# Patient Record
Sex: Female | Born: 1965
Health system: Southern US, Community
[De-identification: ages and names within clinical notes are randomized; demographics above are authoritative.]

## PROBLEM LIST (undated history)

## (undated) DIAGNOSIS — Z8041 Family history of malignant neoplasm of ovary: Secondary | ICD-10-CM

## (undated) DIAGNOSIS — Z800 Family history of malignant neoplasm of digestive organs: Secondary | ICD-10-CM

## (undated) DIAGNOSIS — M1990 Unspecified osteoarthritis, unspecified site: Secondary | ICD-10-CM

## (undated) DIAGNOSIS — Z801 Family history of malignant neoplasm of trachea, bronchus and lung: Secondary | ICD-10-CM

## (undated) DIAGNOSIS — F419 Anxiety disorder, unspecified: Secondary | ICD-10-CM

## (undated) DIAGNOSIS — Z803 Family history of malignant neoplasm of breast: Secondary | ICD-10-CM

## (undated) DIAGNOSIS — Z806 Family history of leukemia: Secondary | ICD-10-CM

## (undated) DIAGNOSIS — R7303 Prediabetes: Secondary | ICD-10-CM

## (undated) DIAGNOSIS — F319 Bipolar disorder, unspecified: Secondary | ICD-10-CM

## (undated) DIAGNOSIS — J45909 Unspecified asthma, uncomplicated: Secondary | ICD-10-CM

## (undated) HISTORY — DX: Family history of malignant neoplasm of breast: Z80.3

## (undated) HISTORY — PX: CHOLECYSTECTOMY: SHX55

## (undated) HISTORY — DX: Family history of malignant neoplasm of ovary: Z80.41

## (undated) HISTORY — DX: Family history of leukemia: Z80.6

## (undated) HISTORY — DX: Family history of malignant neoplasm of digestive organs: Z80.0

## (undated) HISTORY — DX: Family history of malignant neoplasm of trachea, bronchus and lung: Z80.1

---

## 1990-03-17 HISTORY — PX: ANKLE FRACTURE SURGERY: SHX122

## 2007-02-02 ENCOUNTER — Emergency Department: Payer: Self-pay | Admitting: Emergency Medicine

## 2007-05-04 ENCOUNTER — Ambulatory Visit: Payer: Self-pay | Admitting: Family Medicine

## 2007-05-04 LAB — HM MAMMOGRAPHY

## 2009-04-12 ENCOUNTER — Emergency Department: Payer: Self-pay | Admitting: Emergency Medicine

## 2010-03-09 ENCOUNTER — Emergency Department: Payer: Self-pay | Admitting: Emergency Medicine

## 2012-08-12 LAB — HM PAP SMEAR: HM Pap smear: NEGATIVE

## 2013-01-31 ENCOUNTER — Emergency Department: Payer: Self-pay | Admitting: Emergency Medicine

## 2013-01-31 LAB — BASIC METABOLIC PANEL
Anion Gap: 7 (ref 7–16)
BUN: 15 mg/dL (ref 7–18)
Calcium, Total: 9.1 mg/dL (ref 8.5–10.1)
Chloride: 109 mmol/L — ABNORMAL HIGH (ref 98–107)
Co2: 25 mmol/L (ref 21–32)
Creatinine: 0.78 mg/dL (ref 0.60–1.30)
EGFR (African American): >60
EGFR (Non-African Amer.): >60
Glucose: 127 mg/dL — ABNORMAL HIGH (ref 65–99)
Osmolality: 284 (ref 275–301)
Potassium: 4.4 mmol/L (ref 3.5–5.1)
Sodium: 141 mmol/L (ref 136–145)

## 2013-01-31 LAB — CBC
HCT: 40.6 % (ref 35.0–47.0)
HGB: 13.7 g/dL (ref 12.0–16.0)
MCH: 30.8 pg (ref 26.0–34.0)
MCHC: 33.8 g/dL (ref 32.0–36.0)
MCV: 91 fL (ref 80–100)
Platelet: 310 10*3/uL (ref 150–440)
RBC: 4.46 10*6/uL (ref 3.80–5.20)
RDW: 13.4 % (ref 11.5–14.5)
WBC: 11.9 10*3/uL — ABNORMAL HIGH (ref 3.6–11.0)

## 2013-02-19 ENCOUNTER — Emergency Department: Payer: Self-pay | Admitting: Emergency Medicine

## 2013-02-19 LAB — COMPREHENSIVE METABOLIC PANEL
Albumin: 3.7 g/dL (ref 3.4–5.0)
Alkaline Phosphatase: 77 U/L
Anion Gap: 3 — ABNORMAL LOW (ref 7–16)
BUN: 15 mg/dL (ref 7–18)
Bilirubin,Total: 0.3 mg/dL (ref 0.2–1.0)
Calcium, Total: 9.4 mg/dL (ref 8.5–10.1)
Chloride: 109 mmol/L — ABNORMAL HIGH (ref 98–107)
Co2: 28 mmol/L (ref 21–32)
Creatinine: 0.89 mg/dL (ref 0.60–1.30)
EGFR (African American): >60
EGFR (Non-African Amer.): >60
Glucose: 125 mg/dL — ABNORMAL HIGH (ref 65–99)
Osmolality: 282 (ref 275–301)
Potassium: 4.3 mmol/L (ref 3.5–5.1)
SGOT(AST): 22 U/L (ref 15–37)
SGPT (ALT): 24 U/L (ref 12–78)
Sodium: 140 mmol/L (ref 136–145)
Total Protein: 7.3 g/dL (ref 6.4–8.2)

## 2013-02-19 LAB — CBC WITH DIFFERENTIAL/PLATELET
Basophil #: 0.1 10*3/uL (ref 0.0–0.1)
Basophil %: 0.8 %
Eosinophil #: 0.2 10*3/uL (ref 0.0–0.7)
Eosinophil %: 2.4 %
HCT: 39.3 % (ref 35.0–47.0)
HGB: 13.3 g/dL (ref 12.0–16.0)
Lymphocyte #: 1.8 10*3/uL (ref 1.0–3.6)
Lymphocyte %: 20 %
MCH: 30.9 pg (ref 26.0–34.0)
MCHC: 33.8 g/dL (ref 32.0–36.0)
MCV: 91 fL (ref 80–100)
Monocyte #: 0.9 x10 3/mm (ref 0.2–0.9)
Monocyte %: 9.3 %
Neutrophil #: 6.2 10*3/uL (ref 1.4–6.5)
Neutrophil %: 67.5 %
Platelet: 282 10*3/uL (ref 150–440)
RBC: 4.31 10*6/uL (ref 3.80–5.20)
RDW: 13.5 % (ref 11.5–14.5)
WBC: 9.2 10*3/uL (ref 3.6–11.0)

## 2013-10-19 ENCOUNTER — Emergency Department: Payer: Self-pay | Admitting: Emergency Medicine

## 2013-10-19 LAB — CBC
HCT: 39.3 % (ref 35.0–47.0)
HGB: 13.1 g/dL (ref 12.0–16.0)
MCH: 30.8 pg (ref 26.0–34.0)
MCHC: 33.3 g/dL (ref 32.0–36.0)
MCV: 92 fL (ref 80–100)
Platelet: 258 10*3/uL (ref 150–440)
RBC: 4.26 10*6/uL (ref 3.80–5.20)
RDW: 14.1 % (ref 11.5–14.5)
WBC: 6.8 10*3/uL (ref 3.6–11.0)

## 2013-10-19 LAB — BASIC METABOLIC PANEL
Anion Gap: 8 (ref 7–16)
BUN: 12 mg/dL (ref 7–18)
Calcium, Total: 8.4 mg/dL — ABNORMAL LOW (ref 8.5–10.1)
Chloride: 106 mmol/L (ref 98–107)
Co2: 23 mmol/L (ref 21–32)
Creatinine: 0.53 mg/dL — ABNORMAL LOW (ref 0.60–1.30)
EGFR (African American): >60
EGFR (Non-African Amer.): >60
Glucose: 92 mg/dL (ref 65–99)
Osmolality: 273 (ref 275–301)
Potassium: 4.4 mmol/L (ref 3.5–5.1)
Sodium: 137 mmol/L (ref 136–145)

## 2013-10-19 LAB — D-DIMER(ARMC): D-Dimer: 368 ng/ml

## 2013-10-19 LAB — TROPONIN I: Troponin-I: <0.02 ng/mL

## 2014-02-27 ENCOUNTER — Emergency Department: Payer: Self-pay | Admitting: Emergency Medicine

## 2014-09-08 DIAGNOSIS — Z6839 Body mass index (BMI) 39.0-39.9, adult: Secondary | ICD-10-CM

## 2014-09-08 DIAGNOSIS — N920 Excessive and frequent menstruation with regular cycle: Secondary | ICD-10-CM | POA: Insufficient documentation

## 2014-09-08 DIAGNOSIS — F32A Depression, unspecified: Secondary | ICD-10-CM | POA: Insufficient documentation

## 2014-09-08 DIAGNOSIS — J45901 Unspecified asthma with (acute) exacerbation: Secondary | ICD-10-CM | POA: Insufficient documentation

## 2014-09-08 DIAGNOSIS — F419 Anxiety disorder, unspecified: Secondary | ICD-10-CM | POA: Insufficient documentation

## 2014-09-08 DIAGNOSIS — F329 Major depressive disorder, single episode, unspecified: Secondary | ICD-10-CM | POA: Insufficient documentation

## 2014-09-08 DIAGNOSIS — G43909 Migraine, unspecified, not intractable, without status migrainosus: Secondary | ICD-10-CM | POA: Insufficient documentation

## 2014-09-08 DIAGNOSIS — F319 Bipolar disorder, unspecified: Secondary | ICD-10-CM | POA: Insufficient documentation

## 2014-09-15 ENCOUNTER — Other Ambulatory Visit: Payer: Self-pay | Admitting: Family Medicine

## 2014-10-03 ENCOUNTER — Ambulatory Visit (INDEPENDENT_AMBULATORY_CARE_PROVIDER_SITE_OTHER): Payer: BLUE CROSS/BLUE SHIELD | Admitting: Family Medicine

## 2014-10-03 ENCOUNTER — Encounter: Payer: Self-pay | Admitting: Family Medicine

## 2014-10-03 VITALS — BP 120/86 | HR 80 | Temp 98.2°F | Resp 16 | Ht 70.0 in | Wt 262.0 lb

## 2014-10-03 DIAGNOSIS — G43809 Other migraine, not intractable, without status migrainosus: Secondary | ICD-10-CM

## 2014-10-03 DIAGNOSIS — J45901 Unspecified asthma with (acute) exacerbation: Secondary | ICD-10-CM | POA: Diagnosis not present

## 2014-10-03 MED ORDER — KETOROLAC TROMETHAMINE 60 MG/2ML IM SOLN: 60.0000 mg | Freq: Once | INTRAMUSCULAR | Status: AC

## 2014-10-03 MED ORDER — PREDNISONE 10 MG PO TABS: ORAL_TABLET | ORAL | Status: DC

## 2014-10-03 NOTE — Progress Notes (Signed)
Subjective:    Patient ID: Megan Jimenez, female    DOB: [birth year], 49 y.o.   MRN: [id number]  Asthma She complains of difficulty breathing, shortness of breath and wheezing. This is a chronic problem. The problem has been rapidly worsening. Associated symptoms include dyspnea on exertion, headaches (Pt reports having a migraine right now. ), myalgias, rhinorrhea, a sore throat and trouble swallowing. Pertinent negatives include no appetite change, chest pain, ear congestion, ear pain, fever, heartburn, nasal congestion, postnasal drip or sneezing. Her symptoms are alleviated by steroid inhaler and rest (She has also been using her Nebulizer twice a day for the past three days.). She reports minimal improvement on treatment. Her past medical history is significant for asthma.   Also, with migraine. Has been worsening over past few days. Medication has not helped. Shot has helped in the past.  Would like to try this.  Does not have them very frequently.  Maybe two times a month.   Patient Active Problem List   Diagnosis Date Noted  . Anxiety 09/08/2014  . Acute asthma exacerbation 09/08/2014  . 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/08/2014  . Headache, migraine 09/08/2014  . Excess, menstruation 09/08/2014  . Extreme obesity 09/08/2014   Family History  Problem Relation Age of Onset  . COPD Mother   . Hyperlipidemia Mother   . Hyperlipidemia Father   . Leukemia Father   . COPD Father   . Depression Sister     ANXIETY  . Healthy Sister    History   Social History  . Marital Status: Married    Spouse Name: N/A  . Number of Children: 2  . Years of Education: College   Occupational History  .      Full-time   Social History Main Topics  . Smoking status: Former Smoker    Quit date: 03/17/2004  . Smokeless tobacco: Never Used  . Alcohol Use: No  . Drug Use: No  . Sexual Activity: Not on file   Other Topics Concern  . Not on file   Social  History Narrative   Past Surgical History  Procedure Laterality Date  . Cholecystectomy    . Ankle fracture surgery Right 1992   No Known Allergies Previous Medications   ALPRAZOLAM (XANAX) 0.5 MG TABLET    Take by mouth.   CARBAMAZEPINE (EQUETRO) 100 MG CP12 12 HR CAPSULE    Take 400 mg by mouth daily.   FLUTICASONE-SALMETEROL (ADVAIR DISKUS) 250-50 MCG/DOSE AEPB    Inhale into the lungs.   LAMOTRIGINE (LAMICTAL) 200 MG TABLET    Take by mouth.   LURASIDONE (LATUDA) 40 MG TABS TABLET    Take by mouth.   MONTELUKAST (SINGULAIR) 10 MG TABLET    Take by mouth.   VENTOLIN HFA 108 (90 BASE) MCG/ACT INHALER    INHALE ONE PUFF BY MOUTH EVERY 4 HOURS AS NEEDED   BP 120/86 mmHg  Pulse 80  Temp(Src) 98.2 F (36.8 C) (Oral)  Resp 16  Ht 5\' 10"  (1.778 m)  Wt 262 lb (118.842 kg)  BMI 37.59 kg/m2  SpO2 97%  LMP 09/27/2014 (Exact Date)    Review of Systems  Constitutional: Positive for chills, diaphoresis and fatigue. Negative for fever, activity change, appetite change and unexpected weight change.  HENT: Positive for congestion, ear discharge, rhinorrhea, sore throat, tinnitus, trouble swallowing and voice change. Negative for ear pain, hearing loss, nosebleeds, postnasal drip, sinus pressure and sneezing.   Respiratory: Positive for  shortness of breath and wheezing. Negative for apnea, choking, chest tightness and stridor.   Cardiovascular: Positive for dyspnea on exertion. Negative for chest pain, palpitations and leg swelling.  Gastrointestinal: Positive for diarrhea (Had diarrhea yesterday and this morning. ). Negative for heartburn, nausea, vomiting, abdominal pain, constipation, blood in stool, abdominal distention, anal bleeding and rectal pain.  Musculoskeletal: Positive for myalgias.  Neurological: Positive for dizziness and headaches (Pt reports having a migraine right now. ). Negative for light-headedness.       Objective:   Physical Exam  Constitutional: She is oriented to  person, place, and time. She appears well-developed and well-nourished.  Cardiovascular: Normal rate and regular rhythm.   Pulmonary/Chest: Effort normal. She has wheezes. She has rales.  Neurological: She is alert and oriented to person, place, and time.  Psychiatric: She has a normal mood and affect.   BP 120/86 mmHg  Pulse 80  Temp(Src) 98.2 F (36.8 C) (Oral)  Resp 16  Ht 5\' 10"  (1.778 m)  Wt 262 lb (118.842 kg)  BMI 37.59 kg/m2  SpO2 97%  LMP 09/27/2014 (Exact Date)      Assessment & Plan:  1. Acute asthma exacerbation, unspecified asthma severity Worsening. Will treat.  Please call back if condition worsens or does not continue to improve.    - predniSONE (DELTASONE) 10 MG tablet; 6 po for 2 days and then 5 po for 2 days and then 4 po for 2 days and 3 po for 2 days and then 2 po for 2 days and then 1 po for 2 days.  Dispense: 42 tablet; Refill: 0  2. Other migraine without status migrainosus, not intractable Will treat as below. If become more frequent, call for more definitive treatment.  Follow up if headache does not continue to improve.  - ketorolac (TORADOL) injection 60 mg; Inject 2 mLs (60 mg total) into the muscle once.  Margarita Rana, MD

## 2014-10-24 ENCOUNTER — Encounter: Payer: Self-pay | Admitting: Family Medicine

## 2014-11-17 ENCOUNTER — Other Ambulatory Visit: Payer: Self-pay | Admitting: Family Medicine

## 2014-11-17 DIAGNOSIS — J309 Allergic rhinitis, unspecified: Secondary | ICD-10-CM | POA: Insufficient documentation

## 2015-03-16 ENCOUNTER — Encounter: Payer: BLUE CROSS/BLUE SHIELD | Admitting: Family Medicine

## 2015-04-09 ENCOUNTER — Emergency Department: Payer: BLUE CROSS/BLUE SHIELD

## 2015-04-09 ENCOUNTER — Emergency Department
Admission: EM | Admit: 2015-04-09 | Discharge: 2015-04-09 | Disposition: A | Discharge status: Home / Self Care | Payer: BLUE CROSS/BLUE SHIELD | Attending: Emergency Medicine | Admitting: Emergency Medicine

## 2015-04-09 ENCOUNTER — Encounter: Payer: Self-pay | Admitting: Emergency Medicine

## 2015-04-09 DIAGNOSIS — R202 Paresthesia of skin: Secondary | ICD-10-CM | POA: Insufficient documentation

## 2015-04-09 DIAGNOSIS — Z7951 Long term (current) use of inhaled steroids: Secondary | ICD-10-CM | POA: Insufficient documentation

## 2015-04-09 DIAGNOSIS — W108XXA Fall (on) (from) other stairs and steps, initial encounter: Secondary | ICD-10-CM | POA: Insufficient documentation

## 2015-04-09 DIAGNOSIS — S0033XA Contusion of nose, initial encounter: Secondary | ICD-10-CM | POA: Insufficient documentation

## 2015-04-09 DIAGNOSIS — Y998 Other external cause status: Secondary | ICD-10-CM | POA: Diagnosis not present

## 2015-04-09 DIAGNOSIS — Y9389 Activity, other specified: Secondary | ICD-10-CM | POA: Diagnosis not present

## 2015-04-09 DIAGNOSIS — Z79899 Other long term (current) drug therapy: Secondary | ICD-10-CM | POA: Diagnosis not present

## 2015-04-09 DIAGNOSIS — S0081XA Abrasion of other part of head, initial encounter: Secondary | ICD-10-CM | POA: Diagnosis not present

## 2015-04-09 DIAGNOSIS — S199XXA Unspecified injury of neck, initial encounter: Secondary | ICD-10-CM | POA: Diagnosis present

## 2015-04-09 DIAGNOSIS — Z87891 Personal history of nicotine dependence: Secondary | ICD-10-CM | POA: Diagnosis not present

## 2015-04-09 DIAGNOSIS — S134XXA Sprain of ligaments of cervical spine, initial encounter: Secondary | ICD-10-CM | POA: Insufficient documentation

## 2015-04-09 DIAGNOSIS — Y9289 Other specified places as the place of occurrence of the external cause: Secondary | ICD-10-CM | POA: Insufficient documentation

## 2015-04-09 DIAGNOSIS — S139XXA Sprain of joints and ligaments of unspecified parts of neck, initial encounter: Secondary | ICD-10-CM

## 2015-04-09 HISTORY — DX: Unspecified asthma, uncomplicated: J45.909

## 2015-04-09 HISTORY — DX: Bipolar disorder, unspecified: F31.9

## 2015-04-09 MED ORDER — OXYCODONE-ACETAMINOPHEN 5-325 MG PO TABS
1.0000 | ORAL_TABLET | Freq: Once | ORAL | Status: AC
  Administered 2015-04-09: 1 via ORAL
  Filled 2015-04-09: qty 1

## 2015-04-09 MED ORDER — KETOROLAC TROMETHAMINE 30 MG/ML IJ SOLN
30.0000 mg | Freq: Once | INTRAMUSCULAR | Status: AC
  Administered 2015-04-09: 30 mg via INTRAMUSCULAR
  Filled 2015-04-09: qty 1

## 2015-04-09 MED ORDER — NAPROXEN 500 MG PO TABS: 500.0000 mg | ORAL_TABLET | Freq: Two times a day (BID) | ORAL | Status: DC

## 2015-04-09 MED ORDER — OXYCODONE-ACETAMINOPHEN 5-325 MG PO TABS: 1.0000 | ORAL_TABLET | Freq: Four times a day (QID) | ORAL | Status: DC | PRN

## 2015-04-09 MED ORDER — ALPRAZOLAM 0.5 MG PO TABS
1.0000 mg | ORAL_TABLET | Freq: Three times a day (TID) | ORAL | Status: DC | PRN
  Administered 2015-04-09: 1 mg via ORAL
  Filled 2015-04-09: qty 2

## 2015-04-09 NOTE — ED Notes (Signed)
Pt says yesterday morning before church she lost her footing on her steps and fell down 10-15 concrete steps; feels like she may have lost consciousness for a brief moment; presents this am with headache, neck pain and left arm numbness; unable to lift arm; abrasion to right forehead, bridge of nose, under nose and bruising to bottom lip; pt was ambulatory upon arrival; talking in complete coherent sentences; c-collar placed immediately upon arrival

## 2015-04-09 NOTE — ED Notes (Signed)
Taken to MRI via stretcher 

## 2015-04-09 NOTE — ED Notes (Signed)
Back from x-ray and c t  Awaiting results

## 2015-04-09 NOTE — ED Provider Notes (Signed)
[facility] Emergency Department Provider Note  ____________________________________________  Time seen: 7:20 AM  I have reviewed the triage vital signs and the nursing notes.   HISTORY  Chief Complaint Fall; Neck Pain; Headache; and Numbness    HPI Megan Jimenez is a 50 y.o. female who complains of headache left arm paresthesias and left arm weakness along with neck pain after a fall yesterday. She was walking down the steps away from her apartment to go to church in the morning yesterday when she slipped on the steps of following down 10 or 15 steps. She says she was unable to brace her fall and she fell on her head. She may have lost consciousness briefly. She was able to get back up and ambulate afterwards but has pain in the right forehead and around the nose. No other significant injuries. No medical history except for asthma. She is quite sure that this was a trip and fall because her steps are very narrow and she was wearing large boots that made it hard for her to walk on the steps. She had no preceding symptoms prior to the fall.  Patient reports tetanus is up-to-date within the last 5 years  Past Medical History  Diagnosis Date  . Asthma   . Bipolar 1 disorder [facility])      Patient Active Problem List   Diagnosis Date Noted  . Allergic rhinitis 11/17/2014  . Anxiety 09/08/2014  . Acute asthma exacerbation 09/08/2014  . Affective bipolar disorder ([place]) 09/08/2014  . 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/08/2014  . Headache, migraine 09/08/2014  . Excess, menstruation 09/08/2014  . Extreme obesity ([place]) 09/08/2014     Past Surgical History  Procedure Laterality Date  . Cholecystectomy    . Ankle fracture surgery Right 1992     Current Outpatient Rx  Name  Route  Sig  Dispense  Refill  . ALPRAZolam (XANAX) 1 MG tablet   Oral   Take 1 tablet by mouth 3 (three) times daily as needed.      3   . Carbamazepine (EQUETRO) 100 MG CP12 12 hr capsule  Oral   Take 400 mg by mouth daily.         . Fluticasone-Salmeterol (ADVAIR DISKUS) 250-50 MCG/DOSE AEPB   Inhalation   Inhale 1 puff into the lungs 2 (two) times daily.          [name] lamoTRIgine (LAMICTAL) 200 MG tablet   Oral   Take 200 mg by mouth 2 (two) times daily.          [name] lithium carbonate (ESKALITH) 450 MG CR tablet   Oral   Take 1 tablet by mouth daily.      0   . montelukast (SINGULAIR) 10 MG tablet      TAKE ONE TABLET BY MOUTH ONCE DAILY   90 tablet   3   . VENTOLIN HFA 108 (90 BASE) MCG/ACT inhaler      INHALE ONE PUFF BY MOUTH EVERY 4 HOURS AS NEEDED   1 Inhaler   5   . naproxen (NAPROSYN) 500 MG tablet   Oral   Take 1 tablet (500 mg total) by mouth 2 (two) times daily with a meal.   20 tablet   0   . oxyCODONE-acetaminophen (ROXICET) 5-325 MG tablet   Oral   Take 1 tablet by mouth every 6 (six) hours as needed for severe pain.   12 tablet   0   . predniSONE (DELTASONE) 10 MG tablet  6 po for 2 days and then 5 po for 2 days and then 4 po for 2 days and 3 po for 2 days and then 2 po for 2 days and then 1 po for 2 days.   42 tablet   0      Allergies Review of patient's allergies indicates no known allergies.   Family History  Problem Relation Age of Onset  . COPD Mother   . Hyperlipidemia Mother   . Hyperlipidemia Father   . Leukemia Father   . COPD Father   . Depression Sister     ANXIETY  . Healthy Sister     Social History Social History  Substance Use Topics  . Smoking status: Former Smoker    Quit date: 03/17/2004  . Smokeless tobacco: Never Used  . Alcohol Use: No    Review of Systems  Constitutional:   No fever or chills. No weight changes Eyes:   No blurry vision or double vision.  ENT:   No sore throat. Cardiovascular:   No chest pain. Respiratory:   No dyspnea or cough. Gastrointestinal:   Negative for abdominal pain, vomiting and diarrhea.  No BRBPR or melena. Genitourinary:   Negative for dysuria,  urinary retention, bloody urine, or difficulty urinating. Musculoskeletal:   Negative for back pain. No joint swelling or pain. Skin:   Negative for rash. Neurological:   Positive for headache with left arm paresthesia and subjective weakness. Psychiatric:  No anxiety or depression.   Endocrine:  No hot/cold intolerance, changes in energy, or sleep difficulty.  10-point ROS otherwise negative.  ____________________________________________   PHYSICAL EXAM:  VITAL SIGNS: ED Triage Vitals  Enc Vitals Group     BP 04/09/15 0702 174/95 mmHg     Pulse Rate 04/09/15 0702 81     Resp 04/09/15 0702 20     Temp 04/09/15 0702 99 F (37.2 C)     Temp Source 04/09/15 0702 Oral     SpO2 04/09/15 0702 97 %     Weight 04/09/15 0702 260 lb (117.935 kg)     Height 04/09/15 0702 5\' 10"  (1.778 m)     Head Cir --      Peak Flow --      Pain Score 04/09/15 0703 8     Pain Loc --      Pain Edu? --      Excl. in [place]? --     Vital signs reviewed, nursing assessments reviewed.   Constitutional:   Alert and oriented. Well appearing and in no distress. Eyes:   No scleral icterus. No conjunctival pallor. PERRL. EOMI ENT   Head:   Normocephalic with large abrasion on the right upper forehead. There is also some contusion and swelling around the nose. TMs normal bilaterally.   Nose:   No congestion/rhinnorhea. No septal hematoma   Mouth/Throat:   MMM, no pharyngeal erythema. No peritonsillar mass. No uvula shift. No intraoral injuries   Neck:   No stridor. No SubQ emphysema. C-collar in place. Diffuse C-spine tenderness to palpation. Hematological/Lymphatic/Immunilogical:   No cervical lymphadenopathy. Cardiovascular:   RRR. Normal and symmetric distal pulses are present in all extremities. No murmurs, rubs, or gallops. Respiratory:   Normal respiratory effort without tachypnea nor retractions. Breath sounds are clear and equal bilaterally. No wheezes/rales/rhonchi. Gastrointestinal:    Soft and nontender. No distention. There is no CVA tenderness.  No rebound, rigidity, or guarding. Genitourinary:   deferred Musculoskeletal:   Nontender with normal range of  motion in all extremities. No joint effusions.  No lower extremity tenderness.  No edema. No thoracic or lumbar tenderness. Chest wall stable.. Neurologic:   Normal speech and language.  CN 2-10 normal. Intact and symmetric motor strength bilaterally. Intact active movement in all directions the patient reports diminished but intact sensation on the left upper extremity on exam Skin:    Skin is warm, dry and intact. No rash noted.  No petechiae, purpura, or bullae. Psychiatric:   Mood and affect are normal. Speech and behavior are normal. Patient exhibits appropriate insight and judgment.  ____________________________________________    LABS (pertinent positives/negatives) (all labs ordered are listed, but only abnormal results are displayed) Labs Reviewed - No data to display ____________________________________________   EKG    ____________________________________________    RADIOLOGY  CT head and cervical spine reveals no acute injuries. X-ray left shoulder unremarkable MRI cervical spine shows anterior soft tissue swelling in the C2-C4 region. I discussed these results with radiology who suggested it could be due to a small ligamentous injury.  ____________________________________________   PROCEDURES   ____________________________________________   INITIAL IMPRESSION / ASSESSMENT AND PLAN / ED COURSE  Pertinent labs & imaging results that were available during my care of the patient were reviewed by me and considered in my medical decision making (see chart for details).  Patient presents with headache neck pain and left upper extremity neurologic complaints after a mechanical fall yesterday. Due to the pattern of injury, I'm concerned that there could be an axial load injury or extension  injury of the cervical spine. Will maintain the cervical collar for now while getting a CT of the head and cervical spine and x-ray of the left shoulder. If imaging is unrevealing, may need to proceed with an MRI.  ----------------------------------------- 1:11 PM on 04/09/2015 -----------------------------------------  After MRI is a concern for occult ligamentous injury of the anterior cervical spine. I discussed this with [facility] Dr. Saintclair Halsted who recommends placing the patient in a cervical collar for now and outpatient follow-up in about 2 weeks in the neurosurgery clinic. Symptomatic management for now which we will pursue with naproxen and Percocet. I ordered the patient a fitted cervical collar and the patient will be discharged home once this is arranged.  ----------------------------------------- 2:19 PM on 04/09/2015 -----------------------------------------  Aspen collar in place. We'll discharge home.     ____________________________________________   FINAL CLINICAL IMPRESSION(S) / ED DIAGNOSES  Final diagnoses:  Acute cervical sprain, initial encounter   left upper extremity paresthesia    Carrie Mew, MD 04/09/15 1419

## 2015-04-09 NOTE — Discharge Instructions (Signed)
Cervical Sprain °A cervical sprain is an injury in the neck in which the strong, fibrous tissues (ligaments) that connect your neck bones stretch or tear. Cervical sprains can range from mild to severe. Severe cervical sprains can cause the neck vertebrae to be unstable. This can lead to damage of the spinal cord and can result in serious nervous system problems. The amount of time it takes for a cervical sprain to get better depends on the cause and extent of the injury. Most cervical sprains heal in 1 to 3 weeks. °CAUSES  °Severe cervical sprains may be caused by:  °· Contact sport injuries (such as from football, rugby, wrestling, hockey, auto racing, gymnastics, diving, martial arts, or boxing).   °· Motor vehicle collisions.   °· Whiplash injuries. This is an injury from a sudden forward and backward whipping movement of the head and neck.  °· Falls.   °Mild cervical sprains may be caused by:  °· Being in an awkward position, such as while cradling a telephone between your ear and shoulder.   °· Sitting in a chair that does not offer proper support.   °· Working at a poorly designed computer station.   °· Looking up or down for long periods of time.   °SYMPTOMS  °· Pain, soreness, stiffness, or a burning sensation in the front, back, or sides of the neck. This discomfort may develop immediately after the injury or slowly, 24 hours or more after the injury.   °· Pain or tenderness directly in the middle of the back of the neck.   °· Shoulder or upper back pain.   °· Limited ability to move the neck.   °· Headache.   °· Dizziness.   °· Weakness, numbness, or tingling in the hands or arms.   °· Muscle spasms.   °· Difficulty swallowing or chewing.   °· Tenderness and swelling of the neck.   °DIAGNOSIS  °Most of the time your health care provider can diagnose a cervical sprain by taking your history and doing a physical exam. Your health care provider will ask about previous neck injuries and any known neck  problems, such as arthritis in the neck. X-rays may be taken to find out if there are any other problems, such as with the bones of the neck. Other tests, such as a CT scan or MRI, may also be needed.  °TREATMENT  °Treatment depends on the severity of the cervical sprain. Mild sprains can be treated with rest, keeping the neck in place (immobilization), and pain medicines. Severe cervical sprains are immediately immobilized. Further treatment is done to help with pain, muscle spasms, and other symptoms and may include: °· Medicines, such as pain relievers, numbing medicines, or muscle relaxants.   °· Physical therapy. This may involve stretching exercises, strengthening exercises, and posture training. Exercises and improved posture can help stabilize the neck, strengthen muscles, and help stop symptoms from returning.   °HOME CARE INSTRUCTIONS  °· Put ice on the injured area.   °¨ Put ice in a plastic bag.   °¨ Place a towel between your skin and the bag.   °¨ Leave the ice on for 15-20 minutes, 3-4 times a day.   °· If your injury was severe, you may have been given a cervical collar to wear. A cervical collar is a two-piece collar designed to keep your neck from moving while it heals. °¨ Do not remove the collar unless instructed by your health care provider. °¨ If you have long hair, keep it outside of the collar. °¨ Ask your health care provider before making any adjustments to your collar. Minor   adjustments may be required over time to improve comfort and reduce pressure on your chin or on the back of your head.  Ifyou are allowed to remove the collar for cleaning or bathing, follow your health care provider's instructions on how to do so safely.  Keep your collar clean by wiping it with mild soap and water and drying it completely. If the collar you have been given includes removable pads, remove them every 1-2 days and hand wash them with soap and water. Allow them to air dry. They should be completely  dry before you wear them in the collar.  If you are allowed to remove the collar for cleaning and bathing, wash and dry the skin of your neck. Check your skin for irritation or sores. If you see any, tell your health care provider.  Do not drive while wearing the collar.   Only take over-the-counter or prescription medicines for pain, discomfort, or fever as directed by your health care provider.   Keep all follow-up appointments as directed by your health care provider.   Keep all physical therapy appointments as directed by your health care provider.   Make any needed adjustments to your workstation to promote good posture.   Avoid positions and activities that make your symptoms worse.   Warm up and stretch before being active to help prevent problems.  SEEK MEDICAL CARE IF:   Your pain is not controlled with medicine.   You are unable to decrease your pain medicine over time as planned.   Your activity level is not improving as expected.  SEEK IMMEDIATE MEDICAL CARE IF:   You develop any bleeding.  You develop stomach upset.  You have signs of an allergic reaction to your medicine.   Your symptoms get worse.   You develop new, unexplained symptoms.   You have numbness, tingling, weakness, or paralysis in any part of your body.  MAKE SURE YOU:   Understand these instructions.  Will watch your condition.  Will get help right away if you are not doing well or get worse.   This information is not intended to replace advice given to you by your health care provider. Make sure you discuss any questions you have with your health care provider.   Document Released: 12/29/2006 Document Revised: 03/08/2013 Document Reviewed: 09/08/2012 Elsevier Interactive Patient Education 2016 [employer]  Cervical Collar A cervical collar is a device that supports your chin and the back of your head. It is used after a severe neck injury to protect your head and neck.  It does this by restricting the movement of the top part of your spine, which is located in your neck. A cervical collar may be used when you have:  A fractured neck.  Ligament damage.  A spinal cord injury. WHAT INSTRUCTIONS SHOULD I FOLLOW?  Wear the collar for as long as your health care provider instructs.  Follow your health care provider's instructions about how to put on and take off your collar.  Do not make your collar so tight that you feel pain or it is hard for you to breathe.  Do not remove the collar unless your health care provider says it is okay. Ask your health care provider if you can remove the collar for showering or eating or to apply ice.  Do not drive a car until your health care provider says it is okay.  Keep all follow-up visits as directed by your health care provider. This is important. Any  delay in getting necessary care can keep your injury from healing properly.  Apply ice to the injured area:  Put ice in a plastic bag.  Place a towel between your skin and the bag.  Leave the ice on for 20 minutes, 2-3 times per day for the first 2 days.   This information is not intended to replace advice given to you by your health care provider. Make sure you discuss any questions you have with your health care provider.   Document Released: 11/24/2003 Document Revised: 03/24/2014 Document Reviewed: 10/10/2013 Elsevier Interactive Patient Education [employer].

## 2015-04-12 ENCOUNTER — Ambulatory Visit (INDEPENDENT_AMBULATORY_CARE_PROVIDER_SITE_OTHER): Payer: BLUE CROSS/BLUE SHIELD | Admitting: Physician Assistant

## 2015-04-12 ENCOUNTER — Encounter: Payer: Self-pay | Admitting: Physician Assistant

## 2015-04-12 VITALS — BP 160/98 | HR 96 | Temp 98.7°F | Resp 16 | Wt 268.2 lb

## 2015-04-12 DIAGNOSIS — M542 Cervicalgia: Secondary | ICD-10-CM | POA: Diagnosis not present

## 2015-04-12 DIAGNOSIS — M5010 Cervical disc disorder with radiculopathy, unspecified cervical region: Secondary | ICD-10-CM

## 2015-04-12 DIAGNOSIS — M5030 Other cervical disc degeneration, unspecified cervical region: Secondary | ICD-10-CM

## 2015-04-12 MED ORDER — OXYCODONE-ACETAMINOPHEN 5-325 MG PO TABS: 1.0000 | ORAL_TABLET | Freq: Four times a day (QID) | ORAL | Status: DC | PRN

## 2015-04-12 NOTE — Progress Notes (Signed)
Patient: Megan Jimenez Female    DOB: Oct 11, 1965   50 y.o.   MRN: [id number] Visit Date: 04/12/2015  Today's Provider: Mar Daring, PA-C   Chief Complaint  Patient presents with  . Neck Pain   Subjective:    Neck Pain  This is a new problem. The current episode started in the past 7 days (Patient fell on 04/09/15 and went to the ER, [facility]). The problem occurs constantly. The pain is associated with a fall (PAtient was seen at the ER at [facility] for Neck Pain, headache and numbness due to a fall.). The quality of the pain is described as aching and stabbing (Patient also is hurting on left arm and cannot lift. her arm). The pain is at a severity of 10/10 (Patient is in tear in the office today.). The pain is severe. The symptoms are aggravated by twisting, bending and position. The pain is same all the time. Associated symptoms include headaches and numbness (Left arm is numb with pain). Pertinent negatives include no fever, pain with swallowing or trouble swallowing. She has tried bed rest, heat and neck support (Patient also taking Naproxen and took her last pill of Oxycodone-acetaminophen at 3 am) for the symptoms. The treatment provided no relief.  Patient had a CThead and cervical spine reveals no acute injuries. X-ray left shoulder was unremarkable MRI cervical spine showed anterior soft tissue swelling in the C2-C4 region. It also showed degenerative disc disease with osteoarthritis and some disc bulging that causes some narrowing of the foramen around C3-C5. Patient is scheduled for January the 31 to see the Neurologist Dr. Saintclair Halsted for further evaluation.     No Known Allergies Previous Medications   ALPRAZOLAM (XANAX) 1 MG TABLET    Take 1 tablet by mouth 3 (three) times daily as needed.   CARBAMAZEPINE (EQUETRO) 100 MG CP12 12 HR CAPSULE    Take 400 mg by mouth daily.   FLUTICASONE-SALMETEROL (ADVAIR DISKUS) 250-50 MCG/DOSE AEPB    Inhale 1 puff into the lungs 2 (two)  times daily.    LAMOTRIGINE (LAMICTAL) 200 MG TABLET    Take 200 mg by mouth 2 (two) times daily.    LITHIUM CARBONATE (ESKALITH) 450 MG CR TABLET    Take 1 tablet by mouth daily.   MONTELUKAST (SINGULAIR) 10 MG TABLET    TAKE ONE TABLET BY MOUTH ONCE DAILY   NAPROXEN (NAPROSYN) 500 MG TABLET    Take 1 tablet (500 mg total) by mouth 2 (two) times daily with a meal.   OXYCODONE-ACETAMINOPHEN (ROXICET) 5-325 MG TABLET    Take 1 tablet by mouth every 6 (six) hours as needed for severe pain.   VENTOLIN HFA 108 (90 BASE) MCG/ACT INHALER    INHALE ONE PUFF BY MOUTH EVERY 4 HOURS AS NEEDED    Review of Systems  Constitutional: Negative for fever, chills and fatigue.  HENT: Negative for trouble swallowing.   Eyes: Negative.   Respiratory: Negative.   Cardiovascular: Negative.   Gastrointestinal: Negative.   Genitourinary: Negative.   Musculoskeletal: Positive for arthralgias (left arm) and neck pain.  Neurological: Positive for numbness (Left arm is numb with pain) and headaches. Negative for dizziness.    Social History  Substance Use Topics  . Smoking status: Former Smoker    Quit date: 03/17/2004  . Smokeless tobacco: Never Used  . Alcohol Use: No   Objective:   BP 160/98 mmHg  Pulse 96  Temp(Src) 98.7 F (37.1 C) (  Oral)  Resp 16  Wt 268 lb 3.2 oz (121.655 kg)  LMP 03/19/2015 (Approximate)  Physical Exam  Constitutional: She is oriented to person, place, and time. She appears well-developed and well-nourished. No distress.  Neck: Trachea normal. No JVD present. Spinous process tenderness and muscular tenderness present. Carotid bruit is not present. Rigidity present. Decreased range of motion present. No tracheal deviation, no edema and no erythema present. No thyroid mass and no thyromegaly present.  Cardiovascular: Normal rate, regular rhythm and normal heart sounds.  Exam reveals no gallop and no friction rub.   No murmur heard. Pulmonary/Chest: Effort normal and breath sounds  normal. No respiratory distress. She has no wheezes. She has no rales.  Musculoskeletal:       Left shoulder: She exhibits decreased range of motion and tenderness. She exhibits no bony tenderness.  Cannot raise left arm any higher than 45  Lymphadenopathy:    She has no cervical adenopathy.  Neurological: She is alert and oriented to person, place, and time. She displays no atrophy. A sensory deficit (feels different on left arm) is present. No cranial nerve deficit. Coordination and gait normal.  Skin: She is not diaphoretic.  Vitals reviewed.       Assessment & Plan:     1. Cervicalgia Cervicalgia with ligamentous strain and soft tissue edema in C2-C5. Degenerative disc noted through cervical spine with disc bulges causing a narrowed foramen to the left. Increased weakness and slight decreased sensation in left upper extremity. Limited range of motion in left shoulder. I will give her pain medication as below. She is to follow-up with the neurosurgeon on January 31. She is to call the office if symptoms worsen in the meantime. I did discuss with her that once she is treated for her cervical spine if she still has symptoms in her left shoulder it may be beneficial to obtain an MRI of the left shoulder to make sure there is no injury to the rotator cuff. She agrees with this treatment plan. We will see her back following her evaluation by neurosurgery. - oxyCODONE-acetaminophen (ROXICET) 5-325 MG tablet; Take 1 tablet by mouth every 6 (six) hours as needed for severe pain.  Dispense: 60 tablet; Refill: 0  2. Degenerative cervical disc See above medical treatment plan. - oxyCODONE-acetaminophen (ROXICET) 5-325 MG tablet; Take 1 tablet by mouth every 6 (six) hours as needed for severe pain.  Dispense: 60 tablet; Refill: 0  3. Cervical disc disorder with radiculopathy See above medical treatment plan. - oxyCODONE-acetaminophen (ROXICET) 5-325 MG tablet; Take 1 tablet by mouth every 6 (six)  hours as needed for severe pain.  Dispense: 60 tablet; Refill: 0       Mar Daring, PA-C  [facility]

## 2015-04-12 NOTE — Patient Instructions (Signed)
Degenerative Disk Disease  Degenerative disk disease is a condition caused by the changes that occur in spinal disks as you grow older. Spinal disks are soft and compressible disks located between the bones of your spine (vertebrae). These disks act like shock absorbers. Degenerative disk disease can affect the whole spine. However, the neck and lower back are most commonly affected. Many changes can occur in the spinal disks with aging, such as:  · The spinal disks may dry and shrink.  · Small tears may occur in the tough, outer covering of the disk (annulus).  · The disk space may become smaller due to loss of water.  · Abnormal growths in the bone (spurs) may occur. This can put pressure on the nerve roots exiting the spinal canal, causing pain.  · The spinal canal may become narrowed.  RISK FACTORS   · Being overweight.  · Having a family history of degenerative disk disease.  · Smoking.  · There is increased risk if you are doing heavy lifting or have a sudden injury.  SIGNS AND SYMPTOMS   Symptoms vary from person to person and may include:  · Pain that varies in intensity. Some people have no pain, while others have severe pain. The location of the pain depends on the part of your backbone that is affected.  ¨ You will have neck or arm pain if a disk in the neck area is affected.  ¨ You will have pain in your back, buttocks, or legs if a disk in the lower back is affected.  · Pain that becomes worse while bending, reaching up, or with twisting movements.  · Pain that may start gradually and then get worse as time passes. It may also start after a major or minor injury.  · Numbness or tingling in the arms or legs.  DIAGNOSIS   Your health care provider will ask you about your symptoms and about activities or habits that may cause the pain. He or she may also ask about any injuries, diseases, or treatments you have had. Your health care provider will examine you to check for the range of movement that is  possible in the affected area, to check for strength in your extremities, and to check for sensation in the areas of the arms and legs supplied by different nerve roots. You may also have:   · An X-ray of the spine.  · Other imaging tests, such as MRI.  TREATMENT   Your health care provider will advise you on the best plan for treatment. Treatment may include:  · Medicines.  · Rehabilitation exercises.  HOME CARE INSTRUCTIONS   · Follow proper lifting and walking techniques as advised by your health care provider.  · Maintain good posture.  · Exercise regularly as advised by your health care provider.  · Perform relaxation exercises.  · Change your sitting, standing, and sleeping habits as advised by your health care provider.  · Change positions frequently.  · Lose weight or maintain a healthy weight as advised by your health care provider.  · Do not use any tobacco products, including cigarettes, chewing tobacco, or electronic cigarettes. If you need help quitting, ask your health care provider.  · Wear supportive footwear.  · Take medicines only as directed by your health care provider.  SEEK MEDICAL CARE IF:   · Your pain does not go away within 1-4 weeks.  · You have significant appetite or weight loss.  SEEK IMMEDIATE MEDICAL CARE IF:   ·   Your pain is severe.  · You notice weakness in your arms, hands, or legs.  · You begin to lose control of your bladder or bowel movements.  · You have fevers or night sweats.  MAKE SURE YOU:   · Understand these instructions.  · Will watch your condition.  · Will get help right away if you are not doing well or get worse.     This information is not intended to replace advice given to you by your health care provider. Make sure you discuss any questions you have with your health care provider.     Document Released: 12/29/2006 Document Revised: 03/24/2014 Document Reviewed: 07/05/2013  Elsevier Interactive Patient Education ©2016 [employer]

## 2015-04-23 ENCOUNTER — Other Ambulatory Visit: Payer: Self-pay | Admitting: Neurosurgery

## 2015-04-23 ENCOUNTER — Other Ambulatory Visit: Payer: Self-pay | Admitting: Physician Assistant

## 2015-04-23 ENCOUNTER — Other Ambulatory Visit (HOSPITAL_COMMUNITY): Payer: Self-pay | Admitting: Neurosurgery

## 2015-04-23 DIAGNOSIS — M25512 Pain in left shoulder: Secondary | ICD-10-CM

## 2015-04-23 DIAGNOSIS — M5030 Other cervical disc degeneration, unspecified cervical region: Secondary | ICD-10-CM

## 2015-04-23 DIAGNOSIS — M542 Cervicalgia: Secondary | ICD-10-CM

## 2015-04-23 DIAGNOSIS — M5010 Cervical disc disorder with radiculopathy, unspecified cervical region: Secondary | ICD-10-CM

## 2015-04-23 NOTE — Telephone Encounter (Signed)
Pt contacted office for refill request on the following medications: oxyCODONE-acetaminophen (ROXICET) 5-325 MG tablet. Thanks TNP

## 2015-04-23 NOTE — Telephone Encounter (Signed)
Can we please see if she went to neurosurgery appt on Jan 31 and see what was done prior to refill? Thanks.

## 2015-04-24 NOTE — Telephone Encounter (Signed)
Patient said she went to the neurosurgery appointment. They told her she needed another MRI.They were waiting on the insurance to approve the other MRI which it got approved this last Friday Feb. 03. Patient is scheduled to get the MRI tomorrow here at [facility]. She has a follow-up appointment with the neurosurgeon for further plan and to discuss the MRI results.Patient asked Dr, Saintclair Halsted to prescribed the pain medication but was told that since [place] prescribed he prefers for her to get it from [place].  Thanks,  -Joseline

## 2015-04-25 ENCOUNTER — Ambulatory Visit
Admission: RE | Admit: 2015-04-25 | Discharge: 2015-04-25 | Disposition: A | Discharge status: Home / Self Care | Payer: BLUE CROSS/BLUE SHIELD | Source: Ambulatory Visit | Attending: Neurosurgery | Admitting: Neurosurgery

## 2015-04-25 DIAGNOSIS — M25512 Pain in left shoulder: Secondary | ICD-10-CM | POA: Insufficient documentation

## 2015-04-25 MED ORDER — OXYCODONE-ACETAMINOPHEN 5-325 MG PO TABS: 1.0000 | ORAL_TABLET | Freq: Four times a day (QID) | ORAL | Status: DC | PRN

## 2015-05-04 ENCOUNTER — Telehealth: Payer: Self-pay | Admitting: Family Medicine

## 2015-05-04 DIAGNOSIS — M542 Cervicalgia: Secondary | ICD-10-CM

## 2015-05-04 DIAGNOSIS — M5010 Cervical disc disorder with radiculopathy, unspecified cervical region: Secondary | ICD-10-CM

## 2015-05-04 DIAGNOSIS — M5030 Other cervical disc degeneration, unspecified cervical region: Secondary | ICD-10-CM

## 2015-05-04 MED ORDER — OXYCODONE-ACETAMINOPHEN 5-325 MG PO TABS: 1.0000 | ORAL_TABLET | Freq: Four times a day (QID) | ORAL | Status: DC | PRN

## 2015-05-04 NOTE — Telephone Encounter (Signed)
Patient advised as directed below. She said her pain is so severe that she had to double up on the med. She has been taking Ibuprofen but it doesn't help at all. She has enough until Monday. But she needs to help her pain. Please advise.  Thanks,  -Joseline

## 2015-05-04 NOTE — Telephone Encounter (Signed)
Pt needs refill oxyCODONE-acetaminophen (ROXICET) 5-325 MG tablet 04/25/15 -- Mar Daring, PA-C Take 1 tablet by mouth every 6 (six) hours as needed for severe pain  Her call back is [phone]  Thanks Con Memos

## 2015-05-04 NOTE — Telephone Encounter (Signed)
Pt returned Joseline's call. Thanks TNP °

## 2015-05-04 NOTE — Telephone Encounter (Signed)
She needs to not double up on the pain medication as she will be getting too much acetaminophen.  Plus if her pain is still that severe she should see pain management as well, especially if Dr. Saintclair Halsted has not been able to help her pain.  I will refill but her insurance may not pay for it until the 23rd per how it was dosed. She would have to pay out of pocket.  I am going to put in an order for pain management as well.

## 2015-05-04 NOTE — Telephone Encounter (Signed)
LMTCB  Thanks,  -Joseline 

## 2015-05-04 NOTE — Telephone Encounter (Signed)
Patient advised as directed below. Rx put up front for pick.  Thanks,  -Joseline

## 2015-05-04 NOTE — Telephone Encounter (Signed)
It is too soon for refill at this time. It was last filled on the 8th.  This should have lasted as how prescribed for at least 15 days. She should have at least one week left per my calculations. I have that it can be refilled on 2/23 or 2/24.

## 2015-05-07 ENCOUNTER — Encounter: Payer: BLUE CROSS/BLUE SHIELD | Admitting: Family Medicine

## 2015-05-30 ENCOUNTER — Telehealth: Payer: Self-pay | Admitting: *Deleted

## 2015-05-30 NOTE — Telephone Encounter (Signed)
lm informed the pt that i was calling to get her scheduled. i asked her to please return my call...td

## 2015-05-31 ENCOUNTER — Telehealth: Payer: Self-pay | Admitting: Physician Assistant

## 2015-05-31 DIAGNOSIS — M542 Cervicalgia: Secondary | ICD-10-CM

## 2015-05-31 DIAGNOSIS — M5030 Other cervical disc degeneration, unspecified cervical region: Secondary | ICD-10-CM

## 2015-05-31 DIAGNOSIS — M5010 Cervical disc disorder with radiculopathy, unspecified cervical region: Secondary | ICD-10-CM

## 2015-05-31 MED ORDER — OXYCODONE-ACETAMINOPHEN 5-325 MG PO TABS: 1.0000 | ORAL_TABLET | Freq: Four times a day (QID) | ORAL | Status: DC | PRN

## 2015-05-31 NOTE — Telephone Encounter (Signed)
Rx printed and placed up front

## 2015-05-31 NOTE — Telephone Encounter (Signed)
Pt states she contacted the Pain [facility] and they were not able to see her until 06/19/15.  Pt is requesting a refill of pain medication oxyCODONE-acetaminophen (ROXICET) 5-325 MG tablet

## 2015-06-19 ENCOUNTER — Ambulatory Visit: Payer: Self-pay | Admitting: Anesthesiology

## 2015-06-29 ENCOUNTER — Telehealth: Payer: Self-pay

## 2015-06-29 DIAGNOSIS — M5010 Cervical disc disorder with radiculopathy, unspecified cervical region: Secondary | ICD-10-CM

## 2015-06-29 DIAGNOSIS — M542 Cervicalgia: Secondary | ICD-10-CM

## 2015-06-29 DIAGNOSIS — M5030 Other cervical disc degeneration, unspecified cervical region: Secondary | ICD-10-CM

## 2015-06-29 MED ORDER — OXYCODONE-ACETAMINOPHEN 5-325 MG PO TABS: 1.0000 | ORAL_TABLET | Freq: Four times a day (QID) | ORAL | Status: DC | PRN

## 2015-06-29 NOTE — Telephone Encounter (Signed)
Pt advised-aa 

## 2015-06-29 NOTE — Telephone Encounter (Signed)
Patient called and states that she needs Oxycodone refilled. She is seen pain clinic on April 21st and suppose to get spinal injection. She has been taking 2 to 3 tablets daily and has went some days without taking it but this week for some reason she has been hurting a lot and they are leaving to go to [facility] this evening and her husband did not want her to not have medication to help with pain with traveling and all. She states she is still seen neurologist and last time she saw him he did not want to give her this RX refill and having 2 doctors filling it. She is assuming pain clinic will take over refills at that time. Please review and let patient know when this is ready. I advised patient that we are closing early today and per Judson Roch we are closing at 3 pm.-aa She has 1 tablet left.

## 2015-06-29 NOTE — Telephone Encounter (Signed)
Med refilled to get her to 07/06/15 for pain management appointment.

## 2015-07-06 ENCOUNTER — Ambulatory Visit: Payer: Self-pay | Admitting: Anesthesiology

## 2015-10-25 ENCOUNTER — Ambulatory Visit (INDEPENDENT_AMBULATORY_CARE_PROVIDER_SITE_OTHER): Payer: BLUE CROSS/BLUE SHIELD | Admitting: Physician Assistant

## 2015-10-25 ENCOUNTER — Telehealth: Payer: Self-pay

## 2015-10-25 ENCOUNTER — Encounter: Payer: Self-pay | Admitting: Physician Assistant

## 2015-10-25 VITALS — BP 140/88 | HR 78 | Temp 98.3°F | Resp 16 | Ht 68.0 in | Wt 268.0 lb

## 2015-10-25 DIAGNOSIS — N921 Excessive and frequent menstruation with irregular cycle: Secondary | ICD-10-CM

## 2015-10-25 DIAGNOSIS — Z23 Encounter for immunization: Secondary | ICD-10-CM

## 2015-10-25 DIAGNOSIS — D509 Iron deficiency anemia, unspecified: Secondary | ICD-10-CM

## 2015-10-25 DIAGNOSIS — Z136 Encounter for screening for cardiovascular disorders: Secondary | ICD-10-CM

## 2015-10-25 DIAGNOSIS — Z833 Family history of diabetes mellitus: Secondary | ICD-10-CM | POA: Diagnosis not present

## 2015-10-25 DIAGNOSIS — Z1239 Encounter for other screening for malignant neoplasm of breast: Secondary | ICD-10-CM | POA: Diagnosis not present

## 2015-10-25 DIAGNOSIS — Z124 Encounter for screening for malignant neoplasm of cervix: Secondary | ICD-10-CM

## 2015-10-25 DIAGNOSIS — Z1211 Encounter for screening for malignant neoplasm of colon: Secondary | ICD-10-CM | POA: Diagnosis not present

## 2015-10-25 DIAGNOSIS — Z13220 Encounter for screening for lipoid disorders: Secondary | ICD-10-CM | POA: Diagnosis not present

## 2015-10-25 DIAGNOSIS — E559 Vitamin D deficiency, unspecified: Secondary | ICD-10-CM | POA: Diagnosis not present

## 2015-10-25 DIAGNOSIS — Z0000 Encounter for general adult medical examination without abnormal findings: Secondary | ICD-10-CM

## 2015-10-25 NOTE — Telephone Encounter (Signed)
These orders were placed during the visit I just forgot to mention them to her. She may need the number for [facility] to schedule because I am not sure if I gave her a card or not.

## 2015-10-25 NOTE — Telephone Encounter (Signed)
Pt requesting mammogram order and GI referral for colonoscopy. Thanks. Renaldo Fiddler, CMA

## 2015-10-25 NOTE — Progress Notes (Signed)
Patient: Megan Jimenez, Female    DOB: [birth year], 50 y.o.   MRN: [id number] Visit Date: 10/25/2015  Today's Provider: Mar Daring, PA-C   Chief Complaint  Patient presents with  . Annual Exam  . Menstrual Problem   Subjective:    Annual physical exam Megan Jimenez is a 50 y.o. female who presents today for health maintenance and complete physical. She feels fairly well. She reports exercising none. She reports she is sleeping well.  Last PCP:08/12/2012 Pap Smear: 05/29/214 Negative-HPV-Neg. Td: 03/16/199 EKG: 08/12/2012  ----------------------------------------------------------------- Menstrual Problem: She reports that she has been bleeding on and off. She reports that now it has been for 2 months-present non stop. She feels fatigue,dizzy,lightheadedness, headache, weak, ankles swelling, lots of cramping,activity change, sweating at night. She feels this is all related to her heavy bleeding. She takes OTC iron supplement daily and Vitamin D.  Review of Systems  Constitutional: Positive for activity change, diaphoresis and fatigue.       Crying and Irritability  HENT: Negative for congestion, sinus pressure, sore throat, tinnitus and trouble swallowing.   Eyes: Negative for visual disturbance.  Respiratory: Positive for cough, shortness of breath and wheezing. Negative for chest tightness.   Cardiovascular: Positive for leg swelling. Negative for chest pain and palpitations.  Gastrointestinal: Negative for abdominal pain, blood in stool and nausea.  Endocrine: Negative.   Genitourinary: Positive for menstrual problem and vaginal bleeding. Negative for dysuria, flank pain, genital sores, vaginal discharge and vaginal pain.  Musculoskeletal: Negative for arthralgias and back pain.  Allergic/Immunologic: Negative.   Neurological: Positive for dizziness, weakness, light-headedness and headaches.  Hematological: Negative.   Psychiatric/Behavioral: Positive  for agitation and decreased concentration. Negative for self-injury, sleep disturbance and suicidal ideas. The patient is nervous/anxious.     Social History      She  reports that she quit smoking about 11 years ago. She has never used smokeless tobacco. She reports that she does not drink alcohol or use drugs.       Social History   Social History  . Marital status: Married    Spouse name: N/A  . Number of children: 2  . Years of education: College   Occupational History  .      Full-time   Social History Main Topics  . Smoking status: Former Smoker    Quit date: 03/17/2004  . Smokeless tobacco: Never Used  . Alcohol use No  . Drug use: No  . Sexual activity: Not Currently   Other Topics Concern  . None   Social History Narrative  . None    Past Medical History:  Diagnosis Date  . Asthma   . Bipolar 1 disorder [facility])      Patient Active Problem List   Diagnosis Date Noted  . Allergic rhinitis 11/17/2014  . Anxiety 09/08/2014  . Acute asthma exacerbation 09/08/2014  . Affective bipolar disorder ([place]) 09/08/2014  . 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/08/2014  . Headache, migraine 09/08/2014  . Excess, menstruation 09/08/2014  . Extreme obesity ([place]) 09/08/2014    Past Surgical History:  Procedure Laterality Date  . ANKLE FRACTURE SURGERY Right 1992  . CHOLECYSTECTOMY      Family History        Family Status  Relation Status  . Mother Alive  . Father Deceased at age 54   LEUKEMIA  . Sister Alive  . Sister Alive        Her family history includes COPD in  her father and mother; Depression in her sister; Healthy in her sister; Hyperlipidemia in her father and mother; Leukemia in her father.    No Known Allergies  Current Meds  Medication Sig  . ALPRAZolam (XANAX) 1 MG tablet Take 1 tablet by mouth 3 (three) times daily as needed.  . Fluticasone-Salmeterol (ADVAIR DISKUS) 250-50 MCG/DOSE AEPB Inhale 1 puff into the lungs 2 (two) times daily.   [name] lamoTRIgine  (LAMICTAL) 200 MG tablet Take 200 mg by mouth 2 (two) times daily.   [name] lithium carbonate (ESKALITH) 450 MG CR tablet Take 1 tablet by mouth daily.  . montelukast (SINGULAIR) 10 MG tablet TAKE ONE TABLET BY MOUTH ONCE DAILY  . VENTOLIN HFA 108 (90 BASE) MCG/ACT inhaler INHALE ONE PUFF BY MOUTH EVERY 4 HOURS AS NEEDED  . Vitamin D, Ergocalciferol, (DRISDOL) 50000 units CAPS capsule     Patient Care Team: Margarita Rana, MD as PCP - General (Family Medicine)     Objective:   Vitals: BP 140/88 (BP Location: Left Arm, Patient Position: Sitting, Cuff Size: Normal)   Pulse 78   Temp 98.3 F (36.8 C) (Oral)   Resp 16   Ht 5\' 8"  (1.727 m)   Wt 268 lb (121.6 kg)   BMI 40.75 kg/m    Physical Exam  Constitutional: She is oriented to person, place, and time. She appears well-developed and well-nourished. No distress.  HENT:  Head: Normocephalic and atraumatic.  Right Ear: Hearing, tympanic membrane, external ear and ear canal normal.  Left Ear: Hearing, tympanic membrane, external ear and ear canal normal.  Nose: Nose normal.  Mouth/Throat: Uvula is midline, oropharynx is clear and moist and mucous membranes are normal. No oropharyngeal exudate.  Eyes: Conjunctivae and EOM are normal. Pupils are equal, round, and reactive to light. Right eye exhibits no discharge. Left eye exhibits no discharge. No scleral icterus.  Neck: Normal range of motion. Neck supple. No JVD present. Carotid bruit is not present. No tracheal deviation present. No thyromegaly present.  Cardiovascular: Normal rate, regular rhythm, normal heart sounds and intact distal pulses.  Exam reveals no gallop and no friction rub.   No murmur heard. Pulmonary/Chest: Effort normal. No respiratory distress. She has wheezes (mild throughout). She has no rales. She exhibits no tenderness. Right breast exhibits no inverted nipple, no mass, no nipple discharge, no skin change and no tenderness. Left breast exhibits no inverted nipple, no  mass, no nipple discharge, no skin change and no tenderness. Breasts are symmetrical.  Abdominal: Soft. Bowel sounds are normal. She exhibits no distension and no mass. There is no tenderness. There is no rebound and no guarding. Hernia confirmed negative in the right inguinal area and confirmed negative in the left inguinal area.  Genitourinary: Rectum normal, vagina normal and uterus normal. No breast swelling, tenderness, discharge or bleeding. Pelvic exam was performed with patient supine. There is no rash, tenderness, lesion or injury on the right labia. There is no rash, tenderness, lesion or injury on the left labia. Right adnexum displays no mass, no tenderness and no fullness. Left adnexum displays no mass, no tenderness and no fullness. No erythema, tenderness or bleeding in the vagina. No signs of injury around the vagina. No vaginal discharge found.  Genitourinary Comments: Was unable to visualize cervix due to bleeding  Musculoskeletal: Normal range of motion. She exhibits no edema or tenderness.  Lymphadenopathy:    She has no cervical adenopathy.       Right: No inguinal adenopathy present.  Left: No inguinal adenopathy present.  Neurological: She is alert and oriented to person, place, and time. She has normal reflexes. No cranial nerve deficit. Coordination normal.  Skin: Skin is warm and dry. No rash noted. She is not diaphoretic.  Psychiatric: She has a normal mood and affect. Her behavior is normal. Judgment and thought content normal.  Vitals reviewed.  Depression Screen No flowsheet data found.  Assessment & Plan:     Routine Health Maintenance and Physical Exam  Exercise Activities and Dietary recommendations Goals    None      Immunization History  Administered Date(s) Administered  . Td 05/30/1997    Health Maintenance  Topic Date Due  . HIV Screening  09/27/1979  . TETANUS/TDAP  05/31/2007  . MAMMOGRAM  09/27/2014  . COLONOSCOPY  09/27/2014  . PAP  SMEAR  08/13/2015  . INFLUENZA VACCINE  10/16/2015      Discussed health benefits of physical activity, and encouraged her to engage in regular exercise appropriate for her age and condition.   1. Annual physical exam Will check labs as below and f/u pending lab results. She is to call the office in the meantime if she has any acute issue, questions or concerns. - CBC with Differential/Platelet - Comprehensive metabolic panel - TSH  2. Menorrhagia with irregular cycle Will check labs as below and f/u pending results. Discussed referral to Ob/Gyn but she wants to await labs and pap results. May consider [place] next with referral. - [place] - [facility]  3. Family history of diabetes mellitus (DM) Will check labs as below and f/u pending results. - Hemoglobin A1c  4. Encounter for lipid screening for cardiovascular disease Will check labs as below and f/u pending results. - Lipid panel  5. Breast cancer screening Breast exam today was normal. There is no family history of breast cancer. She does perform regular self breast exams. Mammogram was ordered as below. Information for [facility] Breast clinic was given to patient so she may schedule her mammogram at her convenience. - MM DIGITAL SCREENING BILATERAL; Future  6. Need for pneumococcal vaccine Pneumococcal 23 given due to asthmatic bronchitis history. Tolerated well.  - Pneumococcal polysaccharide vaccine 23-valent greater than or equal to 2yo subcutaneous/IM  7. Need for Tdap vaccination Tdap vaccine given without complications. - Tdap vaccine greater than or equal to 7yo IM  8. Cervical cancer screening Pap collected today. Will send as below and f/u pending results. - Pap IG w/ reflex to HPV when ASC-U  9. Vitamin D deficiency Will check labs as below and f/u pending results. - Vitamin D (25 hydroxy)  10. Iron deficiency anemia Will check labs as below and f/u pending results. - Iron and TIBC  11. Colon cancer screening -  Ambulatory referral to Gastroenterology   --------------------------------------------------------------------    Mar Daring, PA-C  Portal

## 2015-10-25 NOTE — Patient Instructions (Signed)
Health Maintenance, Female Adopting a healthy lifestyle and getting preventive care can go a long way to promote health and wellness. Talk with your health care provider about what schedule of regular examinations is right for you. This is a good chance for you to check in with your provider about disease prevention and staying healthy. In between checkups, there are plenty of things you can do on your own. Experts have done a lot of research about which lifestyle changes and preventive measures are most likely to keep you healthy. Ask your health care provider for more information. WEIGHT AND DIET  Eat a healthy diet  Be sure to include plenty of vegetables, fruits, low-fat dairy products, and lean protein.  Do not eat a lot of foods high in solid fats, added sugars, or salt.  Get regular exercise. This is one of the most important things you can do for your health.  Most adults should exercise for at least 150 minutes each week. The exercise should increase your heart rate and make you sweat (moderate-intensity exercise).  Most adults should also do strengthening exercises at least twice a week. This is in addition to the moderate-intensity exercise.  Maintain a healthy weight  Body mass index (BMI) is a measurement that can be used to identify possible weight problems. It estimates body fat based on height and weight. Your health care provider can help determine your BMI and help you achieve or maintain a healthy weight.  For females 93 years of age and older:   A BMI below 18.5 is considered underweight.  A BMI of 18.5 to 24.9 is normal.  A BMI of 25 to 29.9 is considered overweight.  A BMI of 30 and above is considered obese.  Watch levels of cholesterol and blood lipids  You should start having your blood tested for lipids and cholesterol at 51 years of age, then have this test every 5 years.  You may need to have your cholesterol levels checked more often if:  Your lipid  or cholesterol levels are high.  You are older than 51 years of age.  You are at high risk for heart disease.  CANCER SCREENING   Lung Cancer  Lung cancer screening is recommended for adults 73-72 years old who are at high risk for lung cancer because of a history of smoking.  A yearly low-dose CT scan of the lungs is recommended for people who:  Currently smoke.  Have quit within the past 15 years.  Have at least a 30-pack-year history of smoking. A pack year is smoking an average of one pack of cigarettes a day for 1 year.  Yearly screening should continue until it has been 15 years since you quit.  Yearly screening should stop if you develop a health problem that would prevent you from having lung cancer treatment.  Breast Cancer  Practice breast self-awareness. This means understanding how your breasts normally appear and feel.  It also means doing regular breast self-exams. Let your health care provider know about any changes, no matter how small.  If you are in your 20s or 30s, you should have a clinical breast exam (CBE) by a health care provider every 1-3 years as part of a regular health exam.  If you are 26 or older, have a CBE every year. Also consider having a breast X-ray (mammogram) every year.  If you have a family history of breast cancer, talk to your health care provider about genetic screening.  If you  are at high risk for breast cancer, talk to your health care provider about having an MRI and a mammogram every year.  Breast cancer gene (BRCA) assessment is recommended for women who have family members with BRCA-related cancers. BRCA-related cancers include:  Breast.  Ovarian.  Tubal.  Peritoneal cancers.  Results of the assessment will determine the need for genetic counseling and BRCA1 and BRCA2 testing. Cervical Cancer Your health care provider may recommend that you be screened regularly for cancer of the pelvic organs (ovaries, uterus, and  vagina). This screening involves a pelvic examination, including checking for microscopic changes to the surface of your cervix (Pap test). You may be encouraged to have this screening done every 3 years, beginning at age 60.  For women ages 25-65, health care providers may recommend pelvic exams and Pap testing every 3 years, or they may recommend the Pap and pelvic exam, combined with testing for human papilloma virus (HPV), every 5 years. Some types of HPV increase your risk of cervical cancer. Testing for HPV may also be done on women of any age with unclear Pap test results.  Other health care providers may not recommend any screening for nonpregnant women who are considered low risk for pelvic cancer and who do not have symptoms. Ask your health care provider if a screening pelvic exam is right for you.  If you have had past treatment for cervical cancer or a condition that could lead to cancer, you need Pap tests and screening for cancer for at least 20 years after your treatment. If Pap tests have been discontinued, your risk factors (such as having a new sexual partner) need to be reassessed to determine if screening should resume. Some women have medical problems that increase the chance of getting cervical cancer. In these cases, your health care provider may recommend more frequent screening and Pap tests. Colorectal Cancer  This type of cancer can be detected and often prevented.  Routine colorectal cancer screening usually begins at 51 years of age and continues through 51 years of age.  Your health care provider may recommend screening at an earlier age if you have risk factors for colon cancer.  Your health care provider may also recommend using home test kits to check for hidden blood in the stool.  A small camera at the end of a tube can be used to examine your colon directly (sigmoidoscopy or colonoscopy). This is done to check for the earliest forms of colorectal  cancer.  Routine screening usually begins at age 37.  Direct examination of the colon should be repeated every 5-10 years through 51 years of age. However, you may need to be screened more often if early forms of precancerous polyps or small growths are found. Skin Cancer  Check your skin from head to toe regularly.  Tell your health care provider about any new moles or changes in moles, especially if there is a change in a mole's shape or color.  Also tell your health care provider if you have a mole that is larger than the size of a pencil eraser.  Always use sunscreen. Apply sunscreen liberally and repeatedly throughout the day.  Protect yourself by wearing long sleeves, pants, a wide-brimmed hat, and sunglasses whenever you are outside. HEART DISEASE, DIABETES, AND HIGH BLOOD PRESSURE   High blood pressure causes heart disease and increases the risk of stroke. High blood pressure is more likely to develop in:  People who have blood pressure in the high end  of the normal range (130-139/85-89 mm Hg).  People who are overweight or obese.  People who are African American.  If you are 38-23 years of age, have your blood pressure checked every 3-5 years. If you are 61 years of age or older, have your blood pressure checked every year. You should have your blood pressure measured twice--once when you are at a hospital or clinic, and once when you are not at a hospital or clinic. Record the average of the two measurements. To check your blood pressure when you are not at a hospital or clinic, you can use:  An automated blood pressure machine at a pharmacy.  A home blood pressure monitor.  If you are between 45 years and 39 years old, ask your health care provider if you should take aspirin to prevent strokes.  Have regular diabetes screenings. This involves taking a blood sample to check your fasting blood sugar level.  If you are at a normal weight and have a low risk for diabetes,  have this test once every three years after 51 years of age.  If you are overweight and have a high risk for diabetes, consider being tested at a younger age or more often. PREVENTING INFECTION  Hepatitis B  If you have a higher risk for hepatitis B, you should be screened for this virus. You are considered at high risk for hepatitis B if:  You were born in a country where hepatitis B is common. Ask your health care provider which countries are considered high risk.  Your parents were born in a high-risk country, and you have not been immunized against hepatitis B (hepatitis B vaccine).  You have HIV or AIDS.  You use needles to inject street drugs.  You live with someone who has hepatitis B.  You have had sex with someone who has hepatitis B.  You get hemodialysis treatment.  You take certain medicines for conditions, including cancer, organ transplantation, and autoimmune conditions. Hepatitis C  Blood testing is recommended for:  Everyone born from [birth year] through 1966.  Anyone with known risk factors for hepatitis C. Sexually transmitted infections (STIs)  You should be screened for sexually transmitted infections (STIs) including gonorrhea and chlamydia if:  You are sexually active and are younger than 51 years of age.  You are older than 51 years of age and your health care provider tells you that you are at risk for this type of infection.  Your sexual activity has changed since you were last screened and you are at an increased risk for chlamydia or gonorrhea. Ask your health care provider if you are at risk.  If you do not have HIV, but are at risk, it may be recommended that you take a prescription medicine daily to prevent HIV infection. This is called pre-exposure prophylaxis (PrEP). You are considered at risk if:  You are sexually active and do not regularly use condoms or know the HIV status of your partner(s).  You take drugs by injection.  You are sexually  active with a partner who has HIV. Talk with your health care provider about whether you are at high risk of being infected with HIV. If you choose to begin PrEP, you should first be tested for HIV. You should then be tested every 3 months for as long as you are taking PrEP.  PREGNANCY   If you are premenopausal and you may become pregnant, ask your health care provider about preconception counseling.  If you may  become pregnant, take 400 to 800 micrograms (mcg) of folic acid every day.  If you want to prevent pregnancy, talk to your health care provider about birth control (contraception). OSTEOPOROSIS AND MENOPAUSE   Osteoporosis is a disease in which the bones lose minerals and strength with aging. This can result in serious bone fractures. Your risk for osteoporosis can be identified using a bone density scan.  If you are 82 years of age or older, or if you are at risk for osteoporosis and fractures, ask your health care provider if you should be screened.  Ask your health care provider whether you should take a calcium or vitamin D supplement to lower your risk for osteoporosis.  Menopause may have certain physical symptoms and risks.  Hormone replacement therapy may reduce some of these symptoms and risks. Talk to your health care provider about whether hormone replacement therapy is right for you.  HOME CARE INSTRUCTIONS   Schedule regular health, dental, and eye exams.  Stay current with your immunizations.   Do not use any tobacco products including cigarettes, chewing tobacco, or electronic cigarettes.  If you are pregnant, do not drink alcohol.  If you are breastfeeding, limit how much and how often you drink alcohol.  Limit alcohol intake to no more than 1 drink per day for nonpregnant women. One drink equals 12 ounces of beer, 5 ounces of wine, or 1 ounces of hard liquor.  Do not use street drugs.  Do not share needles.  Ask your health care provider for help if  you need support or information about quitting drugs.  Tell your health care provider if you often feel depressed.  Tell your health care provider if you have ever been abused or do not feel safe at home.   This information is not intended to replace advice given to you by your health care provider. Make sure you discuss any questions you have with your health care provider.   Document Released: 09/16/2010 Document Revised: 03/24/2014 Document Reviewed: 02/02/2013 Elsevier Interactive Patient Education [employer].

## 2015-10-26 NOTE — Telephone Encounter (Signed)
Pt was informed during visit that she can call [place] to set the mammogram up. Renaldo Fiddler, CMA

## 2015-10-27 LAB — CBC WITH DIFFERENTIAL/PLATELET
Basophils Absolute: 0 10*3/uL (ref 0.0–0.2)
Basos: 1 %
EOS (ABSOLUTE): 0.3 10*3/uL (ref 0.0–0.4)
Eos: 3 %
Hematocrit: 39.2 % (ref 34.0–46.6)
Hemoglobin: 13 g/dL (ref 11.1–15.9)
Immature Grans (Abs): 0 10*3/uL (ref 0.0–0.1)
Immature Granulocytes: 0 %
Lymphocytes Absolute: 1.8 10*3/uL (ref 0.7–3.1)
Lymphs: 22 %
MCH: 30.2 pg (ref 26.6–33.0)
MCHC: 33.2 g/dL (ref 31.5–35.7)
MCV: 91 fL (ref 79–97)
Monocytes Absolute: 0.7 10*3/uL (ref 0.1–0.9)
Monocytes: 8 %
Neutrophils Absolute: 5.6 10*3/uL (ref 1.4–7.0)
Neutrophils: 66 %
Platelets: 294 10*3/uL (ref 150–379)
RBC: 4.3 x10E6/uL (ref 3.77–5.28)
RDW: 13.2 % (ref 12.3–15.4)
WBC: 8.4 10*3/uL (ref 3.4–10.8)

## 2015-10-27 LAB — COMPREHENSIVE METABOLIC PANEL
ALT: 12 IU/L (ref 0–32)
AST: 12 IU/L (ref 0–40)
Albumin/Globulin Ratio: 1.9 (ref 1.2–2.2)
Albumin: 4.3 g/dL (ref 3.5–5.5)
Alkaline Phosphatase: 61 IU/L (ref 39–117)
BUN/Creatinine Ratio: 24 — ABNORMAL HIGH (ref 9–23)
BUN: 17 mg/dL (ref 6–24)
Bilirubin Total: 0.3 mg/dL (ref 0.0–1.2)
CO2: 26 mmol/L (ref 18–29)
Calcium: 9.4 mg/dL (ref 8.7–10.2)
Chloride: 97 mmol/L (ref 96–106)
Creatinine, Ser: 0.7 mg/dL (ref 0.57–1.00)
GFR calc Af Amer: 116 mL/min/{1.73_m2} (ref >59)
GFR calc non Af Amer: 101 mL/min/{1.73_m2} (ref >59)
Globulin, Total: 2.3 g/dL (ref 1.5–4.5)
Glucose: 99 mg/dL (ref 65–99)
Potassium: 4.9 mmol/L (ref 3.5–5.2)
Sodium: 137 mmol/L (ref 134–144)
Total Protein: 6.6 g/dL (ref 6.0–8.5)

## 2015-10-27 LAB — LIPID PANEL
Chol/HDL Ratio: 2.8 ratio units (ref 0.0–4.4)
Cholesterol, Total: 215 mg/dL — ABNORMAL HIGH (ref 100–199)
HDL: 76 mg/dL (ref >39)
LDL Calculated: 114 mg/dL — ABNORMAL HIGH (ref 0–99)
Triglycerides: 126 mg/dL (ref 0–149)
VLDL Cholesterol Cal: 25 mg/dL (ref 5–40)

## 2015-10-27 LAB — HEMOGLOBIN A1C
Est. average glucose Bld gHb Est-mCnc: 108 mg/dL
Hgb A1c MFr Bld: 5.4 % (ref 4.8–5.6)

## 2015-10-27 LAB — LUTEINIZING HORMONE: LH: 4.6 m[IU]/mL

## 2015-10-27 LAB — FOLLICLE STIMULATING HORMONE: FSH: 8.9 m[IU]/mL

## 2015-10-27 LAB — TSH: TSH: 3.68 u[IU]/mL (ref 0.450–4.500)

## 2015-10-29 ENCOUNTER — Telehealth: Payer: Self-pay

## 2015-10-29 DIAGNOSIS — N921 Excessive and frequent menstruation with irregular cycle: Secondary | ICD-10-CM

## 2015-10-29 LAB — PAP IG W/ RFLX HPV ASCU: PAP Smear Comment: 0

## 2015-10-29 NOTE — Telephone Encounter (Signed)
Patient has been advised she would like referral for Ob/Gyn.KW

## 2015-10-29 NOTE — Telephone Encounter (Signed)
-----   Message from Mar Daring, [place] sent at 10/29/2015  4:52 PM EDT ----- Pap appears to be normal. Still recommend ob/gyn and [place].

## 2015-11-09 ENCOUNTER — Other Ambulatory Visit: Payer: Self-pay | Admitting: Family Medicine

## 2015-11-14 ENCOUNTER — Telehealth: Payer: Self-pay

## 2015-11-14 NOTE — Telephone Encounter (Signed)
Patient needs to be scheduled for a screening colonoscopy. Called two times, and sent out a notification letter. Please update referral once procedure has been scheduled.

## 2015-11-28 ENCOUNTER — Other Ambulatory Visit: Payer: Self-pay | Admitting: Family Medicine

## 2015-11-28 DIAGNOSIS — J309 Allergic rhinitis, unspecified: Secondary | ICD-10-CM

## 2015-11-29 ENCOUNTER — Ambulatory Visit: Payer: BLUE CROSS/BLUE SHIELD | Admitting: Physician Assistant

## 2015-11-30 ENCOUNTER — Other Ambulatory Visit: Payer: Self-pay | Admitting: Family Medicine

## 2015-11-30 DIAGNOSIS — J45901 Unspecified asthma with (acute) exacerbation: Secondary | ICD-10-CM

## 2015-12-02 ENCOUNTER — Other Ambulatory Visit: Payer: Self-pay | Admitting: Family Medicine

## 2015-12-02 DIAGNOSIS — J45901 Unspecified asthma with (acute) exacerbation: Secondary | ICD-10-CM

## 2016-06-12 ENCOUNTER — Other Ambulatory Visit: Payer: Self-pay | Admitting: Physician Assistant

## 2016-06-12 DIAGNOSIS — J309 Allergic rhinitis, unspecified: Secondary | ICD-10-CM

## 2016-06-12 NOTE — Telephone Encounter (Signed)
Last ov  10/25/15 Last filled 11/28/15 Please review. Thank you. sd

## 2016-06-30 ENCOUNTER — Encounter: Payer: Self-pay | Admitting: Physician Assistant

## 2016-06-30 ENCOUNTER — Ambulatory Visit (INDEPENDENT_AMBULATORY_CARE_PROVIDER_SITE_OTHER): Payer: Self-pay | Admitting: Physician Assistant

## 2016-06-30 VITALS — BP 120/70 | HR 86 | Temp 98.3°F | Resp 20 | Wt 257.6 lb

## 2016-06-30 DIAGNOSIS — R059 Cough, unspecified: Secondary | ICD-10-CM

## 2016-06-30 DIAGNOSIS — J4521 Mild intermittent asthma with (acute) exacerbation: Secondary | ICD-10-CM

## 2016-06-30 DIAGNOSIS — R05 Cough: Secondary | ICD-10-CM

## 2016-06-30 MED ORDER — HYDROCODONE-HOMATROPINE 5-1.5 MG/5ML PO SYRP: 5.0000 mL | ORAL_SOLUTION | Freq: Three times a day (TID) | ORAL | 0 refills | Status: DC | PRN

## 2016-06-30 MED ORDER — PREDNISONE 10 MG PO TABS: ORAL_TABLET | ORAL | 0 refills | Status: DC

## 2016-06-30 MED ORDER — ALBUTEROL SULFATE HFA 108 (90 BASE) MCG/ACT IN AERS: 2.0000 | INHALATION_SPRAY | Freq: Four times a day (QID) | RESPIRATORY_TRACT | 5 refills | Status: DC | PRN

## 2016-06-30 MED ORDER — LEVALBUTEROL HCL 0.63 MG/3ML IN NEBU: 0.6300 mg | INHALATION_SOLUTION | Freq: Four times a day (QID) | RESPIRATORY_TRACT | 12 refills | Status: DC | PRN

## 2016-06-30 NOTE — Progress Notes (Signed)
Patient: Megan Jimenez Female    DOB: March 26, 1965   51 y.o.   MRN: [id number] Visit Date: 06/30/2016  Today's Provider: Mar Daring, PA-C   Chief Complaint  Patient presents with  . Cough   Subjective:    Cough  This is a new problem. The current episode started in the past 7 days (Friday). The problem has been gradually worsening. The problem occurs constantly. The cough is non-productive. Associated symptoms include ear congestion, shortness of breath and wheezing. Pertinent negatives include no chest pain, chills, ear pain, fever, headaches, nasal congestion, postnasal drip, rhinorrhea or sore throat. Associated symptoms comments: She reports that she woke up Saturday morning with SOB and unable to "catch" her breath. She almost call 911 because her anxiety kick in but was able to control the hyperventilation that she was having at that moment.. The symptoms are aggravated by other (any movement). She has tried rest (She doesn't have insurance at this moment.Uanble to do her inhaler or Advair. She is resting and using Mucinex) for the symptoms. The treatment provided no relief. Her past medical history is significant for asthma.      No Known Allergies   Current Outpatient Prescriptions:  .  lithium carbonate (ESKALITH) 450 MG CR tablet, Take 1 tablet by mouth daily., Disp: , Rfl: 0 .  montelukast (SINGULAIR) 10 MG tablet, TAKE 1 TABLET BY MOUTH ONCE DAILY, Disp: 90 tablet, Rfl: 1 .  Vitamin D, Ergocalciferol, (DRISDOL) 50000 units CAPS capsule, , Disp: , Rfl: 5 .  ADVAIR DISKUS 250-50 MCG/DOSE AEPB, INHALE ONE DOSE BY MOUTH TWICE DAILY (Patient not taking: Reported on 06/30/2016), Disp: 60 each, Rfl: 5 .  ALPRAZolam (XANAX) 1 MG tablet, Take 1 tablet by mouth 3 (three) times daily as needed., Disp: , Rfl: 3 .  lamoTRIgine (LAMICTAL) 200 MG tablet, Take 200 mg by mouth 2 (two) times daily. , Disp: , Rfl:  .  VENTOLIN HFA 108 (90 Base) MCG/ACT inhaler, INHALE ONE PUFF BY  MOUTH EVERY 4 HOURS AS NEEDED (Patient not taking: Reported on 06/30/2016), Disp: 18 each, Rfl: 5  Review of Systems  Constitutional: Negative for chills and fever.  HENT: Positive for congestion and sinus pressure. Negative for ear pain, postnasal drip, rhinorrhea, sinus pain, sneezing, sore throat, tinnitus and trouble swallowing.   Respiratory: Positive for cough, chest tightness, shortness of breath and wheezing.   Cardiovascular: Positive for palpitations. Negative for chest pain and leg swelling.  Gastrointestinal: Negative for abdominal pain and nausea.  Neurological: Negative for dizziness, syncope, light-headedness and headaches.  Psychiatric/Behavioral: Positive for sleep disturbance. The patient is nervous/anxious.     Social History  Substance Use Topics  . Smoking status: Former Smoker    Quit date: 03/17/2004  . Smokeless tobacco: Never Used  . Alcohol use No   Objective:   BP 120/70 (BP Location: Right Arm, Patient Position: Sitting, Cuff Size: Normal)   Pulse 86   Temp 98.3 F (36.8 C) (Oral)   Resp 20   Wt 257 lb 9.6 oz (116.8 kg)   SpO2 95%   BMI 39.17 kg/m    Physical Exam  Constitutional: She appears well-developed and well-nourished. No distress.  HENT:  Head: Normocephalic and atraumatic.  Right Ear: Hearing, tympanic membrane, external ear and ear canal normal.  Left Ear: Hearing, tympanic membrane, external ear and ear canal normal.  Nose: Mucosal edema and rhinorrhea present. Right sinus exhibits maxillary sinus tenderness. Right sinus exhibits no frontal  sinus tenderness. Left sinus exhibits maxillary sinus tenderness. Left sinus exhibits no frontal sinus tenderness.  Mouth/Throat: Uvula is midline, oropharynx is clear and moist and mucous membranes are normal. No oropharyngeal exudate, posterior oropharyngeal edema or posterior oropharyngeal erythema.  Neck: Normal range of motion. Neck supple. No tracheal deviation present. No thyromegaly present.    Cardiovascular: Normal rate, regular rhythm and normal heart sounds.  Exam reveals no gallop and no friction rub.   No murmur heard. Pulmonary/Chest: Effort normal. No stridor. No respiratory distress. She has decreased breath sounds. She has wheezes (inspiratory and expiratory scattered diffusely throughout). She has no rhonchi. She has no rales.  Lymphadenopathy:    She has no cervical adenopathy.  Skin: She is not diaphoretic.  Vitals reviewed.       Assessment & Plan:     1. Mild intermittent asthma with acute exacerbation Worsening symptoms. Prednisone given as below for acute exacerbation. Albuterol and nebulizer medications sent in for refills. Continue allergy medications. She is to call if symptoms worsen or fail to improve following treatment.  - albuterol (PROVENTIL HFA;VENTOLIN HFA) 108 (90 Base) MCG/ACT inhaler; Inhale 2 puffs into the lungs every 6 (six) hours as needed for wheezing or shortness of breath.  Dispense: 1 Inhaler; Refill: 5 - predniSONE (DELTASONE) 10 MG tablet; Take 6 tabs PO on day 1&2, 5 tabs PO on day 3&4, 4 tabs PO on day 5&6, 3 tabs PO on day 7&8, 2 tabs PO on day 9&10, 1 tab PO on day 11&12.  Dispense: 42 tablet; Refill: 0 - levalbuterol (XOPENEX) 0.63 MG/3ML nebulizer solution; Take 3 mLs (0.63 mg total) by nebulization every 6 (six) hours as needed for wheezing or shortness of breath.  Dispense: 3 mL; Refill: 12  2. Cough Worsening symptoms that has not responded to OTC medications. Will give Hycodan cough syrup as below for nighttime cough. Drowsiness precautions given to patient. Stay well hydrated. Use delsym, robitussin OR mucinex for daytime cough. - HYDROcodone-homatropine (HYCODAN) 5-1.5 MG/5ML syrup; Take 5 mLs by mouth every 8 (eight) hours as needed for cough.  Dispense: 120 mL; Refill: 0       Mar Daring, PA-C  [facility]

## 2016-06-30 NOTE — Patient Instructions (Signed)

## 2016-10-31 ENCOUNTER — Encounter: Payer: Self-pay | Admitting: Physician Assistant

## 2017-02-27 ENCOUNTER — Other Ambulatory Visit: Payer: Self-pay | Admitting: Physician Assistant

## 2017-02-27 DIAGNOSIS — J309 Allergic rhinitis, unspecified: Secondary | ICD-10-CM

## 2017-06-14 IMAGING — MR MR CERVICAL SPINE W/O CM
6 of 8 series · 35 of 48 positions shown · non-contrast
Comparison: CT scan of the cervical spine dated 04/09/2015

CLINICAL DATA: Left-sided neck pain and left arm weakness secondary
to a fall down stairs yesterday.

EXAM:
MRI CERVICAL SPINE WITHOUT CONTRAST
TECHNIQUE: Multiplanar, multisequence MR imaging of the cervical spine was
performed. No intravenous contrast was administered.

[Series 2: T2 · sagittal · 3.0mm · 0.56mm/px · 4 of 15 slices shown (1 of 4)]
[im 1/15]
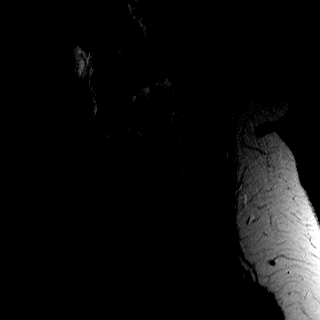
[im 5/15]
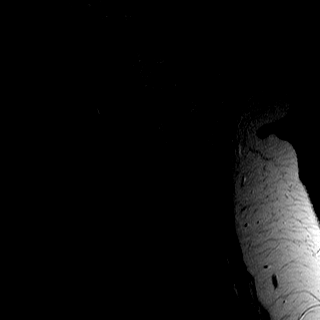
[im 10/15]
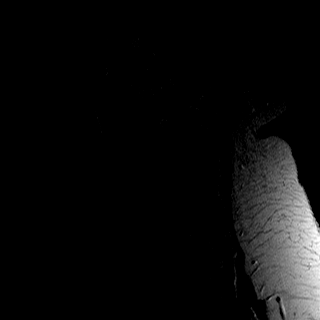
[im 15/15]
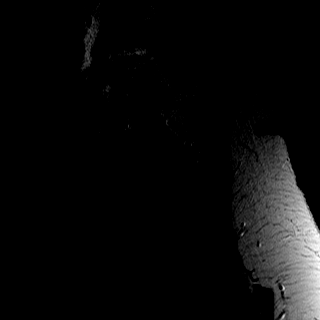

[Series 4: T2 · sagittal · 3.0mm · 0.56mm/px · 5 of 15 slices shown (2 of 4)]
[im 1/15]
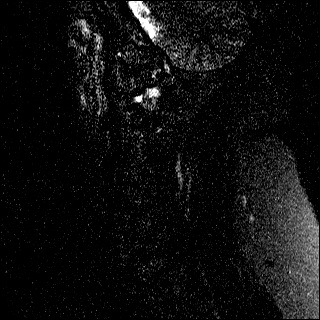
[im 4/15]
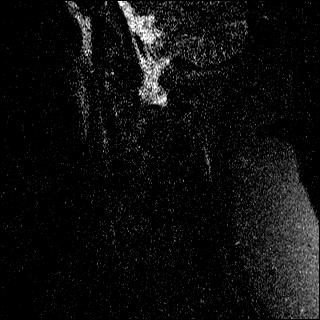
[im 8/15]
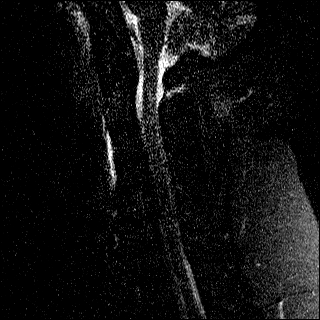
[im 11/15]
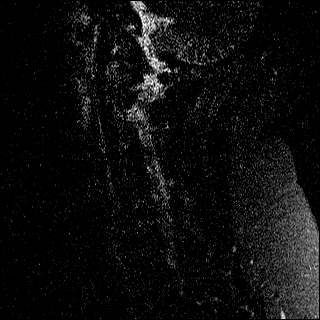
[im 15/15]
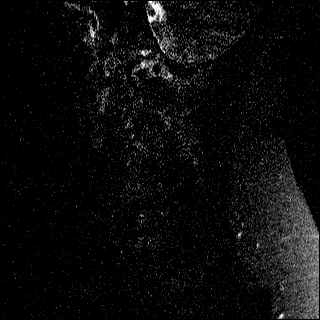

[Series 6: T2 · axial · 3.0mm · 0.70mm/px · z∈[-77,+22]mm · 8 of 27 slices shown (3 of 4)]
[im 1/27]
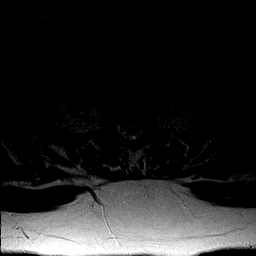
[im 4/27]
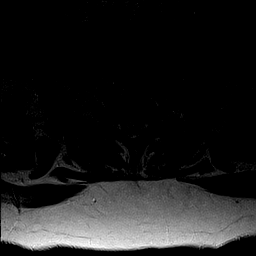
[im 8/27]
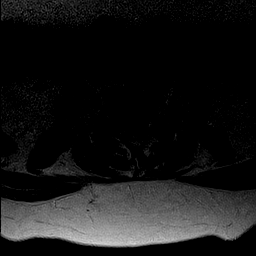
[im 12/27]
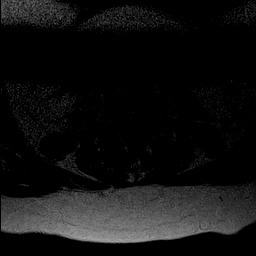
[im 15/27]
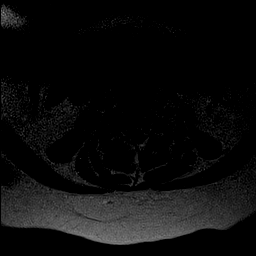
[im 19/27]
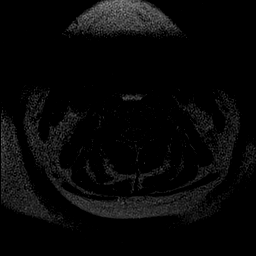
[im 23/27]
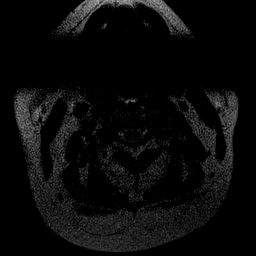
[im 27/27]
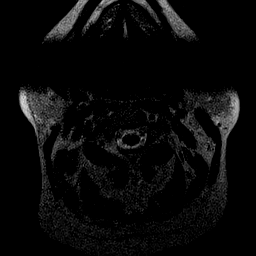

[Series 7: mpgr ax · axial · 3.0mm · 0.35mm/px · z∈[-77,+22]mm · 8 of 27 slices shown]
[im 1/27]
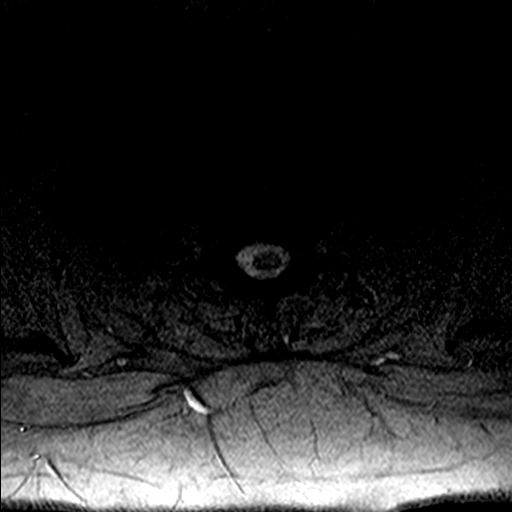
[im 4/27]
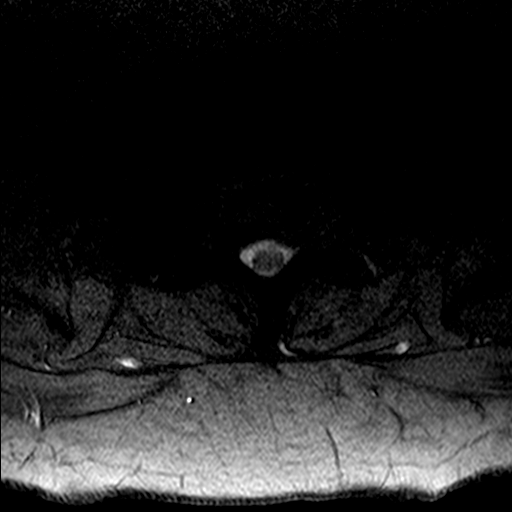
[im 8/27]
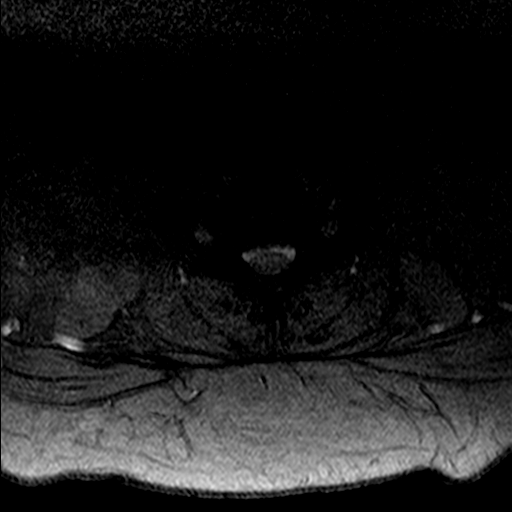
[im 12/27]
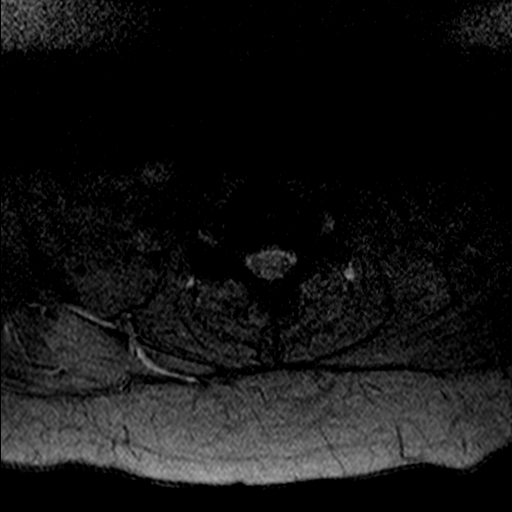
[im 15/27]
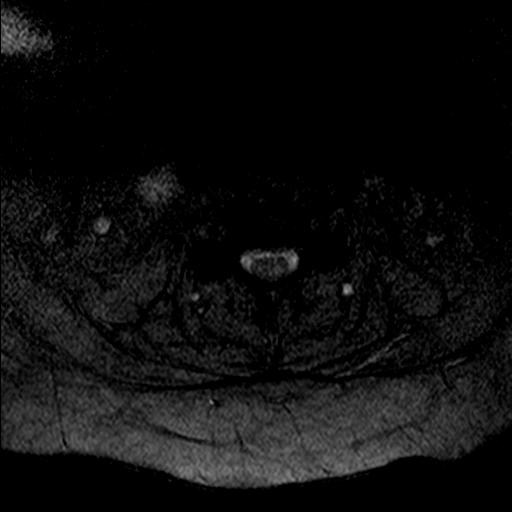
[im 19/27]
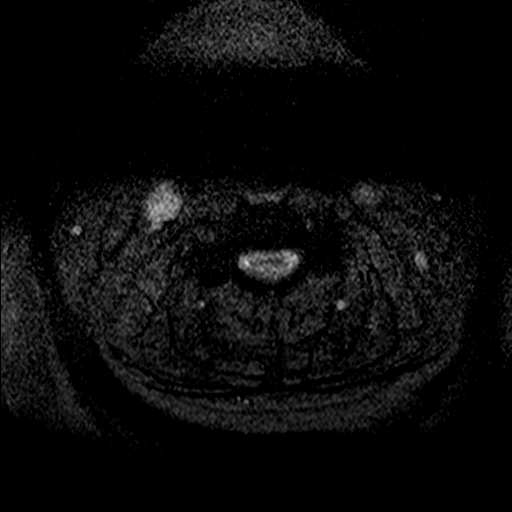
[im 23/27]
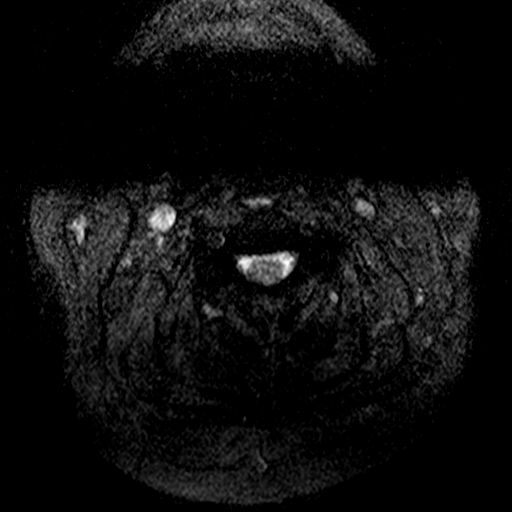
[im 27/27]
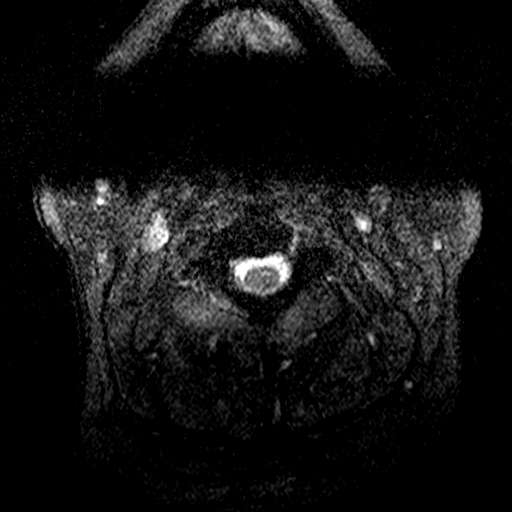

[Series 10: T2 · sagittal · 3.0mm · 0.68mm/px · 5 of 15 slices shown (4 of 4)]
[im 1/15]
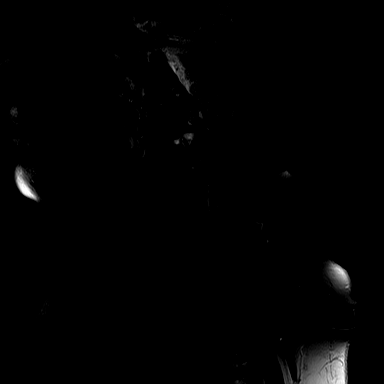
[im 4/15]
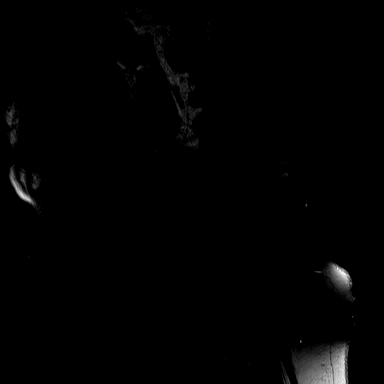
[im 8/15]
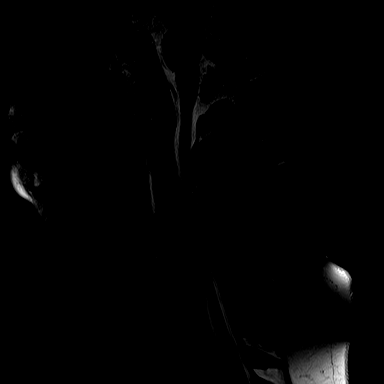
[im 11/15]
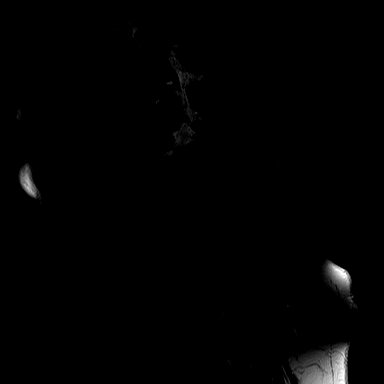
[im 15/15]
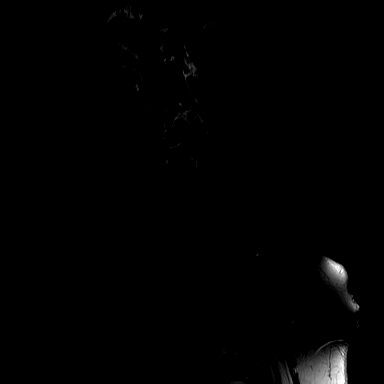

[Series 11: T1 · sagittal · 3.0mm · 0.70mm/px · 5 of 15 slices shown]
[im 1/15]
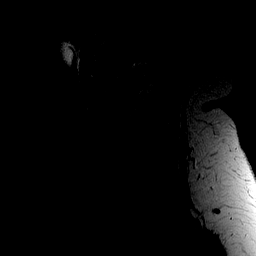
[im 4/15]
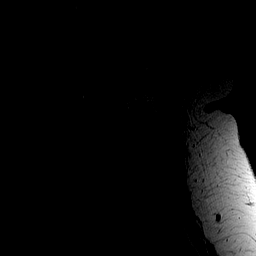
[im 8/15]
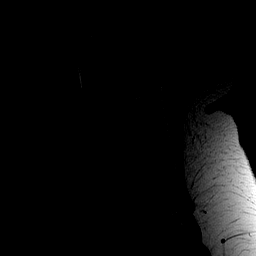
[im 11/15]
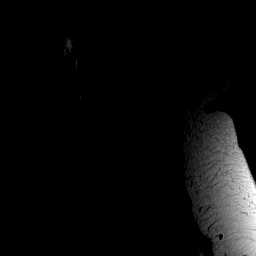
[im 15/15]
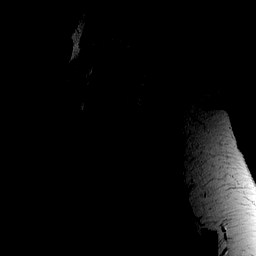

[35 of 48 positions shown; findings below may reference images not displayed]

FINDINGS: The scans are degraded by motion artifact.

The visualized intracranial contents are normal. Cervical spinal
cord appears normal with no mass lesion or myelopathy or spinal cord
compression.

There is abnormal prevertebral fluid extending from C2-3 to the
inferior aspect of C4 anteriorly. There is no subluxation.

Craniocervical junction through C2-3: Slight bilateral facet
arthritis at C2-3. Otherwise normal.

C3-4: Small broad-based disc bulge with moderate left facet
arthritis and moderate left foraminal stenosis. No bone edema.

C4-5: Disc space narrowing with a small broad-based disc bulge with
accompanying osteophytes with minimal left foraminal narrowing.

C5-6: Chronic disc space narrowing with a broad-based disc
osteophyte complex with moderate right and severe left foraminal
stenosis. The osteophytes protrude into both lateral recesses.

C6-7: Disc space narrowing with disc osteophyte complex protruding
into both lateral recesses. No significant foraminal stenosis.

C7-T1 2 mm anterolisthesis due to moderate bilateral facet
arthritis. The disc is normal. No foraminal stenosis.
IMPRESSION: 1. Abnormal prevertebral soft tissue edema extending from C2-3 to
the inferior aspect of C4 anteriorly. No appreciable fracture or
posterior soft tissue edema. However, this suggests ligamentous
injury anteriorly.
2. Multilevel degenerative disc disease with foraminal stenoses,
severe at C4-5 on the left.
3. Normal appearing cervical spinal cord. Critical Value/emergent
results were called by telephone at the time of interpretation on
04/09/2015 at [DATE] to Dr. DARSEA SH , who verbally
acknowledged these results.

## 2017-06-14 IMAGING — CT CT CERVICAL SPINE W/O CM
2 of 3 series · 8 of 14 positions shown, 10 images · non-contrast
Comparison: 02/19/2013

CLINICAL DATA: Fell yesterday at church down 10-15 steps. Possible
loss of consciousness. Headache. Neck pain and left arm numbness.

EXAM:
CT HEAD WITHOUT CONTRAST
CT CERVICAL SPINE WITHOUT CONTRAST
TECHNIQUE: Multidetector CT imaging of the head and cervical spine was
performed following the standard protocol without intravenous
contrast. Multiplanar CT image reconstructions of the cervical spine
were also generated.

[Series 5: c spine soft · axial · 0.33mm/px · z∈[-316,-236]mm · 3 of 82 slices shown]
[im 21/82  soft-tissue]
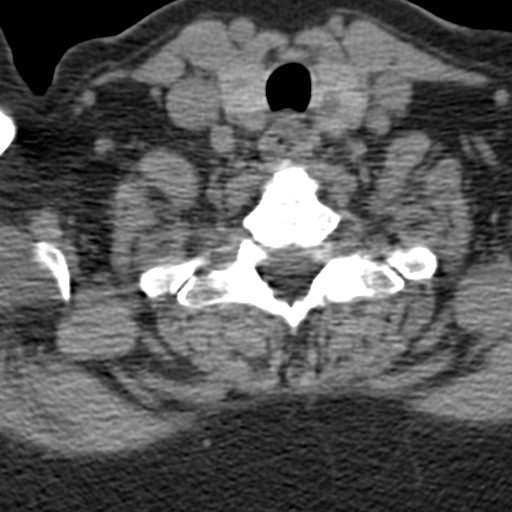
[im 41/82  soft-tissue]
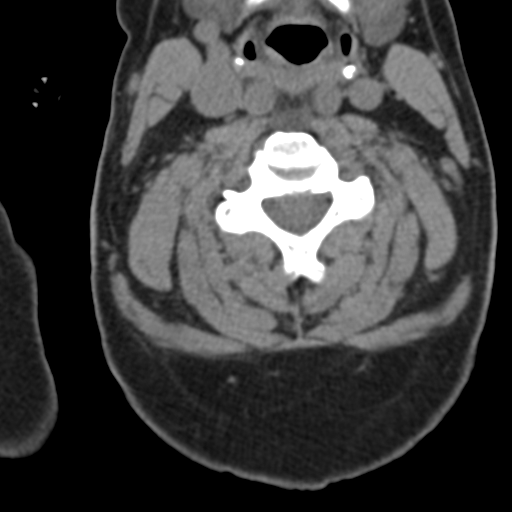
[im 61/82  soft-tissue]
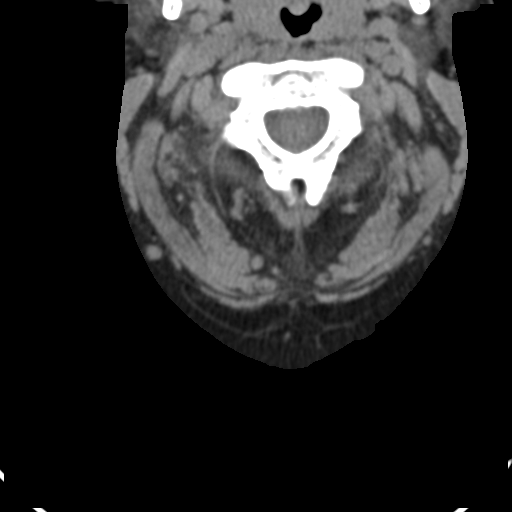

[Series 8: orthogonal axials · axial · 0.23mm/px · z∈[-388,-249]mm · 5 of 122 slices shown, 7 images]
[im 21/122  soft-tissue]
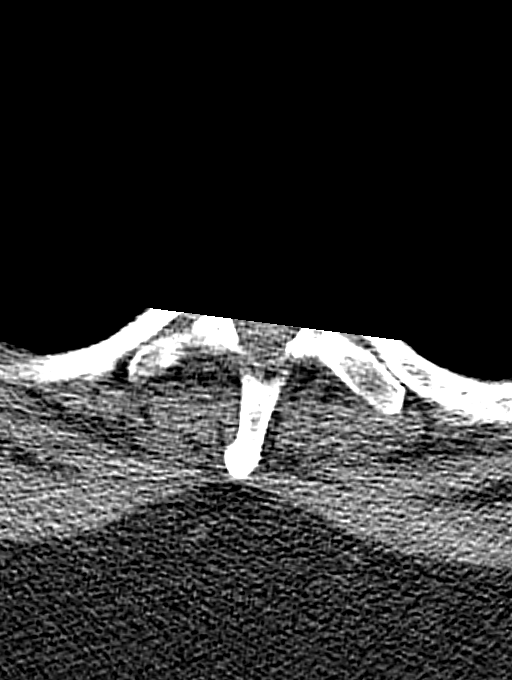
[im 21/122  bone]
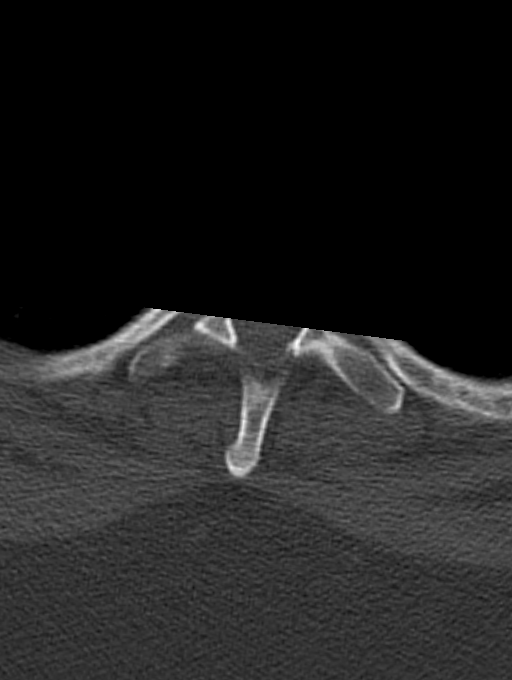
[im 41/122  bone]
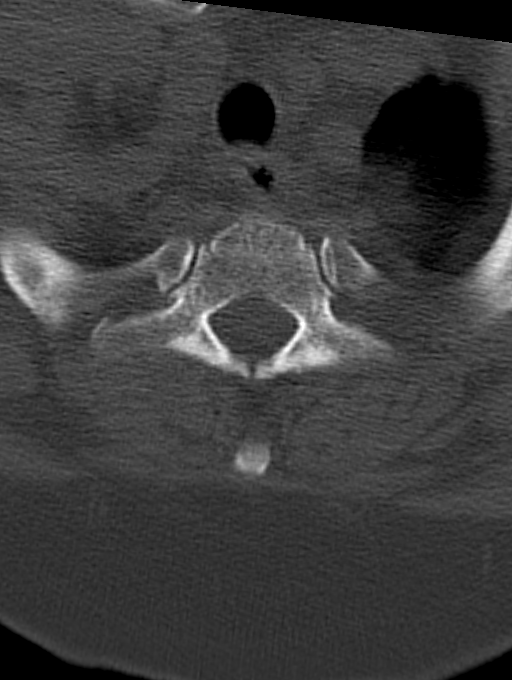
[im 61/122  bone]
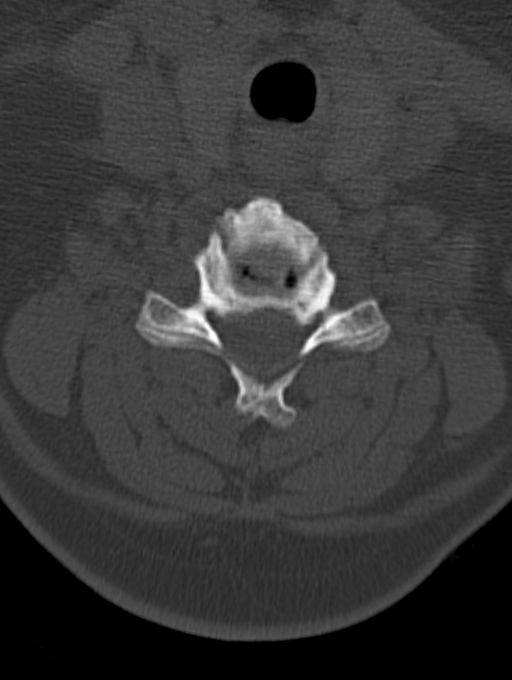
[im 81/122  bone]
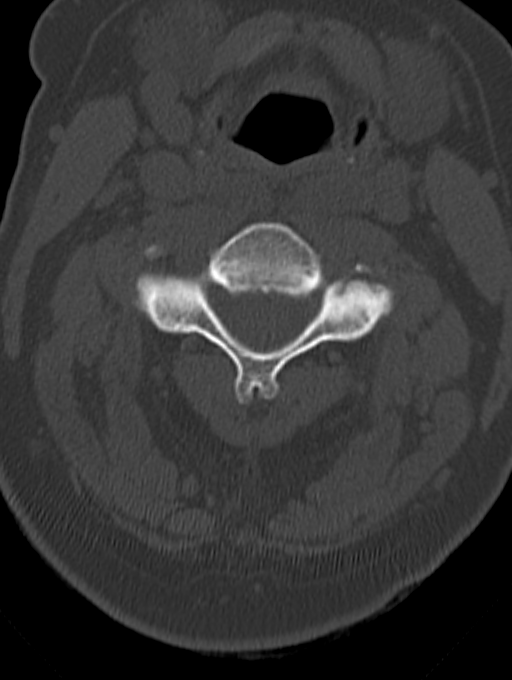
[im 101/122  soft-tissue]
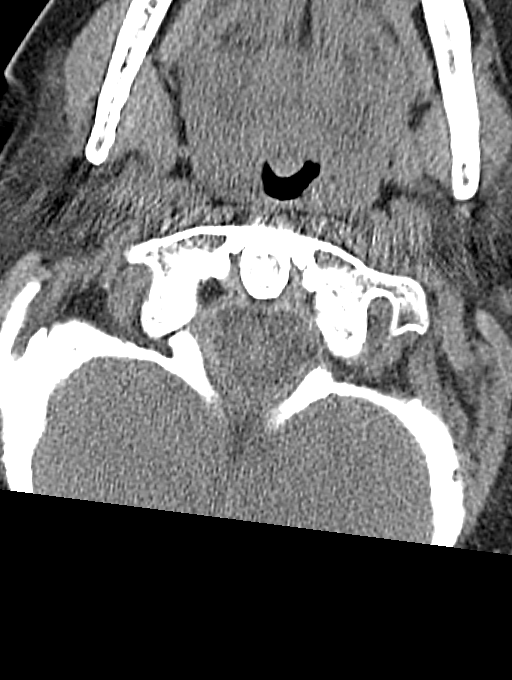
[im 101/122  bone]
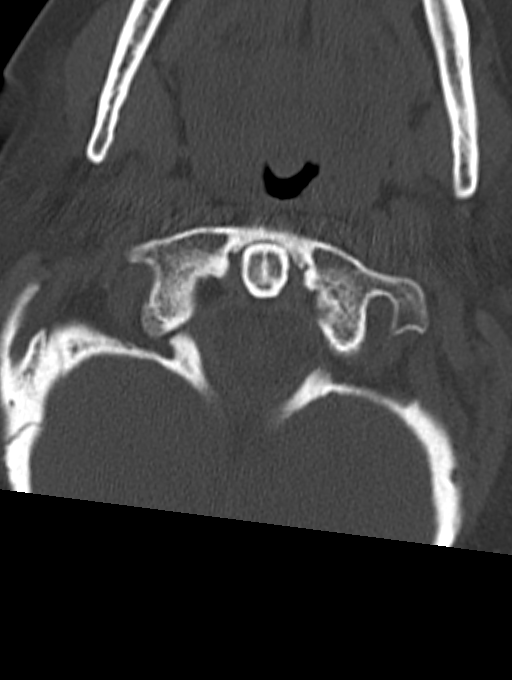

[8 of 14 positions shown; findings below may reference images not displayed]

FINDINGS: CT HEAD FINDINGS

The brain has a normal appearance without evidence of malformation,
atrophy, old or acute infarction, mass lesion, hemorrhage,
hydrocephalus or extra-axial collection. The calvarium is
unremarkable. The paranasal sinuses, middle ears and mastoids are
clear. There is mild right frontal scalp swelling.

CT CERVICAL SPINE FINDINGS

No fracture, malalignment a or soft tissue swelling is visible.
There is degenerative spondylosis at C4-5, C5-6 and C6-7 with small
osteophytes encroach upon the foramina. This is most pronounced at
the C5-6 level. There is facet degeneration bilaterally at C3-4 and
C7-T1. Small thyroid nodules most consistent with multinodular
goiter. No dominant lesion is visible.
IMPRESSION: Head CT: No acute or traumatic intracranial finding. Normal
appearance of the brain. Right frontal scalp swelling without
visible underlying skull fracture.

Cervical spine CT: No acute or traumatic finding. Degenerative
spondylosis and facet arthropathy as outlined above.

## 2017-07-23 ENCOUNTER — Other Ambulatory Visit: Payer: Self-pay | Admitting: Physician Assistant

## 2017-07-23 DIAGNOSIS — J309 Allergic rhinitis, unspecified: Secondary | ICD-10-CM

## 2017-09-28 ENCOUNTER — Emergency Department: Payer: Self-pay

## 2017-09-28 ENCOUNTER — Inpatient Hospital Stay
Admission: EM | Admit: 2017-09-28 | Discharge: 2017-09-30 | DRG: 203 | Disposition: A | Discharge status: Home / Self Care | Payer: Self-pay | Attending: Internal Medicine | Admitting: Internal Medicine

## 2017-09-28 ENCOUNTER — Other Ambulatory Visit: Payer: Self-pay

## 2017-09-28 DIAGNOSIS — Z6837 Body mass index (BMI) 37.0-37.9, adult: Secondary | ICD-10-CM

## 2017-09-28 DIAGNOSIS — F319 Bipolar disorder, unspecified: Secondary | ICD-10-CM | POA: Diagnosis present

## 2017-09-28 DIAGNOSIS — Z7951 Long term (current) use of inhaled steroids: Secondary | ICD-10-CM

## 2017-09-28 DIAGNOSIS — E669 Obesity, unspecified: Secondary | ICD-10-CM | POA: Diagnosis present

## 2017-09-28 DIAGNOSIS — E860 Dehydration: Secondary | ICD-10-CM | POA: Diagnosis present

## 2017-09-28 DIAGNOSIS — Z87891 Personal history of nicotine dependence: Secondary | ICD-10-CM

## 2017-09-28 DIAGNOSIS — E162 Hypoglycemia, unspecified: Secondary | ICD-10-CM | POA: Diagnosis present

## 2017-09-28 DIAGNOSIS — Z79899 Other long term (current) drug therapy: Secondary | ICD-10-CM

## 2017-09-28 DIAGNOSIS — J209 Acute bronchitis, unspecified: Secondary | ICD-10-CM | POA: Diagnosis present

## 2017-09-28 DIAGNOSIS — J45901 Unspecified asthma with (acute) exacerbation: Principal | ICD-10-CM | POA: Diagnosis present

## 2017-09-28 LAB — URINALYSIS, COMPLETE (UACMP) WITH MICROSCOPIC
Bilirubin Urine: NEGATIVE
Glucose, UA: NEGATIVE mg/dL
Ketones, ur: 5 mg/dL — AB
Leukocytes, UA: NEGATIVE
Nitrite: NEGATIVE
Protein, ur: NEGATIVE mg/dL
Specific Gravity, Urine: 1.02 (ref 1.005–1.030)
pH: 5 (ref 5.0–8.0)

## 2017-09-28 LAB — BASIC METABOLIC PANEL
Anion gap: 7 (ref 5–15)
BUN: 12 mg/dL (ref 6–20)
CO2: 27 mmol/L (ref 22–32)
Calcium: 8.9 mg/dL (ref 8.9–10.3)
Chloride: 104 mmol/L (ref 98–111)
Creatinine, Ser: 0.42 mg/dL — ABNORMAL LOW (ref 0.44–1.00)
GFR calc Af Amer: >60 mL/min (ref >60)
GFR calc non Af Amer: >60 mL/min (ref >60)
Glucose, Bld: 104 mg/dL — ABNORMAL HIGH (ref 70–99)
Potassium: 4.1 mmol/L (ref 3.5–5.1)
Sodium: 138 mmol/L (ref 135–145)

## 2017-09-28 LAB — CBC
HCT: 40.3 % (ref 35.0–47.0)
Hemoglobin: 14.1 g/dL (ref 12.0–16.0)
MCH: 33 pg (ref 26.0–34.0)
MCHC: 35.1 g/dL (ref 32.0–36.0)
MCV: 93.9 fL (ref 80.0–100.0)
Platelets: 256 10*3/uL (ref 150–440)
RBC: 4.29 MIL/uL (ref 3.80–5.20)
RDW: 13.4 % (ref 11.5–14.5)
WBC: 7.6 10*3/uL (ref 3.6–11.0)

## 2017-09-28 LAB — POCT PREGNANCY, URINE: Preg Test, Ur: NEGATIVE

## 2017-09-28 LAB — TROPONIN I: Troponin I: <0.03 ng/mL (ref <0.03)

## 2017-09-28 MED ORDER — IPRATROPIUM-ALBUTEROL 0.5-2.5 (3) MG/3ML IN SOLN
3.0000 mL | Freq: Once | RESPIRATORY_TRACT | Status: AC
  Administered 2017-09-28: 3 mL via RESPIRATORY_TRACT
  Filled 2017-09-28: qty 3

## 2017-09-28 MED ORDER — MAGNESIUM SULFATE 2 GM/50ML IV SOLN
2.0000 g | Freq: Once | INTRAVENOUS | Status: AC
  Administered 2017-09-28: 2 g via INTRAVENOUS
  Filled 2017-09-28: qty 50

## 2017-09-28 MED ORDER — PREDNISONE 20 MG PO TABS: 40.0000 mg | ORAL_TABLET | Freq: Every day | ORAL | 0 refills | Status: DC

## 2017-09-28 MED ORDER — IPRATROPIUM-ALBUTEROL 0.5-2.5 (3) MG/3ML IN SOLN
3.0000 mL | Freq: Once | RESPIRATORY_TRACT | Status: AC
Start: 2017-09-28 — End: 2017-09-28
  Administered 2017-09-28: 3 mL via RESPIRATORY_TRACT
  Filled 2017-09-28 (×3): qty 3

## 2017-09-28 MED ORDER — IPRATROPIUM-ALBUTEROL 0.5-2.5 (3) MG/3ML IN SOLN
RESPIRATORY_TRACT | Status: AC
  Administered 2017-09-28: 3 mL via RESPIRATORY_TRACT
  Filled 2017-09-28: qty 3

## 2017-09-28 MED ORDER — ALBUTEROL SULFATE (2.5 MG/3ML) 0.083% IN NEBU
5.0000 mg | INHALATION_SOLUTION | Freq: Once | RESPIRATORY_TRACT | Status: AC
  Administered 2017-09-28: 5 mg via RESPIRATORY_TRACT
  Filled 2017-09-28: qty 6

## 2017-09-28 MED ORDER — METHYLPREDNISOLONE SODIUM SUCC 125 MG IJ SOLR
125.0000 mg | Freq: Once | INTRAMUSCULAR | Status: AC
  Administered 2017-09-28: 125 mg via INTRAVENOUS
  Filled 2017-09-28: qty 2

## 2017-09-28 NOTE — ED Notes (Addendum)
Pt states that for the past few weeks she has been SOB. Pt states that when she uses her inhaler it gives her no relief. Pt has Hx of asthma. Pt states that past few days she has had back pain, weakness, and abdominal pain along with noticing that her urine has gotten darker. States that she is aching all over. Pt states that on yesterday she had a fever of 102.0 She says she took some Tylenol before she got here.

## 2017-09-28 NOTE — Discharge Instructions (Signed)
Please seek medical attention for any high fevers, chest pain, shortness of breath, change in behavior, persistent vomiting, bloody stool or any other new or concerning symptoms.  

## 2017-09-28 NOTE — ED Triage Notes (Addendum)
Pt arrives to ED via POV with c/o centralized CP with "left arm numbness", and SHOB x1 week. Pt reports h/x of asthma. Pt with audible inspiratory and expiratory wheezes, pt also reports weakness and "dark urine" for the last few days. Pt is A&O, in NAD; RR even, regular, and unlabored. Pt also reports bilateral feet and ankle swelling x2 weeks, but denies h/x of CHF.

## 2017-09-28 NOTE — ED Provider Notes (Signed)
[facility] Emergency Department Provider Note  ____________________________________________   I have reviewed the triage vital signs and the nursing notes.   HISTORY  Chief Complaint Chest Pain and Shortness of Breath   History limited by: Not Limited   HPI Megan Jimenez is a 52 y.o. female who presents to the emergency department today with primary concern for breathing difficulty and feeling unwell.  Patient states that for the past week and a half she has not been feeling very well.  She has had some generalized pain and weakness.  Over the past few days however the symptoms have been worse.  She is noticed increasing difficulty with breathing.  She has had cough.  She does have a history of asthma and is been taking her home medications without any significant relief.  She is also tried taking some over-the-counter medications for cold-like symptoms without any relief.  States she did have a coworker was diagnosed with the flu and secondary pneumonia.  She has had some subjective fevers at home.   Per medical record review patient has a history of asthma  Past Medical History:  Diagnosis Date  . Asthma   . Bipolar 1 disorder [facility])     Patient Active Problem List   Diagnosis Date Noted  . Allergic rhinitis 11/17/2014  . Anxiety 09/08/2014  . Acute asthma exacerbation 09/08/2014  . Affective bipolar disorder ([place]) 09/08/2014  . 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/08/2014  . Headache, migraine 09/08/2014  . Excess, menstruation 09/08/2014  . Extreme obesity 09/08/2014    Past Surgical History:  Procedure Laterality Date  . ANKLE FRACTURE SURGERY Right 1992  . CHOLECYSTECTOMY      Prior to Admission medications   Medication Sig Start Date End Date Taking? Authorizing Provider  ADVAIR DISKUS 250-50 MCG/DOSE AEPB INHALE ONE DOSE BY MOUTH TWICE DAILY Patient not taking: Reported on 06/30/2016 12/03/15   Mar Daring, PA-C  albuterol (PROVENTIL  HFA;VENTOLIN HFA) 108 (90 Base) MCG/ACT inhaler Inhale 2 puffs into the lungs every 6 (six) hours as needed for wheezing or shortness of breath. 06/30/16   Mar Daring, PA-C  ALPRAZolam Duanne Moron) 1 MG tablet Take 1 tablet by mouth 3 (three) times daily as needed. 03/23/15   [provider]  HYDROcodone-homatropine (HYCODAN) 5-1.5 MG/5ML syrup Take 5 mLs by mouth every 8 (eight) hours as needed for cough. 06/30/16   Mar Daring, PA-C  lamoTRIgine (LAMICTAL) 200 MG tablet Take 200 mg by mouth 2 (two) times daily.     [provider]  levalbuterol Penne Lash) 0.63 MG/3ML nebulizer solution Take 3 mLs (0.63 mg total) by nebulization every 6 (six) hours as needed for wheezing or shortness of breath. 06/30/16   Mar Daring, PA-C  lithium carbonate (ESKALITH) 450 MG CR tablet Take 1 tablet by mouth daily. 04/06/15   [provider]  montelukast (SINGULAIR) 10 MG tablet TAKE 1 TABLET BY MOUTH ONCE DAILY 07/23/17   Mar Daring, PA-C  predniSONE (DELTASONE) 10 MG tablet Take 6 tabs PO on day 1&2, 5 tabs PO on day 3&4, 4 tabs PO on day 5&6, 3 tabs PO on day 7&8, 2 tabs PO on day 9&10, 1 tab PO on day 11&12. 06/30/16   Mar Daring, PA-C  Vitamin D, Ergocalciferol, (DRISDOL) 50000 units CAPS capsule  10/08/15   [provider]    Allergies Patient has no known allergies.  Family History  Problem Relation Age of Onset  . COPD Mother   .  Hyperlipidemia Mother   . Hyperlipidemia Father   . Leukemia Father   . COPD Father   . Depression Sister        ANXIETY  . Healthy Sister     Social History Social History   Tobacco Use  . Smoking status: Former Smoker    Last attempt to quit: 03/17/2004    Years since quitting: 13.5  . Smokeless tobacco: Never Used  Substance Use Topics  . Alcohol use: No  . Drug use: No    Review of Systems Constitutional: Positive for subjective fevers Eyes: No visual changes. ENT: No sore  throat. Cardiovascular: Positive for chest pain. Respiratory: Positive for shortness of breath. Gastrointestinal: No abdominal pain.  No nausea, no vomiting.  No diarrhea.   Genitourinary: Negative for dysuria. Musculoskeletal: Negative for back pain. Skin: Negative for rash. Neurological: Negative for headaches, focal weakness or numbness.  ____________________________________________   PHYSICAL EXAM:  VITAL SIGNS: ED Triage Vitals [09/28/17 2020]  Enc Vitals Group     BP (!) 157/87     Pulse Rate 88     Resp 18     Temp 98.4 F (36.9 C)     Temp Source Oral     SpO2 96 %     Weight 263 lb (119.3 kg)     Height 5\' 10"  (1.778 m)     Head Circumference      Peak Flow      Pain Score 8   Constitutional: Alert and oriented.  Eyes: Conjunctivae are normal.  ENT      Head: Normocephalic and atraumatic.      Nose: No congestion/rhinnorhea.      Mouth/Throat: Mucous membranes are moist.      Neck: No stridor. Hematological/Lymphatic/Immunilogical: No cervical lymphadenopathy. Cardiovascular: Normal rate, regular rhythm.  No murmurs, rubs, or gallops.  Respiratory: Slightly increased respiratory effort with diffuse expiratory wheezing. Gastrointestinal: Soft and non tender. No rebound. No guarding.  Genitourinary: Deferred Musculoskeletal: Normal range of motion in all extremities. No lower extremity edema. Neurologic:  Normal speech and language. No gross focal neurologic deficits are appreciated.  Skin:  Skin is warm, dry and intact. No rash noted. Psychiatric: Mood and affect are normal. Speech and behavior are normal. Patient exhibits appropriate insight and judgment.  ____________________________________________    LABS (pertinent positives/negatives)  Upreg negative CBC wbc 7.6, hgb 14.1, plt 256 Trop <0.03 BMP wnl except glu 104, cr 0.42 UA hazy, moderate hgb, 0-5 rbcs and wbcs, negative leukocytes and nitrites, rare  bacteria  ____________________________________________   EKG  I, Nance Pear, attending physician, personally viewed and interpreted this EKG  EKG Time: 2023 Rate: 88 Rhythm: normal sinus rhythm Axis: normal Intervals: qtc 440 QRS: narrow ST changes: no st elevation Impression: normal ekg   ____________________________________________    RADIOLOGY  CXR No acute disease   ____________________________________________   PROCEDURES  Procedures  ____________________________________________   INITIAL IMPRESSION / ASSESSMENT AND PLAN / ED COURSE  Pertinent labs & imaging results that were available during my care of the patient were reviewed by me and considered in my medical decision making (see chart for details).   Patient presented because of concern for shortness of breath and chest discomfort. Has history of asthma. Exam is notable for diffuse expiratory wheezing. cxr without obvious infiltrate. Will give steroids, breathing treatments and magnesium.    ____________________________________________   FINAL CLINICAL IMPRESSION(S) / ED DIAGNOSES  Final diagnoses:  Exacerbation of asthma, unspecified asthma severity, unspecified whether persistent  Note: This dictation was prepared with Dragon dictation. Any transcriptional errors that result from this process are unintentional     Nance Pear, MD 09/29/17 1642

## 2017-09-29 ENCOUNTER — Other Ambulatory Visit: Payer: Self-pay

## 2017-09-29 DIAGNOSIS — J45901 Unspecified asthma with (acute) exacerbation: Secondary | ICD-10-CM | POA: Diagnosis present

## 2017-09-29 LAB — TSH: TSH: 2.508 u[IU]/mL (ref 0.350–4.500)

## 2017-09-29 MED ORDER — KETOROLAC TROMETHAMINE 30 MG/ML IJ SOLN
30.0000 mg | Freq: Four times a day (QID) | INTRAMUSCULAR | Status: DC | PRN
  Administered 2017-09-29: 30 mg via INTRAVENOUS
  Filled 2017-09-29: qty 1

## 2017-09-29 MED ORDER — MAGNESIUM OXIDE 400 (241.3 MG) MG PO TABS
400.0000 mg | ORAL_TABLET | Freq: Every evening | ORAL | Status: DC
  Administered 2017-09-29: 18:00:00 400 mg via ORAL
  Filled 2017-09-29: qty 1

## 2017-09-29 MED ORDER — MONTELUKAST SODIUM 10 MG PO TABS
10.0000 mg | ORAL_TABLET | ORAL | Status: DC
  Administered 2017-09-29 – 2017-09-30 (×2): 10 mg via ORAL
  Filled 2017-09-29 (×2): qty 1

## 2017-09-29 MED ORDER — ONDANSETRON HCL 4 MG/2ML IJ SOLN: 4.0000 mg | Freq: Four times a day (QID) | INTRAMUSCULAR | Status: DC | PRN

## 2017-09-29 MED ORDER — IBUPROFEN 400 MG PO TABS: 400.0000 mg | ORAL_TABLET | Freq: Four times a day (QID) | ORAL | Status: DC | PRN

## 2017-09-29 MED ORDER — PREDNISONE 20 MG PO TABS: 20.0000 mg | ORAL_TABLET | Freq: Every day | ORAL | Status: DC

## 2017-09-29 MED ORDER — DOXYCYCLINE HYCLATE 100 MG PO TABS
100.0000 mg | ORAL_TABLET | Freq: Two times a day (BID) | ORAL | Status: DC
  Administered 2017-09-29 – 2017-09-30 (×3): 100 mg via ORAL
  Filled 2017-09-29 (×4): qty 1

## 2017-09-29 MED ORDER — DOCUSATE SODIUM 100 MG PO CAPS
100.0000 mg | ORAL_CAPSULE | Freq: Two times a day (BID) | ORAL | Status: DC
  Administered 2017-09-29 – 2017-09-30 (×3): 100 mg via ORAL
  Filled 2017-09-29 (×3): qty 1

## 2017-09-29 MED ORDER — ACETAMINOPHEN 325 MG PO TABS
650.0000 mg | ORAL_TABLET | Freq: Four times a day (QID) | ORAL | Status: DC | PRN
  Administered 2017-09-29: 13:00:00 650 mg via ORAL
  Filled 2017-09-29: qty 2

## 2017-09-29 MED ORDER — ZOLPIDEM TARTRATE 5 MG PO TABS
5.0000 mg | ORAL_TABLET | Freq: Every evening | ORAL | Status: DC | PRN
Start: 2017-09-29 — End: 2017-09-30
  Administered 2017-09-29: 5 mg via ORAL
  Filled 2017-09-29: qty 1

## 2017-09-29 MED ORDER — ALPRAZOLAM 1 MG PO TABS
1.0000 mg | ORAL_TABLET | Freq: Three times a day (TID) | ORAL | Status: DC | PRN
  Administered 2017-09-29 (×2): 1 mg via ORAL
  Filled 2017-09-29 (×2): qty 1

## 2017-09-29 MED ORDER — VITAMIN D (ERGOCALCIFEROL) 1.25 MG (50000 UNIT) PO CAPS: 50000.0000 [IU] | ORAL_CAPSULE | ORAL | Status: DC

## 2017-09-29 MED ORDER — PREDNISONE 20 MG PO TABS: 30.0000 mg | ORAL_TABLET | Freq: Every day | ORAL | Status: DC

## 2017-09-29 MED ORDER — FERROUS SULFATE 325 (65 FE) MG PO TABS
325.0000 mg | ORAL_TABLET | Freq: Every day | ORAL | Status: DC
  Administered 2017-09-29 – 2017-09-30 (×2): 325 mg via ORAL
  Filled 2017-09-29 (×2): qty 1

## 2017-09-29 MED ORDER — ONDANSETRON HCL 4 MG PO TABS: 4.0000 mg | ORAL_TABLET | Freq: Four times a day (QID) | ORAL | Status: DC | PRN

## 2017-09-29 MED ORDER — KETOROLAC TROMETHAMINE 30 MG/ML IJ SOLN
30.0000 mg | Freq: Once | INTRAMUSCULAR | Status: AC
  Administered 2017-09-29: 30 mg via INTRAVENOUS
  Filled 2017-09-29: qty 1

## 2017-09-29 MED ORDER — METHYLPREDNISOLONE SODIUM SUCC 125 MG IJ SOLR: 60.0000 mg | Freq: Four times a day (QID) | INTRAMUSCULAR | Status: DC

## 2017-09-29 MED ORDER — PREDNISONE 50 MG PO TABS
50.0000 mg | ORAL_TABLET | Freq: Every day | ORAL | Status: AC
  Administered 2017-09-29: 50 mg via ORAL
  Filled 2017-09-29: qty 1

## 2017-09-29 MED ORDER — METHYLPREDNISOLONE SODIUM SUCC 125 MG IJ SOLR
60.0000 mg | Freq: Four times a day (QID) | INTRAMUSCULAR | Status: DC
Start: 2017-09-29 — End: 2017-09-30
  Administered 2017-09-29 – 2017-09-30 (×4): 60 mg via INTRAVENOUS
  Filled 2017-09-29 (×3): qty 2

## 2017-09-29 MED ORDER — FLUTICASONE PROPIONATE 50 MCG/ACT NA SUSP
2.0000 | Freq: Every day | NASAL | Status: DC
  Administered 2017-09-29 – 2017-09-30 (×2): 2 via NASAL
  Filled 2017-09-29: qty 16

## 2017-09-29 MED ORDER — MORPHINE SULFATE (PF) 2 MG/ML IV SOLN
2.0000 mg | Freq: Once | INTRAVENOUS | Status: AC
  Administered 2017-09-29: 2 mg via INTRAVENOUS
  Filled 2017-09-29: qty 1

## 2017-09-29 MED ORDER — PREDNISONE 10 MG PO TABS: 10.0000 mg | ORAL_TABLET | Freq: Every day | ORAL | Status: DC

## 2017-09-29 MED ORDER — ADULT MULTIVITAMIN W/MINERALS CH
1.0000 | ORAL_TABLET | Freq: Every day | ORAL | Status: DC
  Administered 2017-09-29 – 2017-09-30 (×2): 1 via ORAL
  Filled 2017-09-29 (×2): qty 1

## 2017-09-29 MED ORDER — SODIUM CHLORIDE 0.9 % IV SOLN
INTRAVENOUS | Status: AC
  Administered 2017-09-29 (×3): via INTRAVENOUS

## 2017-09-29 MED ORDER — FLUTICASONE FUROATE-VILANTEROL 200-25 MCG/INH IN AEPB
1.0000 | INHALATION_SPRAY | Freq: Every day | RESPIRATORY_TRACT | Status: DC
  Administered 2017-09-29: 1 via RESPIRATORY_TRACT
  Filled 2017-09-29: qty 28

## 2017-09-29 MED ORDER — LORATADINE 10 MG PO TABS: 10.0000 mg | ORAL_TABLET | Freq: Every day | ORAL | Status: DC | PRN

## 2017-09-29 MED ORDER — BUDESONIDE 0.25 MG/2ML IN SUSP
0.2500 mg | Freq: Two times a day (BID) | RESPIRATORY_TRACT | Status: DC
  Administered 2017-09-29 – 2017-09-30 (×2): 0.25 mg via RESPIRATORY_TRACT
  Filled 2017-09-29 (×2): qty 2

## 2017-09-29 MED ORDER — ENOXAPARIN SODIUM 40 MG/0.4ML ~~LOC~~ SOLN
40.0000 mg | SUBCUTANEOUS | Status: DC
  Administered 2017-09-29 – 2017-09-30 (×2): 40 mg via SUBCUTANEOUS
  Filled 2017-09-29 (×2): qty 0.4

## 2017-09-29 MED ORDER — IPRATROPIUM-ALBUTEROL 0.5-2.5 (3) MG/3ML IN SOLN
3.0000 mL | Freq: Once | RESPIRATORY_TRACT | Status: AC
  Administered 2017-09-29: 3 mL via RESPIRATORY_TRACT
  Filled 2017-09-29: qty 3

## 2017-09-29 MED ORDER — PREDNISONE 20 MG PO TABS: 40.0000 mg | ORAL_TABLET | Freq: Every day | ORAL | Status: DC

## 2017-09-29 MED ORDER — IPRATROPIUM-ALBUTEROL 0.5-2.5 (3) MG/3ML IN SOLN: 3.0000 mL | Freq: Four times a day (QID) | RESPIRATORY_TRACT | Status: DC | PRN

## 2017-09-29 MED ORDER — PREDNISONE 10 MG PO TABS: 5.0000 mg | ORAL_TABLET | Freq: Every day | ORAL | Status: DC

## 2017-09-29 MED ORDER — ACETAMINOPHEN 650 MG RE SUPP: 650.0000 mg | Freq: Four times a day (QID) | RECTAL | Status: DC | PRN

## 2017-09-29 MED ORDER — ALBUTEROL SULFATE (2.5 MG/3ML) 0.083% IN NEBU
2.5000 mg | INHALATION_SOLUTION | RESPIRATORY_TRACT | Status: DC
  Administered 2017-09-29 – 2017-09-30 (×8): 2.5 mg via RESPIRATORY_TRACT
  Filled 2017-09-29 (×8): qty 3

## 2017-09-29 NOTE — ED Provider Notes (Signed)
I assumed care of the patient from Dr. Archie Balboa 11:30 PM.  On reevaluation patient continues to have coarse expiratory wheezes with increased work of breathing.  Patient given an additional DuoNeb.  Patient discussed with Dr. Jannifer Franklin for hospital admission further evaluation and management of acute asthma exacerbation.   Gregor Hams, MD 09/29/17 [id number]

## 2017-09-29 NOTE — Progress Notes (Signed)
[place] at [facility]                                                                                                                                                                                  Patient Demographics   Megan Jimenez, is a 52 y.o. female, DOB - 1965-12-16, CHE:[id number]  Admit date - 09/28/2017   Admitting Physician Harrie Foreman, MD  Outpatient Primary MD for the patient is Mar Daring, PA-C   LOS - 0  Subjective: Patient continues to be short of breath and significant wheezing.  She also complains of cough.    Review of Systems:   CONSTITUTIONAL: No documented fever. No fatigue, weakness. No weight gain, no weight loss.  EYES: No blurry or double vision.  ENT: No tinnitus. No postnasal drip. No redness of the oropharynx.  RESPIRATORY: Positive cough, no wheeze, no hemoptysis.  Positive dyspnea.  CARDIOVASCULAR: No chest pain. No orthopnea. No palpitations. No syncope.  GASTROINTESTINAL: No nausea, no vomiting or diarrhea. No abdominal pain. No melena or hematochezia.  GENITOURINARY: No dysuria or hematuria.  ENDOCRINE: No polyuria or nocturia. No heat or cold intolerance.  HEMATOLOGY: No anemia. No bruising. No bleeding.  INTEGUMENTARY: No rashes. No lesions.  MUSCULOSKELETAL: No arthritis. No swelling. No gout.  NEUROLOGIC: No numbness, tingling, or ataxia. No seizure-type activity.  PSYCHIATRIC: No anxiety. No insomnia. No ADD.    Vitals:   Vitals:   09/29/17 0500 09/29/17 0530 09/29/17 0609 09/29/17 0731  BP: 127/78 120/66 (!) 169/91   Pulse: 98 95 88   Resp:   20   Temp:   98.9 F (37.2 C)   TempSrc:   Oral   SpO2: (!) 89% 92% 96% 96%  Weight:   119.1 kg (262 lb 8 oz)   Height:   5\' 10"  (1.778 m)     Wt Readings from Last 3 Encounters:  09/29/17 119.1 kg (262 lb 8 oz)  06/30/16 116.8 kg (257 lb 9.6 oz)  10/25/15 121.6 kg (268 lb)     Intake/Output Summary (Last 24 hours) at 09/29/2017 1258 Last  data filed at 09/29/2017 0959 Gross per 24 hour  Intake 240 ml  Output -  Net 240 ml    Physical Exam:   GENERAL: Pleasant-appearing in no apparent distress.  HEAD, EYES, EARS, NOSE AND THROAT: Atraumatic, normocephalic. Extraocular muscles are intact. Pupils equal and reactive to light. Sclerae anicteric. No conjunctival injection. No oro-pharyngeal erythema.  NECK: Supple. There is no jugular venous distention. No bruits, no lymphadenopathy, no thyromegaly.  HEART: Regular rate and rhythm,. No murmurs, no rubs, no clicks.  LUNGS: Bilateral wheezing throughout both lung ABDOMEN:  Soft, flat, nontender, nondistended. Has good bowel sounds. No hepatosplenomegaly appreciated.  EXTREMITIES: No evidence of any cyanosis, clubbing, or peripheral edema.  +2 pedal and radial pulses bilaterally.  NEUROLOGIC: The patient is alert, awake, and oriented x3 with no focal motor or sensory deficits appreciated bilaterally.  SKIN: Moist and warm with no rashes appreciated.  Psych: Not anxious, depressed LN: No inguinal LN enlargement    Antibiotics   Anti-infectives (From admission, onward)   Start     Dose/Rate Route Frequency Ordered Stop   09/29/17 1300  doxycycline (VIBRA-TABS) tablet 100 mg     100 mg Oral Every 12 hours 09/29/17 1258        Medications   Scheduled Meds: . albuterol  2.5 mg Nebulization Q4H  . budesonide (PULMICORT) nebulizer solution  0.25 mg Nebulization BID  . docusate sodium  100 mg Oral BID  . doxycycline  100 mg Oral Q12H  . enoxaparin (LOVENOX) injection  40 mg Subcutaneous Q24H  . ferrous sulfate  325 mg Oral Q breakfast  . fluticasone  2 spray Each Nare Daily  . magnesium oxide  400 mg Oral QPM  . methylPREDNISolone (SOLU-MEDROL) injection  60 mg Intravenous Q6H  . montelukast  10 mg Oral BH-q7a  . multivitamin with minerals  1 tablet Oral Daily  . [START ON 10/02/2017] Vitamin D (Ergocalciferol)  50,000 Units Oral Q Fri   Continuous Infusions: . sodium  chloride 125 mL/hr at 09/29/17 0801   PRN Meds:.acetaminophen **OR** acetaminophen, ALPRAZolam, ipratropium-albuterol, loratadine, ondansetron **OR** ondansetron (ZOFRAN) IV   Data Review:   Micro Results No results found for this or any previous visit (from the past 240 hour(s)).  Radiology Reports Dg Chest 2 View  Result Date: 09/28/2017 CLINICAL DATA:  Centralized chest pain with left arm numbness and dyspnea x1 week. History of asthma. EXAM: CHEST - 2 VIEW COMPARISON:  02/27/2014 FINDINGS: Hyperinflated lungs. No CHF or pulmonary consolidations. No effusion or pneumothorax. Normal size heart. Nonaneurysmal thoracic aorta. No acute osseous abnormality. IMPRESSION: Hyperinflated lungs without active pulmonary disease. Electronically Signed   By: Ashley Royalty M.D.   On: 09/28/2017 21:00     CBC Recent Labs  Lab 09/28/17 2027  WBC 7.6  HGB 14.1  HCT 40.3  PLT 256  MCV 93.9  MCH 33.0  MCHC 35.1  RDW 13.4    Chemistries  Recent Labs  Lab 09/28/17 2027  NA 138  K 4.1  CL 104  CO2 27  GLUCOSE 104*  BUN 12  CREATININE 0.42*  CALCIUM 8.9   ------------------------------------------------------------------------------------------------------------------ estimated creatinine clearance is 113.9 mL/min (A) (by C-G formula based on SCr of 0.42 mg/dL (L)). ------------------------------------------------------------------------------------------------------------------ No results for input(s): HGBA1C in the last 72 hours. ------------------------------------------------------------------------------------------------------------------ No results for input(s): CHOL, HDL, LDLCALC, TRIG, CHOLHDL, LDLDIRECT in the last 72 hours. ------------------------------------------------------------------------------------------------------------------ Recent Labs    09/28/17 2027  TSH 2.508    ------------------------------------------------------------------------------------------------------------------ No results for input(s): VITAMINB12, FOLATE, FERRITIN, TIBC, IRON, RETICCTPCT in the last 72 hours.  Coagulation profile No results for input(s): INR, PROTIME in the last 168 hours.  No results for input(s): DDIMER in the last 72 hours.  Cardiac Enzymes Recent Labs  Lab 09/28/17 2027  TROPONINI <0.03   ------------------------------------------------------------------------------------------------------------------ Invalid input(s): POCBNP    Assessment & Plan   This is a 52 year old female admitted for asthma exacerbation 1.    Acute asthma Uncontrolle with exasperation  I will  add Solu-Medrol, change to Pulmicort Continue nebulized, for bronchitis add doxycycline Patient likely has  seasonal allergies add Flonase 2.  Bipolar 1 disorder continue alprazolam 3.  Obesity: BMI is 37.6; encourage healthy diet and exercise 4.  DVT prophylaxis: Lovenox 5.  GI prophylaxis: None       Code Status Orders  (From admission, onward)        Start     Ordered   09/29/17 0609  Full code  Continuous     09/29/17 0608    Code Status History    This patient has a current code status but no historical code status.           Consults none  DVT Prophylaxis  Lovenox   Lab Results  Component Value Date   PLT 256 09/28/2017     Time Spent in minutes31min Greater than 50% of time spent in care coordination and counseling patient regarding the condition and plan of care.   Dustin Flock M.D on 09/29/2017 at 12:58 PM  Between 7am to 6pm - Pager - [phone]  After 6pm go to [email or web address] - Proofreader  [employer]   Office  [phone]

## 2017-09-29 NOTE — ED Notes (Addendum)
Pt requested something to eat and drink. MD and nurse notified. This tech gave pt meal tray and drink. No other needs voiced at this time.

## 2017-09-29 NOTE — H&P (Signed)
Megan Jimenez is an 52 y.o. female.   Chief Complaint: Shortness of breath HPI: The patient with past medical history of asthma presents to the emergency department complaining of shortness of breath.  The patient states that she has had dyspnea and cough for at least a week.  She reports posterior chest pain particularly when she has an episode of shortness of breath.  The patient received multiple breathing treatments in the emergency department but continued to have wheezing and oxygen desaturations to 87% on room air which prompted the emergency department staff called the hospitalist service for further management.  Past Medical History:  Diagnosis Date  . Asthma   . Bipolar 1 disorder [facility])     Past Surgical History:  Procedure Laterality Date  . ANKLE FRACTURE SURGERY Right 1992  . CHOLECYSTECTOMY      Family History  Problem Relation Age of Onset  . COPD Mother   . Hyperlipidemia Mother   . Hyperlipidemia Father   . Leukemia Father   . COPD Father   . Depression Sister        ANXIETY  . Healthy Sister    Social History:  reports that she quit smoking about 13 years ago. She has never used smokeless tobacco. She reports that she does not drink alcohol or use drugs.  Allergies: No Known Allergies  Medications Prior to Admission  Medication Sig Dispense Refill  . albuterol (PROVENTIL HFA;VENTOLIN HFA) 108 (90 Base) MCG/ACT inhaler Inhale 2 puffs into the lungs every 6 (six) hours as needed for wheezing or shortness of breath. 1 Inhaler 5  . ALPRAZolam (XANAX) 1 MG tablet Take 1 mg by mouth 3 (three) times daily as needed for anxiety.    . ferrous sulfate 325 (65 FE) MG tablet Take 325 mg by mouth daily with breakfast.    . loratadine (CLARITIN) 10 MG tablet Take 10 mg by mouth daily as needed for allergies.    . magnesium oxide (MAG-OX) 400 MG tablet Take 400 mg by mouth every evening.    . montelukast (SINGULAIR) 10 MG tablet Take 10 mg by mouth every morning.    .  Multiple Vitamin (MULTIVITAMIN WITH MINERALS) TABS tablet Take 1 tablet by mouth daily.    . Vitamin D, Ergocalciferol, (DRISDOL) 50000 units CAPS capsule Take 50,000 Units by mouth every Friday.   5    Results for orders placed or performed during the hospital encounter of 09/28/17 (from the past 48 hour(s))  Basic metabolic panel     Status: Abnormal   Collection Time: 09/28/17  8:27 PM  Result Value Ref Range   Sodium 138 135 - 145 mmol/L   Potassium 4.1 3.5 - 5.1 mmol/L   Chloride 104 98 - 111 mmol/L    Comment: Please note change in reference range.   CO2 27 22 - 32 mmol/L   Glucose, Bld 104 (H) 70 - 99 mg/dL    Comment: Please note change in reference range.   BUN 12 6 - 20 mg/dL    Comment: Please note change in reference range.   Creatinine, Ser 0.42 (L) 0.44 - 1.00 mg/dL   Calcium 8.9 8.9 - 10.3 mg/dL   GFR calc non Af Amer >60 >60 mL/min   GFR calc Af Amer >60 >60 mL/min    Comment: (NOTE) The eGFR has been calculated using the CKD EPI equation. This calculation has not been validated in all clinical situations. eGFR's persistently <60 mL/min signify possible Chronic Kidney Disease.  Anion gap 7 5 - 15    Comment: Performed at [facility], [address] [place], [place] [address]  CBC     Status: None   Collection Time: 09/28/17  8:27 PM  Result Value Ref Range   WBC 7.6 3.6 - 11.0 K/uL   RBC 4.29 3.80 - 5.20 MIL/uL   Hemoglobin 14.1 12.0 - 16.0 g/dL   HCT 40.3 35.0 - 47.0 %   MCV 93.9 80.0 - 100.0 fL   MCH 33.0 26.0 - 34.0 pg   MCHC 35.1 32.0 - 36.0 g/dL   RDW 13.4 11.5 - 14.5 %   Platelets 256 150 - 440 K/uL    Comment: Performed at [facility], [address] [place], [place] [address]  Troponin I     Status: None   Collection Time: 09/28/17  8:27 PM  Result Value Ref Range   Troponin I <0.03 <0.03 ng/mL    Comment: Performed at [facility], [address] [place], [place] [address]  Urinalysis, Complete w Microscopic      Status: Abnormal   Collection Time: 09/28/17  8:27 PM  Result Value Ref Range   Color, Urine AMBER (A) YELLOW    Comment: BIOCHEMICALS MAY BE AFFECTED BY COLOR   APPearance HAZY (A) CLEAR   Specific Gravity, Urine 1.020 1.005 - 1.030   pH 5.0 5.0 - 8.0   Glucose, UA NEGATIVE NEGATIVE mg/dL   Hgb urine dipstick MODERATE (A) NEGATIVE   Bilirubin Urine NEGATIVE NEGATIVE   Ketones, ur 5 (A) NEGATIVE mg/dL   Protein, ur NEGATIVE NEGATIVE mg/dL   Nitrite NEGATIVE NEGATIVE   Leukocytes, UA NEGATIVE NEGATIVE   RBC / HPF 0-5 0 - 5 RBC/hpf   WBC, UA 0-5 0 - 5 WBC/hpf   Bacteria, UA RARE (A) NONE SEEN   Squamous Epithelial / LPF 11-20 0 - 5   Mucus PRESENT    Hyaline Casts, UA PRESENT     Comment: Performed at [facility], [address] [place], [place] [address]  Pregnancy, urine POC     Status: None   Collection Time: 09/28/17  8:36 PM  Result Value Ref Range   Preg Test, Ur NEGATIVE NEGATIVE    Comment:        THE SENSITIVITY OF THIS METHODOLOGY IS >24 mIU/mL    Dg Chest 2 View  Result Date: 09/28/2017 CLINICAL DATA:  Centralized chest pain with left arm numbness and dyspnea x1 week. History of asthma. EXAM: CHEST - 2 VIEW COMPARISON:  02/27/2014 FINDINGS: Hyperinflated lungs. No CHF or pulmonary consolidations. No effusion or pneumothorax. Normal size heart. Nonaneurysmal thoracic aorta. No acute osseous abnormality. IMPRESSION: Hyperinflated lungs without active pulmonary disease. Electronically Signed   By: Ashley Royalty M.D.   On: 09/28/2017 21:00    Review of Systems  Constitutional: Negative for chills and fever.  HENT: Negative for sore throat and tinnitus.   Eyes: Negative for blurred vision and redness.  Respiratory: Positive for shortness of breath and wheezing. Negative for cough.   Cardiovascular: Positive for chest pain (posterior). Negative for palpitations, orthopnea and PND.  Gastrointestinal: Negative for abdominal pain, diarrhea, nausea and vomiting.   Genitourinary: Negative for dysuria, frequency and urgency.  Musculoskeletal: Negative for joint pain and myalgias.  Skin: Negative for rash.       No lesions  Neurological: Negative for speech change, focal weakness and weakness.  Endo/Heme/Allergies: Does not bruise/bleed easily.       No temperature  intolerance  Psychiatric/Behavioral: Negative for depression and suicidal ideas.    Blood pressure (!) 169/91, pulse 88, temperature 98.9 F (37.2 C), temperature source Oral, resp. rate 20, height '5\' 10"'$  (1.778 m), weight 119.1 kg (262 lb 8 oz), SpO2 96 %. Physical Exam  Vitals reviewed. Constitutional: She is oriented to person, place, and time. She appears well-developed and well-nourished. No distress.  HENT:  Head: Normocephalic and atraumatic.  Mouth/Throat: Oropharynx is clear and moist.  Eyes: Pupils are equal, round, and reactive to light. Conjunctivae and EOM are normal. No scleral icterus.  Neck: Normal range of motion. Neck supple. No JVD present. No tracheal deviation present. No thyromegaly present.  Cardiovascular: Normal rate, regular rhythm and normal heart sounds. Exam reveals no gallop and no friction rub.  No murmur heard. Respiratory: She is in respiratory distress. She has wheezes.  GI: Soft. Bowel sounds are normal. She exhibits no distension. There is no tenderness.  Genitourinary:  Genitourinary Comments: Deferred  Musculoskeletal: Normal range of motion. She exhibits no edema.  Lymphadenopathy:    She has no cervical adenopathy.  Neurological: She is alert and oriented to person, place, and time. No cranial nerve deficit. She exhibits normal muscle tone.  Skin: Skin is warm and dry. No rash noted. No erythema.  Psychiatric: She has a normal mood and affect. Her behavior is normal. Judgment and thought content normal.     Assessment/Plan This is a 52 year old female admitted for asthma exacerbation 1.  Asthma: Uncontrolled; chest pain secondary to  increased work of breathing.  No evidence of myocardial ischemia.  The patient has received Solu-Medrol as well as multiple breathing treatments.  Steroid taper and schedule albuterol with DuoNeb treatments as needed.  Start inhaled corticosteroid with long-acting bronchial agonist. 2.  Obesity: BMI is 37.6; encourage healthy diet and exercise 3.  Mild dehydration: Based on urinalysis.  Hydrate with intravenous fluid.  Encourage p.o. intake. 4.  DVT prophylaxis: Lovenox 5.  GI prophylaxis: None The patient is a full code.  Time spent on admission orders and patient care approximately 45 minutes  Harrie Foreman, MD 09/29/2017, 7:02 AM

## 2017-09-29 NOTE — Plan of Care (Signed)

## 2017-09-30 LAB — CBC
HCT: 36.9 % (ref 35.0–47.0)
Hemoglobin: 12.5 g/dL (ref 12.0–16.0)
MCH: 32.7 pg (ref 26.0–34.0)
MCHC: 34 g/dL (ref 32.0–36.0)
MCV: 96.2 fL (ref 80.0–100.0)
Platelets: 215 10*3/uL (ref 150–440)
RBC: 3.84 MIL/uL (ref 3.80–5.20)
RDW: 13.8 % (ref 11.5–14.5)
WBC: 17.4 10*3/uL — ABNORMAL HIGH (ref 3.6–11.0)

## 2017-09-30 LAB — BASIC METABOLIC PANEL
Anion gap: 7 (ref 5–15)
BUN: 13 mg/dL (ref 6–20)
CO2: 25 mmol/L (ref 22–32)
Calcium: 8.5 mg/dL — ABNORMAL LOW (ref 8.9–10.3)
Chloride: 108 mmol/L (ref 98–111)
Creatinine, Ser: 0.44 mg/dL (ref 0.44–1.00)
GFR calc Af Amer: >60 mL/min (ref >60)
GFR calc non Af Amer: >60 mL/min (ref >60)
Glucose, Bld: 182 mg/dL — ABNORMAL HIGH (ref 70–99)
Potassium: 4.2 mmol/L (ref 3.5–5.1)
Sodium: 140 mmol/L (ref 135–145)

## 2017-09-30 MED ORDER — DOXYCYCLINE HYCLATE 100 MG PO TABS: 100.0000 mg | ORAL_TABLET | Freq: Two times a day (BID) | ORAL | 0 refills | Status: AC

## 2017-09-30 MED ORDER — MOMETASONE FURO-FORMOTEROL FUM 200-5 MCG/ACT IN AERO: 2.0000 | INHALATION_SPRAY | Freq: Two times a day (BID) | RESPIRATORY_TRACT | 2 refills | Status: DC

## 2017-09-30 MED ORDER — PREDNISONE 10 MG (21) PO TBPK: ORAL_TABLET | ORAL | 0 refills | Status: DC

## 2017-09-30 MED ORDER — IPRATROPIUM-ALBUTEROL 0.5-2.5 (3) MG/3ML IN SOLN: 3.0000 mL | Freq: Four times a day (QID) | RESPIRATORY_TRACT | 2 refills | Status: DC | PRN

## 2017-09-30 MED ORDER — FLUTICASONE PROPIONATE 50 MCG/ACT NA SUSP: 2.0000 | Freq: Every day | NASAL | 2 refills | Status: DC

## 2017-09-30 NOTE — Care Management Note (Signed)
Case Management Note  Patient Details  Name: Megan Jimenez MRN: [id number] Date of Birth: 02/05/1966  Subjective/Objective:                  Admitted to [facility] with the diagnosis of Asthma. Lives with husband, Megan Jimenez [phone]). Sees Dr. Fenton Malling at [facility]. Prescriptions are filled at [facility] on [address]. States she is running out of unemployment money next week. No insurance listed. Hopes to get get a job in a couple of weeks. Takes care of all basic activities of daily living herself.  Having trouble buying medications Lives in [place]. Telephone# [phone] Wants to follow-up at [facility]  Action/Plan: [facility] Medication Management given Will send referral to [facility]. Nebulizer per [facility]   Expected Discharge Date:  09/30/17               Expected Discharge Plan:     In-House Referral:   yes  Discharge planning Services   yes  Post Acute Care Choice:    Choice offered to:     DME Arranged:   yes DME Agency:   [facility]  HH Arranged:    HH Agency:     Status of Service:     If discussed at [place] of Stay Meetings, dates discussed:    Additional Comments:  Shelbie Ammons, RN MSN CCM Care Management [phone] 09/30/2017, 9:59 AM

## 2017-09-30 NOTE — Progress Notes (Signed)
Discharge instructions along with home medications and follow up gone over with patient. She verbalized that she understood instructions. All prescriptions given to patient. IV and tele removed. Pt being discharged home on room air, no distress noted. Ammie Dalton, RN

## 2017-09-30 NOTE — Discharge Summary (Signed)
[place] at [facility], 52 y.o., DOB Feb 10, 1966, MRN [id number]. Admission date: 09/28/2017 Discharge Date 09/30/2017 Primary MD Mar Daring, PA-C Admitting Physician Harrie Foreman, MD  Admission Diagnosis  Exacerbation of asthma, unspecified asthma severity, unspecified whether persistent [J45.901]  Discharge Diagnosis   Active Problems: Acute asthma exacerbation Acute bronchitis Bipolar 1 disorder Hypoglycemia reactive Obesity       Hospital Course  Patient is 52 year old female presented with shortness of breath and wheezing.  She has no history of COPD or asthma however has been having shortness of breath over the past few months.  She states that she has had breathing test in the past and has been told that she does not have COPD.  Patient admitted to the ER with shortness of breath was noted to have acute bronchospasm treated for acute bronchospasm with steroid nebulizers and antibiotics.  She is doing much better and stable for discharge.            Consults  None  Significant Tests:  See full reports for all details     Dg Chest 2 View  Result Date: 09/28/2017 CLINICAL DATA:  Centralized chest pain with left arm numbness and dyspnea x1 week. History of asthma. EXAM: CHEST - 2 VIEW COMPARISON:  02/27/2014 FINDINGS: Hyperinflated lungs. No CHF or pulmonary consolidations. No effusion or pneumothorax. Normal size heart. Nonaneurysmal thoracic aorta. No acute osseous abnormality. IMPRESSION: Hyperinflated lungs without active pulmonary disease. Electronically Signed   By: Ashley Royalty M.D.   On: 09/28/2017 21:00       Today   Subjective:   Megan Jimenez patient feeling much better wants to go home  Objective:   Blood pressure 128/85, pulse 100, temperature 97.9 F (36.6 C), temperature source Oral, resp. rate 18, height 5\' 10"  (1.778 m), weight 120.4 kg (265 lb 8 oz), SpO2 98 %.  .  Intake/Output  Summary (Last 24 hours) at 09/30/2017 1620 Last data filed at 09/29/2017 1910 Gross per 24 hour  Intake 240 ml  Output -  Net 240 ml    Exam VITAL SIGNS: Blood pressure 128/85, pulse 100, temperature 97.9 F (36.6 C), temperature source Oral, resp. rate 18, height 5\' 10"  (1.778 m), weight 120.4 kg (265 lb 8 oz), SpO2 98 %.  GENERAL:  52 y.o.-year-old patient lying in the bed with no acute distress.  EYES: Pupils equal, round, reactive to light and accommodation. No scleral icterus. Extraocular muscles intact.  HEENT: Head atraumatic, normocephalic. Oropharynx and nasopharynx clear.  NECK:  Supple, no jugular venous distention. No thyroid enlargement, no tenderness.  LUNGS: Normal breath sounds bilaterally, no wheezing, rales,rhonchi or crepitation. No use of accessory muscles of respiration.  CARDIOVASCULAR: S1, S2 normal. No murmurs, rubs, or gallops.  ABDOMEN: Soft, nontender, nondistended. Bowel sounds present. No organomegaly or mass.  EXTREMITIES: No pedal edema, cyanosis, or clubbing.  NEUROLOGIC: Cranial nerves II through XII are intact. Muscle strength 5/5 in all extremities. Sensation intact. Gait not checked.  PSYCHIATRIC: The patient is alert and oriented x 3.  SKIN: No obvious rash, lesion, or ulcer.   Data Review     CBC w Diff:  Lab Results  Component Value Date   WBC 17.4 (H) 09/30/2017   HGB 12.5 09/30/2017   HGB 13.0 10/26/2015   HCT 36.9 09/30/2017   HCT 39.2 10/26/2015   PLT 215 09/30/2017   PLT 294 10/26/2015   LYMPHOPCT 20.0 02/19/2013   MONOPCT 9.3 02/19/2013  EOSPCT 2.4 02/19/2013   BASOPCT 0.8 02/19/2013   CMP:  Lab Results  Component Value Date   NA 140 09/30/2017   NA 137 10/26/2015   NA 137 10/19/2013   K 4.2 09/30/2017   K 4.4 10/19/2013   CL 108 09/30/2017   CL 106 10/19/2013   CO2 25 09/30/2017   CO2 23 10/19/2013   BUN 13 09/30/2017   BUN 17 10/26/2015   BUN 12 10/19/2013   CREATININE 0.44 09/30/2017   CREATININE 0.53 (L)  10/19/2013   PROT 6.6 10/26/2015   PROT 7.3 02/19/2013   ALBUMIN 4.3 10/26/2015   ALBUMIN 3.7 02/19/2013   BILITOT 0.3 10/26/2015   BILITOT 0.3 02/19/2013   ALKPHOS 61 10/26/2015   ALKPHOS 77 02/19/2013   AST 12 10/26/2015   AST 22 02/19/2013   ALT 12 10/26/2015   ALT 24 02/19/2013  .  Micro Results No results found for this or any previous visit (from the past 240 hour(s)).      Code Status Orders  (From admission, onward)        Start     Ordered   09/29/17 0609  Full code  Continuous     09/29/17 0608    Code Status History    This patient has a current code status but no historical code status.          Follow-up Information    Mar Daring, PA-C. Go on 10/07/2017.   Specialty:  Family Medicine Why:  @10am  Contact information: [address] [place] [phone]           Discharge Medications   Allergies as of 09/30/2017   No Known Allergies     Medication List    TAKE these medications   albuterol 108 (90 Base) MCG/ACT inhaler Commonly known as:  PROVENTIL HFA;VENTOLIN HFA Inhale 2 puffs into the lungs every 6 (six) hours as needed for wheezing or shortness of breath.   ALPRAZolam 1 MG tablet Commonly known as:  XANAX Take 1 mg by mouth 3 (three) times daily as needed for anxiety.   doxycycline 100 MG tablet Commonly known as:  VIBRA-TABS Take 1 tablet (100 mg total) by mouth every 12 (twelve) hours for 5 days.   ferrous sulfate 325 (65 FE) MG tablet Take 325 mg by mouth daily with breakfast.   fluticasone 50 MCG/ACT nasal spray Commonly known as:  FLONASE Place 2 sprays into both nostrils daily.   ipratropium-albuterol 0.5-2.5 (3) MG/3ML Soln Commonly known as:  DUONEB Take 3 mLs by nebulization every 6 (six) hours as needed.   loratadine 10 MG tablet Commonly known as:  CLARITIN Take 10 mg by mouth daily as needed for allergies.   magnesium oxide 400 MG tablet Commonly known as:   MAG-OX Take 400 mg by mouth every evening.   mometasone-formoterol 200-5 MCG/ACT Aero Commonly known as:  DULERA Inhale 2 puffs into the lungs 2 (two) times daily.   montelukast 10 MG tablet Commonly known as:  SINGULAIR Take 10 mg by mouth every morning.   multivitamin with minerals Tabs tablet Take 1 tablet by mouth daily.   predniSONE 10 MG (21) Tbpk tablet Commonly known as:  STERAPRED UNI-PAK 21 TAB Start at 60mg  taper by 10mg  until complete   Vitamin D (Ergocalciferol) 50000 units Caps capsule Commonly known as:  DRISDOL Take 50,000 Units by mouth every Friday.            Durable Medical Equipment  (  From admission, onward)        Start     Ordered   09/30/17 0850  For home use only DME Nebulizer machine  Once    Question:  Patient needs a nebulizer to treat with the following condition  Answer:  COPD (chronic obstructive pulmonary disease) ([place])   09/30/17 0850         Total Time in preparing paper work, data evaluation and todays exam - 71 minutes  Dustin Flock M.D on 09/30/2017 at [place]  [phone]

## 2017-09-30 NOTE — Progress Notes (Signed)
[place] at [place] was admitted to the Hospital on 09/28/2017 and Discharged  09/30/2017 and should be excused from work/school   for 6  days starting 09/28/2017 , may return to work/school without any restrictions.  Call Dustin Flock MD with questions.  Dustin Flock M.D on 09/30/2017,at 8:49 AM  [place] at [facility]  [phone]

## 2017-10-01 ENCOUNTER — Telehealth: Payer: Self-pay

## 2017-10-05 NOTE — Telephone Encounter (Signed)
Tried to contact pt twice and LMTCB on both attempts. Pt has not returned my calls or voicemail. Hospital f/u scheduled for 10/07/17. FYI to PCP.  -MM

## 2017-10-05 NOTE — Telephone Encounter (Signed)
Noted  

## 2017-10-07 ENCOUNTER — Inpatient Hospital Stay: Payer: Self-pay | Admitting: Physician Assistant

## 2018-02-10 ENCOUNTER — Other Ambulatory Visit: Payer: Self-pay

## 2018-02-10 DIAGNOSIS — J301 Allergic rhinitis due to pollen: Secondary | ICD-10-CM

## 2018-02-10 MED ORDER — MONTELUKAST SODIUM 10 MG PO TABS: 10.0000 mg | ORAL_TABLET | ORAL | 1 refills | Status: DC

## 2018-02-24 ENCOUNTER — Encounter: Payer: Self-pay | Admitting: Family Medicine

## 2018-02-24 ENCOUNTER — Ambulatory Visit: Payer: Self-pay | Admitting: Family Medicine

## 2018-02-24 VITALS — BP 128/82 | HR 90 | Temp 98.8°F | Resp 20 | Wt 269.0 lb

## 2018-02-24 DIAGNOSIS — J329 Chronic sinusitis, unspecified: Secondary | ICD-10-CM

## 2018-02-24 DIAGNOSIS — J45901 Unspecified asthma with (acute) exacerbation: Secondary | ICD-10-CM

## 2018-02-24 MED ORDER — PREDNISONE 10 MG PO TABS: ORAL_TABLET | ORAL | 0 refills | Status: AC

## 2018-02-24 MED ORDER — AZITHROMYCIN 250 MG PO TABS: ORAL_TABLET | ORAL | 0 refills | Status: AC

## 2018-02-24 NOTE — Progress Notes (Signed)
Patient: Megan Jimenez Female    DOB: Jan 10, 1966   52 y.o.   MRN: [id number] Visit Date: 02/24/2018  Today's Provider: Lelon Huh, MD   Chief Complaint  Patient presents with  . URI   Subjective:    URI   This is a new problem. Episode onset: 2 days ago. The problem has been gradually worsening. There has been no fever. Associated symptoms include congestion, coughing (dry), ear pain (right ear), headaches, nausea, a plugged ear sensation, rhinorrhea, sneezing and wheezing. Pertinent negatives include no sore throat. She has tried NSAIDs (Ibuprofen 800mg  every 6 hours) for the symptoms. The treatment provided no relief.  States she woke up with nausea, headache, and fatigue yesterday. No vomiting. No fevers, chills or sweat. Having some left ear pain. No cough. Is having some wheezing.      No Known Allergies   Current Outpatient Medications:  .  albuterol (PROVENTIL HFA;VENTOLIN HFA) 108 (90 Base) MCG/ACT inhaler, Inhale 2 puffs into the lungs every 6 (six) hours as needed for wheezing or shortness of breath., Disp: 1 Inhaler, Rfl: 5 .  ALPRAZolam (XANAX) 1 MG tablet, Take 1 mg by mouth 3 (three) times daily as needed for anxiety., Disp: , Rfl:  .  ferrous sulfate 325 (65 FE) MG tablet, Take 325 mg by mouth daily with breakfast., Disp: , Rfl:  .  fluticasone (FLONASE) 50 MCG/ACT nasal spray, Place 2 sprays into both nostrils daily., Disp: 16 g, Rfl: 2 .  ipratropium-albuterol (DUONEB) 0.5-2.5 (3) MG/3ML SOLN, Take 3 mLs by nebulization every 6 (six) hours as needed., Disp: 360 mL, Rfl: 2 .  loratadine (CLARITIN) 10 MG tablet, Take 10 mg by mouth daily as needed for allergies., Disp: , Rfl:  .  magnesium oxide (MAG-OX) 400 MG tablet, Take 400 mg by mouth every evening., Disp: , Rfl:  .  mometasone-formoterol (DULERA) 200-5 MCG/ACT AERO, Inhale 2 puffs into the lungs 2 (two) times daily., Disp: 1 Inhaler, Rfl: 2 .  montelukast (SINGULAIR) 10 MG tablet, Take 1 tablet (10 mg  total) by mouth every morning., Disp: 90 tablet, Rfl: 1 .  Multiple Vitamin (MULTIVITAMIN WITH MINERALS) TABS tablet, Take 1 tablet by mouth daily., Disp: , Rfl:  .  Vitamin D, Ergocalciferol, (DRISDOL) 50000 units CAPS capsule, Take 50,000 Units by mouth every Friday. , Disp: , Rfl: 5  Review of Systems  HENT: Positive for congestion, ear pain (right ear), rhinorrhea and sneezing. Negative for sore throat.   Respiratory: Positive for cough (dry) and wheezing.   Gastrointestinal: Positive for nausea.  Musculoskeletal: Positive for myalgias.  Neurological: Positive for dizziness, weakness and headaches.    Social History   Tobacco Use  . Smoking status: Former Smoker    Last attempt to quit: 03/17/2004    Years since quitting: 13.9  . Smokeless tobacco: Never Used  Substance Use Topics  . Alcohol use: No   Objective:   BP 128/82 (BP Location: Left Arm, Patient Position: Sitting, Cuff Size: Large)   Pulse 90   Temp 98.8 F (37.1 C) (Oral)   Resp 20   Wt 269 lb (122 kg)   SpO2 96% Comment: room air  BMI 38.60 kg/m  Vitals:   02/24/18 1526  BP: 128/82  Pulse: 90  Resp: 20  Temp: 98.8 F (37.1 C)  TempSrc: Oral  SpO2: 96%  Weight: 269 lb (122 kg)     Physical Exam  General Appearance:    Alert, cooperative, no  distress  HENT:   neck without nodes, frontal sinus tender and nasal mucosa pale and congested  Eyes:    PERRL, conjunctiva/corneas clear, EOM's intact       Lungs:     Occasional expiratory wheeze, respirations unlabored  Heart:    Regular rate and rhythm  Neurologic:   Awake, alert, oriented x 3. No apparent focal neurological           defect.         Assessment & Plan:     1. Asthma with acute exacerbation, unspecified asthma severity, unspecified whether persistent  - azithromycin (ZITHROMAX) 250 MG tablet; 2 by mouth today, then 1 daily for 4 days  Dispense: 6 tablet; Refill: 0 - predniSONE (DELTASONE) 10 MG tablet; 6 tablets for 2 days, then 5 for 2  days, then 4 for 2 days, then 3 for 2 days, then 2 for 2 days, then 1 for 2 days.  Dispense: 42 tablet; Refill: 0  2. Sinusitis, unspecified chronicity, unspecified location  - azithromycin (ZITHROMAX) 250 MG tablet; 2 by mouth today, then 1 daily for 4 days  Dispense: 6 tablet; Refill: 0  Call if symptoms change or if not rapidly improving.       Lelon Huh, MD  [facility]

## 2018-03-27 ENCOUNTER — Emergency Department
Admission: EM | Admit: 2018-03-27 | Discharge: 2018-03-27 | Disposition: A | Discharge status: Home / Self Care | Payer: 59 | Attending: Emergency Medicine | Admitting: Emergency Medicine

## 2018-03-27 ENCOUNTER — Encounter: Payer: Self-pay | Admitting: Emergency Medicine

## 2018-03-27 ENCOUNTER — Emergency Department: Payer: 59

## 2018-03-27 ENCOUNTER — Other Ambulatory Visit: Payer: Self-pay

## 2018-03-27 DIAGNOSIS — M25472 Effusion, left ankle: Secondary | ICD-10-CM | POA: Diagnosis not present

## 2018-03-27 DIAGNOSIS — Z87891 Personal history of nicotine dependence: Secondary | ICD-10-CM | POA: Insufficient documentation

## 2018-03-27 DIAGNOSIS — J45909 Unspecified asthma, uncomplicated: Secondary | ICD-10-CM | POA: Diagnosis not present

## 2018-03-27 DIAGNOSIS — Z79899 Other long term (current) drug therapy: Secondary | ICD-10-CM | POA: Insufficient documentation

## 2018-03-27 DIAGNOSIS — M7989 Other specified soft tissue disorders: Secondary | ICD-10-CM | POA: Diagnosis not present

## 2018-03-27 DIAGNOSIS — M25572 Pain in left ankle and joints of left foot: Secondary | ICD-10-CM | POA: Insufficient documentation

## 2018-03-27 DIAGNOSIS — M79672 Pain in left foot: Secondary | ICD-10-CM | POA: Diagnosis not present

## 2018-03-27 LAB — CBC
HCT: 41.3 % (ref 36.0–46.0)
Hemoglobin: 13.3 g/dL (ref 12.0–15.0)
MCH: 31.3 pg (ref 26.0–34.0)
MCHC: 32.2 g/dL (ref 30.0–36.0)
MCV: 97.2 fL (ref 80.0–100.0)
Platelets: 248 10*3/uL (ref 150–400)
RBC: 4.25 MIL/uL (ref 3.87–5.11)
RDW: 12.8 % (ref 11.5–15.5)
WBC: 8.1 10*3/uL (ref 4.0–10.5)
nRBC: 0 % (ref 0.0–0.2)

## 2018-03-27 LAB — URIC ACID: Uric Acid, Serum: 3.1 mg/dL (ref 2.5–7.1)

## 2018-03-27 LAB — SEDIMENTATION RATE: Sed Rate: 6 mm/hr (ref 0–30)

## 2018-03-27 MED ORDER — MELOXICAM 15 MG PO TABS: 15.0000 mg | ORAL_TABLET | Freq: Every day | ORAL | 0 refills | Status: DC

## 2018-03-27 MED ORDER — HYDROCODONE-ACETAMINOPHEN 5-325 MG PO TABS
1.0000 | ORAL_TABLET | ORAL | Status: AC
  Administered 2018-03-27: 1 via ORAL
  Filled 2018-03-27: qty 1

## 2018-03-27 MED ORDER — HYDROCODONE-ACETAMINOPHEN 5-325 MG PO TABS: 1.0000 | ORAL_TABLET | ORAL | 0 refills | Status: DC | PRN

## 2018-03-27 NOTE — Discharge Instructions (Addendum)
Please rest ice and elevate left ankle.  Continue with ankle stabilizing brace.  Take meloxicam for 7 to 10 days with food as prescribed.  You may use Norco as needed for more severe pain.  Follow-up with primary care provider or orthopedics if no improvement 1 week.  Return to the ER for any increasing pain, swelling fevers or any urgent changes in health.

## 2018-03-27 NOTE — ED Provider Notes (Signed)
[place] EMERGENCY DEPARTMENT Provider Note   CSN: [id number] Arrival date & time: 03/27/18  1419     History   Chief Complaint Chief Complaint  Patient presents with  . Foot Pain    HPI Megan Jimenez is a 53 y.o. female.  Presents to the emergency department evaluation of left lateral ankle pain and swelling x2 weeks.  Patient states she has had intermittent episodes of fluctuating swelling pain warmth and redness throughout the lateral aspect of the left ankle.  At times her ankle has been 3 times larger than what actually is at this time.  Most of swelling is laterally.  At times her ankle will feel warm and appear red.  She denies any history of gout.  She has mild numbness on top of the foot.  She denies any trauma or injury.  She has been ambulatory.  She is been taken ibuprofen with no improvement.  Pain is moderate to severe.   HPI  Past Medical History:  Diagnosis Date  . Asthma   . Bipolar 1 disorder [facility])     Patient Active Problem List   Diagnosis Date Noted  . Asthma exacerbation 09/29/2017  . Allergic rhinitis 11/17/2014  . Anxiety 09/08/2014  . Acute asthma exacerbation 09/08/2014  . Affective bipolar disorder ([place]) 09/08/2014  . 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/08/2014  . Headache, migraine 09/08/2014  . Excess, menstruation 09/08/2014  . Extreme obesity 09/08/2014    Past Surgical History:  Procedure Laterality Date  . ANKLE FRACTURE SURGERY Right 1992  . CHOLECYSTECTOMY       OB History    Gravida  1   Para  1   Term      Preterm      AB      Living        SAB      TAB      Ectopic      Multiple  1   Live Births               Home Medications    Prior to Admission medications   Medication Sig Start Date End Date Taking? Authorizing Provider  albuterol (PROVENTIL HFA;VENTOLIN HFA) 108 (90 Base) MCG/ACT inhaler Inhale 2 puffs into the lungs every 6 (six) hours as needed for wheezing or shortness of  breath. 06/30/16   Mar Daring, PA-C  ALPRAZolam Duanne Moron) 1 MG tablet Take 1 mg by mouth 3 (three) times daily as needed for anxiety.    [provider]  ferrous sulfate 325 (65 FE) MG tablet Take 325 mg by mouth daily with breakfast.    [provider]  fluticasone (FLONASE) 50 MCG/ACT nasal spray Place 2 sprays into both nostrils daily. 09/30/17   Dustin Flock, MD  HYDROcodone-acetaminophen (NORCO) 5-325 MG tablet Take 1 tablet by mouth every 4 (four) hours as needed for moderate pain. 03/27/18   Duanne Guess, PA-C  ipratropium-albuterol (DUONEB) 0.5-2.5 (3) MG/3ML SOLN Take 3 mLs by nebulization every 6 (six) hours as needed. 09/30/17   Dustin Flock, MD  loratadine (CLARITIN) 10 MG tablet Take 10 mg by mouth daily as needed for allergies.    [provider]  magnesium oxide (MAG-OX) 400 MG tablet Take 400 mg by mouth every evening.    [provider]  meloxicam (MOBIC) 15 MG tablet Take 1 tablet (15 mg total) by mouth daily. 03/27/18   Duanne Guess, PA-C  mometasone-formoterol (DULERA) 200-5 MCG/ACT AERO Inhale 2 puffs  into the lungs 2 (two) times daily. 09/30/17   Dustin Flock, MD  montelukast (SINGULAIR) 10 MG tablet Take 1 tablet (10 mg total) by mouth every morning. 02/10/18   Mar Daring, PA-C  Multiple Vitamin (MULTIVITAMIN WITH MINERALS) TABS tablet Take 1 tablet by mouth daily.    [provider]  Vitamin D, Ergocalciferol, (DRISDOL) 50000 units CAPS capsule Take 50,000 Units by mouth every Friday.     [provider]    Family History Family History  Problem Relation Age of Onset  . COPD Mother   . Hyperlipidemia Mother   . Hyperlipidemia Father   . Leukemia Father   . COPD Father   . Depression Sister        ANXIETY  . Healthy Sister     Social History Social History   Tobacco Use  . Smoking status: Former Smoker    Last attempt to quit: 03/17/2004    Years since quitting: 14.0  . Smokeless  tobacco: Never Used  Substance Use Topics  . Alcohol use: No  . Drug use: No     Allergies   Patient has no known allergies.   Review of Systems Review of Systems  Constitutional: Negative for fever.  Gastrointestinal: Negative for nausea and vomiting.  Musculoskeletal: Positive for arthralgias, gait problem and joint swelling. Negative for myalgias and neck pain.  Skin: Negative for rash and wound.  Neurological: Positive for numbness. Negative for headaches.     Physical Exam Updated Vital Signs BP (!) 147/104   Pulse 86   Temp 98.6 F (37 C) (Oral)   Resp 18   Ht 5' 11" (1.803 m)   Wt 122.5 kg   SpO2 95%   BMI 37.66 kg/m   Physical Exam Constitutional:      Appearance: She is well-developed.  HENT:     Head: Normocephalic and atraumatic.  Eyes:     Conjunctiva/sclera: Conjunctivae normal.  Neck:     Musculoskeletal: Normal range of motion.  Cardiovascular:     Rate and Rhythm: Normal rate.  Pulmonary:     Effort: Pulmonary effort is normal. No respiratory distress.  Musculoskeletal: Normal range of motion.     Comments: Examination of left foot and ankle shows swelling along the lateral malleolus with no warmth or redness.  Sensation is intact.  2+ dorsalis pedis pulse.  No tenderness along the medial malleolus.  Ankle plantarflexion dorsiflexion is intact.  There is no skin breakdown, rashes or ulcerations noted.  Calf is soft nontender with no swelling warmth or redness.  Skin:    General: Skin is warm.     Findings: No rash.  Neurological:     Mental Status: She is alert and oriented to person, place, and time.  Psychiatric:        Behavior: Behavior normal.        Thought Content: Thought content normal.      ED Treatments / Results  Labs (all labs ordered are listed, but only abnormal results are displayed) Labs Reviewed  CBC  URIC ACID  SEDIMENTATION RATE    EKG None  Radiology Dg Ankle Complete Left  Result Date:  03/27/2018 CLINICAL DATA:  Left ankle pain for 1 week.  No known injury. EXAM: LEFT ANKLE COMPLETE - 3+ VIEW COMPARISON:  None. FINDINGS: There is no evidence of fracture, dislocation, or joint effusion. There is no evidence of arthropathy or other focal bone abnormality. Soft tissues are unremarkable. IMPRESSION: Negative. Electronically Signed  By: Earle Gell M.D.   On: 03/27/2018 16:51   Dg Foot Complete Left  Result Date: 03/27/2018 CLINICAL DATA:  Left foot pain and swelling for the past week. No injury. EXAM: LEFT FOOT - COMPLETE 3+ VIEW COMPARISON:  None. FINDINGS: No acute fracture or dislocation. Old healed fracture deformity of the distal fifth metatarsal. Mild second DIP joint osteoarthritis. Remaining joint spaces are preserved. Bone mineralization is normal. Small plantar enthesophyte. Soft tissues are unremarkable. IMPRESSION: 1.  No acute osseous abnormality. Electronically Signed   By: Titus Dubin M.D.   On: 03/27/2018 15:10    Procedures Procedures (including critical care time)  Medications Ordered in ED Medications  HYDROcodone-acetaminophen (NORCO/VICODIN) 5-325 MG per tablet 1 tablet (1 tablet Oral Given 03/27/18 1651)  HYDROcodone-acetaminophen (NORCO/VICODIN) 5-325 MG per tablet 1 tablet (1 tablet Oral Given 03/27/18 1657)     Initial Impression / Assessment and Plan / ED Course  I have reviewed the triage vital signs and the nursing notes.  Pertinent labs & imaging results that were available during my care of the patient were reviewed by me and considered in my medical decision making (see chart for details).     53 year old female with left ankle pain.  No known trauma or injury.  She has some swelling with intermittent episodes of increasing swelling as well as some redness and warmth.  CBC normal along with ESR and uric acid.  X-rays of the foot and ankle show no evidence of acute bony normality.  She is placed on anti-inflammatory medication and given pain  medication.  She will rest ice and elevate follow-up orthopedics if no improvement 1 week.  She understands signs symptoms return to ED for.  Final Clinical Impressions(s) / ED Diagnoses   Final diagnoses:  Acute left ankle pain  Swelling of left ankle joint    ED Discharge Orders         Ordered    meloxicam (MOBIC) 15 MG tablet  Daily     03/27/18 1824    HYDROcodone-acetaminophen (NORCO) 5-325 MG tablet  Every 4 hours PRN     03/27/18 1824           Duanne Guess, PA-C 03/27/18 1826    Carrie Mew, MD 03/28/18 2357

## 2018-03-27 NOTE — ED Notes (Signed)
Pt states she "just wants to know if her foot is broken and if not I will follow up with my doctor." Pt denies injury to L foot, onset of pain x 1 week ago, pain increasing.

## 2018-03-27 NOTE — ED Triage Notes (Signed)
L foot pain x 1 1/2 week. Denies injury.

## 2018-03-28 DIAGNOSIS — Z23 Encounter for immunization: Secondary | ICD-10-CM | POA: Diagnosis not present

## 2018-04-21 ENCOUNTER — Encounter: Payer: Self-pay | Admitting: Physician Assistant

## 2018-04-26 ENCOUNTER — Encounter: Payer: Self-pay | Admitting: Physician Assistant

## 2018-04-26 ENCOUNTER — Other Ambulatory Visit (HOSPITAL_COMMUNITY)
Admission: RE | Admit: 2018-04-26 | Discharge: 2018-04-26 | Disposition: A | Discharge status: Home / Self Care | Payer: 59 | Source: Ambulatory Visit | Attending: Physician Assistant | Admitting: Physician Assistant

## 2018-04-26 ENCOUNTER — Ambulatory Visit (INDEPENDENT_AMBULATORY_CARE_PROVIDER_SITE_OTHER): Payer: 59 | Admitting: Physician Assistant

## 2018-04-26 VITALS — BP 150/89 | HR 82 | Temp 98.4°F | Resp 16 | Ht 69.0 in | Wt 270.4 lb

## 2018-04-26 DIAGNOSIS — G8929 Other chronic pain: Secondary | ICD-10-CM

## 2018-04-26 DIAGNOSIS — Z124 Encounter for screening for malignant neoplasm of cervix: Secondary | ICD-10-CM | POA: Insufficient documentation

## 2018-04-26 DIAGNOSIS — J4520 Mild intermittent asthma, uncomplicated: Secondary | ICD-10-CM

## 2018-04-26 DIAGNOSIS — Z1211 Encounter for screening for malignant neoplasm of colon: Secondary | ICD-10-CM | POA: Diagnosis not present

## 2018-04-26 DIAGNOSIS — Z1231 Encounter for screening mammogram for malignant neoplasm of breast: Secondary | ICD-10-CM | POA: Diagnosis not present

## 2018-04-26 DIAGNOSIS — Z0000 Encounter for general adult medical examination without abnormal findings: Secondary | ICD-10-CM

## 2018-04-26 DIAGNOSIS — F3176 Bipolar disorder, in full remission, most recent episode depressed: Secondary | ICD-10-CM

## 2018-04-26 DIAGNOSIS — Z6839 Body mass index (BMI) 39.0-39.9, adult: Secondary | ICD-10-CM

## 2018-04-26 DIAGNOSIS — M25572 Pain in left ankle and joints of left foot: Secondary | ICD-10-CM

## 2018-04-26 MED ORDER — MELOXICAM 15 MG PO TABS: 15.0000 mg | ORAL_TABLET | Freq: Every day | ORAL | 0 refills | Status: DC

## 2018-04-26 MED ORDER — FLUTICASONE PROPIONATE HFA 110 MCG/ACT IN AERO: 2.0000 | INHALATION_SPRAY | Freq: Every day | RESPIRATORY_TRACT | 12 refills | Status: DC

## 2018-04-26 MED ORDER — ALBUTEROL SULFATE HFA 108 (90 BASE) MCG/ACT IN AERS
2.0000 | INHALATION_SPRAY | Freq: Four times a day (QID) | RESPIRATORY_TRACT | 5 refills | Status: DC | PRN
Start: 2018-04-26 — End: 2020-06-13

## 2018-04-26 MED ORDER — MELOXICAM 15 MG PO TABS: 15.0000 mg | ORAL_TABLET | Freq: Every day | ORAL | 5 refills | Status: DC

## 2018-04-26 NOTE — Progress Notes (Signed)
Patient: Megan Jimenez, Female    DOB: [birth year], 53 y.o.   MRN: [id number] Visit Date: 04/26/2018  Today's Provider: Mar Daring, PA-C   Chief Complaint  Patient presents with  . Annual Exam   Subjective:     Annual physical exam Megan Jimenez is a 53 y.o. female who presents today for health maintenance and complete physical. She feels fairly well. She reports exercising none . She reports she is sleeping fairly well sometimes she sleep a lot and sometimes she doesn't sleep at all. ----------------------------------------------------------------- Patient reports strong family history of ovarian cancer.  Review of Systems  Constitutional: Negative.   Eyes: Negative.   Respiratory: Positive for shortness of breath and wheezing.   Cardiovascular: Positive for leg swelling.  Gastrointestinal: Negative.   Endocrine: Negative.   Genitourinary: Negative.   Musculoskeletal: Negative.   Skin: Negative.   Allergic/Immunologic: Negative.   Neurological: Negative.   Hematological: Bruises/bleeds easily.  Psychiatric/Behavioral: The patient is nervous/anxious.     Social History      She  reports that she quit smoking about 14 years ago. She has never used smokeless tobacco. She reports that she does not drink alcohol or use drugs.       Social History   Socioeconomic History  . Marital status: Married    Spouse name: Not on file  . Number of children: 2  . Years of education: College  . Highest education level: Not on file  Occupational History    Comment: Full-time  Social Needs  . Financial resource strain: Not hard at all  . Food insecurity:    Worry: Never true    Inability: Never true  . Transportation needs:    Medical: No    Non-medical: No  Tobacco Use  . Smoking status: Former Smoker    Last attempt to quit: 03/17/2004    Years since quitting: 14.1  . Smokeless tobacco: Never Used  Substance and Sexual Activity  . Alcohol use: No  .  Drug use: No  . Sexual activity: Not Currently  Lifestyle  . Physical activity:    Days per week: 2 days    Minutes per session: 20 min  . Stress: Only a little  Relationships  . Social connections:    Talks on phone: Three times a week    Gets together: Twice a week    Attends religious service: 1 to 4 times per year    Active member of club or organization: No    Attends meetings of clubs or organizations: Never    Relationship status: Married  Other Topics Concern  . Not on file  Social History Narrative   Lives at home with husband    Past Medical History:  Diagnosis Date  . Asthma   . Bipolar 1 disorder [facility])      Patient Active Problem List   Diagnosis Date Noted  . Asthma exacerbation 09/29/2017  . Allergic rhinitis 11/17/2014  . Anxiety 09/08/2014  . Acute asthma exacerbation 09/08/2014  . Affective bipolar disorder ([place]) 09/08/2014  . 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/08/2014  . Headache, migraine 09/08/2014  . Excess, menstruation 09/08/2014  . Extreme obesity 09/08/2014    Past Surgical History:  Procedure Laterality Date  . ANKLE FRACTURE SURGERY Right 1992  . CHOLECYSTECTOMY      Family History        Family Status  Relation Name Status  . Mother  Alive  . Father  Deceased at  age 85       LEUKEMIA  . Sister  Alive  . Sister  Alive        Her family history includes COPD in her father and mother; Cancer in her mother; Depression in her sister; Healthy in her sister; Hyperlipidemia in her father and mother; Leukemia in her father.      No Known Allergies   Current Outpatient Medications:  .  albuterol (PROVENTIL HFA;VENTOLIN HFA) 108 (90 Base) MCG/ACT inhaler, Inhale 2 puffs into the lungs every 6 (six) hours as needed for wheezing or shortness of breath., Disp: 1 Inhaler, Rfl: 5 .  ALPRAZolam (XANAX) 1 MG tablet, Take 1 mg by mouth 3 (three) times daily as needed for anxiety., Disp: , Rfl:  .  ferrous sulfate 325 (65 FE) MG tablet, Take 325 mg by  mouth daily with breakfast., Disp: , Rfl:  .  fluticasone (FLONASE) 50 MCG/ACT nasal spray, Place 2 sprays into both nostrils daily., Disp: 16 g, Rfl: 2 .  ipratropium-albuterol (DUONEB) 0.5-2.5 (3) MG/3ML SOLN, Take 3 mLs by nebulization every 6 (six) hours as needed., Disp: 360 mL, Rfl: 2 .  loratadine (CLARITIN) 10 MG tablet, Take 10 mg by mouth daily as needed for allergies., Disp: , Rfl:  .  magnesium oxide (MAG-OX) 400 MG tablet, Take 400 mg by mouth every evening., Disp: , Rfl:  .  mometasone-formoterol (DULERA) 200-5 MCG/ACT AERO, Inhale 2 puffs into the lungs 2 (two) times daily., Disp: 1 Inhaler, Rfl: 2 .  montelukast (SINGULAIR) 10 MG tablet, Take 1 tablet (10 mg total) by mouth every morning., Disp: 90 tablet, Rfl: 1 .  Multiple Vitamin (MULTIVITAMIN WITH MINERALS) TABS tablet, Take 1 tablet by mouth daily., Disp: , Rfl:  .  Vitamin D, Ergocalciferol, (DRISDOL) 50000 units CAPS capsule, Take 50,000 Units by mouth every Friday. , Disp: , Rfl: 5 .  HYDROcodone-acetaminophen (NORCO) 5-325 MG tablet, Take 1 tablet by mouth every 4 (four) hours as needed for moderate pain. (Patient not taking: Reported on 04/26/2018), Disp: 20 tablet, Rfl: 0 .  meloxicam (MOBIC) 15 MG tablet, Take 1 tablet (15 mg total) by mouth daily. (Patient not taking: Reported on 04/26/2018), Disp: 30 tablet, Rfl: 0   Patient Care Team: Mar Daring, PA-C as PCP - General (Family Medicine)    Objective:    Vitals: BP (!) 150/89 (BP Location: Left Arm, Patient Position: Sitting, Cuff Size: Large)   Pulse 82   Temp 98.4 F (36.9 C) (Oral)   Resp 16   Ht 5\' 9"  (1.753 m)   Wt 270 lb 6.4 oz (122.7 kg)   SpO2 98%   BMI 39.93 kg/m    Vitals:   04/26/18 1630  BP: (!) 150/89  Pulse: 82  Resp: 16  Temp: 98.4 F (36.9 C)  TempSrc: Oral  SpO2: 98%  Weight: 270 lb 6.4 oz (122.7 kg)  Height: 5\' 9"  (1.753 m)     Physical Exam Vitals signs reviewed.  Constitutional:      General: She is not in acute  distress.    Appearance: Normal appearance. She is well-developed. She is obese. She is not diaphoretic.  HENT:     Head: Normocephalic and atraumatic.     Right Ear: Hearing, tympanic membrane, ear canal and external ear normal.     Left Ear: Hearing, tympanic membrane, ear canal and external ear normal.     Nose: Nose normal.     Mouth/Throat:     Mouth: Mucous membranes  are moist.     Pharynx: Oropharynx is clear. Uvula midline. No oropharyngeal exudate.  Eyes:     General: No scleral icterus.       Right eye: No discharge.        Left eye: No discharge.     Extraocular Movements: Extraocular movements intact.     Conjunctiva/sclera: Conjunctivae normal.     Pupils: Pupils are equal, round, and reactive to light.  Neck:     Musculoskeletal: Normal range of motion and neck supple.     Thyroid: No thyromegaly.     Vascular: No carotid bruit or JVD.     Trachea: No tracheal deviation.  Cardiovascular:     Rate and Rhythm: Normal rate and regular rhythm.     Pulses: Normal pulses.     Heart sounds: Normal heart sounds. No murmur. No friction rub. No gallop.   Pulmonary:     Effort: Pulmonary effort is normal. No respiratory distress.     Breath sounds: Normal breath sounds. No wheezing or rales.  Chest:     Chest wall: No tenderness.     Breasts: Breasts are symmetrical.        Right: No inverted nipple, mass, nipple discharge, skin change or tenderness.        Left: No inverted nipple, mass, nipple discharge, skin change or tenderness.  Abdominal:     General: Bowel sounds are normal. There is no distension.     Palpations: Abdomen is soft. There is no mass.     Tenderness: There is no abdominal tenderness. There is no guarding or rebound.     Hernia: There is no hernia in the right inguinal area or left inguinal area.  Genitourinary:    Exam position: Supine.     Labia:        Right: No rash, tenderness, lesion or injury.        Left: No rash, tenderness, lesion or injury.       Vagina: Normal. No signs of injury. No vaginal discharge, erythema, tenderness or bleeding.     Cervix: No cervical motion tenderness, discharge or friability.     Adnexa:        Right: No mass, tenderness or fullness.         Left: No mass, tenderness or fullness.       Rectum: Normal.  Musculoskeletal: Normal range of motion.        General: No tenderness.  Lymphadenopathy:     Cervical: No cervical adenopathy.  Skin:    General: Skin is warm and dry.     Findings: No rash.  Neurological:     Mental Status: She is alert and oriented to person, place, and time.     Cranial Nerves: No cranial nerve deficit.     Coordination: Coordination normal.     Deep Tendon Reflexes: Reflexes are normal and symmetric.  Psychiatric:        Mood and Affect: Mood normal.        Behavior: Behavior normal.        Thought Content: Thought content normal.        Judgment: Judgment normal.      Depression Screen PHQ 2/9 Scores 04/26/2018  PHQ - 2 Score 2  PHQ- 9 Score 7       Assessment & Plan:     Routine Health Maintenance and Physical Exam  Exercise Activities and Dietary recommendations Goals   None     Immunization History  Administered Date(s) Administered  . Pneumococcal Polysaccharide-23 10/25/2015  . Td 05/30/1997  . Tdap 10/25/2015    Health Maintenance  Topic Date Due  . HIV Screening  09/27/1979  . MAMMOGRAM  09/27/2014  . COLONOSCOPY  09/27/2014  . PAP SMEAR-Modifier  10/25/2018  . TETANUS/TDAP  10/24/2025  . INFLUENZA VACCINE  Completed     Discussed health benefits of physical activity, and encouraged her to engage in regular exercise appropriate for her age and condition.    1. Encounter for annual physical exam Normal physical exam today. Will check labs as below and f/u pending lab results. If labs are stable and WNL she will not need to have these rechecked for one year at her next annual physical exam. She is to call the office in the meantime if  she has any acute issue, questions or concerns.  2. Encounter for screening for cervical cancer  Pap collected today. Will send as below and f/u pending results. - Cytology - PAP  3. Encounter for screening mammogram for breast cancer Breast exam today was normal. There is no family history of breast cancer. She does perform regular self breast exams. Mammogram was ordered as below. Information for [facility] clinic was given to patient so she may schedule her mammogram at her convenience. - MM 3D SCREEN BREAST BILATERAL; Future  4. Screening for colon cancer Due for colonoscopy, no previous. Referral placed.  - Ambulatory referral to Gastroenterology  5. Bipolar disorder, in full remission, most recent episode depressed ([place]) Stable. Currently not on treatment.  - CBC with Differential/Platelet - Comprehensive metabolic panel - Hemoglobin A1c - TSH - Lipid panel  6. Class 2 severe obesity due to excess calories with serious comorbidity and body mass index (BMI) of 39.0 to 39.9 in adult [facility]) Counseled patient on healthy lifestyle modifications including dieting and exercise.  - CBC with Differential/Platelet - Comprehensive metabolic panel - Hemoglobin A1c - TSH - Lipid panel  7. Mild intermittent asthma without acute exacerbation Stable. Diagnosis pulled for medication refill. Continue current medical treatment plan. - fluticasone (FLOVENT HFA) 110 MCG/ACT inhaler; Inhale 2 puffs into the lungs daily.  Dispense: 1 Inhaler; Refill: 12 - albuterol (PROVENTIL HFA;VENTOLIN HFA) 108 (90 Base) MCG/ACT inhaler; Inhale 2 puffs into the lungs every 6 (six) hours as needed for wheezing or shortness of breath.  Dispense: 1 Inhaler; Refill: 5  8. Chronic left ankle pain Stable. Diagnosis pulled for medication refill. Continue current medical treatment plan. - meloxicam (MOBIC) 15 MG tablet; Take 1 tablet (15 mg total) by mouth daily.  Dispense: 30 tablet; Refill:  5  --------------------------------------------------------------------    Mar Daring, PA-C  [facility]

## 2018-04-26 NOTE — Patient Instructions (Signed)

## 2018-04-28 DIAGNOSIS — Z6839 Body mass index (BMI) 39.0-39.9, adult: Secondary | ICD-10-CM | POA: Diagnosis not present

## 2018-04-29 LAB — COMPREHENSIVE METABOLIC PANEL
ALT: 22 IU/L (ref 0–32)
AST: 19 IU/L (ref 0–40)
Albumin/Globulin Ratio: 2.3 — ABNORMAL HIGH (ref 1.2–2.2)
Albumin: 4.3 g/dL (ref 3.8–4.9)
Alkaline Phosphatase: 66 IU/L (ref 39–117)
BUN/Creatinine Ratio: 24 — ABNORMAL HIGH (ref 9–23)
BUN: 16 mg/dL (ref 6–24)
Bilirubin Total: 0.3 mg/dL (ref 0.0–1.2)
CO2: 18 mmol/L — ABNORMAL LOW (ref 20–29)
Calcium: 9.3 mg/dL (ref 8.7–10.2)
Chloride: 102 mmol/L (ref 96–106)
Creatinine, Ser: 0.67 mg/dL (ref 0.57–1.00)
GFR calc Af Amer: 116 mL/min/{1.73_m2} (ref >59)
GFR calc non Af Amer: 101 mL/min/{1.73_m2} (ref >59)
Globulin, Total: 1.9 g/dL (ref 1.5–4.5)
Glucose: 90 mg/dL (ref 65–99)
Potassium: 4.6 mmol/L (ref 3.5–5.2)
Sodium: 142 mmol/L (ref 134–144)
Total Protein: 6.2 g/dL (ref 6.0–8.5)

## 2018-04-29 LAB — CBC WITH DIFFERENTIAL/PLATELET
Basophils Absolute: 0.1 10*3/uL (ref 0.0–0.2)
Basos: 1 %
EOS (ABSOLUTE): 0.2 10*3/uL (ref 0.0–0.4)
Eos: 2 %
Hematocrit: 40.8 % (ref 34.0–46.6)
Hemoglobin: 14.1 g/dL (ref 11.1–15.9)
Immature Grans (Abs): 0 10*3/uL (ref 0.0–0.1)
Immature Granulocytes: 0 %
Lymphocytes Absolute: 2.1 10*3/uL (ref 0.7–3.1)
Lymphs: 25 %
MCH: 32.1 pg (ref 26.6–33.0)
MCHC: 34.6 g/dL (ref 31.5–35.7)
MCV: 93 fL (ref 79–97)
Monocytes Absolute: 0.6 10*3/uL (ref 0.1–0.9)
Monocytes: 7 %
Neutrophils Absolute: 5.6 10*3/uL (ref 1.4–7.0)
Neutrophils: 65 %
Platelets: 269 10*3/uL (ref 150–450)
RBC: 4.39 x10E6/uL (ref 3.77–5.28)
RDW: 12.9 % (ref 11.7–15.4)
WBC: 8.5 10*3/uL (ref 3.4–10.8)

## 2018-04-29 LAB — LIPID PANEL
Chol/HDL Ratio: 3.1 ratio (ref 0.0–4.4)
Cholesterol, Total: 230 mg/dL — ABNORMAL HIGH (ref 100–199)
HDL: 74 mg/dL (ref >39)
LDL Calculated: 119 mg/dL — ABNORMAL HIGH (ref 0–99)
Triglycerides: 185 mg/dL — ABNORMAL HIGH (ref 0–149)
VLDL Cholesterol Cal: 37 mg/dL (ref 5–40)

## 2018-04-29 LAB — TSH: TSH: 1.54 u[IU]/mL (ref 0.450–4.500)

## 2018-04-29 LAB — HEMOGLOBIN A1C
Est. average glucose Bld gHb Est-mCnc: 108 mg/dL
Hgb A1c MFr Bld: 5.4 % (ref 4.8–5.6)

## 2018-04-29 LAB — CYTOLOGY - PAP
Diagnosis: NEGATIVE
HPV: NOT DETECTED

## 2018-04-30 ENCOUNTER — Telehealth: Payer: Self-pay

## 2018-04-30 DIAGNOSIS — M7989 Other specified soft tissue disorders: Secondary | ICD-10-CM

## 2018-04-30 MED ORDER — FUROSEMIDE 20 MG PO TABS: 20.0000 mg | ORAL_TABLET | Freq: Every day | ORAL | 3 refills | Status: DC

## 2018-04-30 MED ORDER — POTASSIUM CHLORIDE CRYS ER 20 MEQ PO TBCR: 20.0000 meq | EXTENDED_RELEASE_TABLET | Freq: Every day | ORAL | 3 refills | Status: DC

## 2018-04-30 NOTE — Telephone Encounter (Signed)
Patient advised as directed below. Per patient reports that you told her that if her kidneys function came back normal that you were going to send in the fluid pill? She uses  [facility] [place].

## 2018-04-30 NOTE — Telephone Encounter (Signed)
Yes I will. Sent in for her

## 2018-04-30 NOTE — Telephone Encounter (Signed)
-----   Message from Mar Daring, [place] sent at 04/29/2018  4:47 PM EST ----- Blood count is normal. Kidney and liver function is normal. Sugar normal. Thyroid normal. Cholesterol is up slightly from last year. Current risk of having a cardiovascular event over the next 10 years is moderate at 4.1%. However, still at this level it is not necessary to start cholesterol lowering medications. Just continue to work on healthy lifestyle modifications with dieting and exercise. Pap is normal and negative for HPV. No need to recheck for 3-5 years.

## 2018-05-18 ENCOUNTER — Encounter: Payer: Self-pay | Admitting: *Deleted

## 2018-06-17 DIAGNOSIS — J988 Other specified respiratory disorders: Secondary | ICD-10-CM | POA: Diagnosis not present

## 2018-06-17 DIAGNOSIS — R509 Fever, unspecified: Secondary | ICD-10-CM | POA: Diagnosis not present

## 2018-06-17 DIAGNOSIS — R0602 Shortness of breath: Secondary | ICD-10-CM | POA: Diagnosis not present

## 2018-07-26 ENCOUNTER — Telehealth: Payer: Self-pay

## 2018-07-26 NOTE — Telephone Encounter (Signed)
Called patient to do a Depression screening. no answer/unable to LM.

## 2018-08-16 ENCOUNTER — Other Ambulatory Visit: Payer: Self-pay | Admitting: Physician Assistant

## 2018-08-16 DIAGNOSIS — J301 Allergic rhinitis due to pollen: Secondary | ICD-10-CM

## 2018-09-28 ENCOUNTER — Telehealth: Payer: Self-pay | Admitting: Physician Assistant

## 2018-09-28 ENCOUNTER — Emergency Department
Admission: EM | Admit: 2018-09-28 | Discharge: 2018-09-28 | Disposition: A | Discharge status: Home / Self Care | Payer: 59 | Attending: Emergency Medicine | Admitting: Emergency Medicine

## 2018-09-28 ENCOUNTER — Other Ambulatory Visit: Payer: Self-pay

## 2018-09-28 ENCOUNTER — Telehealth: Payer: Self-pay

## 2018-09-28 ENCOUNTER — Emergency Department: Payer: 59

## 2018-09-28 ENCOUNTER — Encounter: Payer: Self-pay | Admitting: *Deleted

## 2018-09-28 DIAGNOSIS — Z79899 Other long term (current) drug therapy: Secondary | ICD-10-CM | POA: Insufficient documentation

## 2018-09-28 DIAGNOSIS — J45909 Unspecified asthma, uncomplicated: Secondary | ICD-10-CM | POA: Insufficient documentation

## 2018-09-28 DIAGNOSIS — J209 Acute bronchitis, unspecified: Secondary | ICD-10-CM

## 2018-09-28 DIAGNOSIS — R0602 Shortness of breath: Secondary | ICD-10-CM | POA: Diagnosis present

## 2018-09-28 DIAGNOSIS — Z20828 Contact with and (suspected) exposure to other viral communicable diseases: Secondary | ICD-10-CM | POA: Insufficient documentation

## 2018-09-28 DIAGNOSIS — Z87891 Personal history of nicotine dependence: Secondary | ICD-10-CM | POA: Diagnosis not present

## 2018-09-28 LAB — CBC WITH DIFFERENTIAL/PLATELET
Abs Immature Granulocytes: 0.02 10*3/uL (ref 0.00–0.07)
Basophils Absolute: 0.1 10*3/uL (ref 0.0–0.1)
Basophils Relative: 1 %
Eosinophils Absolute: 0.2 10*3/uL (ref 0.0–0.5)
Eosinophils Relative: 2 %
HCT: 44.2 % (ref 36.0–46.0)
Hemoglobin: 14.3 g/dL (ref 12.0–15.0)
Immature Granulocytes: 0 %
Lymphocytes Relative: 19 %
Lymphs Abs: 1.5 10*3/uL (ref 0.7–4.0)
MCH: 31.5 pg (ref 26.0–34.0)
MCHC: 32.4 g/dL (ref 30.0–36.0)
MCV: 97.4 fL (ref 80.0–100.0)
Monocytes Absolute: 0.6 10*3/uL (ref 0.1–1.0)
Monocytes Relative: 8 %
Neutro Abs: 5.3 10*3/uL (ref 1.7–7.7)
Neutrophils Relative %: 70 %
Platelets: 301 10*3/uL (ref 150–400)
RBC: 4.54 MIL/uL (ref 3.87–5.11)
RDW: 12.3 % (ref 11.5–15.5)
WBC: 7.7 10*3/uL (ref 4.0–10.5)
nRBC: 0 % (ref 0.0–0.2)

## 2018-09-28 LAB — TROPONIN I (HIGH SENSITIVITY)
Troponin I (High Sensitivity): 3 ng/L (ref <18)
Troponin I (High Sensitivity): 4 ng/L (ref <18)

## 2018-09-28 LAB — COMPREHENSIVE METABOLIC PANEL
ALT: 19 U/L (ref 0–44)
AST: 17 U/L (ref 15–41)
Albumin: 3.8 g/dL (ref 3.5–5.0)
Alkaline Phosphatase: 67 U/L (ref 38–126)
Anion gap: 8 (ref 5–15)
BUN: 15 mg/dL (ref 6–20)
CO2: 27 mmol/L (ref 22–32)
Calcium: 9 mg/dL (ref 8.9–10.3)
Chloride: 106 mmol/L (ref 98–111)
Creatinine, Ser: 0.48 mg/dL (ref 0.44–1.00)
GFR calc Af Amer: >60 mL/min (ref >60)
GFR calc non Af Amer: >60 mL/min (ref >60)
Glucose, Bld: 111 mg/dL — ABNORMAL HIGH (ref 70–99)
Potassium: 4.4 mmol/L (ref 3.5–5.1)
Sodium: 141 mmol/L (ref 135–145)
Total Bilirubin: 0.5 mg/dL (ref 0.3–1.2)
Total Protein: 6.5 g/dL (ref 6.5–8.1)

## 2018-09-28 LAB — URINALYSIS, ROUTINE W REFLEX MICROSCOPIC
Bilirubin Urine: NEGATIVE
Glucose, UA: NEGATIVE mg/dL
Hgb urine dipstick: NEGATIVE
Ketones, ur: 5 mg/dL — AB
Leukocytes,Ua: NEGATIVE
Nitrite: NEGATIVE
Protein, ur: NEGATIVE mg/dL
Specific Gravity, Urine: 1.01 (ref 1.005–1.030)
pH: 7 (ref 5.0–8.0)

## 2018-09-28 LAB — SARS CORONAVIRUS 2 BY RT PCR (HOSPITAL ORDER, PERFORMED IN ~~LOC~~ HOSPITAL LAB): SARS Coronavirus 2: NEGATIVE

## 2018-09-28 LAB — BRAIN NATRIURETIC PEPTIDE: B Natriuretic Peptide: 57 pg/mL (ref 0.0–100.0)

## 2018-09-28 MED ORDER — PREDNISONE 20 MG PO TABS: 40.0000 mg | ORAL_TABLET | Freq: Every day | ORAL | 0 refills | Status: AC

## 2018-09-28 MED ORDER — DIPHENHYDRAMINE HCL 50 MG/ML IJ SOLN
12.5000 mg | Freq: Once | INTRAMUSCULAR | Status: AC
  Administered 2018-09-28: 15:00:00 12.5 mg via INTRAVENOUS
  Filled 2018-09-28: qty 1

## 2018-09-28 MED ORDER — AZITHROMYCIN 250 MG PO TABS: ORAL_TABLET | ORAL | 0 refills | Status: AC

## 2018-09-28 MED ORDER — PROCHLORPERAZINE EDISYLATE 10 MG/2ML IJ SOLN
10.0000 mg | Freq: Once | INTRAMUSCULAR | Status: AC
  Administered 2018-09-28: 15:00:00 10 mg via INTRAVENOUS
  Filled 2018-09-28: qty 2

## 2018-09-28 MED ORDER — IPRATROPIUM-ALBUTEROL 0.5-2.5 (3) MG/3ML IN SOLN
3.0000 mL | Freq: Once | RESPIRATORY_TRACT | Status: AC
  Administered 2018-09-28: 3 mL via RESPIRATORY_TRACT
  Filled 2018-09-28: qty 3

## 2018-09-28 MED ORDER — METHYLPREDNISOLONE SODIUM SUCC 125 MG IJ SOLR
125.0000 mg | Freq: Once | INTRAMUSCULAR | Status: AC
  Administered 2018-09-28: 125 mg via INTRAVENOUS
  Filled 2018-09-28: qty 2

## 2018-09-28 MED ORDER — SODIUM CHLORIDE 0.9 % IV BOLUS: 1000.0000 mL | Freq: Once | INTRAVENOUS | Status: DC

## 2018-09-28 MED ORDER — ACETAMINOPHEN 325 MG PO TABS
650.0000 mg | ORAL_TABLET | Freq: Once | ORAL | Status: AC
  Administered 2018-09-28: 650 mg via ORAL
  Filled 2018-09-28: qty 2

## 2018-09-28 MED ORDER — AZITHROMYCIN 250 MG PO TABS: ORAL_TABLET | ORAL | 0 refills | Status: DC

## 2018-09-28 MED ORDER — KETOROLAC TROMETHAMINE 30 MG/ML IJ SOLN
15.0000 mg | Freq: Once | INTRAMUSCULAR | Status: AC
  Administered 2018-09-28: 14:00:00 15 mg via INTRAVENOUS
  Filled 2018-09-28: qty 1

## 2018-09-28 NOTE — ED Triage Notes (Signed)
Pt to ED reporting increased SOB since the weekend with cough and fever of 101.7. Generalized body aches, nausea and headache started yesterday. Pt reporting she was lightheaded and diaphoretic last night.   Wheezing noted in triage but pt is able to talk without increased distress. Pt reports dyspnea upon exertion and was SOB after walking from parking lot.  Ibuprofen taken 2 hours ago. Pt is afebrile upon arrival to ED.

## 2018-09-28 NOTE — Telephone Encounter (Signed)
Noted  

## 2018-09-28 NOTE — ED Provider Notes (Signed)
[facility] Emergency Department Provider Note  ____________________________________________   First MD Initiated Contact with Patient 09/28/18 1315     (approximate)  I have reviewed the triage vital signs and the nursing notes.   HISTORY  Chief Complaint Shortness of Breath, Cough, and Headache    HPI Megan Jimenez is a 53 y.o. female with asthma bipolar who presents for multiple symptoms.  Patient said for the past few days she has had shortness of breath.  She says say has asthma that she normally takes her albuterol however does not relieve her symptoms recently.  She also has generalized body aches, headache, back aches, just overall just not feeling well.  She says her shortness of breath is moderate in nature, not better with albuterol, nothing makes it worse.  She does have some associated fevers.  Denies prior history of blood clots.  No neck stiffness.        Past Medical History:  Diagnosis Date   Asthma    Bipolar 1 disorder [facility])     Patient Active Problem List   Diagnosis Date Noted   Asthma exacerbation 09/29/2017   Allergic rhinitis 11/17/2014   Anxiety 09/08/2014   Acute asthma exacerbation 09/08/2014   Affective bipolar disorder ([place]) 09/08/2014   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/08/2014   Headache, migraine 09/08/2014   Excess, menstruation 09/08/2014   Class 2 severe obesity due to excess calories with serious comorbidity and body mass index (BMI) of 39.0 to 39.9 in adult [facility]) 09/08/2014    Past Surgical History:  Procedure Laterality Date   ANKLE FRACTURE SURGERY Right 1992   CHOLECYSTECTOMY      Prior to Admission medications   Medication Sig Start Date End Date Taking? Authorizing Provider  albuterol (PROVENTIL HFA;VENTOLIN HFA) 108 (90 Base) MCG/ACT inhaler Inhale 2 puffs into the lungs every 6 (six) hours as needed for wheezing or shortness of breath. 04/26/18   Mar Daring, PA-C  ALPRAZolam Duanne Moron)  1 MG tablet Take 1 mg by mouth 3 (three) times daily as needed for anxiety.    [provider]  ferrous sulfate 325 (65 FE) MG tablet Take 325 mg by mouth daily with breakfast.    [provider]  fluticasone (FLONASE) 50 MCG/ACT nasal spray Place 2 sprays into both nostrils daily. 09/30/17   Dustin Flock, MD  fluticasone (FLOVENT HFA) 110 MCG/ACT inhaler Inhale 2 puffs into the lungs daily. 04/26/18   Mar Daring, PA-C  furosemide (LASIX) 20 MG tablet Take 1 tablet (20 mg total) by mouth daily. 04/30/18   Mar Daring, PA-C  ipratropium-albuterol (DUONEB) 0.5-2.5 (3) MG/3ML SOLN Take 3 mLs by nebulization every 6 (six) hours as needed. 09/30/17   Dustin Flock, MD  loratadine (CLARITIN) 10 MG tablet Take 10 mg by mouth daily as needed for allergies.    [provider]  magnesium oxide (MAG-OX) 400 MG tablet Take 400 mg by mouth every evening.    [provider]  meloxicam (MOBIC) 15 MG tablet Take 1 tablet (15 mg total) by mouth daily. 04/26/18   Mar Daring, PA-C  mometasone-formoterol (DULERA) 200-5 MCG/ACT AERO Inhale 2 puffs into the lungs 2 (two) times daily. 09/30/17   Dustin Flock, MD  montelukast (SINGULAIR) 10 MG tablet TAKE 1 TABLET BY MOUTH ONCE DAILY IN THE MORNING 08/16/18   Mar Daring, PA-C  Multiple Vitamin (MULTIVITAMIN WITH MINERALS) TABS tablet Take 1 tablet by mouth daily.    [provider]  potassium chloride SA (K-DUR,KLOR-CON) 20 MEQ tablet Take 1 tablet (20 mEq total) by mouth daily. 04/30/18   Mar Daring, PA-C  Vitamin D, Ergocalciferol, (DRISDOL) 50000 units CAPS capsule Take 50,000 Units by mouth every Friday.     [provider]    Allergies Patient has no known allergies.  Family History  Problem Relation Age of Onset   COPD Mother    Hyperlipidemia Mother    Cancer Mother        Lung cancer   Hyperlipidemia Father    Leukemia Father    COPD Father     Depression Sister        ANXIETY   Healthy Sister     Social History Social History   Tobacco Use   Smoking status: Former Smoker    Quit date: 03/17/2004    Years since quitting: 14.5   Smokeless tobacco: Never Used  Substance Use Topics   Alcohol use: No   Drug use: No      Review of Systems Constitutional: Positive for fevers Eyes: No visual changes. ENT: No sore throat. Cardiovascular: No chest pain Respiratory: Positive for SOB Gastrointestinal: No abdominal pain.  No nausea, no vomiting.  No diarrhea.  No constipation. Genitourinary: Negative for dysuria. Musculoskeletal: Negative for back pain. Skin: Negative for rash. Neurological: Negative for headaches, focal weakness or numbness. All other ROS negative ____________________________________________   PHYSICAL EXAM:  VITAL SIGNS: ED Triage Vitals  Enc Vitals Group     BP 09/28/18 1225 (!) 142/84     Pulse Rate 09/28/18 1225 89     Resp 09/28/18 1225 (!) 25     Temp 09/28/18 1225 98.8 F (37.1 C)     Temp Source 09/28/18 1225 Oral     SpO2 09/28/18 1225 96 %     Weight 09/28/18 1232 270 lb 8.1 oz (122.7 kg)     Height --      Head Circumference --      Peak Flow --      Pain Score 09/28/18 1232 10     Pain Loc --      Pain Edu? --      Excl. in [place]? --     Constitutional: Alert and oriented. Well appearing and in no acute distress. Eyes: Conjunctivae are normal. EOMI. Head: Atraumatic. Nose: No congestion/rhinnorhea. Mouth/Throat: Mucous membranes are moist.  Full range of motion of neck Neck: No stridor. Trachea Midline. FROM Cardiovascular: Normal rate, regular rhythm. Grossly normal heart sounds.  Good peripheral circulation. Respiratory: Bilateral wheezing, normal work of breathing. Gastrointestinal: Soft and nontender. No distention. No abdominal bruits.  Musculoskeletal: No lower extremity tenderness nor edema.  No joint effusions. Neurologic:  Normal speech and language. No gross  focal neurologic deficits are appreciated.  Skin:  Skin is warm, dry and intact. No rash noted. Psychiatric: Mood and affect are normal. Speech and behavior are normal. GU: Deferred   ____________________________________________   LABS (all labs ordered are listed, but only abnormal results are displayed)  Labs Reviewed  COMPREHENSIVE METABOLIC PANEL - Abnormal; Notable for the following components:      Result Value   Glucose, Bld 111 (*)    All other components within normal limits  SARS CORONAVIRUS 2 (HOSPITAL ORDER, [place] [facility])  URINE CULTURE  CBC WITH DIFFERENTIAL/PLATELET  BRAIN NATRIURETIC PEPTIDE  URINALYSIS, ROUTINE W REFLEX MICROSCOPIC  TROPONIN I (HIGH SENSITIVITY)  TROPONIN I (HIGH SENSITIVITY)   ____________________________________________   ED ECG  REPORT I, Vanessa [place], the attending physician, personally viewed and interpreted this ECG.  EKG is normal sinus rate of 89, no ST elevation, no T wave inversion, normal intervals ____________________________________________  RADIOLOGY Robert Bellow, personally viewed and evaluated these images (plain radiographs) as part of my medical decision making, as well as reviewing the written report by the radiologist.  ED MD interpretation: Chest x-ray is negative  Official radiology report(s): Dg Chest Portable 1 View  Result Date: 09/28/2018 CLINICAL DATA:  Shortness of breath. EXAM: PORTABLE CHEST 1 VIEW COMPARISON:  September 28, 2017 FINDINGS: The heart size and mediastinal contours are within normal limits. Both lungs are clear. The visualized skeletal structures are unremarkable. IMPRESSION: No active disease. Electronically Signed   By: Dorise Bullion III M.D   On: 09/28/2018 13:55    ____________________________________________   PROCEDURES  Procedure(s) performed (including Critical Care):  Procedures   ____________________________________________   INITIAL IMPRESSION /  ASSESSMENT AND PLAN / ED COURSE   MIKAYA BUNNER was evaluated in Emergency Department on 09/28/2018 for the symptoms described in the history of present illness. She was evaluated in the context of the global COVID-19 pandemic, which necessitated consideration that the patient might be at risk for infection with the SARS-CoV-2 virus that causes COVID-19. Institutional protocols and algorithms that pertain to the evaluation of patients at risk for COVID-19 are in a state of rapid change based on information released by regulatory bodies including the CDC and federal and state organizations. These policies and algorithms were followed during the patient's care in the ED.     Pt presents with SOB. Differential includes: Most likely asthma exacerbation given patient's physical exam.  However also considered  PNA-will get xray to evaluation Anemia-CBC to evaluate ACS- will get trops Arrhythmia-Will get EKG and keep on monitor.  COVID- will get testing per algorithm. PE-lower suspicion given no risk factors and other cause more likely  Also considered meningitis given patient's report of headaches but patient has supple neck and has no further risk factors for such.  Discussed lumbar puncture but at this time will hold off given patient's reassuring exam and very low suspicion for meningitis.   No evidence of heart failure, ACS.  Coronavirus test negative.  Patient given steroids, breathing treatments. Rx for headaches.   Patient handed off to oncoming team pending urine and duoneb rxs.. Anticipate patient will be discharged home as long as her breathing is better with the duo nebs.  However consider admission if she continues to feel short of breath and wheezing.  Given patient was reported febrile and the shortness of breath will give a course of doxycycline to cover the potential of a pneumonia not seen on the x-ray.  We will also give steroids for her asthma.          Clinical Course as  of Sep 28 1639  Tue Sep 28, 2018  1424 proBNP is normal.  Troponin is normal white count is normal   [MF]  1424 Chest x-ray without evidence of pneumonia.   [MF]  1448 Re-evaluated pt. Headache is better. SOB is better.  Ask to have urine tested.    [MF]  1526 Reevaluated patient.  Updated patient on results.  Is reassured that her cornovirus testing was negative.   [MF]    Clinical Course User Index [MF] Vanessa [place], MD     ____________________________________________   FINAL CLINICAL IMPRESSION(S) / ED DIAGNOSES   Final diagnoses:  Shortness of  breath     MEDICATIONS GIVEN DURING THIS VISIT:  Medications  sodium chloride 0.9 % bolus 1,000 mL (has no administration in time range)  methylPREDNISolone sodium succinate (SOLU-MEDROL) 125 mg/2 mL injection 125 mg (125 mg Intravenous Given 09/28/18 1353)  ketorolac (TORADOL) 30 MG/ML injection 15 mg (15 mg Intravenous Given 09/28/18 1353)  acetaminophen (TYLENOL) tablet 650 mg (650 mg Oral Given 09/28/18 1353)  prochlorperazine (COMPAZINE) injection 10 mg (10 mg Intravenous Given 09/28/18 1517)  diphenhydrAMINE (BENADRYL) injection 12.5 mg (12.5 mg Intravenous Given 09/28/18 1517)  ipratropium-albuterol (DUONEB) 0.5-2.5 (3) MG/3ML nebulizer solution 3 mL (3 mLs Nebulization Given 09/28/18 1517)     ED Discharge Orders         Ordered    predniSONE (DELTASONE) 20 MG tablet  Daily with breakfast     09/28/18 1649    azithromycin (ZITHROMAX Z-PAK) 250 MG tablet     09/28/18 1649           Note:  This document was prepared using Dragon voice recognition software and may include unintentional dictation errors.   Vanessa [place], MD 09/28/18 1650

## 2018-09-28 NOTE — ED Notes (Signed)
feeling weak since Sunday, woke up yesterday with headache, achy and sore throat. Sob started a few days ago.

## 2018-09-28 NOTE — Telephone Encounter (Signed)
Patient called to report headache, sore throat, weakness, SOB, mild cough, chills, sweats, fever 101.7, wheezing, and chest tightness. She states symptoms started 2 days ago. She has been using Ibuprofen, inhaler, and Nebulizer with mild relief. Discussed with Dr. Brita Romp. Patient advised to go to the ER for evaluaion and R/O COVID-19.

## 2018-09-28 NOTE — Discharge Instructions (Addendum)
Your x-ray was reassuring.  However given your history of asthma we will give you a course of steroids to help you.  Also given your report of a temperature we will give you some azithromycin to help with inflammation from your asthma as well as treat a possible pneumonia not seen on the x-ray.  You should return immediately to the ER if you developed confusions, worsening breathing or any other concerns.

## 2018-09-28 NOTE — ED Provider Notes (Signed)
-----------------------------------------   5:59 PM on 09/28/2018 -----------------------------------------   UA negative.  Vital signs normal.  Patient feeling much better and eager to be discharged home.  She has been prescribed a course of prednisone and azithromycin by Dr. Jari Pigg.   Carrie Mew, MD 09/28/18 1800

## 2018-09-28 NOTE — Telephone Encounter (Signed)
error 

## 2018-09-29 LAB — URINE CULTURE: Culture: <10000 — AB

## 2018-10-02 ENCOUNTER — Other Ambulatory Visit: Payer: Self-pay

## 2018-10-02 ENCOUNTER — Emergency Department
Admission: EM | Admit: 2018-10-02 | Discharge: 2018-10-02 | Disposition: A | Discharge status: Home / Self Care | Payer: 59 | Attending: Emergency Medicine | Admitting: Emergency Medicine

## 2018-10-02 ENCOUNTER — Emergency Department: Payer: 59

## 2018-10-02 DIAGNOSIS — Z87891 Personal history of nicotine dependence: Secondary | ICD-10-CM | POA: Insufficient documentation

## 2018-10-02 DIAGNOSIS — Z9049 Acquired absence of other specified parts of digestive tract: Secondary | ICD-10-CM | POA: Insufficient documentation

## 2018-10-02 DIAGNOSIS — Z79899 Other long term (current) drug therapy: Secondary | ICD-10-CM | POA: Insufficient documentation

## 2018-10-02 DIAGNOSIS — I82612 Acute embolism and thrombosis of superficial veins of left upper extremity: Secondary | ICD-10-CM | POA: Diagnosis not present

## 2018-10-02 DIAGNOSIS — J45909 Unspecified asthma, uncomplicated: Secondary | ICD-10-CM | POA: Diagnosis not present

## 2018-10-02 DIAGNOSIS — R2232 Localized swelling, mass and lump, left upper limb: Secondary | ICD-10-CM | POA: Diagnosis present

## 2018-10-02 MED ORDER — HYDROCODONE-ACETAMINOPHEN 5-325 MG PO TABS: 1.0000 | ORAL_TABLET | Freq: Four times a day (QID) | ORAL | 0 refills | Status: AC | PRN

## 2018-10-02 NOTE — Discharge Instructions (Signed)
Take a 325mg  Aspirin daily until pain, swelling, and redness have resolved.  Apply warm or cool compress.  Follow up with your primary care provider if not improving over the next few days.  REturn to  the ER if symptoms change or worsen and you are unable to see primary care.

## 2018-10-02 NOTE — ED Provider Notes (Signed)
[facility] Emergency Department Provider Note ____________________________________________  Time seen: Approximately 3:34 PM  I have reviewed the triage vital signs and the nursing notes.   HISTORY  Chief Complaint Arm Pain    HPI Megan Jimenez is a 53 y.o. female who presents to the emergency department for evaluation and treatment of left upper extremity pain with swelling and redness. She was here5 days ago and an IV was place in the left forearm. Symptoms started 3 days ago. No relief with cool compress and ibuprofen.  Past Medical History:  Diagnosis Date  . Asthma   . Bipolar 1 disorder [facility])     Patient Active Problem List   Diagnosis Date Noted  . Asthma exacerbation 09/29/2017  . Allergic rhinitis 11/17/2014  . Anxiety 09/08/2014  . Acute asthma exacerbation 09/08/2014  . Affective bipolar disorder ([place]) 09/08/2014  . 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/08/2014  . Headache, migraine 09/08/2014  . Excess, menstruation 09/08/2014  . Class 2 severe obesity due to excess calories with serious comorbidity and body mass index (BMI) of 39.0 to 39.9 in adult [facility]) 09/08/2014    Past Surgical History:  Procedure Laterality Date  . ANKLE FRACTURE SURGERY Right 1992  . CHOLECYSTECTOMY      Prior to Admission medications   Medication Sig Start Date End Date Taking? Authorizing Provider  albuterol (PROVENTIL HFA;VENTOLIN HFA) 108 (90 Base) MCG/ACT inhaler Inhale 2 puffs into the lungs every 6 (six) hours as needed for wheezing or shortness of breath. 04/26/18   Mar Daring, PA-C  ALPRAZolam Duanne Moron) 1 MG tablet Take 1 mg by mouth 3 (three) times daily as needed for anxiety.    [provider]  azithromycin (ZITHROMAX Z-PAK) 250 MG tablet Take 2 tablets (500 mg) on  Day 1,  followed by 1 tablet (250 mg) once daily on Days 2 through 5. 09/28/18 10/03/18  Carrie Mew, MD  ferrous sulfate 325 (65 FE) MG tablet Take 325 mg by mouth daily with  breakfast.    [provider]  fluticasone (FLONASE) 50 MCG/ACT nasal spray Place 2 sprays into both nostrils daily. 09/30/17   Dustin Flock, MD  fluticasone (FLOVENT HFA) 110 MCG/ACT inhaler Inhale 2 puffs into the lungs daily. 04/26/18   Mar Daring, PA-C  furosemide (LASIX) 20 MG tablet Take 1 tablet (20 mg total) by mouth daily. 04/30/18   Mar Daring, PA-C  HYDROcodone-acetaminophen (NORCO/VICODIN) 5-325 MG tablet Take 1 tablet by mouth every 6 (six) hours as needed for up to 3 days for moderate pain. 10/02/18 10/05/18  , Johnette Abraham B, FNP  ipratropium-albuterol (DUONEB) 0.5-2.5 (3) MG/3ML SOLN Take 3 mLs by nebulization every 6 (six) hours as needed. 09/30/17   Dustin Flock, MD  loratadine (CLARITIN) 10 MG tablet Take 10 mg by mouth daily as needed for allergies.    [provider]  magnesium oxide (MAG-OX) 400 MG tablet Take 400 mg by mouth every evening.    [provider]  meloxicam (MOBIC) 15 MG tablet Take 1 tablet (15 mg total) by mouth daily. 04/26/18   Mar Daring, PA-C  mometasone-formoterol (DULERA) 200-5 MCG/ACT AERO Inhale 2 puffs into the lungs 2 (two) times daily. 09/30/17   Dustin Flock, MD  montelukast (SINGULAIR) 10 MG tablet TAKE 1 TABLET BY MOUTH ONCE DAILY IN THE MORNING 08/16/18   Mar Daring, PA-C  Multiple Vitamin (MULTIVITAMIN WITH MINERALS) TABS tablet Take 1 tablet by mouth daily.    [provider]  potassium chloride  SA (K-DUR,KLOR-CON) 20 MEQ tablet Take 1 tablet (20 mEq total) by mouth daily. 04/30/18   Mar Daring, PA-C  predniSONE (DELTASONE) 20 MG tablet Take 2 tablets (40 mg total) by mouth daily with breakfast for 5 days. 09/28/18 10/03/18  Vanessa [place], MD  Vitamin D, Ergocalciferol, (DRISDOL) 50000 units CAPS capsule Take 50,000 Units by mouth every Friday.     [provider]    Allergies Patient has no known allergies.  Family History  Problem Relation Age of  Onset  . COPD Mother   . Hyperlipidemia Mother   . Cancer Mother        Lung cancer  . Hyperlipidemia Father   . Leukemia Father   . COPD Father   . Depression Sister        ANXIETY  . Healthy Sister     Social History Social History   Tobacco Use  . Smoking status: Former Smoker    Quit date: 03/17/2004    Years since quitting: 14.5  . Smokeless tobacco: Never Used  Substance Use Topics  . Alcohol use: No  . Drug use: No    Review of Systems Constitutional: Negative for fever. Cardiovascular: Negative for chest pain. Respiratory: Negative for shortness of breath. Musculoskeletal: Positive for left upper extremity pain. Skin: Positive for redness and swelling in the left upper extremity.  Neurological: Positive for decrease in sensation on the volar aspect of the left forearm below the elbow.  ____________________________________________   PHYSICAL EXAM:  VITAL SIGNS: ED Triage Vitals [10/02/18 1137]  Enc Vitals Group     BP 131/87     Pulse Rate 78     Resp 18     Temp 98.4 F (36.9 C)     Temp Source Oral     SpO2 98 %     Weight      Height      Head Circumference      Peak Flow      Pain Score 6     Pain Loc      Pain Edu?      Excl. in [place]?     Constitutional: Alert and oriented. Well appearing and in no acute distress. Eyes: Conjunctivae are clear without discharge or drainage Head: Atraumatic Neck: Supple Respiratory: No cough. Respirations are even and unlabored. Musculoskeletal: Left arm swelling noted. FROM. Neurologic: Sensation on the volar aspect of the left forearm is slightly decreased, however she still has two-point sensation. Skin: Left upper arm on the medial aspect is erythematous.  Erythema extends down toward the forearm but does not extend over joint space Psychiatric: Affect and behavior are appropriate.  ____________________________________________   LABS (all labs ordered are listed, but only abnormal results are  displayed)  Labs Reviewed - No data to display ____________________________________________  RADIOLOGY  Ultrasound does show a occlusive superficial venous thrombosis within the left basilic vein in the mid and distal upper arm at the site of the prior IV insertion.  There is no evidence of a DVT. ____________________________________________   PROCEDURES  Procedures  ____________________________________________   INITIAL IMPRESSION / ASSESSMENT AND PLAN / ED COURSE  JORDAYN MINK is a 53 y.o. who presents to the emergency department for treatment and evaluation of left arm pain 5 days post IV.  Exam is concerning for either thrombosis or infection.  Ultrasound has been ordered.  Ultrasound does show that she has a superficial venous thrombosis in the left basilic vein which is consistent with  her recent history of having an IV.  She will be treated with aspirin daily and cool compresses.  Patient states that the pain is preventing her from being able to get any rest therefore a few tablets of Norco were submitted to her pharmacy.  She was advised to follow-up with her primary care provider next week.  She was encouraged to return to the emergency department for symptoms that change or worsen if she is unable to schedule appointment.  Medications - No data to display  Pertinent labs & imaging results that were available during my care of the patient were reviewed by me and considered in my medical decision making (see chart for details).  _________________________________________   FINAL CLINICAL IMPRESSION(S) / ED DIAGNOSES  Final diagnoses:  Acute thrombosis of left basilic vein    ED Discharge Orders         Ordered    HYDROcodone-acetaminophen (NORCO/VICODIN) 5-325 MG tablet  Every 6 hours PRN     10/02/18 1432           If controlled substance prescribed during this visit, 12 month history viewed on the [place] prior to issuing an initial prescription for  Schedule II or III opiod.    Victorino Dike, FNP 10/02/18 1541    Carrie Mew, MD 10/03/18 [phone]

## 2018-10-02 NOTE — ED Triage Notes (Signed)
Pt arrived via POV to the ED d/t left arm pain. Pt states she was seen here at [facility] ED for SOB and bronchitis. Pt was treated in the ED and discharged that night. Pt c/o left arm pain, swelling, and numbness where her IV was placed Tuesday. Pt states it is getting difficult to lift her arm. Pt is A&O x4 at this time with stable VS.

## 2018-11-26 ENCOUNTER — Other Ambulatory Visit: Payer: Self-pay

## 2018-11-26 ENCOUNTER — Encounter: Payer: Self-pay | Admitting: Family Medicine

## 2018-11-26 ENCOUNTER — Ambulatory Visit: Payer: 59 | Admitting: Family Medicine

## 2018-11-26 VITALS — BP 138/89 | HR 90 | Temp 97.1°F | Wt 284.4 lb

## 2018-11-26 DIAGNOSIS — L0291 Cutaneous abscess, unspecified: Secondary | ICD-10-CM | POA: Diagnosis not present

## 2018-11-26 MED ORDER — DOXYCYCLINE HYCLATE 100 MG PO TABS: 100.0000 mg | ORAL_TABLET | Freq: Two times a day (BID) | ORAL | 0 refills | Status: AC

## 2018-11-26 NOTE — Patient Instructions (Signed)
Skin Abscess  A skin abscess is an infected area on or under your skin that contains a collection of pus and other material. An abscess may also be called a furuncle, carbuncle, or boil. An abscess can occur in or on almost any part of your body. Some abscesses break open (rupture) on their own. Most continue to get worse unless they are treated. The infection can spread deeper into the body and eventually into your blood, which can make you feel ill. Treatment usually involves draining the abscess. What are the causes? An abscess occurs when germs, like bacteria, pass through your skin and cause an infection. This may be caused by:  A scrape or cut on your skin.  A puncture wound through your skin, including a needle injection or insect bite.  Blocked oil or sweat glands.  Blocked and infected hair follicles.  A cyst that forms beneath your skin (sebaceous cyst) and becomes infected. What increases the risk? This condition is more likely to develop in people who:  Have a weak body defense system (immune system).  Have diabetes.  Have dry and irritated skin.  Get frequent injections or use illegal IV drugs.  Have a foreign body in a wound, such as a splinter.  Have problems with their lymph system or veins. What are the signs or symptoms? Symptoms of this condition include:  A painful, firm bump under the skin.  A bump with pus at the top. This may break through the skin and drain. Other symptoms include:  Redness surrounding the abscess site.  Warmth.  Swelling of the lymph nodes (glands) near the abscess.  Tenderness.  A sore on the skin. How is this diagnosed? This condition may be diagnosed based on:  A physical exam.  Your medical history.  A sample of pus. This may be used to find out what is causing the infection.  Blood tests.  Imaging tests, such as an ultrasound, CT scan, or MRI. How is this treated? A small abscess that drains on its own may not  need treatment. Treatment for larger abscesses may include:  Moist heat or heat pack applied to the area several times a day.  A procedure to drain the abscess (incision and drainage).  Antibiotic medicines. For a severe abscess, you may first get antibiotics through an IV and then change to antibiotics by mouth. Follow these instructions at home: Medicines   Take over-the-counter and prescription medicines only as told by your health care provider.  If you were prescribed an antibiotic medicine, take it as told by your health care provider. Do not stop taking the antibiotic even if you start to feel better. Abscess care   If you have an abscess that has not drained, apply heat to the affected area. Use the heat source that your health care provider recommends, such as a moist heat pack or a heating pad. ? Place a towel between your skin and the heat source. ? Leave the heat on for 20-30 minutes. ? Remove the heat if your skin turns bright red. This is especially important if you are unable to feel pain, heat, or cold. You may have a greater risk of getting burned.  Follow instructions from your health care provider about how to take care of your abscess. Make sure you: ? Cover the abscess with a bandage (dressing). ? Change your dressing or gauze as told by your health care provider. ? Wash your hands with soap and water before you change the   dressing or gauze. If soap and water are not available, use hand sanitizer.  Check your abscess every day for signs of a worsening infection. Check for: ? More redness, swelling, or pain. ? More fluid or blood. ? Warmth. ? More pus or a bad smell. General instructions  To avoid spreading the infection: ? Do not share personal care items, towels, or hot tubs with others. ? Avoid making skin contact with other people.  Keep all follow-up visits as told by your health care provider. This is important. Contact a health care provider if you  have:  More redness, swelling, or pain around your abscess.  More fluid or blood coming from your abscess.  Warm skin around your abscess.  More pus or a bad smell coming from your abscess.  A fever.  Muscle aches.  Chills or a general ill feeling. Get help right away if you:  Have severe pain.  See red streaks on your skin spreading away from the abscess. Summary  A skin abscess is an infected area on or under your skin that contains a collection of pus and other material.  A small abscess that drains on its own may not need treatment.  Treatment for larger abscesses may include having a procedure to drain the abscess and taking an antibiotic. This information is not intended to replace advice given to you by your health care provider. Make sure you discuss any questions you have with your health care provider. Document Released: 12/11/2004 Document Revised: 06/24/2018 Document Reviewed: 04/16/2017 Elsevier Patient Education  2020 [employer]  

## 2018-11-26 NOTE — Progress Notes (Signed)
Patient: Megan Jimenez Female    DOB: [birth year]   53 y.o.   MRN: [id number] Visit Date: 11/26/2018  Today's Provider: Lavon Paganini, MD   Chief Complaint  Patient presents with  . Lump   Subjective:    I, Tiburcio Pea, CMA, am acting as a scribe for Lavon Paganini, MD.    HPI Lump: Patient presents today for a lump under left arm. Patient states the lump appeared this week after increasing Lamictal dose on Monday (5 days ago). She also has some swelling and rash over different areas of her body.   She has been on Lamictal for ~1 month Started itching after taking higher dose of Lamictal Became a hard knot 3 days ago and now growing Very TTP  She has also had more leg swelling since starting Lamictal and feeling tired and achy  She tried tylenol and ibuprofen for the discomfort  Lamictal is prescribed by Psych (Dr Robina Ade) - she discussed with her as she is worried about having several side effects and was told to stop the medication   No Known Allergies   Current Outpatient Medications:  .  albuterol (PROVENTIL HFA;VENTOLIN HFA) 108 (90 Base) MCG/ACT inhaler, Inhale 2 puffs into the lungs every 6 (six) hours as needed for wheezing or shortness of breath., Disp: 1 Inhaler, Rfl: 5 .  ALPRAZolam (XANAX) 1 MG tablet, Take 1 mg by mouth 3 (three) times daily as needed for anxiety., Disp: , Rfl:  .  ferrous sulfate 325 (65 FE) MG tablet, Take 325 mg by mouth daily with breakfast., Disp: , Rfl:  .  fluticasone (FLONASE) 50 MCG/ACT nasal spray, Place 2 sprays into both nostrils daily., Disp: 16 g, Rfl: 2 .  fluticasone (FLOVENT HFA) 110 MCG/ACT inhaler, Inhale 2 puffs into the lungs daily., Disp: 1 Inhaler, Rfl: 12 .  furosemide (LASIX) 20 MG tablet, Take 1 tablet (20 mg total) by mouth daily., Disp: 30 tablet, Rfl: 3 .  ipratropium-albuterol (DUONEB) 0.5-2.5 (3) MG/3ML SOLN, Take 3 mLs by nebulization every 6 (six) hours as needed., Disp: 360 mL, Rfl: 2 .   loratadine (CLARITIN) 10 MG tablet, Take 10 mg by mouth daily as needed for allergies., Disp: , Rfl:  .  magnesium oxide (MAG-OX) 400 MG tablet, Take 400 mg by mouth every evening., Disp: , Rfl:  .  meloxicam (MOBIC) 15 MG tablet, Take 1 tablet (15 mg total) by mouth daily., Disp: 30 tablet, Rfl: 5 .  mometasone-formoterol (DULERA) 200-5 MCG/ACT AERO, Inhale 2 puffs into the lungs 2 (two) times daily., Disp: 1 Inhaler, Rfl: 2 .  montelukast (SINGULAIR) 10 MG tablet, TAKE 1 TABLET BY MOUTH ONCE DAILY IN THE MORNING, Disp: 90 tablet, Rfl: 1 .  Multiple Vitamin (MULTIVITAMIN WITH MINERALS) TABS tablet, Take 1 tablet by mouth daily., Disp: , Rfl:  .  potassium chloride SA (K-DUR,KLOR-CON) 20 MEQ tablet, Take 1 tablet (20 mEq total) by mouth daily., Disp: 30 tablet, Rfl: 3 .  Vitamin D, Ergocalciferol, (DRISDOL) 50000 units CAPS capsule, Take 50,000 Units by mouth every Friday. , Disp: , Rfl: 5  Review of Systems  Constitutional: Negative.   Respiratory: Negative.   Cardiovascular: Negative.   Musculoskeletal: Negative.     Social History   Tobacco Use  . Smoking status: Former Smoker    Quit date: 03/17/2004    Years since quitting: 14.7  . Smokeless tobacco: Never Used  Substance Use Topics  . Alcohol use: No  Objective:   BP 138/89 (BP Location: Right Arm, Patient Position: Sitting, Cuff Size: Large)   Pulse 90   Temp (!) 97.1 F (36.2 C) (Oral)   Wt 284 lb 6.4 oz (129 kg)   SpO2 97%   BMI 42.00 kg/m  Vitals:   11/26/18 0857  BP: 138/89  Pulse: 90  Temp: (!) 97.1 F (36.2 C)  TempSrc: Oral  SpO2: 97%  Weight: 284 lb 6.4 oz (129 kg)  Body mass index is 42 kg/m.   Physical Exam Vitals signs reviewed.  Constitutional:      General: She is not in acute distress.    Appearance: She is well-developed.  HENT:     Head: Normocephalic and atraumatic.  Eyes:     General: No scleral icterus.    Conjunctiva/sclera: Conjunctivae normal.  Cardiovascular:     Rate and  Rhythm: Normal rate and regular rhythm.     Pulses: Normal pulses.     Heart sounds: Normal heart sounds. No murmur.  Pulmonary:     Effort: Pulmonary effort is normal. No respiratory distress.     Breath sounds: Normal breath sounds.  Skin:    General: Skin is warm and dry.     Capillary Refill: Capillary refill takes less than 2 seconds.     Comments: L axilla with 3cm area of induration and no fluctuance. Erythematous and TTP  Neurological:     Mental Status: She is alert and oriented to person, place, and time.  Psychiatric:        Behavior: Behavior normal.      No results found for any visits on 11/26/18.     Assessment & Plan   1. Abscess - new problem - rassured patient that this is not a medication side effect or a lymph node related to breast cancer - no fluctuance that would benefit from I&D - will treat with warm compresses and 7 day course of doxycycline - return precautions discussed    Meds ordered this encounter  Medications  . doxycycline (VIBRA-TABS) 100 MG tablet    Sig: Take 1 tablet (100 mg total) by mouth 2 (two) times daily for 7 days.    Dispense:  14 tablet    Refill:  0     Return if symptoms worsen or fail to improve.   The entirety of the information documented in the History of Present Illness, Review of Systems and Physical Exam were personally obtained by me. Portions of this information were initially documented by Tiburcio Pea, CMA and reviewed by me for thoroughness and accuracy.    Bacigalupo, Dionne Bucy, MD MPH [facility]

## 2018-12-16 ENCOUNTER — Other Ambulatory Visit: Payer: Self-pay | Admitting: Physician Assistant

## 2018-12-16 DIAGNOSIS — Z1231 Encounter for screening mammogram for malignant neoplasm of breast: Secondary | ICD-10-CM

## 2018-12-24 ENCOUNTER — Telehealth: Payer: Self-pay

## 2018-12-24 ENCOUNTER — Telehealth: Payer: Self-pay | Admitting: Gastroenterology

## 2018-12-24 ENCOUNTER — Other Ambulatory Visit: Payer: Self-pay

## 2018-12-24 DIAGNOSIS — Z1211 Encounter for screening for malignant neoplasm of colon: Secondary | ICD-10-CM

## 2018-12-24 NOTE — Telephone Encounter (Signed)
Gastroenterology Pre-Procedure Review  Request Date: 01/04/19 Requesting Physician: Dr. Vicente Males  PATIENT REVIEW QUESTIONS: The patient responded to the following health history questions as indicated:    1. Are you having any GI issues? no 2. Do you have a personal history of Polyps? no 3. Do you have a family history of Colon Cancer or Polyps? yes (Mother colon polyps, grandmother Colon Cancer) 4. Diabetes Mellitus? no 5. Joint replacements in the past 12 months?no 6. Major health problems in the past 3 months?no 7. Any artificial heart valves, MVP, or defibrillator?no    MEDICATIONS & ALLERGIES:    Patient reports the following regarding taking any anticoagulation/antiplatelet therapy:   Plavix, Coumadin, Eliquis, Xarelto, Lovenox, Pradaxa, Brilinta, or Effient? no Aspirin? no  Patient confirms/reports the following medications:  Current Outpatient Medications  Medication Sig Dispense Refill  . albuterol (PROVENTIL HFA;VENTOLIN HFA) 108 (90 Base) MCG/ACT inhaler Inhale 2 puffs into the lungs every 6 (six) hours as needed for wheezing or shortness of breath. 1 Inhaler 5  . ALPRAZolam (XANAX) 1 MG tablet Take 1 mg by mouth 3 (three) times daily as needed for anxiety.    . ferrous sulfate 325 (65 FE) MG tablet Take 325 mg by mouth daily with breakfast.    . fluticasone (FLONASE) 50 MCG/ACT nasal spray Place 2 sprays into both nostrils daily. 16 g 2  . fluticasone (FLOVENT HFA) 110 MCG/ACT inhaler Inhale 2 puffs into the lungs daily. 1 Inhaler 12  . furosemide (LASIX) 20 MG tablet Take 1 tablet (20 mg total) by mouth daily. 30 tablet 3  . ipratropium-albuterol (DUONEB) 0.5-2.5 (3) MG/3ML SOLN Take 3 mLs by nebulization every 6 (six) hours as needed. 360 mL 2  . loratadine (CLARITIN) 10 MG tablet Take 10 mg by mouth daily as needed for allergies.    . magnesium oxide (MAG-OX) 400 MG tablet Take 400 mg by mouth every evening.    . meloxicam (MOBIC) 15 MG tablet Take 1 tablet (15 mg total) by  mouth daily. 30 tablet 5  . mometasone-formoterol (DULERA) 200-5 MCG/ACT AERO Inhale 2 puffs into the lungs 2 (two) times daily. 1 Inhaler 2  . montelukast (SINGULAIR) 10 MG tablet TAKE 1 TABLET BY MOUTH ONCE DAILY IN THE MORNING 90 tablet 1  . Multiple Vitamin (MULTIVITAMIN WITH MINERALS) TABS tablet Take 1 tablet by mouth daily.    . potassium chloride SA (K-DUR,KLOR-CON) 20 MEQ tablet Take 1 tablet (20 mEq total) by mouth daily. 30 tablet 3  . Vitamin D, Ergocalciferol, (DRISDOL) 50000 units CAPS capsule Take 50,000 Units by mouth every Friday.   5   No current facility-administered medications for this visit.     Patient confirms/reports the following allergies:  No Known Allergies  No orders of the defined types were placed in this encounter.   AUTHORIZATION INFORMATION Primary Insurance: 1D#: Group #:  Secondary Insurance: 1D#: Group #:  SCHEDULE INFORMATION: Date: 01/04/19 Time: Location:[facility]

## 2018-12-24 NOTE — Telephone Encounter (Signed)
Patient called & would like to schedule a colonoscopy. °

## 2018-12-24 NOTE — Telephone Encounter (Signed)
LVM returning patients call to schedule colonoscopy.  Thanks Michelle 

## 2018-12-27 ENCOUNTER — Encounter: Payer: Self-pay | Admitting: *Deleted

## 2018-12-27 ENCOUNTER — Telehealth: Payer: Self-pay

## 2018-12-27 NOTE — Telephone Encounter (Signed)
LVM for patient regarding her colonoscopy instructions.  Her instructions are not going to get to her in time to prepare for the procedure.  Asked if she would like to come by the office to pick up instructions.  Thanks [employer]

## 2018-12-29 ENCOUNTER — Telehealth: Payer: Self-pay | Admitting: Gastroenterology

## 2018-12-29 NOTE — Telephone Encounter (Signed)
Pt left vm she states someone asked her to pick up the paperwork for her procedure but she can not. she would like to cancel her procedure and she will call us back  To reschedule at a later time she can not take any more time of work due to her being sick to pick up the instructions

## 2018-12-29 NOTE — Telephone Encounter (Signed)
LVM for pt to let her know that we received her message to cancel her colonoscopy due to sickness, and unable to pick up her instructions.  Trish in Endo has been informed of cancellation.  Referral noted.  Thanks [employer]

## 2018-12-30 ENCOUNTER — Telehealth: Payer: Self-pay | Admitting: Gastroenterology

## 2018-12-30 NOTE — Telephone Encounter (Signed)
LVM returning patients call to reschedule her colonoscopy.  Thanks [employer]

## 2018-12-30 NOTE — Telephone Encounter (Signed)
Pt is calling back to r/s her procedure please call pt

## 2018-12-31 ENCOUNTER — Other Ambulatory Visit: Admission: RE | Admit: 2018-12-31 | Payer: 59 | Source: Ambulatory Visit

## 2019-01-04 ENCOUNTER — Ambulatory Visit: Admit: 2019-01-04 | Payer: 59 | Admitting: Gastroenterology

## 2019-01-04 SURGERY — COLONOSCOPY WITH PROPOFOL
Anesthesia: General

## 2019-01-21 ENCOUNTER — Other Ambulatory Visit: Payer: Self-pay

## 2019-01-21 ENCOUNTER — Emergency Department
Admission: EM | Admit: 2019-01-21 | Discharge: 2019-01-21 | Disposition: A | Discharge status: Home / Self Care | Payer: 59 | Attending: Emergency Medicine | Admitting: Emergency Medicine

## 2019-01-21 ENCOUNTER — Encounter: Payer: Self-pay | Admitting: Emergency Medicine

## 2019-01-21 ENCOUNTER — Emergency Department: Payer: 59

## 2019-01-21 DIAGNOSIS — R0981 Nasal congestion: Secondary | ICD-10-CM | POA: Insufficient documentation

## 2019-01-21 DIAGNOSIS — Z79899 Other long term (current) drug therapy: Secondary | ICD-10-CM | POA: Diagnosis not present

## 2019-01-21 DIAGNOSIS — J3489 Other specified disorders of nose and nasal sinuses: Secondary | ICD-10-CM | POA: Insufficient documentation

## 2019-01-21 DIAGNOSIS — Z87891 Personal history of nicotine dependence: Secondary | ICD-10-CM | POA: Diagnosis not present

## 2019-01-21 DIAGNOSIS — R05 Cough: Secondary | ICD-10-CM | POA: Diagnosis not present

## 2019-01-21 DIAGNOSIS — J069 Acute upper respiratory infection, unspecified: Secondary | ICD-10-CM | POA: Insufficient documentation

## 2019-01-21 DIAGNOSIS — J45901 Unspecified asthma with (acute) exacerbation: Secondary | ICD-10-CM | POA: Diagnosis not present

## 2019-01-21 DIAGNOSIS — R062 Wheezing: Secondary | ICD-10-CM | POA: Diagnosis present

## 2019-01-21 LAB — COMPREHENSIVE METABOLIC PANEL
ALT: 38 U/L (ref 0–44)
AST: 30 U/L (ref 15–41)
Albumin: 3.8 g/dL (ref 3.5–5.0)
Alkaline Phosphatase: 54 U/L (ref 38–126)
Anion gap: 8 (ref 5–15)
BUN: 14 mg/dL (ref 6–20)
CO2: 29 mmol/L (ref 22–32)
Calcium: 8.8 mg/dL — ABNORMAL LOW (ref 8.9–10.3)
Chloride: 104 mmol/L (ref 98–111)
Creatinine, Ser: 0.49 mg/dL (ref 0.44–1.00)
GFR calc Af Amer: >60 mL/min (ref >60)
GFR calc non Af Amer: >60 mL/min (ref >60)
Glucose, Bld: 128 mg/dL — ABNORMAL HIGH (ref 70–99)
Potassium: 3.5 mmol/L (ref 3.5–5.1)
Sodium: 141 mmol/L (ref 135–145)
Total Bilirubin: 0.3 mg/dL (ref 0.3–1.2)
Total Protein: 6.4 g/dL — ABNORMAL LOW (ref 6.5–8.1)

## 2019-01-21 LAB — URINALYSIS, COMPLETE (UACMP) WITH MICROSCOPIC
Bacteria, UA: NONE SEEN
Bilirubin Urine: NEGATIVE
Glucose, UA: NEGATIVE mg/dL
Hgb urine dipstick: NEGATIVE
Ketones, ur: 5 mg/dL — AB
Leukocytes,Ua: NEGATIVE
Nitrite: NEGATIVE
Protein, ur: 30 mg/dL — AB
Specific Gravity, Urine: 1.036 — ABNORMAL HIGH (ref 1.005–1.030)
pH: 5 (ref 5.0–8.0)

## 2019-01-21 LAB — CBC
HCT: 44.6 % (ref 36.0–46.0)
Hemoglobin: 14 g/dL (ref 12.0–15.0)
MCH: 30.6 pg (ref 26.0–34.0)
MCHC: 31.4 g/dL (ref 30.0–36.0)
MCV: 97.6 fL (ref 80.0–100.0)
Platelets: 209 10*3/uL (ref 150–400)
RBC: 4.57 MIL/uL (ref 3.87–5.11)
RDW: 12.9 % (ref 11.5–15.5)
WBC: 4.8 10*3/uL (ref 4.0–10.5)
nRBC: 0 % (ref 0.0–0.2)

## 2019-01-21 MED ORDER — IPRATROPIUM-ALBUTEROL 0.5-2.5 (3) MG/3ML IN SOLN: 3.0000 mL | Freq: Once | RESPIRATORY_TRACT | Status: DC

## 2019-01-21 MED ORDER — IPRATROPIUM-ALBUTEROL 20-100 MCG/ACT IN AERS
2.0000 | INHALATION_SPRAY | Freq: Four times a day (QID) | RESPIRATORY_TRACT | Status: DC
  Administered 2019-01-21: 22:00:00 2 via RESPIRATORY_TRACT
  Filled 2019-01-21: qty 4

## 2019-01-21 MED ORDER — PREDNISONE 50 MG PO TABS: ORAL_TABLET | ORAL | 0 refills | Status: DC

## 2019-01-21 MED ORDER — METHYLPREDNISOLONE SODIUM SUCC 125 MG IJ SOLR
125.0000 mg | Freq: Once | INTRAMUSCULAR | Status: AC
  Administered 2019-01-21: 125 mg via INTRAVENOUS
  Filled 2019-01-21: qty 2

## 2019-01-21 NOTE — ED Notes (Signed)
Pt to the er for headahce and nasal congestion and cough. Pt taking dayquil for symptoms. Headache eases after the otc meds. Pt reports vision changes. Pt now has loss of taste or smell.

## 2019-01-21 NOTE — ED Provider Notes (Signed)
[facility] Emergency Department Provider Note  ____________________________________________  Time seen: Approximately 10:43 PM  I have reviewed the triage vital signs and the nursing notes.   HISTORY  Chief Complaint Headache, Nasal Congestion, and Weakness    HPI Megan Jimenez is a 53 y.o. female presents to the emergency department with concern for wheezing.  Patient has had rhinorrhea, nasal congestion and nonproductive cough for approximately 1 week.  Patient states that she went to a local urgent care prior to presenting to the emergency department and was tested for Covid.  She came into the ED tonight because of her wheezing.  She denies chest pain or abdominal pain.  No other alleviating measures have been attempted.        Past Medical History:  Diagnosis Date  . Asthma   . Bipolar 1 disorder [facility])     Patient Active Problem List   Diagnosis Date Noted  . Asthma exacerbation 09/29/2017  . Allergic rhinitis 11/17/2014  . Anxiety 09/08/2014  . Acute asthma exacerbation 09/08/2014  . Affective bipolar disorder ([place]) 09/08/2014  . 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/08/2014  . Headache, migraine 09/08/2014  . Excess, menstruation 09/08/2014  . Class 2 severe obesity due to excess calories with serious comorbidity and body mass index (BMI) of 39.0 to 39.9 in adult [facility]) 09/08/2014    Past Surgical History:  Procedure Laterality Date  . ANKLE FRACTURE SURGERY Right 1992  . CHOLECYSTECTOMY      Prior to Admission medications   Medication Sig Start Date End Date Taking? Authorizing Provider  albuterol (PROVENTIL HFA;VENTOLIN HFA) 108 (90 Base) MCG/ACT inhaler Inhale 2 puffs into the lungs every 6 (six) hours as needed for wheezing or shortness of breath. 04/26/18   Mar Daring, PA-C  ALPRAZolam Duanne Moron) 1 MG tablet Take 1 mg by mouth 3 (three) times daily as needed for anxiety.    [provider]  ferrous sulfate 325 (65 FE) MG tablet  Take 325 mg by mouth daily with breakfast.    [provider]  fluticasone (FLONASE) 50 MCG/ACT nasal spray Place 2 sprays into both nostrils daily. 09/30/17   Dustin Flock, MD  fluticasone (FLOVENT HFA) 110 MCG/ACT inhaler Inhale 2 puffs into the lungs daily. 04/26/18   Mar Daring, PA-C  furosemide (LASIX) 20 MG tablet Take 1 tablet (20 mg total) by mouth daily. 04/30/18   Mar Daring, PA-C  ipratropium-albuterol (DUONEB) 0.5-2.5 (3) MG/3ML SOLN Take 3 mLs by nebulization every 6 (six) hours as needed. 09/30/17   Dustin Flock, MD  loratadine (CLARITIN) 10 MG tablet Take 10 mg by mouth daily as needed for allergies.    [provider]  magnesium oxide (MAG-OX) 400 MG tablet Take 400 mg by mouth every evening.    [provider]  meloxicam (MOBIC) 15 MG tablet Take 1 tablet (15 mg total) by mouth daily. 04/26/18   Mar Daring, PA-C  mometasone-formoterol (DULERA) 200-5 MCG/ACT AERO Inhale 2 puffs into the lungs 2 (two) times daily. 09/30/17   Dustin Flock, MD  montelukast (SINGULAIR) 10 MG tablet TAKE 1 TABLET BY MOUTH ONCE DAILY IN THE MORNING 08/16/18   Mar Daring, PA-C  Multiple Vitamin (MULTIVITAMIN WITH MINERALS) TABS tablet Take 1 tablet by mouth daily.    [provider]  potassium chloride SA (K-DUR,KLOR-CON) 20 MEQ tablet Take 1 tablet (20 mEq total) by mouth daily. 04/30/18   Mar Daring, PA-C  predniSONE (DELTASONE) 50 MG tablet Take one 50  mg tablet once daily for the next five days. 01/21/19   Lannie Fields, PA-C  Vitamin D, Ergocalciferol, (DRISDOL) 50000 units CAPS capsule Take 50,000 Units by mouth every Friday.     [provider]    Allergies Patient has no known allergies.  Family History  Problem Relation Age of Onset  . COPD Mother   . Hyperlipidemia Mother   . Cancer Mother        Lung cancer  . Hyperlipidemia Father   . Leukemia Father   . COPD Father   . Depression Sister         ANXIETY  . Healthy Sister     Social History Social History   Tobacco Use  . Smoking status: Former Smoker    Quit date: 03/17/2004    Years since quitting: 14.8  . Smokeless tobacco: Never Used  Substance Use Topics  . Alcohol use: Yes    Comment: occ  . Drug use: No     Review of Systems  Constitutional: No fever/chills Eyes: No visual changes. No discharge ENT: No upper respiratory complaints. Cardiovascular: no chest pain. Respiratory: Patient has wheezing. Gastrointestinal: No abdominal pain.  No nausea, no vomiting.  No diarrhea.  No constipation. Genitourinary: Negative for dysuria. No hematuria Musculoskeletal: Negative for musculoskeletal pain. Skin: Negative for rash, abrasions, lacerations, ecchymosis. Neurological: Negative for headaches, focal weakness or numbness.   ____________________________________________   PHYSICAL EXAM:  VITAL SIGNS: ED Triage Vitals  Enc Vitals Group     BP 01/21/19 2020 (!) 144/92     Pulse Rate 01/21/19 2020 85     Resp 01/21/19 2020 18     Temp 01/21/19 2020 98.5 F (36.9 C)     Temp Source 01/21/19 2020 Oral     SpO2 01/21/19 2020 94 %     Weight 01/21/19 2021 284 lb (128.8 kg)     Height 01/21/19 2021 5\' 10"  (1.778 m)     Head Circumference --      Peak Flow --      Pain Score 01/21/19 2021 8     Pain Loc --      Pain Edu? --      Excl. in [place]? --      Constitutional: Alert and oriented. Well appearing and in no acute distress. Eyes: Conjunctivae are normal. PERRL. EOMI. Head: Atraumatic. ENT:      Nose: No congestion/rhinnorhea.      Mouth/Throat: Mucous membranes are moist.  Neck: No stridor.  No cervical spine tenderness to palpation. Cardiovascular: Normal rate, regular rhythm. Normal S1 and S2.  Good peripheral circulation. Respiratory: Normal respiratory effort without tachypnea or retractions.  Patient has diffuse expiratory wheezing.  Good air entry to the bases with no decreased or absent breath  sounds. Gastrointestinal: Bowel sounds 4 quadrants. Soft and nontender to palpation. No guarding or rigidity. No palpable masses. No distention. No CVA tenderness. Musculoskeletal: Full range of motion to all extremities. No gross deformities appreciated. Neurologic:  Normal speech and language. No gross focal neurologic deficits are appreciated.  Skin:  Skin is warm, dry and intact. No rash noted. Psychiatric: Mood and affect are normal. Speech and behavior are normal. Patient exhibits appropriate insight and judgement.   ____________________________________________   LABS (all labs ordered are listed, but only abnormal results are displayed)  Labs Reviewed  URINALYSIS, COMPLETE (UACMP) WITH MICROSCOPIC - Abnormal; Notable for the following components:      Result Value   Color, Urine YELLOW (*)  APPearance HAZY (*)    Specific Gravity, Urine 1.036 (*)    Ketones, ur 5 (*)    Protein, ur 30 (*)    All other components within normal limits  COMPREHENSIVE METABOLIC PANEL - Abnormal; Notable for the following components:   Glucose, Bld 128 (*)    Calcium 8.8 (*)    Total Protein 6.4 (*)    All other components within normal limits  CBC   ____________________________________________  EKG   ____________________________________________  RADIOLOGY I personally viewed and evaluated these images as part of my medical decision making, as well as reviewing the written report by the radiologist.  Dg Chest 1 View  Result Date: 01/21/2019 CLINICAL DATA:  Weakness EXAM: CHEST  1 VIEW COMPARISON:  09/28/2018 FINDINGS: The heart size and mediastinal contours are within normal limits. Both lungs are clear. The visualized skeletal structures are unremarkable. IMPRESSION: No active disease. Electronically Signed   By: Donavan Foil M.D.   On: 01/21/2019 20:54    ____________________________________________    PROCEDURES  Procedure(s) performed:    Procedures    Medications   Ipratropium-Albuterol (COMBIVENT) respimat 2 puff (2 puffs Inhalation Given 01/21/19 2212)  methylPREDNISolone sodium succinate (SOLU-MEDROL) 125 mg/2 mL injection 125 mg (125 mg Intravenous Given 01/21/19 2127)     ____________________________________________   INITIAL IMPRESSION / ASSESSMENT AND PLAN / ED COURSE  Pertinent labs & imaging results that were available during my care of the patient were reviewed by me and considered in my medical decision making (see chart for details).  Review of the [place] CSRS was performed in accordance of the [place] prior to dispensing any controlled drugs.          Assessment and plan Unspecified viral URI Asthma 53 year old female presents to the emergency department with concern for wheezing and other viral URI-like symptoms.  Patient's vital signs were reassuring at discharge.  She had diffuse expiratory wheezing that was auscultated on physical exam.  Patient was given albuterol and Atrovent by [facility]I in the emergency department and was given 125 mg of IV Solu-Medrol.  Wheezing improved and patient was sent home with her albuterol/Atrovent inhaler.  Patient was tested by local urgent care for COVID-19 results are pending at this time.  Chest x-ray revealed no consolidations, opacities or infiltrates concerning for pneumonia.  CBC and CMP were otherwise reassuring.   Patient was discharged with prednisone.  She was advised to follow-up with primary care as needed.  Strict return precautions were given to return for new or worsening symptoms.  All patient questions were answered.    ____________________________________________  FINAL CLINICAL IMPRESSION(S) / ED DIAGNOSES  Final diagnoses:  Viral upper respiratory tract infection  Moderate asthma with exacerbation, unspecified whether persistent      NEW MEDICATIONS STARTED DURING THIS VISIT:  ED Discharge Orders         Ordered    predniSONE (DELTASONE) 50 MG tablet     01/21/19 2221               This chart was dictated using voice recognition software/Dragon. Despite best efforts to proofread, errors can occur which can change the meaning. Any change was purely unintentional.    Lannie Fields, PA-C 01/21/19 2307    Nance Pear, MD 01/22/19 1700

## 2019-01-21 NOTE — ED Triage Notes (Signed)
Patient with complaint of headache, congestion and weakness times one week. Patient states that over the last 2 days she has not been able to taste or smell.

## 2019-01-21 NOTE — ED Notes (Signed)
Patient sent to ED from [facility]gent care for further testing.  Per [facility] staff patient with elevated BP  157/117.  Patient with headache, vision changes, photosensitivity, shortness of breath with wheezing.  Reports symptoms for 6 days.

## 2019-02-01 ENCOUNTER — Other Ambulatory Visit: Payer: Self-pay | Admitting: Physician Assistant

## 2019-02-01 ENCOUNTER — Telehealth: Payer: 59 | Admitting: Family Medicine

## 2019-02-01 DIAGNOSIS — J301 Allergic rhinitis due to pollen: Secondary | ICD-10-CM

## 2019-02-01 MED ORDER — MONTELUKAST SODIUM 10 MG PO TABS: ORAL_TABLET | ORAL | 1 refills | Status: DC

## 2019-02-01 NOTE — Telephone Encounter (Signed)
montelukast (SINGULAIR) 10 MG tablet    Patient requesting refill.    Pharmacy:  [id number] [place], [place] - [address] [phone] (Phone) [phone] (Fax)

## 2019-02-01 NOTE — Progress Notes (Deleted)
   {  Method of visit:23308}  Patient: Megan Jimenez Female    DOB: 10-26-1965   53 y.o.   MRN: [id number] Visit Date: 02/01/2019  Today's Provider: Vernie Murders, PA   No chief complaint on file.  Subjective:     HPI    No Known Allergies   Current Outpatient Medications:  .  albuterol (PROVENTIL HFA;VENTOLIN HFA) 108 (90 Base) MCG/ACT inhaler, Inhale 2 puffs into the lungs every 6 (six) hours as needed for wheezing or shortness of breath., Disp: 1 Inhaler, Rfl: 5 .  ALPRAZolam (XANAX) 1 MG tablet, Take 1 mg by mouth 3 (three) times daily as needed for anxiety., Disp: , Rfl:  .  ferrous sulfate 325 (65 FE) MG tablet, Take 325 mg by mouth daily with breakfast., Disp: , Rfl:  .  fluticasone (FLONASE) 50 MCG/ACT nasal spray, Place 2 sprays into both nostrils daily., Disp: 16 g, Rfl: 2 .  fluticasone (FLOVENT HFA) 110 MCG/ACT inhaler, Inhale 2 puffs into the lungs daily., Disp: 1 Inhaler, Rfl: 12 .  furosemide (LASIX) 20 MG tablet, Take 1 tablet (20 mg total) by mouth daily., Disp: 30 tablet, Rfl: 3 .  ipratropium-albuterol (DUONEB) 0.5-2.5 (3) MG/3ML SOLN, Take 3 mLs by nebulization every 6 (six) hours as needed., Disp: 360 mL, Rfl: 2 .  loratadine (CLARITIN) 10 MG tablet, Take 10 mg by mouth daily as needed for allergies., Disp: , Rfl:  .  magnesium oxide (MAG-OX) 400 MG tablet, Take 400 mg by mouth every evening., Disp: , Rfl:  .  meloxicam (MOBIC) 15 MG tablet, Take 1 tablet (15 mg total) by mouth daily., Disp: 30 tablet, Rfl: 5 .  mometasone-formoterol (DULERA) 200-5 MCG/ACT AERO, Inhale 2 puffs into the lungs 2 (two) times daily., Disp: 1 Inhaler, Rfl: 2 .  montelukast (SINGULAIR) 10 MG tablet, TAKE 1 TABLET BY MOUTH ONCE DAILY IN THE MORNING, Disp: 90 tablet, Rfl: 1 .  Multiple Vitamin (MULTIVITAMIN WITH MINERALS) TABS tablet, Take 1 tablet by mouth daily., Disp: , Rfl:  .  potassium chloride SA (K-DUR,KLOR-CON) 20 MEQ tablet, Take 1 tablet (20 mEq total) by mouth daily.,  Disp: 30 tablet, Rfl: 3 .  predniSONE (DELTASONE) 50 MG tablet, Take one 50 mg tablet once daily for the next five days., Disp: 5 tablet, Rfl: 0 .  Vitamin D, Ergocalciferol, (DRISDOL) 50000 units CAPS capsule, Take 50,000 Units by mouth every Friday. , Disp: , Rfl: 5  Review of Systems  Social History   Tobacco Use  . Smoking status: Former Smoker    Quit date: 03/17/2004    Years since quitting: 14.8  . Smokeless tobacco: Never Used  Substance Use Topics  . Alcohol use: Yes    Comment: occ      Objective:   LMP 09/28/2018  There were no vitals filed for this visit.There is no height or weight on file to calculate BMI.   Physical Exam   No results found for any visits on 02/01/19.     Assessment & [place], PA  [facility]

## 2019-03-21 ENCOUNTER — Ambulatory Visit: Payer: 59 | Attending: Internal Medicine

## 2019-03-21 DIAGNOSIS — Z20822 Contact with and (suspected) exposure to covid-19: Secondary | ICD-10-CM

## 2019-03-23 LAB — NOVEL CORONAVIRUS, NAA: SARS-CoV-2, NAA: NOT DETECTED

## 2019-06-02 ENCOUNTER — Ambulatory Visit
Admission: RE | Admit: 2019-06-02 | Discharge: 2019-06-02 | Disposition: A | Discharge status: Home / Self Care | Payer: Self-pay | Source: Ambulatory Visit | Attending: Physician Assistant | Admitting: Physician Assistant

## 2019-06-02 ENCOUNTER — Ambulatory Visit: Payer: Self-pay | Attending: Internal Medicine

## 2019-06-02 ENCOUNTER — Telehealth: Payer: Self-pay

## 2019-06-02 DIAGNOSIS — Z20822 Contact with and (suspected) exposure to covid-19: Secondary | ICD-10-CM | POA: Insufficient documentation

## 2019-06-02 DIAGNOSIS — Z1231 Encounter for screening mammogram for malignant neoplasm of breast: Secondary | ICD-10-CM | POA: Insufficient documentation

## 2019-06-02 NOTE — Telephone Encounter (Signed)
-----   Message from Mar Daring, [place] sent at 06/02/2019  1:41 PM EDT ----- Normal mammogram. Repeat screening in one year.

## 2019-06-02 NOTE — Telephone Encounter (Signed)
Normal mammogram. Repeat screening in one year.  Written by Mar Daring, PA-C on 06/02/2019 1:41 PM EDT Seen by patient [name] on 06/02/2019 2:14 PM EDT

## 2019-06-04 LAB — NOVEL CORONAVIRUS, NAA: SARS-CoV-2, NAA: NOT DETECTED

## 2019-06-09 ENCOUNTER — Telehealth: Payer: Self-pay | Admitting: Physician Assistant

## 2019-08-05 ENCOUNTER — Other Ambulatory Visit: Payer: Self-pay | Admitting: Physician Assistant

## 2019-08-05 DIAGNOSIS — J301 Allergic rhinitis due to pollen: Secondary | ICD-10-CM

## 2019-08-18 ENCOUNTER — Telehealth: Payer: Self-pay | Admitting: Physician Assistant

## 2019-08-18 NOTE — Telephone Encounter (Signed)
Patient disability forms placed in [place]'s box to be filled out.  Thanks, [employer]

## 2019-08-19 NOTE — Telephone Encounter (Signed)
I have not received any papers.

## 2019-08-25 NOTE — Telephone Encounter (Signed)
Please disregard.  Sent in error.  Thanks, [employer]

## 2019-12-30 ENCOUNTER — Ambulatory Visit: Payer: Self-pay | Admitting: *Deleted

## 2019-12-30 NOTE — Telephone Encounter (Signed)
C/o severe abdominal pain x 1 week. Sharp pains in mid to lower abdomen. Thought she was constipated and took laxatives LBM 10/14 now patient having diarrhea x5 times yesterday. Pain in abd. Now constant and throbbing when sharp pains go away.Patient reports she has felt like she was going to pass out prior to call.  Started as gradual abd. Pain now sharp, stabbing, tender to touch. C/o nausea, headache, frequent urination. Can not stretch out straight feels better in fetal position. Denies vaginal discharge, postmenopausal. Instructed patient to call 911 or  go to ED now and patient does not want to go due to long wait times. Provided emotional support to patient and patient agreeable to go to ED. Wants to tell PCP Anderson Malta she     " loves her, sealed with a kiss". Patient wanted to come to PCP instead. Care advise given. Patient verbalized understanding of care advise and agreed to go to ED and will call 911 if symptoms worsen.   Reason for Disposition . [1] SEVERE pain (e.g., excruciating) AND [2] present > 1 hour  Answer Assessment - Initial Assessment Questions 1. LOCATION: "Where does it hurt?"      Mid to low abdomen 2. RADIATION: "Does the pain shoot anywhere else?" (e.g., chest, back)     Radiates downward and to back 3. ONSET: "When did the pain begin?" (e.g., minutes, hours or days ago)      Approx. 1 week ago  4. SUDDEN: "Gradual or sudden onset?"     Was gradual but now constant throbbing with episodes of sharp pain 5. PATTERN "Does the pain come and go, or is it constant?"    - If constant: "Is it getting better, staying the same, or worsening?"      (Note: Constant means the pain never goes away completely; most serious pain is constant and it progresses)     - If intermittent: "How long does it last?" "Do you have pain now?"     (Note: Intermittent means the pain goes away completely between bouts)     constant 6. SEVERITY: "How bad is the pain?"  (e.g., Scale 1-10; mild,  moderate, or severe)   - MILD (1-3): doesn't interfere with normal activities, abdomen soft and not tender to touch    - MODERATE (4-7): interferes with normal activities or awakens from sleep, tender to touch    - SEVERE (8-10): excruciating pain, doubled over, unable to do any normal activities      severe 7. RECURRENT SYMPTOM: "Have you ever had this type of stomach pain before?" If Yes, ask: "When was the last time?" and "What happened that time?"      No  8. CAUSE: "What do you think is causing the stomach pain?"     Not sure just had 2 cousins die from ovarian CA 9. RELIEVING/AGGRAVATING FACTORS: "What makes it better or worse?" (e.g., movement, antacids, bowel movement)     Nothing makes it better 10. OTHER SYMPTOMS: "Has there been any vomiting, diarrhea, constipation, or urine problems?"       Diarrhea, headache, frequent urination, 1 episode felt like she could pass out  11. PREGNANCY: "Is there any chance you are pregnant?" "When was your last menstrual period?"       postmenapausal  Protocols used: ABDOMINAL PAIN - [facility]

## 2020-02-08 ENCOUNTER — Other Ambulatory Visit: Payer: Self-pay | Admitting: Physician Assistant

## 2020-02-08 DIAGNOSIS — J301 Allergic rhinitis due to pollen: Secondary | ICD-10-CM

## 2020-03-27 ENCOUNTER — Other Ambulatory Visit: Payer: Self-pay | Admitting: Physician Assistant

## 2020-03-27 DIAGNOSIS — J301 Allergic rhinitis due to pollen: Secondary | ICD-10-CM

## 2020-04-02 NOTE — Progress Notes (Deleted)
Complete physical exam   Patient: Megan Jimenez   DOB: 01/26/1966   55 y.o. Female  MRN: [id number] Visit Date: 04/04/2020  Today's healthcare provider: Mar Daring, PA-C   No chief complaint on file.  Subjective    Megan Jimenez is a 55 y.o. female who presents today for a complete physical exam.  She reports consuming a {diet types:17450} diet. {Exercise:19826} She generally feels {well/fairly well/poorly:18703}. She reports sleeping {well/fairly well/poorly:18703}. She {does/does not:200015} have additional problems to discuss today.  HPI  04/26/18-Pap is normal and negative for HPV. No need to recheck for 3-5 years. 06/02/19- Normal Mammogram  Past Medical History:  Diagnosis Date  . Asthma   . Bipolar 1 disorder [facility])    Past Surgical History:  Procedure Laterality Date  . ANKLE FRACTURE SURGERY Right 1992  . CHOLECYSTECTOMY     Social History   Socioeconomic History  . Marital status: Married    Spouse name: Not on file  . Number of children: 2  . Years of education: College  . Highest education level: Not on file  Occupational History    Comment: Full-time  Tobacco Use  . Smoking status: Former Smoker    Quit date: 03/17/2004    Years since quitting: 16.0  . Smokeless tobacco: Never Used  Substance and Sexual Activity  . Alcohol use: Yes    Comment: occ  . Drug use: No  . Sexual activity: Not on file  Other Topics Concern  . Not on file  Social History Narrative   Lives at home with husband   Social Determinants of Health   Financial Resource Strain: Not on file  Food Insecurity: Not on file  Transportation Needs: Not on file  Physical Activity: Not on file  Stress: Not on file  Social Connections: Not on file  Intimate Partner Violence: Not on file   Family Status  Relation Name Status  . Mother  Alive  . Father  Deceased at age 44       LEUKEMIA  . Sister  Alive  . Sister  Alive  . Mat Aunt  (Not Specified)  . Cousin   (Not Specified)   Family History  Problem Relation Age of Onset  . COPD Mother   . Hyperlipidemia Mother   . Cancer Mother        Lung cancer  . Hyperlipidemia Father   . Leukemia Father   . COPD Father   . Depression Sister        ANXIETY  . Healthy Sister   . Breast cancer Maternal Aunt        2 mat aunts  . Breast cancer Cousin        pat cousin   No Known Allergies  Patient Care Team: Mar Daring, PA-C as PCP - General (Family Medicine)   Medications: Outpatient Medications Prior to Visit  Medication Sig  . albuterol (PROVENTIL HFA;VENTOLIN HFA) 108 (90 Base) MCG/ACT inhaler Inhale 2 puffs into the lungs every 6 (six) hours as needed for wheezing or shortness of breath.  . ALPRAZolam (XANAX) 1 MG tablet Take 1 mg by mouth 3 (three) times daily as needed for anxiety.  . ferrous sulfate 325 (65 FE) MG tablet Take 325 mg by mouth daily with breakfast.  . fluticasone (FLONASE) 50 MCG/ACT nasal spray Place 2 sprays into both nostrils daily.  . fluticasone (FLOVENT HFA) 110 MCG/ACT inhaler Inhale 2 puffs into the lungs daily.  . furosemide (  LASIX) 20 MG tablet Take 1 tablet (20 mg total) by mouth daily.  [name] ipratropium-albuterol (DUONEB) 0.5-2.5 (3) MG/3ML SOLN Take 3 mLs by nebulization every 6 (six) hours as needed.  . loratadine (CLARITIN) 10 MG tablet Take 10 mg by mouth daily as needed for allergies.  . magnesium oxide (MAG-OX) 400 MG tablet Take 400 mg by mouth every evening.  . meloxicam (MOBIC) 15 MG tablet Take 1 tablet (15 mg total) by mouth daily.  . mometasone-formoterol (DULERA) 200-5 MCG/ACT AERO Inhale 2 puffs into the lungs 2 (two) times daily.  . montelukast (SINGULAIR) 10 MG tablet TAKE 1 TABLET BY MOUTH EVERY MORNING  . Multiple Vitamin (MULTIVITAMIN WITH MINERALS) TABS tablet Take 1 tablet by mouth daily.  . potassium chloride SA (K-DUR,KLOR-CON) 20 MEQ tablet Take 1 tablet (20 mEq total) by mouth daily.  . predniSONE (DELTASONE) 50 MG tablet Take one  50 mg tablet once daily for the next five days.  . Vitamin D, Ergocalciferol, (DRISDOL) 50000 units CAPS capsule Take 50,000 Units by mouth every Friday.    No facility-administered medications prior to visit.    Review of Systems  {Labs  Heme  Chem  Endocrine  Serology  Results Review (optional):23779::" "}  Objective    LMP 09/28/2018  {Show previous vital signs (optional):23777::" "}  Physical Exam  ***  Last depression screening scores PHQ 2/9 Scores 04/26/2018  PHQ - 2 Score 2  PHQ- 9 Score 7   Last fall risk screening No flowsheet data found. Last Audit-C alcohol use screening Alcohol Use Disorder Test (AUDIT) 04/26/2018  1. How often do you have a drink containing alcohol? 4  2. How many drinks containing alcohol do you have on a typical day when you are drinking? 1  3. How often do you have six or more drinks on one occasion? 2  AUDIT-C Score 7   A score of 3 or more in women, and 4 or more in men indicates increased risk for alcohol abuse, EXCEPT if all of the points are from question 1   No results found for any visits on 04/04/20.  Assessment & Plan    Routine Health Maintenance and Physical Exam  Exercise Activities and Dietary recommendations Goals   None     Immunization History  Administered Date(s) Administered  . Influenza,inj,Quad PF,6+ Mos 03/28/2018  . Pneumococcal Polysaccharide-23 10/25/2015  . Td 05/30/1997  . Tdap 10/25/2015    Health Maintenance  Topic Date Due  . Hepatitis C Screening  Never done  . COVID-19 Vaccine (1) Never done  . HIV Screening  Never done  . COLONOSCOPY (Pts 45-33yrs Insurance coverage will need to be confirmed)  Never done  . INFLUENZA VACCINE  10/16/2019  . PAP SMEAR-Modifier  04/26/2021  . MAMMOGRAM  06/01/2021  . TETANUS/TDAP  10/24/2025    Discussed health benefits of physical activity, and encouraged her to engage in regular exercise appropriate for her age and condition.  ***  No follow-ups on  file.     {provider attestation***:1}   [place]ye [place]  [facility] [phone] (phone) [phone] (fax)  [place]

## 2020-04-04 ENCOUNTER — Encounter: Payer: Self-pay | Admitting: Physician Assistant

## 2020-04-18 ENCOUNTER — Telehealth: Payer: Self-pay | Admitting: Physician Assistant

## 2020-04-18 ENCOUNTER — Other Ambulatory Visit: Payer: Self-pay | Admitting: Physician Assistant

## 2020-04-18 DIAGNOSIS — J301 Allergic rhinitis due to pollen: Secondary | ICD-10-CM

## 2020-04-18 MED ORDER — BINAXNOW COVID-19 AG HOME TEST VI KIT: PACK | 0 refills | Status: DC

## 2020-04-18 NOTE — Telephone Encounter (Signed)
Future visit in 1 week . courtesy refill

## 2020-04-18 NOTE — Telephone Encounter (Signed)
Covid test sent to [facility]

## 2020-04-18 NOTE — Telephone Encounter (Signed)
Pt called in for assistance. Pt says that she was told by her insurance and pharmacy to request a Rx for a rapid covid test. Pt says that her insurance allots up to 8 a month. Pt would like to take a test before returning to work.      [facility]  [id number] [place], [place] - [address] Phone:  [phone]  Fax:  [phone]        [phone]

## 2020-04-18 NOTE — Telephone Encounter (Signed)
Pt advised.   Thanks,   -Tyia Binford  

## 2020-04-23 ENCOUNTER — Encounter: Payer: Self-pay | Admitting: Physician Assistant

## 2020-04-26 ENCOUNTER — Ambulatory Visit (INDEPENDENT_AMBULATORY_CARE_PROVIDER_SITE_OTHER): Payer: BC Managed Care – PPO | Admitting: Physician Assistant

## 2020-04-26 ENCOUNTER — Encounter: Payer: Self-pay | Admitting: Physician Assistant

## 2020-04-26 ENCOUNTER — Other Ambulatory Visit: Payer: Self-pay

## 2020-04-26 VITALS — BP 148/90 | HR 87 | Temp 98.5°F | Resp 20 | Ht 69.0 in | Wt 279.7 lb

## 2020-04-26 DIAGNOSIS — J4541 Moderate persistent asthma with (acute) exacerbation: Secondary | ICD-10-CM

## 2020-04-26 DIAGNOSIS — Z6841 Body Mass Index (BMI) 40.0 and over, adult: Secondary | ICD-10-CM | POA: Diagnosis not present

## 2020-04-26 DIAGNOSIS — F3176 Bipolar disorder, in full remission, most recent episode depressed: Secondary | ICD-10-CM | POA: Diagnosis not present

## 2020-04-26 DIAGNOSIS — Z1239 Encounter for other screening for malignant neoplasm of breast: Secondary | ICD-10-CM

## 2020-04-26 DIAGNOSIS — G479 Sleep disorder, unspecified: Secondary | ICD-10-CM

## 2020-04-26 DIAGNOSIS — Z1211 Encounter for screening for malignant neoplasm of colon: Secondary | ICD-10-CM

## 2020-04-26 DIAGNOSIS — F4321 Adjustment disorder with depressed mood: Secondary | ICD-10-CM

## 2020-04-26 DIAGNOSIS — Z0000 Encounter for general adult medical examination without abnormal findings: Secondary | ICD-10-CM | POA: Diagnosis not present

## 2020-04-26 MED ORDER — BREO ELLIPTA 200-25 MCG/INH IN AEPB: 1.0000 | INHALATION_SPRAY | Freq: Every day | RESPIRATORY_TRACT | 5 refills | Status: DC

## 2020-04-26 MED ORDER — ESZOPICLONE 3 MG PO TABS: 3.0000 mg | ORAL_TABLET | Freq: Every day | ORAL | 1 refills | Status: DC

## 2020-04-26 NOTE — Progress Notes (Signed)
Complete physical exam   Patient: Megan Jimenez   DOB: [birth year]   55 y.o. Female  MRN: [id number] Visit Date: 04/26/2020  Today's healthcare provider: Mar Daring, PA-C   Chief Complaint  Patient presents with  . Annual Exam  . Fatigue  . Asthma   Subjective    Megan Jimenez is a 55 y.o. female who presents today for a complete physical exam.  She reports consuming a general diet. The patient does not participate in regular exercise at present. She generally feels poorly. She reports sleeping poorly. She does have additional problems to discuss today.  Patient C/O not feeling well in the last 6 months. She reports fatigue, headache, back pain, not being able to sleep. Patient reports symptoms are present daily. Patient reports asthma flaring up more often, having shortness of breath and wheezing.  HPI  04/26/2018 CPE 04/26/2018 Pap-negative 04/26/2018 HPV-negative 06/02/2019 Mammogram-BI-RADS 1   Past Medical History:  Diagnosis Date  . Asthma   . Bipolar 1 disorder [facility])    Past Surgical History:  Procedure Laterality Date  . ANKLE FRACTURE SURGERY Right 1992  . CHOLECYSTECTOMY     Social History   Socioeconomic History  . Marital status: Married    Spouse name: Not on file  . Number of children: 2  . Years of education: College  . Highest education level: Not on file  Occupational History    Comment: Full-time  Tobacco Use  . Smoking status: Former Smoker    Quit date: 03/17/2004    Years since quitting: 16.1  . Smokeless tobacco: Never Used  Vaping Use  . Vaping Use: Never used  Substance and Sexual Activity  . Alcohol use: Yes    Comment: occ  . Drug use: No  . Sexual activity: Not on file  Other Topics Concern  . Not on file  Social History Narrative   Lives at home with husband   Social Determinants of Health   Financial Resource Strain: Not on file  Food Insecurity: Not on file  Transportation Needs: Not on file  Physical  Activity: Not on file  Stress: Not on file  Social Connections: Not on file  Intimate Partner Violence: Not on file   Family Status  Relation Name Status  . Mother  Deceased at age 47  . Father  Deceased at age 45       LEUKEMIA  . Sister  Alive  . Sister  Alive  . Mat Aunt  (Not Specified)  . Cousin  (Not Specified)   Family History  Problem Relation Age of Onset  . COPD Mother   . Hyperlipidemia Mother   . Cancer Mother        Lung cancer  . Hyperlipidemia Father   . Leukemia Father   . COPD Father   . Depression Sister        ANXIETY  . Healthy Sister   . Breast cancer Maternal Aunt        2 mat aunts  . Breast cancer Cousin        pat cousin   No Known Allergies  Patient Care Team: Mar Daring, PA-C as PCP - General (Family Medicine)   Medications: Outpatient Medications Prior to Visit  Medication Sig  . albuterol (PROVENTIL HFA;VENTOLIN HFA) 108 (90 Base) MCG/ACT inhaler Inhale 2 puffs into the lungs every 6 (six) hours as needed for wheezing or shortness of breath.  . ALPRAZolam (XANAX) 1 MG tablet  Take 1 mg by mouth 3 (three) times daily as needed for anxiety.  . ferrous sulfate 325 (65 FE) MG tablet Take 325 mg by mouth daily with breakfast.  . fluticasone (FLONASE) 50 MCG/ACT nasal spray Place 2 sprays into both nostrils daily.  [name] ipratropium-albuterol (DUONEB) 0.5-2.5 (3) MG/3ML SOLN Take 3 mLs by nebulization every 6 (six) hours as needed.  . loratadine (CLARITIN) 10 MG tablet Take 10 mg by mouth daily as needed for allergies.  . Lurasidone HCl (LATUDA) 120 MG TABS Take by mouth.  . magnesium oxide (MAG-OX) 400 MG tablet Take 400 mg by mouth every evening.  . montelukast (SINGULAIR) 10 MG tablet TAKE 1 TABLET BY MOUTH EVERY DAY IN THE MORNING  . Multiple Vitamin (MULTIVITAMIN WITH MINERALS) TABS tablet Take 1 tablet by mouth daily.  . Vitamin D, Ergocalciferol, (DRISDOL) 50000 units CAPS capsule Take 50,000 Units by mouth every Friday.   .  [DISCONTINUED] fluticasone (FLOVENT HFA) 110 MCG/ACT inhaler Inhale 2 puffs into the lungs daily.  . [DISCONTINUED] COVID-19 At Home Antigen Test [facility] COVID-19 AG HOME TEST) KIT Use as directed  . [DISCONTINUED] furosemide (LASIX) 20 MG tablet Take 1 tablet (20 mg total) by mouth daily.  . [DISCONTINUED] meloxicam (MOBIC) 15 MG tablet Take 1 tablet (15 mg total) by mouth daily.  . [DISCONTINUED] mometasone-formoterol (DULERA) 200-5 MCG/ACT AERO Inhale 2 puffs into the lungs 2 (two) times daily.  . [DISCONTINUED] potassium chloride SA (K-DUR,KLOR-CON) 20 MEQ tablet Take 1 tablet (20 mEq total) by mouth daily.  . [DISCONTINUED] predniSONE (DELTASONE) 50 MG tablet Take one 50 mg tablet once daily for the next five days.   No facility-administered medications prior to visit.    Review of Systems  Constitutional: Positive for activity change, diaphoresis and fatigue.  HENT: Positive for congestion and rhinorrhea.   Eyes: Positive for visual disturbance.  Respiratory: Positive for chest tightness, shortness of breath and wheezing.   Cardiovascular: Positive for chest pain and palpitations.  Gastrointestinal: Positive for abdominal distention, nausea and vomiting.  Endocrine: Negative.   Genitourinary: Positive for urgency.  Musculoskeletal: Positive for arthralgias, back pain, myalgias, neck pain and neck stiffness.  Skin: Negative.   Allergic/Immunologic: Negative.   Neurological: Positive for dizziness, tremors, weakness, numbness and headaches.  Hematological: Bruises/bleeds easily.  Psychiatric/Behavioral: Positive for agitation, confusion, decreased concentration and sleep disturbance. The patient is nervous/anxious.     Last CBC Lab Results  Component Value Date   WBC 4.8 01/21/2019   HGB 14.0 01/21/2019   HCT 44.6 01/21/2019   MCV 97.6 01/21/2019   MCH 30.6 01/21/2019   RDW 12.9 01/21/2019   PLT 209 33/29/5188   Last metabolic panel Lab Results  Component Value Date    GLUCOSE 128 (H) 01/21/2019   NA 141 01/21/2019   K 3.5 01/21/2019   CL 104 01/21/2019   CO2 29 01/21/2019   BUN 14 01/21/2019   CREATININE 0.49 01/21/2019   GFRNONAA >60 01/21/2019   GFRAA >60 01/21/2019   CALCIUM 8.8 (L) 01/21/2019   PROT 6.4 (L) 01/21/2019   ALBUMIN 3.8 01/21/2019   LABGLOB 1.9 04/28/2018   AGRATIO 2.3 (H) 04/28/2018   BILITOT 0.3 01/21/2019   ALKPHOS 54 01/21/2019   AST 30 01/21/2019   ALT 38 01/21/2019   ANIONGAP 8 01/21/2019   Last lipids Lab Results  Component Value Date   CHOL 230 (H) 04/28/2018   HDL 74 04/28/2018   LDLCALC 119 (H) 04/28/2018   TRIG 185 (H) 04/28/2018  CHOLHDL 3.1 04/28/2018   Last hemoglobin A1c Lab Results  Component Value Date   HGBA1C 5.4 04/28/2018   Last thyroid functions Lab Results  Component Value Date   TSH 1.540 04/28/2018   Last vitamin D No results found for: 25OHVITD2, 25OHVITD3, VD25OH Last vitamin B12 and Folate No results found for: VITAMINB12, FOLATE    Objective    BP (!) 148/90 (BP Location: Right Arm, Patient Position: Sitting, Cuff Size: Large)   Pulse 87   Temp 98.5 F (36.9 C) (Oral)   Resp 20   Ht _0  (1.753 m)   Wt 279 lb 11.2 oz (126.9 kg)   LMP 09/28/2018   SpO2 94%   BMI 41.30 kg/m  BP Readings from Last 3 Encounters:  04/26/20 (!) 148/90  01/21/19 135/77  11/26/18 138/89   Wt Readings from Last 3 Encounters:  04/26/20 279 lb 11.2 oz (126.9 kg)  01/21/19 284 lb (128.8 kg)  11/26/18 284 lb 6.4 oz (129 kg)      Physical Exam Vitals reviewed.  Constitutional:      General: She is not in acute distress.    Appearance: Normal appearance. She is well-developed and well-nourished. She is obese. She is not ill-appearing or diaphoretic.  HENT:     Head: Normocephalic and atraumatic.     Right Ear: Tympanic membrane, ear canal and external ear normal.     Left Ear: Tympanic membrane, ear canal and external ear normal.     Mouth/Throat:     Mouth: Oropharynx is clear and  moist.     Pharynx: No oropharyngeal exudate.  Eyes:     General: No scleral icterus.       Right eye: No discharge.        Left eye: No discharge.     Extraocular Movements: Extraocular movements intact and EOM normal.     Conjunctiva/sclera: Conjunctivae normal.     Pupils: Pupils are equal, round, and reactive to light.  Neck:     Thyroid: No thyromegaly.     Vascular: No carotid bruit or JVD.     Trachea: No tracheal deviation.  Cardiovascular:     Rate and Rhythm: Normal rate and regular rhythm.     Pulses: Normal pulses and intact distal pulses.     Heart sounds: Normal heart sounds. No murmur heard. No friction rub. No gallop.   Pulmonary:     Effort: Pulmonary effort is normal. No respiratory distress.     Breath sounds: Wheezing (throughout) present. No rales.  Chest:     Chest wall: No tenderness.  Abdominal:     General: Bowel sounds are normal. There is no distension.     Palpations: Abdomen is soft. There is no mass.     Tenderness: There is no abdominal tenderness. There is no guarding or rebound.  Musculoskeletal:        General: No tenderness or edema. Normal range of motion.     Cervical back: Normal range of motion and neck supple. No tenderness.     Right lower leg: No edema.     Left lower leg: No edema.  Lymphadenopathy:     Cervical: No cervical adenopathy.  Skin:    General: Skin is warm and dry.     Capillary Refill: Capillary refill takes less than 2 seconds.     Findings: No rash.  Neurological:     General: No focal deficit present.     Mental Status: She is alert and oriented  to person, place, and time. Mental status is at baseline.  Psychiatric:        Mood and Affect: Mood and affect and mood normal.        Behavior: Behavior normal.        Thought Content: Thought content normal.        Judgment: Judgment normal.     Last depression screening scores PHQ 2/9 Scores 04/26/2020 04/26/2018  PHQ - 2 Score 6 2  PHQ- 9 Score 25 7   Last fall  risk screening No flowsheet data found. Last Audit-C alcohol use screening Alcohol Use Disorder Test (AUDIT) 04/26/2020  1. How often do you have a drink containing alcohol? 4  2. How many drinks containing alcohol do you have on a typical day when you are drinking? 1  3. How often do you have six or more drinks on one occasion? 2  AUDIT-C Score 7   A score of 3 or more in women, and 4 or more in men indicates increased risk for alcohol abuse, EXCEPT if all of the points are from question 1   No results found for any visits on 04/26/20.  Assessment & Plan    Routine Health Maintenance and Physical Exam  Exercise Activities and Dietary recommendations Goals   None     Immunization History  Administered Date(s) Administered  . Influenza,inj,Quad PF,6+ Mos 03/28/2018, 02/27/2019  . Moderna Sars-Covid-2 Vaccination 11/28/2019, 12/26/2019  . Pneumococcal Polysaccharide-23 10/25/2015  . Td 05/30/1997  . Tdap 10/25/2015    Health Maintenance  Topic Date Due  . Hepatitis C Screening  Never done  . HIV Screening  Never done  . COLONOSCOPY (Pts 45-46yr Insurance coverage will need to be confirmed)  Never done  . INFLUENZA VACCINE  06/14/2020 (Originally 10/16/2019)  . COVID-19 Vaccine (3 - Booster for Moderna series) 06/25/2020  . PAP SMEAR-Modifier  04/26/2021  . MAMMOGRAM  06/01/2021  . TETANUS/TDAP  10/24/2025    Discussed health benefits of physical activity, and encouraged her to engage in regular exercise appropriate for her age and condition.  1. Annual physical exam Normal physical exam today. Will check labs as below and f/u pending lab results. If labs are stable and WNL she will not need to have these rechecked for one year at her next annual physical exam. She is to call the office in the meantime if she has any acute issue, questions or concerns.  2. Class 3 severe obesity due to excess calories with serious comorbidity and body mass index (BMI) of 40.0 to 44.9 in  adult ([facility] Counseled patient on healthy lifestyle modifications including dieting and exercise.  Will check labs as below and f/u pending results. - CBC w/Diff/Platelet - Comprehensive Metabolic Panel (CMET) - TSH - Lipid Panel With LDL/HDL Ratio - HgB A1c  3. Bipolar disorder, in full remission, most recent episode depressed ([place] Stable. Will check labs as below and f/u pending results. - CBC w/Diff/Platelet - Comprehensive Metabolic Panel (CMET) - Vitamin D (25 hydroxy) - B12 and Folate Panel  4. Moderate persistent asthma with acute exacerbation Worsening. Will add Breo. Continue albuterol prn for rescue. F/U in 4 weeks.  - fluticasone furoate-vilanterol (BREO ELLIPTA) 200-25 MCG/INH AEPB; Inhale 1 puff into the lungs daily.  Dispense: 60 each; Refill: 5  5. Grief Mother passed in April 2021 from lung cancer. Overall doing well.   6. Encounter for breast cancer screening using non-mammogram modality Breast exam today was normal. There is no family history  of breast cancer. She does perform regular self breast exams. Mammogram was ordered as below. Information for [facility] Breast clinic was given to patient so she may schedule her mammogram at her convenience. - MM 3D SCREEN BREAST BILATERAL; Future  7. Difficulty sleeping Worsening issue over the last few months. Possibly secondary to sleep apnea. Will start Lunesta and f/u in 4 weeks to see if improving and consider ordering a sleep study.  - Eszopiclone 3 MG TABS; Take 1 tablet (3 mg total) by mouth at bedtime. Take immediately before bedtime  Dispense: 30 tablet; Refill: 1  8. Colon cancer screening Unable to address at [place] today. Will address at visit for follow up in 4 weeks.    Return in about 4 weeks (around 05/24/2020) for Sleep, asthma and colon cancer screen.     Reynolds Bowl, PA-C, have reviewed all documentation for this visit. The documentation on 04/26/20 for the exam, diagnosis, procedures, and orders  are all accurate and complete.   Rubye Beach  [facility] [phone] (phone) [phone] (fax)  [place]

## 2020-04-26 NOTE — Patient Instructions (Addendum)
[facility] [address] [place],  [place]  [address] Main: [phone]   Preventive Care 40-56 Years Old, Female Preventive care refers to lifestyle choices and visits with your health care provider that can promote health and wellness. This includes:  A yearly physical exam. This is also called an annual wellness visit.  Regular dental and eye exams.  Immunizations.  Screening for certain conditions.  Healthy lifestyle choices, such as: ? Eating a healthy diet. ? Getting regular exercise. ? Not using drugs or products that contain nicotine and tobacco. ? Limiting alcohol use. What can I expect for my preventive care visit? Physical exam Your health care provider will check your:  Height and weight. These may be used to calculate your BMI (body mass index). BMI is a measurement that tells if you are at a healthy weight.  Heart rate and blood pressure.  Body temperature.  Skin for abnormal spots. Counseling Your health care provider may ask you questions about your:  Past medical problems.  Family's medical history.  Alcohol, tobacco, and drug use.  Emotional well-being.  Home life and relationship well-being.  Sexual activity.  Diet, exercise, and sleep habits.  Work and work environment.  Access to firearms.  Method of birth control.  Menstrual cycle.  Pregnancy history. What immunizations do I need? Vaccines are usually given at various ages, according to a schedule. Your health care provider will recommend vaccines for you based on your age, medical history, and lifestyle or other factors, such as travel or where you work.   What tests do I need? Blood tests  Lipid and cholesterol levels. These may be checked every 5 years, or more often if you are over 50 years old.  Hepatitis C test.  Hepatitis B test. Screening  Lung cancer screening. You may have this screening every year starting at age 56 if you  have a 30-pack-year history of smoking and currently smoke or have quit within the past 15 years.  Colorectal cancer screening. ? All adults should have this screening starting at age 56 and continuing until age 75. ? Your health care provider may recommend screening at age 45 if you are at increased risk. ? You will have tests every 1-10 years, depending on your results and the type of screening test.  Diabetes screening. ? This is done by checking your blood sugar (glucose) after you have not eaten for a while (fasting). ? You may have this done every 1-3 years.  Mammogram. ? This may be done every 1-2 years. ? Talk with your health care provider about when you should start having regular mammograms. This may depend on whether you have a family history of breast cancer.  BRCA-related cancer screening. This may be done if you have a family history of breast, ovarian, tubal, or peritoneal cancers.  Pelvic exam and Pap test. ? This may be done every 3 years starting at age 21. ? Starting at age 30, this may be done every 5 years if you have a Pap test in combination with an HPV test. Other tests  STD (sexually transmitted disease) testing, if you are at risk.  Bone density scan. This is done to screen for osteoporosis. You may have this scan if you are at high risk for osteoporosis. Talk with your health care provider about your test results, treatment options, and if necessary, the need for more tests. Follow these instructions at home: Eating and drinking  Eat a diet   that includes fresh fruits and vegetables, whole grains, lean protein, and low-fat dairy products.  Take vitamin and mineral supplements as recommended by your health care provider.  Do not drink alcohol if: ? Your health care provider tells you not to drink. ? You are pregnant, may be pregnant, or are planning to become pregnant.  If you drink alcohol: ? Limit how much you have to 0-1 drink a day. ? Be aware of  how much alcohol is in your drink. In the [place] one drink equals one 12 oz bottle of beer (355 mL), one 5 oz glass of wine (148 mL), or one 1 oz glass of hard liquor (44 mL).   Lifestyle  Take daily care of your teeth and gums. Brush your teeth every morning and night with fluoride toothpaste. Floss one time each day.  Stay active. Exercise for at least 30 minutes 5 or more days each week.  Do not use any products that contain nicotine or tobacco, such as cigarettes, e-cigarettes, and chewing tobacco. If you need help quitting, ask your health care provider.  Do not use drugs.  If you are sexually active, practice safe sex. Use a condom or other form of protection to prevent STIs (sexually transmitted infections).  If you do not wish to become pregnant, use a form of birth control. If you plan to become pregnant, see your health care provider for a prepregnancy visit.  If told by your health care provider, take low-dose aspirin daily starting at age 56.  Find healthy ways to cope with stress, such as: ? Meditation, yoga, or listening to music. ? Journaling. ? Talking to a trusted person. ? Spending time with friends and family. Safety  Always wear your seat belt while driving or riding in a vehicle.  Do not drive: ? If you have been drinking alcohol. Do not ride with someone who has been drinking. ? When you are tired or distracted. ? While texting.  Wear a helmet and other protective equipment during sports activities.  If you have firearms in your house, make sure you follow all gun safety procedures. What's next?  Visit your health care provider once a year for an annual wellness visit.  Ask your health care provider how often you should have your eyes and teeth checked.  Stay up to date on all vaccines. This information is not intended to replace advice given to you by your health care provider. Make sure you discuss any questions you have with your health care  provider. Document Revised: 12/06/2019 Document Reviewed: 11/12/2017 Elsevier Patient Education  2021 [employer]  

## 2020-04-27 LAB — COMPREHENSIVE METABOLIC PANEL
ALT: 16 IU/L (ref 0–32)
AST: 13 IU/L (ref 0–40)
Albumin/Globulin Ratio: 1.9 (ref 1.2–2.2)
Albumin: 3.7 g/dL — ABNORMAL LOW (ref 3.8–4.9)
Alkaline Phosphatase: 64 IU/L (ref 44–121)
BUN/Creatinine Ratio: 30 — ABNORMAL HIGH (ref 9–23)
BUN: 18 mg/dL (ref 6–24)
Bilirubin Total: 0.3 mg/dL (ref 0.0–1.2)
CO2: 27 mmol/L (ref 20–29)
Calcium: 9 mg/dL (ref 8.7–10.2)
Chloride: 102 mmol/L (ref 96–106)
Creatinine, Ser: 0.6 mg/dL (ref 0.57–1.00)
GFR calc Af Amer: 119 mL/min/{1.73_m2} (ref >59)
GFR calc non Af Amer: 103 mL/min/{1.73_m2} (ref >59)
Globulin, Total: 2 g/dL (ref 1.5–4.5)
Glucose: 96 mg/dL (ref 65–99)
Potassium: 4.3 mmol/L (ref 3.5–5.2)
Sodium: 140 mmol/L (ref 134–144)
Total Protein: 5.7 g/dL — ABNORMAL LOW (ref 6.0–8.5)

## 2020-04-27 LAB — CBC WITH DIFFERENTIAL/PLATELET
Basophils Absolute: 0.1 10*3/uL (ref 0.0–0.2)
Basos: 1 %
EOS (ABSOLUTE): 0.2 10*3/uL (ref 0.0–0.4)
Eos: 3 %
Hematocrit: 40.4 % (ref 34.0–46.6)
Hemoglobin: 13.4 g/dL (ref 11.1–15.9)
Immature Grans (Abs): 0 10*3/uL (ref 0.0–0.1)
Immature Granulocytes: 0 %
Lymphocytes Absolute: 1.7 10*3/uL (ref 0.7–3.1)
Lymphs: 24 %
MCH: 30.3 pg (ref 26.6–33.0)
MCHC: 33.2 g/dL (ref 31.5–35.7)
MCV: 91 fL (ref 79–97)
Monocytes Absolute: 0.6 10*3/uL (ref 0.1–0.9)
Monocytes: 8 %
Neutrophils Absolute: 4.6 10*3/uL (ref 1.4–7.0)
Neutrophils: 64 %
Platelets: 219 10*3/uL (ref 150–450)
RBC: 4.42 x10E6/uL (ref 3.77–5.28)
RDW: 13.5 % (ref 11.7–15.4)
WBC: 7.2 10*3/uL (ref 3.4–10.8)

## 2020-04-27 LAB — B12 AND FOLATE PANEL
Folate: 16.7 ng/mL (ref >3.0)
Vitamin B-12: 404 pg/mL (ref 232–1245)

## 2020-04-27 LAB — LIPID PANEL WITH LDL/HDL RATIO
Cholesterol, Total: 208 mg/dL — ABNORMAL HIGH (ref 100–199)
HDL: 67 mg/dL (ref >39)
LDL Chol Calc (NIH): 117 mg/dL — ABNORMAL HIGH (ref 0–99)
LDL/HDL Ratio: 1.7 ratio (ref 0.0–3.2)
Triglycerides: 140 mg/dL (ref 0–149)
VLDL Cholesterol Cal: 24 mg/dL (ref 5–40)

## 2020-04-27 LAB — HEMOGLOBIN A1C
Est. average glucose Bld gHb Est-mCnc: 117 mg/dL
Hgb A1c MFr Bld: 5.7 % — ABNORMAL HIGH (ref 4.8–5.6)

## 2020-04-27 LAB — TSH: TSH: 1.25 u[IU]/mL (ref 0.450–4.500)

## 2020-04-27 LAB — VITAMIN D 25 HYDROXY (VIT D DEFICIENCY, FRACTURES): Vit D, 25-Hydroxy: 31 ng/mL (ref 30.0–100.0)

## 2020-05-16 ENCOUNTER — Other Ambulatory Visit: Payer: Self-pay | Admitting: Physician Assistant

## 2020-05-16 DIAGNOSIS — J301 Allergic rhinitis due to pollen: Secondary | ICD-10-CM

## 2020-05-16 NOTE — Telephone Encounter (Signed)
Courtesy refill given until appointment 05/24/20.

## 2020-05-24 ENCOUNTER — Encounter: Payer: Self-pay | Admitting: Physician Assistant

## 2020-05-24 ENCOUNTER — Other Ambulatory Visit: Payer: Self-pay

## 2020-05-24 ENCOUNTER — Ambulatory Visit: Payer: BC Managed Care – PPO | Admitting: Physician Assistant

## 2020-05-24 VITALS — BP 157/98 | HR 82 | Temp 98.7°F | Wt 272.0 lb

## 2020-05-24 DIAGNOSIS — Z1211 Encounter for screening for malignant neoplasm of colon: Secondary | ICD-10-CM | POA: Diagnosis not present

## 2020-05-24 DIAGNOSIS — G479 Sleep disorder, unspecified: Secondary | ICD-10-CM | POA: Diagnosis not present

## 2020-05-24 DIAGNOSIS — J4541 Moderate persistent asthma with (acute) exacerbation: Secondary | ICD-10-CM

## 2020-05-24 DIAGNOSIS — F3132 Bipolar disorder, current episode depressed, moderate: Secondary | ICD-10-CM

## 2020-05-24 MED ORDER — ESZOPICLONE 3 MG PO TABS
3.0000 mg | ORAL_TABLET | Freq: Every day | ORAL | 1 refills | Status: DC
Start: 2020-05-24 — End: 2020-12-11

## 2020-05-24 MED ORDER — BREO ELLIPTA 200-25 MCG/INH IN AEPB: 1.0000 | INHALATION_SPRAY | Freq: Every day | RESPIRATORY_TRACT | 5 refills | Status: DC

## 2020-05-24 NOTE — Patient Instructions (Signed)
Trintellix/Vortioxetine oral tablet What is this medicine? Vortioxetine (vor tee [employer] e teen) is used to treat depression. This medicine may be used for other purposes; ask your health care provider or pharmacist if you have questions. COMMON BRAND NAME(S): BRINTELLIX, Trintellix What should I tell my health care provider before I take this medicine? They need to know if you have any of these conditions:  bipolar disorder or a family history of bipolar disorder  bleeding disorder  glaucoma  liver disease  low levels of sodium in the blood  seizures  suicidal thoughts, plans, or attempt; a previous suicide attempt by you or a family member  take medicines that treat or prevent blood clots  taken an MAOI such as Marplan, Nardil, or Parnate in the last 14 days  an unusual or allergic reaction to vortioxetine, other medicines, foods, dyes, or preservatives  pregnant or trying to get pregnant  breast-feeding How should I use this medicine? Take this medicine by mouth with water. Take it as directed on the prescription label at the same time every day. You can take it with or without food. If it upsets your stomach, take it with food. Keep taking this medicine unless your health care provider tells you to stop. Stopping it too quickly can cause serious side effects. It can also make your condition worse. A special MedGuide will be given to you by the pharmacist with each prescription and refill. Be sure to read this information carefully each time. Talk to your pediatrician regarding the use of this medicine in children. It is not approved for use in children. Overdosage: If you think you have taken too much of this medicine contact a poison control center or emergency room at once. NOTE: This medicine is only for you. Do not share this medicine with others. What if I miss a dose? If you miss a dose, take it as soon as you can. If it is almost time for your next dose, take only that  dose. Do not take double or extra doses. What may interact with this medicine? Do not take this medicine with any of the following medications:  linezolid  MAOIs like Carbex, Eldepryl, Marplan, Nardil, and Parnate  methylene blue (injected into a vein) This medicine may also interact with the following medications:  aspirin and aspirin-like medicines  carbamazepine  certain medicines for depression, anxiety, or psychotic disturbances  certain medicines for migraine headache like almotriptan, eletriptan, frovatriptan, naratriptan, rizatriptan, sumatriptan, zolmitriptan  diuretics  fentanyl  furazolidone  isoniazid  lithium  medicines that treat or prevent blood clots like warfarin, enoxaparin, and dalteparin  NSAIDs, medicines for pain and inflammation, like ibuprofen or naproxen  phenytoin  procarbazine  quinidine  rasagiline  rifampin  safinamide  supplements like St. John's wort, kava kava, valerian  tramadol  tryptophan This list may not describe all possible interactions. Give your health care provider a list of all the medicines, herbs, non-prescription drugs, or dietary supplements you use. Also tell them if you smoke, drink alcohol, or use illegal drugs. Some items may interact with your medicine. What should I watch for while using this medicine? Visit your health care professional for regular checks on your progress. Tell your health care professional if your symptoms do not start to get better or if they get worse. Patients and their families should watch out for new or worsening depression or thoughts of suicide. Also watch out for sudden changes in feelings such as feeling anxious, agitated, panicky, irritable, hostile,  aggressive, impulsive, severely restless, overly excited and hyperactive, or not being able to sleep. If this happens, especially at the beginning of treatment or after a change in dose, call your healthcare professional. Dennis Bast may get  drowsy or dizzy. Do not drive, use machinery, or do anything that needs mental alertness until you know how this medicine affects you. Do not stand or sit up quickly, especially if you are an older patient. This reduces the risk of dizzy or fainting spells. What side effects may I notice from receiving this medicine? Side effects that you should report to your doctor or health care professional as soon as possible:  allergic reactions (skin rash, itching or hives, swelling of the face, lips, or tongue)  bleeding (bloody or black, tarry stools; red or dark brown urine; spitting up blood or brown material that looks like coffee grounds; red spots on the skin; unusual bruising or bleeding from the eyes, gums, or nose  changes in vision  elevated mood, decreased need for sleep, racing thoughts, impulsive behavior  eye pain  hallucinations  low sodium levels (headache; difficulty concentrating, confusion; feeling faint or lightheaded, falls; weakness; seizures)  serotonin syndrome (irritable; confusion; diarrhea; fast or irregular heartbeat; muscle twitching; stiff muscles; trouble walking; sweating; high fever; seizures; chills; vomiting)  suicidal thoughts or other mood changes Side effects that usually do not require medical attention (report to your doctor or health care professional if they continue or are bothersome):  change in sex drive or performance  constipation  dizziness  dry mouth  nausea This list may not describe all possible side effects. Call your doctor for medical advice about side effects. You may report side effects to FDA at 1-800-FDA-1088. Where should I keep my medicine? Keep out of the reach of children and pets. Store at room temperature between 20 and 25 degrees C (68 and 77 degrees F). Throw away any unused medicine after the expiration date. NOTE: This sheet is a summary. It may not cover all possible information. If you have questions about this medicine,  talk to your doctor, pharmacist, or health care provider.  2021 Elsevier/Gold Standard (2019-06-07 09:38:54)

## 2020-05-24 NOTE — Progress Notes (Signed)
Established patient visit   Patient: Megan Jimenez   DOB: 1965/06/20   55 y.o. Female  MRN: [id number] Visit Date: 05/24/2020  Today's healthcare provider: Mar Daring, PA-C   Chief Complaint  Patient presents with  . Insomnia  . Asthma  . Depression   Subjective    Depression        This is a chronic problem.  The problem has been gradually worsening (Especially in the last week.  She has not been able to work this week. ) since onset.  Associated symptoms include fatigue, insomnia, decreased interest and sad.  Associated symptoms include no decreased concentration, no appetite change and no headaches.   Follow up for Difficulty sleeping   The patient was last seen for this 1 months ago. Changes made at last visit include starting Lunesta 3mg  .  She reports excellent compliance with treatment. She feels that condition is Improved.  Pt states it worked great for about three weeks. Pt states she now seems to wake up prematurely and it not able to go back to sleep.   She is not having side effects.   -----------------------------------------------------------------------------------------   Patient Active Problem List   Diagnosis Date Noted  . Allergic rhinitis 11/17/2014  . Anxiety 09/08/2014  . Acute asthma exacerbation 09/08/2014  . Affective bipolar disorder ([place]) 09/08/2014  . 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/08/2014  . Headache, migraine 09/08/2014  . Excess, menstruation 09/08/2014   Past Medical History:  Diagnosis Date  . Asthma   . Bipolar 1 disorder (HCC)    Social History   Tobacco Use  . Smoking status: Former Smoker    Quit date: 03/17/2004    Years since quitting: 16.1  . Smokeless tobacco: Never Used  Vaping Use  . Vaping Use: Never used  Substance Use Topics  . Alcohol use: Yes    Comment: occ  . Drug use: No   No Known Allergies   Medications: Outpatient Medications Prior to Visit  Medication Sig  . albuterol (PROVENTIL  HFA;VENTOLIN HFA) 108 (90 Base) MCG/ACT inhaler Inhale 2 puffs into the lungs every 6 (six) hours as needed for wheezing or shortness of breath.  . ALPRAZolam (XANAX) 1 MG tablet Take 1 mg by mouth 3 (three) times daily as needed for anxiety.  . Eszopiclone 3 MG TABS Take 1 tablet (3 mg total) by mouth at bedtime. Take immediately before bedtime  . ferrous sulfate 325 (65 FE) MG tablet Take 325 mg by mouth daily with breakfast.  . fluticasone (FLONASE) 50 MCG/ACT nasal spray Place 2 sprays into both nostrils daily.  . fluticasone furoate-vilanterol (BREO ELLIPTA) 200-25 MCG/INH AEPB Inhale 1 puff into the lungs daily.  [name] ipratropium-albuterol (DUONEB) 0.5-2.5 (3) MG/3ML SOLN Take 3 mLs by nebulization every 6 (six) hours as needed.  . loratadine (CLARITIN) 10 MG tablet Take 10 mg by mouth daily as needed for allergies.  . Lurasidone HCl (LATUDA) 120 MG TABS Take by mouth.  . magnesium oxide (MAG-OX) 400 MG tablet Take 400 mg by mouth every evening.  . montelukast (SINGULAIR) 10 MG tablet TAKE 1 TABLET BY MOUTH EVERY DAY IN THE MORNING  . Multiple Vitamin (MULTIVITAMIN WITH MINERALS) TABS tablet Take 1 tablet by mouth daily.  . Vitamin D, Ergocalciferol, (DRISDOL) 50000 units CAPS capsule Take 50,000 Units by mouth every Friday.    No facility-administered medications prior to visit.    Review of Systems  Constitutional: Positive for activity change and fatigue. Negative for appetite  change, chills, diaphoresis, fever and unexpected weight change.  Respiratory: Negative.   Cardiovascular: Negative.   Gastrointestinal: Negative.   Neurological: Negative for dizziness, light-headedness and headaches.  Psychiatric/Behavioral: Positive for depression, dysphoric mood and sleep disturbance. Negative for agitation, behavioral problems, confusion, decreased concentration and hallucinations. The patient is nervous/anxious and has insomnia. The patient is not hyperactive.        Objective    BP (!)  157/98 (BP Location: Right Arm, Patient Position: Sitting, Cuff Size: Large)   Pulse 82   Temp 98.7 F (37.1 C) (Oral)   Wt 272 lb (123.4 kg)   LMP 09/28/2018   BMI 40.17 kg/m     Physical Exam Vitals reviewed.  Constitutional:      General: She is not in acute distress.    Appearance: Normal appearance. She is well-developed and well-groomed. She is obese. She is not ill-appearing or diaphoretic.  Cardiovascular:     Rate and Rhythm: Normal rate and regular rhythm.     Heart sounds: Normal heart sounds. No murmur heard. No friction rub. No gallop.   Pulmonary:     Effort: Pulmonary effort is normal. No respiratory distress.     Breath sounds: Normal breath sounds. No wheezing or rales.  Musculoskeletal:     Cervical back: Normal range of motion and neck supple.  Neurological:     Mental Status: She is alert.  Psychiatric:        Attention and Perception: Attention and perception normal.        Mood and Affect: Mood is anxious and depressed. Affect is flat and tearful.        Speech: Speech normal.        Behavior: Behavior normal. Behavior is cooperative.        Cognition and Memory: Cognition and memory normal.      No results found for any visits on 05/24/20.  Assessment & Plan     1. Screening for colon cancer Referral to GI placed.  - Ambulatory referral to Gastroenterology  2. Difficulty sleeping Will try Lunesta as below. Call if not improving.  - Eszopiclone 3 MG TABS; Take 1 tablet (3 mg total) by mouth at bedtime. Take immediately before bedtime  Dispense: 90 tablet; Refill: 1  3. Bipolar affective disorder, currently depressed, moderate ([place]) Followed by psychiatry.   4. Moderate persistent asthma with acute exacerbation Add Breo as below for asthma.  - fluticasone furoate-vilanterol (BREO ELLIPTA) 200-25 MCG/INH AEPB; Inhale 1 puff into the lungs daily.  Dispense: 60 each; Refill: 5   No follow-ups on file.      Megan Bowl, PA-C, have  reviewed all documentation for this visit. The documentation on 06/03/20 for the exam, diagnosis, procedures, and orders are all accurate and complete.   Megan Jimenez  [facility] [phone] (phone) [phone] (fax)  [place]

## 2020-05-25 DIAGNOSIS — F3132 Bipolar disorder, current episode depressed, moderate: Secondary | ICD-10-CM | POA: Diagnosis not present

## 2020-06-02 ENCOUNTER — Other Ambulatory Visit: Payer: Self-pay | Admitting: Physician Assistant

## 2020-06-02 DIAGNOSIS — J301 Allergic rhinitis due to pollen: Secondary | ICD-10-CM

## 2020-06-02 NOTE — Telephone Encounter (Signed)
Requested Prescriptions  Pending Prescriptions Disp Refills  . montelukast (SINGULAIR) 10 MG tablet [Pharmacy Med Name: MONTELUKAST SOD 10 MG TABLET] 90 tablet 1    Sig: TAKE 1 TABLET BY MOUTH EVERY DAY IN THE MORNING     Pulmonology:  Leukotriene Inhibitors Passed - 06/02/2020  9:12 AM      Passed - Valid encounter within last 12 months    Recent Outpatient Visits          1 week ago Screening for colon cancer   [place], Clearnce Sorrel, PA-C   1 month ago Annual physical exam   [facility], Clearnce Sorrel, [place]   1 year ago [place], Dionne Bucy, MD   2 years ago Encounter for annual physical exam   [facility] Fenton Malling M, [place]   2 years ago Asthma with acute exacerbation, unspecified asthma severity, unspecified whether persistent   [facility] Birdie Sons, MD      Future Appointments            In 2 weeks [place], Clearnce Sorrel, PA-C [employer], [place]

## 2020-06-03 ENCOUNTER — Encounter: Payer: Self-pay | Admitting: Physician Assistant

## 2020-06-06 ENCOUNTER — Telehealth (INDEPENDENT_AMBULATORY_CARE_PROVIDER_SITE_OTHER): Payer: Self-pay | Admitting: Gastroenterology

## 2020-06-06 ENCOUNTER — Other Ambulatory Visit: Payer: Self-pay

## 2020-06-06 DIAGNOSIS — Z1211 Encounter for screening for malignant neoplasm of colon: Secondary | ICD-10-CM

## 2020-06-06 MED ORDER — NA SULFATE-K SULFATE-MG SULF 17.5-3.13-1.6 GM/177ML PO SOLN: 1.0000 | Freq: Once | ORAL | 0 refills | Status: AC

## 2020-06-06 NOTE — Progress Notes (Signed)
Gastroenterology Pre-Procedure Review  Request Date: Monday 07/09/20 Requesting Physician: Dr. Vicente Males  PATIENT REVIEW QUESTIONS: The patient responded to the following health history questions as indicated:    1. Are you having any GI issues? no 2. Do you have a personal history of Polyps? no 3. Do you have a family history of Colon Cancer or Polyps? yes (Maternal grandmother colon cancer, mother colon polyps) 4. Diabetes Mellitus? no 5. Joint replacements in the past 12 months?no 6. Major health problems in the past 3 months?yes (Hospitalization in August 2021 for RSV) 7. Any artificial heart valves, MVP, or defibrillator?no    MEDICATIONS & ALLERGIES:    Patient reports the following regarding taking any anticoagulation/antiplatelet therapy:   Plavix, Coumadin, Eliquis, Xarelto, Lovenox, Pradaxa, Brilinta, or Effient? No Aspirin? 81 mg daily  Patient confirms/reports the following medications:  Current Outpatient Medications  Medication Sig Dispense Refill  . albuterol (PROVENTIL HFA;VENTOLIN HFA) 108 (90 Base) MCG/ACT inhaler Inhale 2 puffs into the lungs every 6 (six) hours as needed for wheezing or shortness of breath. 1 Inhaler 5  . ALPRAZolam (XANAX) 1 MG tablet Take 1 mg by mouth 3 (three) times daily as needed for anxiety.    . Eszopiclone 3 MG TABS Take 1 tablet (3 mg total) by mouth at bedtime. Take immediately before bedtime 90 tablet 1  . ferrous sulfate 325 (65 FE) MG tablet Take 325 mg by mouth daily with breakfast.    . fluticasone (FLONASE) 50 MCG/ACT nasal spray Place 2 sprays into both nostrils daily. 16 g 2  . fluticasone furoate-vilanterol (BREO ELLIPTA) 200-25 MCG/INH AEPB Inhale 1 puff into the lungs daily. 60 each 5  . ipratropium-albuterol (DUONEB) 0.5-2.5 (3) MG/3ML SOLN Take 3 mLs by nebulization every 6 (six) hours as needed. 360 mL 2  . loratadine (CLARITIN) 10 MG tablet Take 10 mg by mouth daily as needed for allergies.    . Lurasidone HCl (LATUDA) 120 MG  TABS Take by mouth.    . magnesium oxide (MAG-OX) 400 MG tablet Take 400 mg by mouth every evening.    . montelukast (SINGULAIR) 10 MG tablet TAKE 1 TABLET BY MOUTH EVERY DAY IN THE MORNING 90 tablet 1  . Multiple Vitamin (MULTIVITAMIN WITH MINERALS) TABS tablet Take 1 tablet by mouth daily.    . Vitamin D, Ergocalciferol, (DRISDOL) 50000 units CAPS capsule Take 50,000 Units by mouth every Friday.   5  . CAPLYTA 42 MG CAPS Take 1 capsule by mouth at bedtime.     No current facility-administered medications for this visit.    Patient confirms/reports the following allergies:  No Known Allergies  No orders of the defined types were placed in this encounter.   AUTHORIZATION INFORMATION Primary Insurance: 1D#: Group #:  Secondary Insurance: 1D#: Group #:  SCHEDULE INFORMATION: Date: 07/09/20 Time: Location:[facility]

## 2020-06-12 ENCOUNTER — Encounter: Payer: Self-pay | Admitting: Physician Assistant

## 2020-06-13 ENCOUNTER — Telehealth: Payer: BC Managed Care – PPO | Admitting: Emergency Medicine

## 2020-06-13 ENCOUNTER — Ambulatory Visit: Payer: Self-pay | Admitting: *Deleted

## 2020-06-13 ENCOUNTER — Other Ambulatory Visit: Payer: Self-pay | Admitting: Physician Assistant

## 2020-06-13 DIAGNOSIS — J069 Acute upper respiratory infection, unspecified: Secondary | ICD-10-CM

## 2020-06-13 DIAGNOSIS — J4520 Mild intermittent asthma, uncomplicated: Secondary | ICD-10-CM

## 2020-06-13 NOTE — Telephone Encounter (Signed)
   Notes to clinic: patient has upcoming appointment  Script has expired  Review for refill   Requested Prescriptions  Pending Prescriptions Disp Refills   albuterol (VENTOLIN HFA) 108 (90 Base) MCG/ACT inhaler [Pharmacy Med Name: ALBUTEROL HFA (VENTOLIN) INH] 18 each     Sig: TAKE 2 PUFFS BY MOUTH EVERY 4 HOURS AS NEEDED FOR COUGH OR WHEEZING      Pulmonology:  Beta Agonists Failed - 06/13/2020 12:41 PM      Failed - One inhaler should last at least one month. If the patient is requesting refills earlier, contact the patient to check for uncontrolled symptoms.      Passed - Valid encounter within last 12 months    Recent Outpatient Visits           2 weeks ago Screening for colon cancer   [place], Clearnce Sorrel, [place]   1 month ago Annual physical exam   [facility] [place], Clearnce Sorrel, [place]   1 year ago [place], Dionne Bucy, MD   2 years ago Encounter for annual physical exam   [facility] Fenton Malling M, [place]   2 years ago Asthma with acute exacerbation, unspecified asthma severity, unspecified whether persistent   [facility] Birdie Sons, MD       Future Appointments             In 1 week Marlyn Corporal, Clearnce Sorrel, PA-C [employer], [place]

## 2020-06-13 NOTE — Telephone Encounter (Signed)
Pt dropped forms off EOD yesterday. Forms placed in Jenni's box and blank copy is on file with medical records. Please advise. Thanks TNP

## 2020-06-13 NOTE — Telephone Encounter (Signed)
C/o difficulty breathing started approx 2 days ago and since yesterday had cough, congestion, "tickle in throat" and chest heaviness. Took NyQuil and did not feel better. Patient reports she is out of her albuterol and has already [facility] [address] in [place]. Constant Shortness of breath noted now and with any exertion. Recommended patient go to UC or ED for symptoms. Patient was requesting a virtual visit because she did not want to come in. No available visits noted.  Patient reports she will go to UC. Care advise given. Patient verbalized understanding of care advise and to go to [facility] or ED if symptoms worsen.   Reason for Disposition . [1] MILD difficulty breathing (e.g., minimal/no SOB at rest, SOB with walking, pulse <100) AND [2] NEW-onset or WORSE than normal  Answer Assessment - Initial Assessment Questions 1. RESPIRATORY STATUS: "Describe your breathing?" (e.g., wheezing, shortness of breath, unable to speak, severe coughing)      Shortness of breath, coughing, nasal stuffiness, chest heaviness 2. ONSET: "When did this breathing problem begin?"      Approx. 2 days ago  3. PATTERN "Does the difficult breathing come and go, or has it been constant since it started?"      Better today but now is constant   4. SEVERITY: "How bad is your breathing?" (e.g., mild, moderate, severe)    - MILD: No SOB at rest, mild SOB with walking, speaks normally in sentences, can lay down, no retractions, pulse < 100.    - MODERATE: SOB at rest, SOB with minimal exertion and prefers to sit, cannot lie down flat, speaks in phrases, mild retractions, audible wheezing, pulse 100-120.    - SEVERE: Very SOB at rest, speaks in single words, struggling to breathe, sitting hunched forward, retractions, pulse > 120      moderate 5. RECURRENT SYMPTOM: "Have you had difficulty breathing before?" If Yes, ask: "When was the last time?" and "What happened that time?"      Yes. Hx asthma 6. CARDIAC  HISTORY: "Do you have any history of heart disease?" (e.g., heart attack, angina, bypass surgery, angioplasty)      na 7. LUNG HISTORY: "Do you have any history of lung disease?"  (e.g., pulmonary embolus, asthma, emphysema)     asthma 8. CAUSE: "What do you think is causing the breathing problem?"      Gave out of rescue inhaler 9. OTHER SYMPTOMS: "Do you have any other symptoms? (e.g., dizziness, runny nose, cough, chest pain, fever)     Cough, congestion, chest heaviness, "tickle in throat", shortness of breath 10. PREGNANCY: "Is there any chance you are pregnant?" "When was your last menstrual period?"       na 11. TRAVEL: "Have you traveled out of the country in the last month?" (e.g., travel history, exposures)       na  Protocols used: BREATHING DIFFICULTY-A-AH

## 2020-06-13 NOTE — Progress Notes (Signed)
Megan Jimenez,  Based on what you shared with me, including your shortness of breath, I feel your condition warrants further evaluation and I recommend that you be seen for a face to face office visit.   NOTE: If you entered your credit card information for this eVisit, you will not be charged. You may see a "hold" on your card for the $35 but that hold will drop off and you will not have a charge processed.   If you are having a true medical emergency please call 911.      For an urgent face to face visit, [place] has five urgent care centers for your convenience:     [place] [facility] [place] [facility] [place] Get Driving Directions [phone] [address] [place], [place] [address] . 10 am - 6pm Monday - Friday    [place] [facility] [place] [facility]) Get Driving Directions [phone] [address] [place], [place] [address] . 10 am to 8 pm Monday-Friday . 12 pm to 8 pm [facility] Get Driving Directions [phone] [address], [place], [place] [address] . 8 am to 8 pm Monday-Friday . 9 am to 6 pm Saturday . 11 am to 6 pm Sunday     [facility] Get Driving Directions  [phone] [address], [place] [address] . 8 am to 8 pm Monday-Friday . 8 am to 4 pm [facility] at [place] Get Driving Directions [phone] [address] [place], [place] [address] . 12 pm to 6 pm Monday-Friday      Your e-visit answers were reviewed by a board certified advanced clinical practitioner to complete your personal care plan.  Thank you for using e-Visits.    Approximately 5 minutes was spent documenting and reviewing patient's chart.

## 2020-06-15 ENCOUNTER — Ambulatory Visit
Admission: EM | Admit: 2020-06-15 | Discharge: 2020-06-15 | Disposition: A | Discharge status: Home / Self Care | Payer: BC Managed Care – PPO | Attending: Emergency Medicine | Admitting: Emergency Medicine

## 2020-06-15 ENCOUNTER — Other Ambulatory Visit: Payer: Self-pay

## 2020-06-15 ENCOUNTER — Ambulatory Visit (INDEPENDENT_AMBULATORY_CARE_PROVIDER_SITE_OTHER): Payer: BC Managed Care – PPO

## 2020-06-15 ENCOUNTER — Encounter: Payer: Self-pay | Admitting: Emergency Medicine

## 2020-06-15 DIAGNOSIS — R062 Wheezing: Secondary | ICD-10-CM | POA: Diagnosis not present

## 2020-06-15 DIAGNOSIS — J069 Acute upper respiratory infection, unspecified: Secondary | ICD-10-CM

## 2020-06-15 DIAGNOSIS — Z720 Tobacco use: Secondary | ICD-10-CM

## 2020-06-15 DIAGNOSIS — J4541 Moderate persistent asthma with (acute) exacerbation: Secondary | ICD-10-CM

## 2020-06-15 DIAGNOSIS — R0602 Shortness of breath: Secondary | ICD-10-CM

## 2020-06-15 DIAGNOSIS — R059 Cough, unspecified: Secondary | ICD-10-CM | POA: Diagnosis not present

## 2020-06-15 MED ORDER — DEXAMETHASONE SODIUM PHOSPHATE 10 MG/ML IJ SOLN
10.0000 mg | Freq: Once | INTRAMUSCULAR | Status: AC
  Administered 2020-06-15: 10 mg via INTRAMUSCULAR

## 2020-06-15 MED ORDER — PREDNISONE 10 MG (21) PO TBPK: ORAL_TABLET | Freq: Every day | ORAL | 0 refills | Status: DC

## 2020-06-15 NOTE — ED Triage Notes (Addendum)
Pt reports shortness of breath, wheezing and cough for past few days. Has been using albuterol inhaler with no relief.  Pt states she has had this happen in the past and was given steroids.

## 2020-06-15 NOTE — ED Provider Notes (Signed)
[place]   [id number] 06/15/20 Arrival Time: 1120  Cc: COUGH  SUBJECTIVE:  Megan Jimenez is a 55 y.o. female who presents with SOB, wheezing, cough, congestion, sinus pain/ pressure, and fever, tmax of 101, x 1 week.  Denies sick exposure or precipitating event.  Describes cough as intermittent and nonproductive.  Has tried OTC medications and inhaler without relief.  Symptoms are made worse with deep breath.  Reports previous symptoms in the past with asthma flare and pneumonia.  Denies chest pain, nausea, changes in bowel or bladder habits.    ROS: As per HPI.  All other pertinent ROS negative.     Past Medical History:  Diagnosis Date  . Asthma   . Bipolar 1 disorder [facility])    Past Surgical History:  Procedure Laterality Date  . ANKLE FRACTURE SURGERY Right 1992  . CHOLECYSTECTOMY     No Known Allergies No current facility-administered medications on file prior to encounter.   Current Outpatient Medications on File Prior to Encounter  Medication Sig Dispense Refill  . albuterol (VENTOLIN HFA) 108 (90 Base) MCG/ACT inhaler TAKE 2 PUFFS BY MOUTH EVERY 4 HOURS AS NEEDED FOR COUGH OR WHEEZING 18 each 6  . ALPRAZolam (XANAX) 1 MG tablet Take 1 mg by mouth 3 (three) times daily as needed for anxiety.    . CAPLYTA 42 MG CAPS Take 1 capsule by mouth at bedtime.    . Eszopiclone 3 MG TABS Take 1 tablet (3 mg total) by mouth at bedtime. Take immediately before bedtime 90 tablet 1  . ferrous sulfate 325 (65 FE) MG tablet Take 325 mg by mouth daily with breakfast.    . fluticasone (FLONASE) 50 MCG/ACT nasal spray Place 2 sprays into both nostrils daily. 16 g 2  . fluticasone furoate-vilanterol (BREO ELLIPTA) 200-25 MCG/INH AEPB Inhale 1 puff into the lungs daily. 60 each 5  . ipratropium-albuterol (DUONEB) 0.5-2.5 (3) MG/3ML SOLN Take 3 mLs by nebulization every 6 (six) hours as needed. 360 mL 2  . loratadine (CLARITIN) 10 MG tablet Take 10 mg by mouth daily as needed for  allergies.    . Lurasidone HCl (LATUDA) 120 MG TABS Take by mouth.    . magnesium oxide (MAG-OX) 400 MG tablet Take 400 mg by mouth every evening.    . montelukast (SINGULAIR) 10 MG tablet TAKE 1 TABLET BY MOUTH EVERY DAY IN THE MORNING 90 tablet 1  . Multiple Vitamin (MULTIVITAMIN WITH MINERALS) TABS tablet Take 1 tablet by mouth daily.    . Vitamin D, Ergocalciferol, (DRISDOL) 50000 units CAPS capsule Take 50,000 Units by mouth every Friday.   5    Social History   Socioeconomic History  . Marital status: Married    Spouse name: Not on file  . Number of children: 2  . Years of education: College  . Highest education level: Not on file  Occupational History    Comment: Full-time  Tobacco Use  . Smoking status: Former Smoker    Quit date: 03/17/2004    Years since quitting: 16.2  . Smokeless tobacco: Never Used  Vaping Use  . Vaping Use: Never used  Substance and Sexual Activity  . Alcohol use: Yes    Comment: occ  . Drug use: No  . Sexual activity: Not on file  Other Topics Concern  . Not on file  Social History Narrative   Lives at home with husband   Social Determinants of Health   Financial Resource Strain: Not on file  Food Insecurity: Not on file  Transportation Needs: Not on file  Physical Activity: Not on file  Stress: Not on file  Social Connections: Not on file  Intimate Partner Violence: Not on file   Family History  Problem Relation Age of Onset  . COPD Mother   . Hyperlipidemia Mother   . Cancer Mother        Lung cancer  . Hyperlipidemia Father   . Leukemia Father   . COPD Father   . Depression Sister        ANXIETY  . Healthy Sister   . Breast cancer Maternal Aunt        2 mat aunts  . Breast cancer Cousin        pat cousin     OBJECTIVE:  Vitals:   06/15/20 1137 06/15/20 1150  BP: 128/85   Pulse: 86   Resp: 20   Temp: 97.6 F (36.4 C)   TempSrc: Tympanic   SpO2: 91% 93%     General appearance: Alert, appears fatigued, but  nontoxic; speaking in partial sentences HEENT:NCAT; Ears: EACs clear, TMs pearly gray; Eyes: PERRL.  EOM grossly intact. Nose: nares patent without rhinorrhea; Throat: tonsils nonerythematous or enlarged, uvula midline  Neck: supple without LAD Lungs: harsh rhonchi and wheezes heard throughout bilateral lung fields; moderate cough present Heart: regular rate and rhythm.   Skin: warm and dry Psychological: alert and cooperative; normal mood and affect  DIAGNOSTIC STUDIES:  DG Chest 2 View  Result Date: 06/15/2020 CLINICAL DATA:  Cough and wheezing, shortness of breath, smoker EXAM: CHEST - 2 VIEW COMPARISON:  01/21/2019 FINDINGS: The heart size and mediastinal contours are within normal limits. Both lungs are clear. The visualized skeletal structures are unremarkable. IMPRESSION: No active cardiopulmonary disease. Electronically Signed   By: Jerilynn Mages.  Shick M.D.   On: 06/15/2020 12:33    X-rays negative for cardiopulmonary disease  I have reviewed the x-rays myself and the radiologist interpretation. I am in agreement with the radiologist interpretation.     ASSESSMENT & PLAN:  1. Cough   2. Moderate persistent asthma with exacerbation   3. Viral URI with cough     Meds ordered this encounter  Medications  . dexamethasone (DECADRON) injection 10 mg  . predniSONE (STERAPRED UNI-PAK 21 TAB) 10 MG (21) TBPK tablet    Sig: Take by mouth daily. Take 6 tabs by mouth daily  for 2 days, then 5 tabs for 2 days, then 4 tabs for 2 days, then 3 tabs for 2 days, 2 tabs for 2 days, then 1 tab by mouth daily for 2 days    Dispense:  42 tablet    Refill:  0    Order Specific Question:   Supervising Provider    Answer:   Raylene Everts [[id number]]    Orders Placed This Encounter  Procedures  . DG Chest 2 View    Standing Status:   Standing    Number of Occurrences:   1    Order Specific Question:   Reason for Exam (SYMPTOM  OR DIAGNOSIS REQUIRED)    Answer:   cough, wheezing, sob    X-rays  negative for cardiopulmonary disease Steroid shot given in office Get plenty of rest and push fluids Prescribed prednisone taper take as directed and to completion Use OTC medication as needed for symptomatic relief Follow up with PCP for recheck and/or if symptoms persists Return or go to ER if you have any new or worsening symptoms  such as fever, chills, fatigue, shortness of breath, wheezing, chest pain, nausea, changes in bowel or bladder habits, etc...  Reviewed expectations re: course of current medical issues. Questions answered. Outlined signs and symptoms indicating need for more acute intervention. Patient verbalized understanding. After Visit Summary given.          Lestine Box, PA-C 06/15/20 1307

## 2020-06-15 NOTE — Discharge Instructions (Signed)
X-rays negative for cardiopulmonary disease Steroid shot given in office Get plenty of rest and push fluids Prescribed prednisone taper take as directed and to completion Use OTC medication as needed for symptomatic relief Follow up with PCP for recheck and/or if symptoms persists Return or go to ER if you have any new or worsening symptoms such as fever, chills, fatigue, shortness of breath, wheezing, chest pain, nausea, changes in bowel or bladder habits, etc..[name]

## 2020-06-20 NOTE — Progress Notes (Signed)
Established patient visit   Patient: Megan Jimenez   DOB: [birth year]   55 y.o. Female  MRN: [id number] Visit Date: 06/21/2020  Today's healthcare provider: Mar Daring, PA-C   No chief complaint on file.  Subjective    HPI  Depression, Follow-up  She  was last seen for this 3 weeks ago. Changes made at last visit include started on trintellix, and f/u with psychiatry.    She reports fair compliance with treatment. Patient reports she took one dose of trintellix then was started on Caplyta.  She is not having side effects.  She reports excellent tolerance of treatment. Current symptoms include: depressed mood, difficulty concentrating, fatigue, hopelessness, hypersomnia and insomnia She feels she is Improved since last visit.  Depression screen [facility] 2/9 06/21/2020 05/24/2020 04/26/2020  Decreased Interest 3 3 3   Down, Depressed, Hopeless 2 3 3   PHQ - 2 Score 5 6 6   Altered sleeping 3 3 3   Tired, decreased energy 3 3 3   Change in appetite 3 3 3   Feeling bad or failure about yourself  2 3 3   Trouble concentrating 3 3 3   Moving slowly or fidgety/restless 3 3 3   Suicidal thoughts 0 3 1  PHQ-9 Score 22 27 25   Difficult doing work/chores Extremely dIfficult Extremely dIfficult Extremely dIfficult    -----------------------------------------------------------------------------------------    Medications: Outpatient Medications Prior to Visit  Medication Sig  . albuterol (VENTOLIN HFA) 108 (90 Base) MCG/ACT inhaler TAKE 2 PUFFS BY MOUTH EVERY 4 HOURS AS NEEDED FOR COUGH OR WHEEZING  . ALPRAZolam (XANAX) 1 MG tablet Take 1 mg by mouth 3 (three) times daily as needed for anxiety.  . CAPLYTA 42 MG CAPS Take 1 capsule by mouth at bedtime.  . Eszopiclone 3 MG TABS Take 1 tablet (3 mg total) by mouth at bedtime. Take immediately before bedtime  . ferrous sulfate 325 (65 FE) MG tablet Take 325 mg by mouth daily with breakfast.  . fluticasone furoate-vilanterol (BREO  ELLIPTA) 200-25 MCG/INH AEPB Inhale 1 puff into the lungs daily.  [name] ipratropium-albuterol (DUONEB) 0.5-2.5 (3) MG/3ML SOLN Take 3 mLs by nebulization every 6 (six) hours as needed.  . loratadine (CLARITIN) 10 MG tablet Take 10 mg by mouth daily as needed for allergies.  . magnesium oxide (MAG-OX) 400 MG tablet Take 400 mg by mouth every evening.  . montelukast (SINGULAIR) 10 MG tablet TAKE 1 TABLET BY MOUTH EVERY DAY IN THE MORNING  . Multiple Vitamin (MULTIVITAMIN WITH MINERALS) TABS tablet Take 1 tablet by mouth daily.  . predniSONE (STERAPRED UNI-PAK 21 TAB) 10 MG (21) TBPK tablet Take by mouth daily. Take 6 tabs by mouth daily  for 2 days, then 5 tabs for 2 days, then 4 tabs for 2 days, then 3 tabs for 2 days, 2 tabs for 2 days, then 1 tab by mouth daily for 2 days  . Vitamin D, Ergocalciferol, (DRISDOL) 50000 units CAPS capsule Take 50,000 Units by mouth every Friday.   . fluticasone (FLONASE) 50 MCG/ACT nasal spray Place 2 sprays into both nostrils daily.  . [DISCONTINUED] Lurasidone HCl (LATUDA) 120 MG TABS Take by mouth. (Patient not taking: Reported on 06/21/2020)   No facility-administered medications prior to visit.    Review of Systems  Constitutional: Positive for activity change, chills and fatigue.  HENT: Positive for congestion.   Respiratory: Positive for cough, chest tightness, shortness of breath and wheezing.   Cardiovascular: Negative for chest pain and palpitations.  Psychiatric/Behavioral: Positive  for agitation, decreased concentration, dysphoric mood and sleep disturbance. The patient is nervous/anxious.     Last CBC Lab Results  Component Value Date   WBC 7.2 04/26/2020   HGB 13.4 04/26/2020   HCT 40.4 04/26/2020   MCV 91 04/26/2020   MCH 30.3 04/26/2020   RDW 13.5 04/26/2020   PLT 219 29/51/8841   Last metabolic panel Lab Results  Component Value Date   GLUCOSE 96 04/26/2020   NA 140 04/26/2020   K 4.3 04/26/2020   CL 102 04/26/2020   CO2 27 04/26/2020    BUN 18 04/26/2020   CREATININE 0.60 04/26/2020   GFRNONAA 103 04/26/2020   GFRAA 119 04/26/2020   CALCIUM 9.0 04/26/2020   PROT 5.7 (L) 04/26/2020   ALBUMIN 3.7 (L) 04/26/2020   LABGLOB 2.0 04/26/2020   AGRATIO 1.9 04/26/2020   BILITOT 0.3 04/26/2020   ALKPHOS 64 04/26/2020   AST 13 04/26/2020   ALT 16 04/26/2020   ANIONGAP 8 01/21/2019       Objective    BP (!) 157/91 (BP Location: Right Arm, Patient Position: Sitting, Cuff Size: Large)   Pulse 71   Temp 98.7 F (37.1 C) (Oral)   Resp 16   Wt 269 lb (122 kg)   LMP 09/28/2018   SpO2 95%   BMI 39.72 kg/m  BP Readings from Last 3 Encounters:  06/21/20 (!) 157/91  06/15/20 128/85  05/24/20 (!) 157/98   Wt Readings from Last 3 Encounters:  06/21/20 269 lb (122 kg)  05/24/20 272 lb (123.4 kg)  04/26/20 279 lb 11.2 oz (126.9 kg)       Physical Exam Vitals reviewed.  Constitutional:      General: She is not in acute distress.    Appearance: Normal appearance. She is well-developed. She is obese. She is ill-appearing. She is not diaphoretic.  Neck:     Thyroid: No thyromegaly.     Vascular: No JVD.     Trachea: No tracheal deviation.  Cardiovascular:     Rate and Rhythm: Normal rate and regular rhythm.     Heart sounds: Normal heart sounds. No murmur heard. No friction rub. No gallop.   Pulmonary:     Effort: Pulmonary effort is normal. No respiratory distress.     Breath sounds: Wheezing (harsh) and rhonchi present. No rales.  Musculoskeletal:     Cervical back: Normal range of motion and neck supple.  Lymphadenopathy:     Cervical: No cervical adenopathy.  Neurological:     Mental Status: She is alert.      No results found for any visits on 06/21/20.  Assessment & Plan     1. Atypical pneumonia Patient already on 12 day prednisone taper without improvement. Has been using rescue inhaler, daily inhalers, nebulizer treatments, all without relief. Will add zpak as below to cover atypicals as she would  be high risk for pneumonia with prolonged course and no improvements. Advised if not improving by tomorrow to please go to ER. - azithromycin (ZITHROMAX) 250 MG tablet; Take 2 tablets PO on day one, and one tablet PO daily thereafter until completed.  Dispense: 6 tablet; Refill: 0  2. Bipolar affective disorder, currently depressed, moderate (HCC) Slight improvements, despite illness above. Feels hopeful. Psychiatry started her on Caplyta. She had adjustment period with severe nausea and dizziness, but this has improved with continued taking and feels this is helping.    No follow-ups on file.      I, Mar Daring,  PA-C, have reviewed all documentation for this visit. The documentation on 06/21/20 for the exam, diagnosis, procedures, and orders are all accurate and complete.   Rubye Beach  [facility] [phone] (phone) [phone] (fax)  [place]

## 2020-06-21 ENCOUNTER — Other Ambulatory Visit: Payer: Self-pay

## 2020-06-21 ENCOUNTER — Encounter: Payer: Self-pay | Admitting: Physician Assistant

## 2020-06-21 ENCOUNTER — Ambulatory Visit: Payer: BC Managed Care – PPO | Admitting: Physician Assistant

## 2020-06-21 VITALS — BP 157/91 | HR 71 | Temp 98.7°F | Resp 16 | Wt 269.0 lb

## 2020-06-21 DIAGNOSIS — J189 Pneumonia, unspecified organism: Secondary | ICD-10-CM

## 2020-06-21 DIAGNOSIS — F3132 Bipolar disorder, current episode depressed, moderate: Secondary | ICD-10-CM

## 2020-06-21 MED ORDER — AZITHROMYCIN 250 MG PO TABS: ORAL_TABLET | ORAL | 0 refills | Status: DC

## 2020-06-25 ENCOUNTER — Telehealth: Payer: Self-pay

## 2020-06-25 NOTE — Telephone Encounter (Signed)
Copied from [place] [id number]. Topic: General - Other >> Jun 25, 2020  4:19 PM Alanda Slim E wrote: Reason for CRM: Pt was seen on 4.7.22 for complete short term disability paperwork/ But pts employer needs a return to work letter/ Pt is planning to return Wednesday 4.13.22/ please advise asap/ Pt needs letter to return to work

## 2020-06-26 ENCOUNTER — Telehealth: Payer: Self-pay

## 2020-06-26 ENCOUNTER — Other Ambulatory Visit: Payer: Self-pay

## 2020-06-26 ENCOUNTER — Emergency Department: Payer: BC Managed Care – PPO

## 2020-06-26 ENCOUNTER — Emergency Department
Admission: EM | Admit: 2020-06-26 | Discharge: 2020-06-26 | Disposition: A | Discharge status: Home / Self Care | Payer: BC Managed Care – PPO | Attending: Emergency Medicine | Admitting: Emergency Medicine

## 2020-06-26 DIAGNOSIS — Z87891 Personal history of nicotine dependence: Secondary | ICD-10-CM | POA: Diagnosis not present

## 2020-06-26 DIAGNOSIS — R0602 Shortness of breath: Secondary | ICD-10-CM | POA: Diagnosis not present

## 2020-06-26 DIAGNOSIS — J45909 Unspecified asthma, uncomplicated: Secondary | ICD-10-CM | POA: Insufficient documentation

## 2020-06-26 DIAGNOSIS — Z20822 Contact with and (suspected) exposure to covid-19: Secondary | ICD-10-CM | POA: Diagnosis not present

## 2020-06-26 DIAGNOSIS — R0600 Dyspnea, unspecified: Secondary | ICD-10-CM | POA: Diagnosis not present

## 2020-06-26 DIAGNOSIS — J9811 Atelectasis: Secondary | ICD-10-CM | POA: Diagnosis not present

## 2020-06-26 DIAGNOSIS — J441 Chronic obstructive pulmonary disease with (acute) exacerbation: Secondary | ICD-10-CM | POA: Insufficient documentation

## 2020-06-26 LAB — CBC WITH DIFFERENTIAL/PLATELET
Abs Immature Granulocytes: 0.08 10*3/uL — ABNORMAL HIGH (ref 0.00–0.07)
Basophils Absolute: 0.1 10*3/uL (ref 0.0–0.1)
Basophils Relative: 1 %
Eosinophils Absolute: 0.3 10*3/uL (ref 0.0–0.5)
Eosinophils Relative: 3 %
HCT: 42.9 % (ref 36.0–46.0)
Hemoglobin: 14.2 g/dL (ref 12.0–15.0)
Immature Granulocytes: 1 %
Lymphocytes Relative: 19 %
Lymphs Abs: 1.8 10*3/uL (ref 0.7–4.0)
MCH: 30.8 pg (ref 26.0–34.0)
MCHC: 33.1 g/dL (ref 30.0–36.0)
MCV: 93.1 fL (ref 80.0–100.0)
Monocytes Absolute: 0.8 10*3/uL (ref 0.1–1.0)
Monocytes Relative: 8 %
Neutro Abs: 6.5 10*3/uL (ref 1.7–7.7)
Neutrophils Relative %: 68 %
Platelets: 215 10*3/uL (ref 150–400)
RBC: 4.61 MIL/uL (ref 3.87–5.11)
RDW: 12.8 % (ref 11.5–15.5)
WBC: 9.5 10*3/uL (ref 4.0–10.5)
nRBC: 0 % (ref 0.0–0.2)

## 2020-06-26 LAB — COMPREHENSIVE METABOLIC PANEL
ALT: 29 U/L (ref 0–44)
AST: 17 U/L (ref 15–41)
Albumin: 3.6 g/dL (ref 3.5–5.0)
Alkaline Phosphatase: 47 U/L (ref 38–126)
Anion gap: 6 (ref 5–15)
BUN: 12 mg/dL (ref 6–20)
CO2: 29 mmol/L (ref 22–32)
Calcium: 8.9 mg/dL (ref 8.9–10.3)
Chloride: 102 mmol/L (ref 98–111)
Creatinine, Ser: 0.56 mg/dL (ref 0.44–1.00)
GFR, Estimated: >60 mL/min (ref >60)
Glucose, Bld: 105 mg/dL — ABNORMAL HIGH (ref 70–99)
Potassium: 4.3 mmol/L (ref 3.5–5.1)
Sodium: 137 mmol/L (ref 135–145)
Total Bilirubin: 0.5 mg/dL (ref 0.3–1.2)
Total Protein: 6.2 g/dL — ABNORMAL LOW (ref 6.5–8.1)

## 2020-06-26 LAB — RESP PANEL BY RT-PCR (FLU A&B, COVID) ARPGX2
Influenza A by PCR: NEGATIVE
Influenza B by PCR: NEGATIVE
SARS Coronavirus 2 by RT PCR: NEGATIVE

## 2020-06-26 LAB — LACTIC ACID, PLASMA: Lactic Acid, Venous: 1.4 mmol/L (ref 0.5–1.9)

## 2020-06-26 LAB — PROTIME-INR
INR: 0.8 (ref 0.8–1.2)
Prothrombin Time: 11.2 seconds — ABNORMAL LOW (ref 11.4–15.2)

## 2020-06-26 LAB — APTT: aPTT: <24 seconds — ABNORMAL LOW (ref 24–36)

## 2020-06-26 MED ORDER — GUAIFENESIN ER 600 MG PO TB12: 600.0000 mg | ORAL_TABLET | Freq: Two times a day (BID) | ORAL | 0 refills | Status: AC

## 2020-06-26 MED ORDER — IPRATROPIUM-ALBUTEROL 0.5-2.5 (3) MG/3ML IN SOLN
3.0000 mL | Freq: Once | RESPIRATORY_TRACT | Status: AC
  Administered 2020-06-26: 3 mL via RESPIRATORY_TRACT
  Filled 2020-06-26: qty 3

## 2020-06-26 MED ORDER — ALBUTEROL SULFATE (2.5 MG/3ML) 0.083% IN NEBU: 2.5000 mg | INHALATION_SOLUTION | Freq: Four times a day (QID) | RESPIRATORY_TRACT | 0 refills | Status: DC | PRN

## 2020-06-26 MED ORDER — PREDNISONE 10 MG PO TABS: ORAL_TABLET | ORAL | 0 refills | Status: AC

## 2020-06-26 MED ORDER — MAGNESIUM SULFATE 2 GM/50ML IV SOLN
2.0000 g | INTRAVENOUS | Status: AC
  Administered 2020-06-26: 2 g via INTRAVENOUS
  Filled 2020-06-26: qty 50

## 2020-06-26 MED ORDER — ALBUTEROL SULFATE (2.5 MG/3ML) 0.083% IN NEBU
5.0000 mg | INHALATION_SOLUTION | Freq: Once | RESPIRATORY_TRACT | Status: AC
  Administered 2020-06-26: 5 mg via RESPIRATORY_TRACT
  Filled 2020-06-26: qty 6

## 2020-06-26 MED ORDER — DOXYCYCLINE HYCLATE 100 MG PO CAPS: 100.0000 mg | ORAL_CAPSULE | Freq: Two times a day (BID) | ORAL | 0 refills | Status: AC

## 2020-06-26 MED ORDER — SODIUM CHLORIDE 0.9 % IV BOLUS (SEPSIS)
1000.0000 mL | Freq: Once | INTRAVENOUS | Status: AC
  Administered 2020-06-26: 1000 mL via INTRAVENOUS

## 2020-06-26 MED ORDER — METHYLPREDNISOLONE SODIUM SUCC 125 MG IJ SOLR
125.0000 mg | Freq: Once | INTRAMUSCULAR | Status: AC
  Administered 2020-06-26: 125 mg via INTRAVENOUS
  Filled 2020-06-26: qty 2

## 2020-06-26 NOTE — Telephone Encounter (Signed)
Copied from [place] [id number]. Topic: General - Other >> Jun 26, 2020  2:01 PM Tessa Lerner A wrote: Reason for CRM: Patient has made contact regarding a letter excusing them from work  Patient was seen in office on 06/21/20. Patient would like to be notified when the letter is completed.  Patient would like the letter emailed to them or posted in [place] if possible  Please contact to further advise when possible.

## 2020-06-26 NOTE — ED Notes (Signed)
EDP at bedside  

## 2020-06-26 NOTE — ED Triage Notes (Signed)
Pt arrives to ER c/o asthma attack. State saw PCP x 2 and UC x 1. Has finished steroids and antibiotics. Pt is having a difficult time breathing, only able to talk in short sentences. Has inhalers at home. A&O. States fever at home.

## 2020-06-26 NOTE — ED Provider Notes (Signed)
[facility] Emergency Department Provider Note  ____________________________________________  Time seen: Approximately 2:28 PM  I have reviewed the triage vital signs and the nursing notes.   HISTORY  Chief Complaint Shortness of Breath    HPI Megan Jimenez is a 55 y.o. female with a history of bipolar disorder and asthma who comes ED complaining of asthma attack, shortness of breath for the past 5 or 6 days.  She saw her doctor, completed a course of azithromycin and steroids yesterday.  She has been using her home inhalers and nebulizer treatment but still symptomatic.  She is also taking all of her asthma medications as prescribed.  Denies chest pain.  No significant cough.  No fevers or chills.  Symptoms are constant, severe.  No aggravating or alleviating factors.   She reports this happens often with change of season and pollen as is currently happening with the springtime.     Past Medical History:  Diagnosis Date  . Asthma   . Bipolar 1 disorder [facility])      Patient Active Problem List   Diagnosis Date Noted  . Allergic rhinitis 11/17/2014  . Anxiety 09/08/2014  . Acute asthma exacerbation 09/08/2014  . Affective bipolar disorder ([place]) 09/08/2014  . 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/08/2014  . Headache, migraine 09/08/2014  . Excess, menstruation 09/08/2014     Past Surgical History:  Procedure Laterality Date  . ANKLE FRACTURE SURGERY Right 1992  . CHOLECYSTECTOMY       Prior to Admission medications   Medication Sig Start Date End Date Taking? Authorizing Provider  albuterol (PROVENTIL) (2.5 MG/3ML) 0.083% nebulizer solution Take 3 mLs (2.5 mg total) by nebulization every 6 (six) hours as needed for wheezing or shortness of breath. 06/26/20  Yes Carrie Mew, MD  doxycycline (VIBRAMYCIN) 100 MG capsule Take 1 capsule (100 mg total) by mouth 2 (two) times daily for 10 days. 06/26/20 07/06/20 Yes Carrie Mew, MD  guaiFENesin (MUCINEX)  600 MG 12 hr tablet Take 1 tablet (600 mg total) by mouth 2 (two) times daily for 15 days. 06/26/20 07/11/20 Yes Carrie Mew, MD  predniSONE (DELTASONE) 10 MG tablet Take 5 tablets (50 mg total) by mouth daily for 3 days, THEN 4 tablets (40 mg total) daily for 3 days, THEN 3 tablets (30 mg total) daily for 3 days, THEN 2 tablets (20 mg total) daily for 3 days, THEN 1 tablet (10 mg total) daily for 3 days. 06/26/20 07/11/20 Yes Carrie Mew, MD  albuterol (VENTOLIN HFA) 108 (90 Base) MCG/ACT inhaler TAKE 2 PUFFS BY MOUTH EVERY 4 HOURS AS NEEDED FOR COUGH OR WHEEZING 06/13/20   Fenton Malling M, PA-C  ALPRAZolam Duanne Moron) 1 MG tablet Take 1 mg by mouth 3 (three) times daily as needed for anxiety.    [provider]  azithromycin (ZITHROMAX) 250 MG tablet Take 2 tablets PO on day one, and one tablet PO daily thereafter until completed. 06/21/20   Mar Daring, PA-C  CAPLYTA 42 MG CAPS Take 1 capsule by mouth at bedtime. 06/01/20   [provider]  Eszopiclone 3 MG TABS Take 1 tablet (3 mg total) by mouth at bedtime. Take immediately before bedtime 05/24/20   Mar Daring, PA-C  ferrous sulfate 325 (65 FE) MG tablet Take 325 mg by mouth daily with breakfast.    [provider]  fluticasone (FLONASE) 50 MCG/ACT nasal spray Place 2 sprays into both nostrils daily. 09/30/17   Dustin Flock, MD  fluticasone furoate-vilanterol (BREO ELLIPTA) [phone]  MCG/INH AEPB Inhale 1 puff into the lungs daily. 05/24/20   Mar Daring, PA-C  ipratropium-albuterol (DUONEB) 0.5-2.5 (3) MG/3ML SOLN Take 3 mLs by nebulization every 6 (six) hours as needed. 09/30/17   Dustin Flock, MD  loratadine (CLARITIN) 10 MG tablet Take 10 mg by mouth daily as needed for allergies.    [provider]  magnesium oxide (MAG-OX) 400 MG tablet Take 400 mg by mouth every evening.    [provider]  montelukast (SINGULAIR) 10 MG tablet TAKE 1 TABLET BY MOUTH EVERY DAY IN THE  MORNING 06/02/20   Mar Daring, PA-C  Multiple Vitamin (MULTIVITAMIN WITH MINERALS) TABS tablet Take 1 tablet by mouth daily.    [provider]  Vitamin D, Ergocalciferol, (DRISDOL) 50000 units CAPS capsule Take 50,000 Units by mouth every Friday.     [provider]     Allergies Patient has no known allergies.   Family History  Problem Relation Age of Onset  . COPD Mother   . Hyperlipidemia Mother   . Cancer Mother        Lung cancer  . Hyperlipidemia Father   . Leukemia Father   . COPD Father   . Depression Sister        ANXIETY  . Healthy Sister   . Breast cancer Maternal Aunt        2 mat aunts  . Breast cancer Cousin        pat cousin    Social History Social History   Tobacco Use  . Smoking status: Former Smoker    Quit date: 03/17/2004    Years since quitting: 16.2  . Smokeless tobacco: Never Used  Vaping Use  . Vaping Use: Never used  Substance Use Topics  . Alcohol use: Yes    Comment: occ  . Drug use: No    Review of Systems  Constitutional:   No fever or chills.  ENT:   No sore throat. No rhinorrhea. Cardiovascular:   No chest pain or syncope. Respiratory:   Positive shortness of breath without cough. Gastrointestinal:   Negative for abdominal pain, vomiting and diarrhea.  Musculoskeletal:   Negative for focal pain or swelling All other systems reviewed and are negative except as documented above in ROS and HPI.  ____________________________________________   PHYSICAL EXAM:  VITAL SIGNS: ED Triage Vitals  Enc Vitals Group     BP 06/26/20 1028 (!) 159/97     Pulse Rate 06/26/20 1026 77     Resp 06/26/20 1026 16     Temp 06/26/20 1026 98.2 F (36.8 C)     Temp Source 06/26/20 1026 Oral     SpO2 06/26/20 1026 96 %     Weight 06/26/20 1026 268 lb (121.6 kg)     Height 06/26/20 1026 5\' 9"  (1.753 m)     Head Circumference --      Peak Flow --      Pain Score 06/26/20 1025 8     Pain Loc --      Pain Edu? --       Excl. in [place]? --     Vital signs reviewed, nursing assessments reviewed.   Constitutional:   Alert and oriented. Non-toxic appearance. Eyes:   Conjunctivae are normal. EOMI. PERRL. ENT      Head:   Normocephalic and atraumatic.      Nose:   Wearing a mask.      Mouth/Throat:   Wearing a mask.  Neck:   No meningismus. Full ROM. Hematological/Lymphatic/Immunilogical:   No cervical lymphadenopathy. Cardiovascular:   RRR. Symmetric bilateral radial and DP pulses.  No murmurs. Cap refill less than 2 seconds. Respiratory:   Normal respiratory effort without tachypnea/retractions.  Diffuse expiratory wheezing and prolonged expiratory phase.  No focal crackles Gastrointestinal:   Soft and nontender. Non distended. There is no CVA tenderness.  No rebound, rigidity, or guarding. Genitourinary:   deferred Musculoskeletal:   Normal range of motion in all extremities. No joint effusions.  No lower extremity tenderness.  No edema. Neurologic:   Normal speech and language.  Motor grossly intact. No acute focal neurologic deficits are appreciated.  Skin:    Skin is warm, dry and intact. No rash noted.  No petechiae, purpura, or bullae.  ____________________________________________    LABS (pertinent positives/negatives) (all labs ordered are listed, but only abnormal results are displayed) Labs Reviewed  COMPREHENSIVE METABOLIC PANEL - Abnormal; Notable for the following components:      Result Value   Glucose, Bld 105 (*)    Total Protein 6.2 (*)    All other components within normal limits  CBC WITH DIFFERENTIAL/PLATELET - Abnormal; Notable for the following components:   Abs Immature Granulocytes 0.08 (*)    All other components within normal limits  PROTIME-INR - Abnormal; Notable for the following components:   Prothrombin Time 11.2 (*)    All other components within normal limits  APTT - Abnormal; Notable for the following components:   aPTT <24 (*)    All other components  within normal limits  RESP PANEL BY RT-PCR (FLU A&B, COVID) ARPGX2  CULTURE, BLOOD (SINGLE)  LACTIC ACID, PLASMA   ____________________________________________   EKG  Interpreted by me  Date: 06/26/2020  Rate: 80  Rhythm: normal sinus rhythm  QRS Axis: normal  Intervals: normal  ST/T Wave abnormalities: normal  Conduction Disutrbances: none  Narrative Interpretation: unremarkable      ____________________________________________    RADIOLOGY  DG Chest Portable 1 View  Result Date: 06/26/2020 CLINICAL DATA:  Difficulty breathing, asthma, inhaler use EXAM: PORTABLE CHEST 1 VIEW COMPARISON:  06/15/2020 FINDINGS: Normal heart size and vascularity. Minor left mid lung and bibasilar bandlike opacities compatible with atelectasis. No focal pneumonia, significant collapse or consolidation. No effusion or pneumothorax. Trachea midline. Degenerative changes of the spine and mild scoliosis as before. IMPRESSION: Minor left mid lung and bibasilar atelectasis. Electronically Signed   By: Jerilynn Mages.  Shick M.D.   On: 06/26/2020 11:09    ____________________________________________   PROCEDURES Procedures  ____________________________________________  DIFFERENTIAL DIAGNOSIS   Pneumonia, pleural effusion, asthma/COPD exacerbation, Covid/flu  CLINICAL IMPRESSION / ASSESSMENT AND PLAN / ED COURSE  Medications ordered in the ED: Medications  methylPREDNISolone sodium succinate (SOLU-MEDROL) 125 mg/2 mL injection 125 mg (125 mg Intravenous Given 06/26/20 1034)  magnesium sulfate IVPB 2 g 50 mL (0 g Intravenous Stopped 06/26/20 1136)  ipratropium-albuterol (DUONEB) 0.5-2.5 (3) MG/3ML nebulizer solution 3 mL (3 mLs Nebulization Given 06/26/20 1034)  albuterol (PROVENTIL) (2.5 MG/3ML) 0.083% nebulizer solution 5 mg (5 mg Nebulization Given 06/26/20 1034)  sodium chloride 0.9 % bolus 1,000 mL (0 mLs Intravenous Stopped 06/26/20 1114)    Pertinent labs & imaging results that were available during  my care of the patient were reviewed by me and considered in my medical decision making (see chart for details).  Megan Jimenez was evaluated in Emergency Department on 06/26/2020 for the symptoms described in the history of present illness. She was evaluated in the  context of the global COVID-19 pandemic, which necessitated consideration that the patient might be at risk for infection with the SARS-CoV-2 virus that causes COVID-19. Institutional protocols and algorithms that pertain to the evaluation of patients at risk for COVID-19 are in a state of rapid change based on information released by regulatory bodies including the CDC and federal and state organizations. These policies and algorithms were followed during the patient's care in the ED.   Patient presents with shortness of breath, wheezing and exam consistent with bronchospasm.  Patient given steroids magnesium and nebulizer treatments.  Vital signs are normal.  On reassessment, still wheezing but symptomatically improved.  Offered admission, patient declines at this time due to wanting to be at home with her husband and her pets and feeling like this may be manageable with continued steroids and refill of her albuterol nebulizer solution.  Also prescribe doxycycline, return precautions discussed.  Advised her that if anytime she changes her mind or feels that her symptoms are worsening she should return for hospitalization.  Doubt ACS PE dissection or pericardial effusion.      ____________________________________________   FINAL CLINICAL IMPRESSION(S) / ED DIAGNOSES    Final diagnoses:  COPD exacerbation [facility])     ED Discharge Orders         Ordered    doxycycline (VIBRAMYCIN) 100 MG capsule  2 times daily        06/26/20 1428    predniSONE (DELTASONE) 10 MG tablet        06/26/20 1428    guaiFENesin (MUCINEX) 600 MG 12 hr tablet  2 times daily        06/26/20 1428    albuterol (PROVENTIL) (2.5 MG/3ML) 0.083% nebulizer  solution  Every 6 hours PRN        06/26/20 1428          Portions of this note were generated with dragon dictation software. Dictation errors may occur despite best attempts at proofreading.   Carrie Mew, MD 06/26/20 [phone]

## 2020-06-27 ENCOUNTER — Encounter: Payer: Self-pay | Admitting: Physician Assistant

## 2020-06-28 NOTE — Telephone Encounter (Signed)
Dr Caryn Section are you ok with doing this?

## 2020-06-29 NOTE — Telephone Encounter (Signed)
OK, to print up a return to work note for 06-27-2020. Thanks.

## 2020-06-29 NOTE — Telephone Encounter (Signed)
Left detailed message advising letter is ready.

## 2020-07-01 LAB — CULTURE, BLOOD (SINGLE)
Culture: NO GROWTH
Special Requests: ADEQUATE

## 2020-07-06 ENCOUNTER — Encounter: Payer: Self-pay | Admitting: Gastroenterology

## 2020-07-09 ENCOUNTER — Ambulatory Visit
Admission: RE | Admit: 2020-07-09 | Discharge: 2020-07-09 | Disposition: A | Discharge status: Home / Self Care | Payer: BC Managed Care – PPO | Attending: Gastroenterology | Admitting: Gastroenterology

## 2020-07-09 ENCOUNTER — Ambulatory Visit: Payer: BC Managed Care – PPO | Admitting: Anesthesiology

## 2020-07-09 ENCOUNTER — Encounter: Payer: Self-pay | Admitting: Gastroenterology

## 2020-07-09 ENCOUNTER — Encounter
Admission: RE | Disposition: A | Discharge status: Home / Self Care | Payer: Self-pay | Source: Home / Self Care | Attending: Gastroenterology

## 2020-07-09 ENCOUNTER — Other Ambulatory Visit: Payer: Self-pay

## 2020-07-09 DIAGNOSIS — Z87891 Personal history of nicotine dependence: Secondary | ICD-10-CM | POA: Diagnosis not present

## 2020-07-09 DIAGNOSIS — Z7951 Long term (current) use of inhaled steroids: Secondary | ICD-10-CM | POA: Diagnosis not present

## 2020-07-09 DIAGNOSIS — D122 Benign neoplasm of ascending colon: Secondary | ICD-10-CM | POA: Insufficient documentation

## 2020-07-09 DIAGNOSIS — Z1211 Encounter for screening for malignant neoplasm of colon: Secondary | ICD-10-CM

## 2020-07-09 DIAGNOSIS — D120 Benign neoplasm of cecum: Secondary | ICD-10-CM | POA: Insufficient documentation

## 2020-07-09 DIAGNOSIS — K635 Polyp of colon: Secondary | ICD-10-CM

## 2020-07-09 DIAGNOSIS — K5730 Diverticulosis of large intestine without perforation or abscess without bleeding: Secondary | ICD-10-CM | POA: Diagnosis not present

## 2020-07-09 DIAGNOSIS — Z79899 Other long term (current) drug therapy: Secondary | ICD-10-CM | POA: Insufficient documentation

## 2020-07-09 DIAGNOSIS — Z801 Family history of malignant neoplasm of trachea, bronchus and lung: Secondary | ICD-10-CM | POA: Diagnosis not present

## 2020-07-09 DIAGNOSIS — Z803 Family history of malignant neoplasm of breast: Secondary | ICD-10-CM | POA: Insufficient documentation

## 2020-07-09 HISTORY — PX: COLONOSCOPY WITH PROPOFOL: SHX5780

## 2020-07-09 SURGERY — COLONOSCOPY WITH PROPOFOL
Anesthesia: General

## 2020-07-09 MED ORDER — PROPOFOL 500 MG/50ML IV EMUL
INTRAVENOUS | Status: AC
  Filled 2020-07-09: qty 50

## 2020-07-09 MED ORDER — SODIUM CHLORIDE 0.9 % IV SOLN: INTRAVENOUS | Status: DC

## 2020-07-09 MED ORDER — MIDAZOLAM HCL 2 MG/2ML IJ SOLN
INTRAMUSCULAR | Status: DC | PRN
  Administered 2020-07-09: 2 mg via INTRAVENOUS

## 2020-07-09 MED ORDER — PROPOFOL 500 MG/50ML IV EMUL
INTRAVENOUS | Status: DC | PRN
  Administered 2020-07-09: 150 ug/kg/min via INTRAVENOUS

## 2020-07-09 MED ORDER — PHENYLEPHRINE HCL (PRESSORS) 10 MG/ML IV SOLN
INTRAVENOUS | Status: AC
  Filled 2020-07-09: qty 1

## 2020-07-09 MED ORDER — PROPOFOL 10 MG/ML IV BOLUS
INTRAVENOUS | Status: DC | PRN
  Administered 2020-07-09: 100 mg via INTRAVENOUS

## 2020-07-09 MED ORDER — MIDAZOLAM HCL 2 MG/2ML IJ SOLN
INTRAMUSCULAR | Status: AC
  Filled 2020-07-09: qty 2

## 2020-07-09 NOTE — H&P (Signed)
Jonathon Bellows, MD [address] [place], [place], [place], [address] [address], [place], [place], [place], [address] Phone: [phone]  Fax: [phone]  Primary Care Physician:  Mar Daring, PA-C   Pre-Procedure History & Physical: HPI:  Megan Jimenez is a 54 y.o. female is here for an colonoscopy.   Past Medical History:  Diagnosis Date  . Asthma   . Bipolar 1 disorder [facility])     Past Surgical History:  Procedure Laterality Date  . ANKLE FRACTURE SURGERY Right 1992  . CHOLECYSTECTOMY      Prior to Admission medications   Medication Sig Start Date End Date Taking? Authorizing Provider  CAPLYTA 42 MG CAPS Take 1 capsule by mouth at bedtime. 06/01/20  Yes [provider]  ferrous sulfate 325 (65 FE) MG tablet Take 325 mg by mouth daily with breakfast.   Yes [provider]  fluticasone furoate-vilanterol (BREO ELLIPTA) 200-25 MCG/INH AEPB Inhale 1 puff into the lungs daily. 05/24/20  Yes Fenton Malling M, PA-C  loratadine (CLARITIN) 10 MG tablet Take 10 mg by mouth daily as needed for allergies.   Yes [provider]  montelukast (SINGULAIR) 10 MG tablet TAKE 1 TABLET BY MOUTH EVERY DAY IN THE MORNING 06/02/20  Yes Burnette, Anderson Malta M, PA-C  predniSONE (DELTASONE) 10 MG tablet Take 5 tablets (50 mg total) by mouth daily for 3 days, THEN 4 tablets (40 mg total) daily for 3 days, THEN 3 tablets (30 mg total) daily for 3 days, THEN 2 tablets (20 mg total) daily for 3 days, THEN 1 tablet (10 mg total) daily for 3 days. 06/26/20 07/11/20 Yes Carrie Mew, MD  albuterol (PROVENTIL) (2.5 MG/3ML) 0.083% nebulizer solution Take 3 mLs (2.5 mg total) by nebulization every 6 (six) hours as needed for wheezing or shortness of breath. 06/26/20   Carrie Mew, MD  albuterol (VENTOLIN HFA) 108 (90 Base) MCG/ACT inhaler TAKE 2 PUFFS BY MOUTH EVERY 4 HOURS AS NEEDED FOR COUGH OR WHEEZING 06/13/20   Fenton Malling M, PA-C  ALPRAZolam  Duanne Moron) 1 MG tablet Take 1 mg by mouth 3 (three) times daily as needed for anxiety.    [provider]  Eszopiclone 3 MG TABS Take 1 tablet (3 mg total) by mouth at bedtime. Take immediately before bedtime 05/24/20   Fenton Malling M, PA-C  fluticasone [facility]) 50 MCG/ACT nasal spray Place 2 sprays into both nostrils daily. 09/30/17   Dustin Flock, MD  guaiFENesin (MUCINEX) 600 MG 12 hr tablet Take 1 tablet (600 mg total) by mouth 2 (two) times daily for 15 days. 06/26/20 07/11/20  Carrie Mew, MD  ipratropium-albuterol (DUONEB) 0.5-2.5 (3) MG/3ML SOLN Take 3 mLs by nebulization every 6 (six) hours as needed. 09/30/17   Dustin Flock, MD  magnesium oxide (MAG-OX) 400 MG tablet Take 400 mg by mouth every evening.    [provider]  Multiple Vitamin (MULTIVITAMIN WITH MINERALS) TABS tablet Take 1 tablet by mouth daily.    [provider]  Vitamin D, Ergocalciferol, (DRISDOL) 50000 units CAPS capsule Take 50,000 Units by mouth every Friday.     [provider]    Allergies as of 06/07/2020  . (No Known Allergies)    Family History  Problem Relation Age of Onset  . COPD Mother   . Hyperlipidemia Mother   . Cancer Mother        Lung cancer  . Hyperlipidemia Father   . Leukemia Father   . COPD Father   .  Depression Sister        ANXIETY  . Healthy Sister   . Breast cancer Maternal Aunt        2 mat aunts  . Breast cancer Cousin        pat cousin    Social History   Socioeconomic History  . Marital status: Married    Spouse name: Not on file  . Number of children: 2  . Years of education: College  . Highest education level: Not on file  Occupational History    Comment: Full-time  Tobacco Use  . Smoking status: Former Smoker    Quit date: 03/17/2004    Years since quitting: 16.3  . Smokeless tobacco: Never Used  Vaping Use  . Vaping Use: Never used  Substance and Sexual Activity  . Alcohol use: Yes    Comment: occ  . Drug use:  No  . Sexual activity: Not on file  Other Topics Concern  . Not on file  Social History Narrative   Lives at home with husband   Social Determinants of Health   Financial Resource Strain: Not on file  Food Insecurity: Not on file  Transportation Needs: Not on file  Physical Activity: Not on file  Stress: Not on file  Social Connections: Not on file  Intimate Partner Violence: Not on file    Review of Systems: See HPI, otherwise negative ROS  Physical Exam: BP (!) 180/96   Pulse 71   Temp (!) 97 F (36.1 C) (Temporal)   Resp 18   Ht 5\' 9"  (1.753 m)   Wt 121.6 kg   LMP 09/28/2018   SpO2 97%   BMI 39.58 kg/m  General:   Alert,  pleasant and cooperative in NAD Head:  Normocephalic and atraumatic. Neck:  Supple; no masses or thyromegaly. Lungs:  Clear throughout to auscultation, normal respiratory effort.    Heart:  +S1, +S2, Regular rate and rhythm, No edema. Abdomen:  Soft, nontender and nondistended. Normal bowel sounds, without guarding, and without rebound.   Neurologic:  Alert and  oriented x4;  grossly normal neurologically.  Impression/Plan: Megan Jimenez is here for an colonoscopy to be performed for Screening colonoscopy average risk   Risks, benefits, limitations, and alternatives regarding  colonoscopy have been reviewed with the patient.  Questions have been answered.  All parties agreeable.   Jonathon Bellows, MD  07/09/2020, 7:31 AM

## 2020-07-09 NOTE — Transfer of Care (Signed)
Immediate Anesthesia Transfer of Care Note  Patient: Megan Jimenez  Procedure(s) Performed: COLONOSCOPY WITH PROPOFOL (N/A )  Patient Location: PACU and Endoscopy Unit  Anesthesia Type:General  Level of Consciousness: drowsy and patient cooperative  Airway & Oxygen Therapy: Patient Spontanous Breathing  Post-op Assessment: Report given to RN and Post -op Vital signs reviewed and stable  Post vital signs: Reviewed and stable  Last Vitals:  Vitals Value Taken Time  BP 107/97 07/09/20 0807  Temp 35.9 C 07/09/20 0806  Pulse 77 07/09/20 0807  Resp 16 07/09/20 0807  SpO2 97 % 07/09/20 0807  Vitals shown include unvalidated device data.  Last Pain:  Vitals:   07/09/20 0806  TempSrc: Temporal  PainSc: 0-No pain         Complications: No complications documented.

## 2020-07-09 NOTE — Anesthesia Postprocedure Evaluation (Signed)
Anesthesia Post Note  Patient: Megan Jimenez  Procedure(s) Performed: COLONOSCOPY WITH PROPOFOL (N/A )  Patient location during evaluation: Endoscopy Anesthesia Type: General Level of consciousness: awake and alert and oriented Pain management: pain level controlled Vital Signs Assessment: post-procedure vital signs reviewed and stable Respiratory status: spontaneous breathing Cardiovascular status: blood pressure returned to baseline Anesthetic complications: no   No complications documented.   Last Vitals:  Vitals:   07/09/20 0816 07/09/20 0826  BP: 139/79 (!) 149/78  Pulse:    Resp:    Temp:    SpO2:      Last Pain:  Vitals:   07/09/20 0836  TempSrc:   PainSc: 0-No pain                 Clenton Esper

## 2020-07-09 NOTE — Op Note (Signed)
[facility] Gastroenterology Patient Name: Megan Jimenez Procedure Date: 07/09/2020 7:40 AM MRN: [id number] Account #: [id number] Date of Birth: 1965/08/11 Admit Type: Outpatient Age: 55 Room: [facility] ENDO ROOM 4 Gender: Female Note Status: Finalized Procedure:             Colonoscopy Indications:           Screening for colorectal malignant neoplasm Providers:             Jonathon Bellows MD, MD Referring MD:          No Local Md, MD (Referring MD) Medicines:             Monitored Anesthesia Care Complications:         No immediate complications. Procedure:             Pre-Anesthesia Assessment:                        - Prior to the procedure, a History and Physical was                         performed, and patient medications, allergies and                         sensitivities were reviewed. The patient's tolerance                         of previous anesthesia was reviewed.                        - The risks and benefits of the procedure and the                         sedation options and risks were discussed with the                         patient. All questions were answered and informed                         consent was obtained.                        - ASA Grade Assessment: II - A patient with mild                         systemic disease.                        After obtaining informed consent, the colonoscope was                         passed under direct vision. Throughout the procedure,                         the patient's blood pressure, pulse, and oxygen                         saturations were monitored continuously. The                         Colonoscope was introduced through the anus  and                         advanced to the the cecum, identified by the                         appendiceal orifice. The colonoscopy was performed                         with ease. The patient tolerated the procedure well.                         The quality of  the bowel preparation was good. Findings:      The perianal and digital rectal examinations were normal.      Multiple small-mouthed diverticula were found in the sigmoid colon.      A 3 mm polyp was found in the cecum. The polyp was sessile. The polyp       was removed with a cold biopsy forceps. Resection and retrieval were       complete.      A 5 mm polyp was found in the ascending colon. The polyp was sessile.       The polyp was removed with a cold snare. Resection and retrieval were       complete.      The exam was otherwise without abnormality on direct and retroflexion       views. Impression:            - Diverticulosis in the sigmoid colon.                        - One 3 mm polyp in the cecum, removed with a cold                         biopsy forceps. Resected and retrieved.                        - One 5 mm polyp in the ascending colon, removed with                         a cold snare. Resected and retrieved.                        - The examination was otherwise normal on direct and                         retroflexion views. Recommendation:        - Discharge patient to home (with escort).                        - Resume previous diet.                        - Continue present medications.                        - Await pathology results.                        - Repeat colonoscopy for surveillance based on  pathology results. Procedure Code(s):     --- Professional ---                        [phone], Colonoscopy, flexible; with removal of                         tumor(s), polyp(s), or other lesion(s) by snare                         technique                        45380, 67, Colonoscopy, flexible; with biopsy, single                         or multiple Diagnosis Code(s):     --- Professional ---                        Z12.11, Encounter for screening for malignant neoplasm                         of colon                        K63.5, Polyp of  colon                        K57.30, Diverticulosis of large intestine without                         perforation or abscess without bleeding CPT copyright 2019 American Medical Association. All rights reserved. The codes documented in this report are preliminary and upon coder review may  be revised to meet current compliance requirements. Jonathon Bellows, MD Jonathon Bellows MD, MD 07/09/2020 8:04:23 AM This report has been signed electronically. Number of Addenda: 0 Note Initiated On: 07/09/2020 7:40 AM Scope Withdrawal Time: 0 hours 11 minutes 17 seconds  Total Procedure Duration: 0 hours 15 minutes 40 seconds  Estimated Blood Loss:  Estimated blood loss: none.      [facility]

## 2020-07-09 NOTE — Anesthesia Preprocedure Evaluation (Signed)
Anesthesia Evaluation  Patient identified by MRN, date of birth, ID band Patient awake    Reviewed: Allergy & Precautions, NPO status , Patient's Chart, lab work & pertinent test results  Airway Mallampati: III       Dental   Pulmonary asthma , former smoker,    Pulmonary exam normal        Cardiovascular negative cardio ROS Normal cardiovascular exam     Neuro/Psych  Headaches, PSYCHIATRIC DISORDERS Anxiety Depression Bipolar Disorder    GI/Hepatic negative GI ROS, Neg liver ROS,   Endo/Other  negative endocrine ROS  Renal/GU negative Renal ROS  negative genitourinary   Musculoskeletal negative musculoskeletal ROS (+)   Abdominal Normal abdominal exam  (+)   Peds negative pediatric ROS (+)  Hematology negative hematology ROS (+)   Anesthesia Other Findings Past Medical History: No date: Asthma No date: Bipolar 1 disorder (HCC)  Reproductive/Obstetrics                             Anesthesia Physical Anesthesia Plan  ASA: II  Anesthesia Plan: General   Post-op Pain Management:    Induction: Intravenous  PONV Risk Score and Plan: Propofol infusion  Airway Management Planned: Nasal Cannula  Additional Equipment:   Intra-op Plan:   Post-operative Plan:   Informed Consent: I have reviewed the patients History and Physical, chart, labs and discussed the procedure including the risks, benefits and alternatives for the proposed anesthesia with the patient or authorized representative who has indicated his/her understanding and acceptance.     Dental advisory given  Plan Discussed with: CRNA and Surgeon  Anesthesia Plan Comments:         Anesthesia Quick Evaluation

## 2020-07-10 ENCOUNTER — Encounter: Payer: Self-pay | Admitting: Gastroenterology

## 2020-07-10 LAB — SURGICAL PATHOLOGY

## 2020-07-11 ENCOUNTER — Encounter: Payer: Self-pay | Admitting: Gastroenterology

## 2020-07-23 ENCOUNTER — Telehealth: Payer: Self-pay | Admitting: Family Medicine

## 2020-07-23 NOTE — Telephone Encounter (Signed)
Pt is applying for [place] patient assistant program and is waiting on application etc and would like sample of breo inhaler

## 2020-07-24 NOTE — Telephone Encounter (Signed)
Is it okay to give her if available?  Thanks,   -Mickel Baas

## 2020-07-25 NOTE — Telephone Encounter (Signed)
Accidentally sent to me  JB

## 2020-07-31 NOTE — Telephone Encounter (Signed)
Left message for patient advising of no samples available.

## 2020-08-02 ENCOUNTER — Other Ambulatory Visit: Payer: Self-pay | Admitting: Family Medicine

## 2020-08-02 DIAGNOSIS — J4541 Moderate persistent asthma with (acute) exacerbation: Secondary | ICD-10-CM

## 2020-08-02 MED ORDER — BREO ELLIPTA 200-25 MCG/INH IN AEPB: 1.0000 | INHALATION_SPRAY | Freq: Every day | RESPIRATORY_TRACT | 5 refills | Status: DC

## 2020-11-14 ENCOUNTER — Other Ambulatory Visit: Payer: Self-pay

## 2020-11-14 ENCOUNTER — Ambulatory Visit
Admission: RE | Admit: 2020-11-14 | Discharge: 2020-11-14 | Disposition: A | Discharge status: Home / Self Care | Payer: Self-pay | Source: Ambulatory Visit | Attending: Oncology | Admitting: Oncology

## 2020-11-14 ENCOUNTER — Ambulatory Visit: Payer: Self-pay | Attending: Oncology

## 2020-11-14 VITALS — BP 141/98 | HR 83 | Temp 97.3°F | Ht 68.0 in | Wt 272.0 lb

## 2020-11-14 DIAGNOSIS — Z9189 Other specified personal risk factors, not elsewhere classified: Secondary | ICD-10-CM

## 2020-11-14 DIAGNOSIS — Z0000 Encounter for general adult medical examination without abnormal findings: Secondary | ICD-10-CM | POA: Insufficient documentation

## 2020-11-14 NOTE — Progress Notes (Signed)
  Subjective:     Patient ID: Megan Jimenez, female   DOB: September 20, 1965, 55 y.o.   MRN: [id number]  HPI   Review of Systems     Objective:   Physical Exam Chest:  Breasts:    Right: No swelling, bleeding, inverted nipple, mass, nipple discharge, skin change or tenderness.     Left: No swelling, bleeding, inverted nipple, mass, nipple discharge, skin change or tenderness.       Assessment:     55 year old patient presents for [place] clinic visit Patient screened, and meets BCCCP eligibility.  Patient does not have insurance, Medicare or Medicaid.  Instructed patient on breast self awareness using teach back method .  Clinical breast exam unremarkable. No mass or lump palpated.  Risk Assessment     Risk Scores       11/14/2020   Last edited by: Rico Junker, RN   5-year risk: 0.9 %   Lifetime risk: 5.9 %               Plan:     Sent for bilateral screening mammogram.   Due to family history of cancer, and multiple breast cancers at a young age, referral through [place] made to [facility].

## 2020-11-20 NOTE — Progress Notes (Signed)
Letter mailed from [facility] to notify of normal mammogram results.  Patient to return in one year for annual screening.  Copy to [facility]. 

## 2020-11-27 ENCOUNTER — Inpatient Hospital Stay: Payer: Self-pay | Attending: Oncology | Admitting: Licensed Clinical Social Worker

## 2020-11-27 ENCOUNTER — Inpatient Hospital Stay: Payer: Self-pay

## 2020-11-27 DIAGNOSIS — Z801 Family history of malignant neoplasm of trachea, bronchus and lung: Secondary | ICD-10-CM

## 2020-11-27 DIAGNOSIS — Z803 Family history of malignant neoplasm of breast: Secondary | ICD-10-CM

## 2020-11-27 DIAGNOSIS — Z806 Family history of leukemia: Secondary | ICD-10-CM

## 2020-11-27 DIAGNOSIS — Z800 Family history of malignant neoplasm of digestive organs: Secondary | ICD-10-CM

## 2020-11-27 DIAGNOSIS — Z8041 Family history of malignant neoplasm of ovary: Secondary | ICD-10-CM

## 2020-11-28 ENCOUNTER — Encounter: Payer: Self-pay | Admitting: Licensed Clinical Social Worker

## 2020-11-28 DIAGNOSIS — Z806 Family history of leukemia: Secondary | ICD-10-CM | POA: Insufficient documentation

## 2020-11-28 DIAGNOSIS — Z801 Family history of malignant neoplasm of trachea, bronchus and lung: Secondary | ICD-10-CM | POA: Insufficient documentation

## 2020-11-28 DIAGNOSIS — Z800 Family history of malignant neoplasm of digestive organs: Secondary | ICD-10-CM | POA: Insufficient documentation

## 2020-11-28 DIAGNOSIS — Z803 Family history of malignant neoplasm of breast: Secondary | ICD-10-CM | POA: Insufficient documentation

## 2020-11-28 DIAGNOSIS — Z8041 Family history of malignant neoplasm of ovary: Secondary | ICD-10-CM | POA: Insufficient documentation

## 2020-11-28 NOTE — Progress Notes (Signed)
REFERRING PROVIDER: Lloyd Huger, MD [address] [place],  [place] [address]  PRIMARY PROVIDER:  [place]  PRIMARY REASON FOR VISIT:  1. Family history of breast cancer   2. Family history of ovarian cancer   3. Family history of colon cancer   4. Family history of throat cancer   5. Family history of lung cancer   6. Family history of leukemia      HISTORY OF PRESENT ILLNESS:   Megan Jimenez, a 55 y.o. female, was seen for a [place] cancer genetics consultation at the request of Dr. Grayland Ormond due to a family history of cancer.  [name] presents to clinic today to discuss the possibility of a hereditary predisposition to cancer, genetic testing, and to further clarify her future cancer risks, as well as potential cancer risks for family members.   [name] is a 55 y.o. female with no personal history of cancer.    CANCER HISTORY:  Oncology History   No history exists.     RISK FACTORS:  Menarche was at age 78.  First live birth at age 7.  OCP use for approximately  26  years.  Ovaries intact: yes.  Hysterectomy: no.  Menopausal status: postmenopausal.  HRT use: 0 years. Colonoscopy: yes;  few polyps . Mammogram within the last year: yes. Number of breast biopsies: 0. Up to date with pelvic exams: yes. Any excessive radiation exposure in the past: no  Past Medical History:  Diagnosis Date   Asthma    Bipolar 1 disorder ([place])    Family history of breast cancer    Family history of colon cancer    Family history of leukemia    Family history of lung cancer    Family history of ovarian cancer    Family history of throat cancer     Past Surgical History:  Procedure Laterality Date   ANKLE FRACTURE SURGERY Right 1992   CHOLECYSTECTOMY     COLONOSCOPY WITH PROPOFOL N/A 07/09/2020   Procedure: COLONOSCOPY WITH PROPOFOL;  Surgeon: Jonathon Bellows, MD;  Location[facility] ENDOSCOPY;  Service: Gastroenterology;  Laterality: N/A;    Social  History   Socioeconomic History   Marital status: Married    Spouse name: Not on file   Number of children: 2   Years of education: College   Highest education level: Not on file  Occupational History    Comment: Full-time  Tobacco Use   Smoking status: Former    Types: Cigarettes    Quit date: 03/17/2004    Years since quitting: 16.7   Smokeless tobacco: Never  Vaping Use   Vaping Use: Never used  Substance and Sexual Activity   Alcohol use: Yes    Comment: occ   Drug use: No   Sexual activity: Not on file  Other Topics Concern   Not on file  Social History Narrative   Lives at home with husband   Social Determinants of Health   Financial Resource Strain: Not on file  Food Insecurity: Not on file  Transportation Needs: Not on file  Physical Activity: Not on file  Stress: Not on file  Social Connections: Not on file     FAMILY HISTORY:  We obtained a detailed, 4-generation family history.  Significant diagnoses are listed below: Family History  Problem Relation Age of Onset   COPD Mother    Hyperlipidemia Mother    Lung cancer Mother 55   Hyperlipidemia Father    Leukemia  Father 31   COPD Father    Depression Sister        ANXIETY   Healthy Sister    Breast cancer Maternal Aunt        dx 11s, recurrence 72s   Throat cancer Maternal Uncle    Lung cancer Paternal Uncle    Colon cancer Maternal Grandmother    Ovarian cancer Cousin 69       paternal   Ovarian cancer Cousin 70   [name] has 2 sons, both 22, no cancers. She has 1 full sister and 1 maternal half sister, no cancers.  [name] mother died of lung cancer at 48, she did have history of smoking and was initially diagnosed at age 66. Patient had 4 maternal aunts, 2 maternal uncles. One aunt had breast cancer in her 9s and recurrence in her 30s. An uncle had throat cancer. Maternal grandmother had colon cancer and died at 37. Grandfather died in his 76s, no cancer.  [name] father had  [place] leukemia at 36 and died at 79. Patient had 9 paternal uncles and 1 aunt. Three uncles had lung cancer. One of them had a daughter who had ovarian cancer at 47 and passed from it. Another uncle also had a daughter with ovarian cancer, she died at 72. Maternal grandmother died at 6, grandfather died of pneumonia.   [name] is unaware of previous family history of genetic testing for hereditary cancer risks. Patient's maternal ancestors are of [place] descent, and paternal ancestors are of [place] descent. There is no reported Ashkenazi Jewish ancestry. There is no known consanguinity.    GENETIC COUNSELING ASSESSMENT: [name] is a 55 y.o. female with a family history of breast /ovarian cancer which is somewhat suggestive of a hereditary cancer syndrome and predisposition to cancer. We, therefore, discussed and recommended the following at today's visit.   DISCUSSION: We discussed that approximately 5-10% of breast cancer is hereditary. Most cases of hereditary breast and ovarian cancer are associated with BRCA1/BRCA2 genes, although there are other genes associated with hereditary cancer as well. Cancers and risks are gene specific.  We discussed that testing is beneficial for several reasons including knowing about other cancer risks, identifying potential screening and risk-reduction options that may be appropriate, and to understand if other family members could be at risk for cancer and allow them to undergo genetic testing.   We reviewed the characteristics, features and inheritance patterns of hereditary cancer syndromes. We also discussed genetic testing, including the appropriate family members to test, the process of testing, insurance coverage and turn-around-time for results. We discussed the implications of a negative, positive and/or variant of uncertain significant result. We recommended [name] pursue genetic testing for the [facility] CancerNext-Expanded+RNA gene panel.    The CancerNext-Expanded + RNAinsight gene panel offered by [employer] and includes sequencing and rearrangement analysis for the following 77 genes: IP, ALK, APC*, ATM*, AXIN2, BAP1, BARD1, BLM, BMPR1A, BRCA1*, BRCA2*, BRIP1*, CDC73, CDH1*,CDK4, CDKN1B, CDKN2A, CHEK2*, CTNNA1, DICER1, FANCC, FH, FLCN, GALNT12, KIF1B, LZTR1, MAX, MEN1, MET, MLH1*, MSH2*, MSH3, MSH6*, MUTYH*, NBN, NF1*, NF2, NTHL1, PALB2*, PHOX2B, PMS2*, POT1, PRKAR1A, PTCH1, PTEN*, RAD51C*, RAD51D*,RB1, RECQL, RET, SDHA, SDHAF2, SDHB, SDHC, SDHD, SMAD4, SMARCA4, SMARCB1, SMARCE1, STK11, SUFU, TMEM127, TP53*,TSC1, TSC2, VHL and XRCC2 (sequencing and deletion/duplication); EGFR, EGLN1, HOXB13, KIT, MITF, PDGFRA, POLD1 and POLE (sequencing only); EPCAM and GREM1 (deletion/duplication only).  Based on [name]'s family history of cancer, she meets medical criteria for genetic testing. Despite that she  meets criteria, she may still have an out of pocket cost. We discussed that if her out of pocket cost for testing is over $100, the laboratory will call and confirm whether she wants to proceed with testing.  If the out of pocket cost of testing is less than $100 she will be billed by the genetic testing laboratory.   PLAN: After considering the risks, benefits, and limitations, [name] provided informed consent to pursue genetic testing and the blood sample was sent to [facility] for analysis of the CancerNext-Expanded+RNA panel. Results should be available within approximately 2-3 weeks' time, at which point they will be disclosed by telephone to [name], as will any additional recommendations warranted by these results. [name] will receive a summary of her genetic counseling visit and a copy of her results once available. This information will also be available in Epic.   [name] questions were answered to her satisfaction today. Our contact information was provided should additional questions or concerns  arise. Thank you for the referral and allowing [place] to share in the care of your patient.   Faith Rogue, MS, [facility] Genetic Counselor [email or web address] Phone: [phone]  The patient was seen for a total of 50 minutes in face-to-face genetic counseling.  Patient was seen alone. [facility] intern Raymond Gurney was also present and assisted with this case. Dr. Grayland Ormond was available for discussion regarding this case.   _______________________________________________________________________ For Office Staff:  Number of people involved in session: 2 Was an Intern/ student involved with case: yes

## 2020-11-29 ENCOUNTER — Ambulatory Visit (INDEPENDENT_AMBULATORY_CARE_PROVIDER_SITE_OTHER): Payer: 59 | Admitting: Family Medicine

## 2020-11-29 ENCOUNTER — Other Ambulatory Visit: Payer: Self-pay

## 2020-11-29 ENCOUNTER — Ambulatory Visit: Payer: BC Managed Care – PPO | Admitting: Family Medicine

## 2020-11-29 ENCOUNTER — Encounter: Payer: Self-pay | Admitting: Family Medicine

## 2020-11-29 VITALS — BP 163/84 | HR 77 | Temp 98.7°F | Wt 275.0 lb

## 2020-11-29 DIAGNOSIS — M47814 Spondylosis without myelopathy or radiculopathy, thoracic region: Secondary | ICD-10-CM | POA: Diagnosis not present

## 2020-11-29 DIAGNOSIS — R29898 Other symptoms and signs involving the musculoskeletal system: Secondary | ICD-10-CM

## 2020-11-29 DIAGNOSIS — M47812 Spondylosis without myelopathy or radiculopathy, cervical region: Secondary | ICD-10-CM | POA: Insufficient documentation

## 2020-11-29 DIAGNOSIS — R269 Unspecified abnormalities of gait and mobility: Secondary | ICD-10-CM | POA: Insufficient documentation

## 2020-11-29 DIAGNOSIS — Z23 Encounter for immunization: Secondary | ICD-10-CM | POA: Insufficient documentation

## 2020-11-29 DIAGNOSIS — R202 Paresthesia of skin: Secondary | ICD-10-CM | POA: Insufficient documentation

## 2020-11-29 DIAGNOSIS — J301 Allergic rhinitis due to pollen: Secondary | ICD-10-CM

## 2020-11-29 DIAGNOSIS — J302 Other seasonal allergic rhinitis: Secondary | ICD-10-CM

## 2020-11-29 DIAGNOSIS — Z1159 Encounter for screening for other viral diseases: Secondary | ICD-10-CM

## 2020-11-29 DIAGNOSIS — M62830 Muscle spasm of back: Secondary | ICD-10-CM | POA: Insufficient documentation

## 2020-11-29 DIAGNOSIS — R200 Anesthesia of skin: Secondary | ICD-10-CM

## 2020-11-29 DIAGNOSIS — M4722 Other spondylosis with radiculopathy, cervical region: Secondary | ICD-10-CM | POA: Insufficient documentation

## 2020-11-29 DIAGNOSIS — J4520 Mild intermittent asthma, uncomplicated: Secondary | ICD-10-CM | POA: Insufficient documentation

## 2020-11-29 MED ORDER — CYCLOBENZAPRINE HCL 5 MG PO TABS: 5.0000 mg | ORAL_TABLET | Freq: Three times a day (TID) | ORAL | 1 refills | Status: DC | PRN

## 2020-11-29 MED ORDER — ALBUTEROL SULFATE HFA 108 (90 BASE) MCG/ACT IN AERS: 1.0000 | INHALATION_SPRAY | RESPIRATORY_TRACT | 6 refills | Status: DC | PRN

## 2020-11-29 MED ORDER — MONTELUKAST SODIUM 10 MG PO TABS: 10.0000 mg | ORAL_TABLET | Freq: Every day | ORAL | 3 refills | Status: DC

## 2020-11-29 MED ORDER — MELOXICAM 15 MG PO TABS: 15.0000 mg | ORAL_TABLET | Freq: Every day | ORAL | 2 refills | Status: DC

## 2020-11-29 NOTE — Assessment & Plan Note (Signed)
Refill of inhaler and singulair today Slight expiratory wheeze heard on exam Denies cough No worsening SOB

## 2020-11-29 NOTE — Assessment & Plan Note (Signed)
Tightness up and down back; has gotten new pillow but can just be slightly off and it 'acts up'

## 2020-11-29 NOTE — Assessment & Plan Note (Signed)
Unable to straddle seat of motorcycle Unable to lift one foot up then another when climbing stairs Knows that she is OW/obese- however, never this weak before

## 2020-11-29 NOTE — Assessment & Plan Note (Signed)
Slight shuffling gait s/s lower extremity weakness

## 2020-11-29 NOTE — Progress Notes (Signed)
Established patient visit   Patient: Megan Jimenez   DOB: [birth year]   55 y.o. Female  MRN: [id number] Visit Date: 11/29/2020  Today's healthcare provider: Gwyneth Sprout, FNP   Chief Complaint  Patient presents with   Anemia   Subjective    Back Pain This is a chronic problem. Associated symptoms include headaches, numbness and weakness. Pertinent negatives include no abdominal pain, fever or leg pain.  Neck Pain  This is a chronic problem. The problem has been gradually worsening. Quality: Sharp radiating. The pain is Same all the time. Associated symptoms include headaches, numbness and weakness. Pertinent negatives include no fever or leg pain. She has tried NSAIDs and acetaminophen for the symptoms. The treatment provided mild relief.   Pt would like a referral to Orthopedics.    Pt states she tried to donate plasma and was told her iron was too low.  She has been taking an iron supplement for years.  She started doubling up on it for a few weeks.      Medications: Outpatient Medications Prior to Visit  Medication Sig   albuterol (PROVENTIL) (2.5 MG/3ML) 0.083% nebulizer solution Take 3 mLs (2.5 mg total) by nebulization every 6 (six) hours as needed for wheezing or shortness of breath.   ALPRAZolam (XANAX) 1 MG tablet Take 1 mg by mouth 3 (three) times daily as needed for anxiety.   CAPLYTA 42 MG CAPS Take 1 capsule by mouth at bedtime.   Eszopiclone 3 MG TABS Take 1 tablet (3 mg total) by mouth at bedtime. Take immediately before bedtime   ferrous sulfate 325 (65 FE) MG tablet Take 325 mg by mouth daily with breakfast.   fluticasone (FLONASE) 50 MCG/ACT nasal spray Place 2 sprays into both nostrils daily.   fluticasone furoate-vilanterol (BREO ELLIPTA) 200-25 MCG/INH AEPB Inhale 1 puff into the lungs daily.   ipratropium-albuterol (DUONEB) 0.5-2.5 (3) MG/3ML SOLN Take 3 mLs by nebulization every 6 (six) hours as needed.   loratadine (CLARITIN) 10 MG tablet Take 10  mg by mouth daily as needed for allergies.   magnesium oxide (MAG-OX) 400 MG tablet Take 400 mg by mouth every evening.   Multiple Vitamin (MULTIVITAMIN WITH MINERALS) TABS tablet Take 1 tablet by mouth daily.   Vitamin D, Ergocalciferol, (DRISDOL) 50000 units CAPS capsule Take 50,000 Units by mouth every Friday.    [DISCONTINUED] albuterol (VENTOLIN HFA) 108 (90 Base) MCG/ACT inhaler TAKE 2 PUFFS BY MOUTH EVERY 4 HOURS AS NEEDED FOR COUGH OR WHEEZING   [DISCONTINUED] montelukast (SINGULAIR) 10 MG tablet TAKE 1 TABLET BY MOUTH EVERY DAY IN THE MORNING   No facility-administered medications prior to visit.    Review of Systems  Constitutional:  Positive for fatigue. Negative for activity change, appetite change, chills, diaphoresis, fever and unexpected weight change.  Respiratory:  Positive for shortness of breath and wheezing. Negative for apnea, cough, choking, chest tightness and stridor.   Gastrointestinal:  Positive for constipation and diarrhea. Negative for abdominal distention, abdominal pain, blood in stool, nausea, rectal pain and vomiting.  Musculoskeletal:  Positive for back pain, neck pain and neck stiffness.  Allergic/Immunologic: Positive for environmental allergies.  Neurological:  Positive for weakness, numbness and headaches.  Hematological:  Does not bruise/bleed easily.      Objective    BP (!) 163/84 (BP Location: Right Arm, Patient Position: Sitting, Cuff Size: Large)   Pulse 77   Temp 98.7 F (37.1 C) (Oral)   Wt 275 lb (124.7  kg)   LMP 09/28/2018   SpO2 98%   BMI 41.81 kg/m    Physical Exam Vitals and nursing note reviewed.  Constitutional:      General: She is not in acute distress.    Appearance: Normal appearance. She is obese. She is not ill-appearing, toxic-appearing or diaphoretic.  HENT:     Head: Normocephalic and atraumatic.  Cardiovascular:     Rate and Rhythm: Normal rate and regular rhythm.     Pulses: Normal pulses.     Heart sounds:  Normal heart sounds. No murmur heard.   No friction rub. No gallop.  Pulmonary:     Effort: Pulmonary effort is normal. Prolonged expiration present. No respiratory distress.     Breath sounds: Decreased air movement present. No stridor. Examination of the right-upper field reveals wheezing. Examination of the left-upper field reveals wheezing. Examination of the right-middle field reveals wheezing. Examination of the left-middle field reveals wheezing. Examination of the right-lower field reveals decreased breath sounds. Examination of the left-lower field reveals decreased breath sounds. Decreased breath sounds and wheezing present. No rhonchi or rales.  Chest:     Chest wall: No tenderness.  Abdominal:     General: Bowel sounds are normal. There is no distension.     Palpations: Abdomen is soft.     Tenderness: There is no abdominal tenderness. There is no guarding or rebound.  Musculoskeletal:        General: No swelling, deformity or signs of injury.     Cervical back: Tenderness and crepitus present. Pain with movement, spinous process tenderness and muscular tenderness present. Decreased range of motion.     Right lower leg: No edema.     Left lower leg: No edema.  Skin:    General: Skin is warm and dry.     Capillary Refill: Capillary refill takes less than 2 seconds.     Coloration: Skin is not jaundiced or pale.     Findings: No bruising, erythema, lesion or rash.  Neurological:     General: No focal deficit present.     Mental Status: She is alert and oriented to person, place, and time. Mental status is at baseline.     Cranial Nerves: No cranial nerve deficit.     Sensory: No sensory deficit.     Motor: No weakness.     Coordination: Coordination normal.  Psychiatric:        Mood and Affect: Mood normal.        Behavior: Behavior normal.        Thought Content: Thought content normal.        Judgment: Judgment normal.     No results found for any visits on 11/29/20.   Assessment & Plan     Problem List Items Addressed This Visit       Respiratory   Allergic rhinitis    Refill of inhaler and singulair today Slight expiratory wheeze heard on exam Denies cough No worsening SOB      Relevant Medications   montelukast (SINGULAIR) 10 MG tablet   Mild intermittent asthma without complication   Relevant Medications   montelukast (SINGULAIR) 10 MG tablet   albuterol (VENTOLIN HFA) 108 (90 Base) MCG/ACT inhaler     Nervous and Auditory   Weakness of both lower extremities    Unable to straddle seat of motorcycle Unable to lift one foot up then another when climbing stairs Knows that she is OW/obese- however, never this weak before  Relevant Orders   Sed Rate (ESR)   C-reactive protein   Ambulatory referral to Orthopedic Surgery     Musculoskeletal and Integument   Osteoarthritis of cervical spine - Primary    Feels/sounds like "walking on gravel" when I turn my head Limited ROM      Relevant Medications   meloxicam (MOBIC) 15 MG tablet   cyclobenzaprine (FLEXERIL) 5 MG tablet   Other Relevant Orders   Ambulatory referral to Neurosurgery   Ambulatory referral to Orthopedic Surgery   Osteoarthritis of thoracic spine    Pain advances from neck to back; worse when waking up or in certain positions Request for referral      Relevant Medications   meloxicam (MOBIC) 15 MG tablet   cyclobenzaprine (FLEXERIL) 5 MG tablet   Other Relevant Orders   Ambulatory referral to Neurosurgery   Ambulatory referral to Orthopedic Surgery     Other   Abnormal gait    Slight shuffling gait s/s lower extremity weakness      Relevant Orders   Ambulatory referral to Neurosurgery   CBC with Differential/Platelet   Comprehensive metabolic panel   TSH   Vitamin B12   VITAMIN D 25 Hydroxy (Vit-D Deficiency, Fractures)   Hemoglobin A1c   Iron, TIBC and Ferritin Panel   Sed Rate (ESR)   C-reactive protein   Numbness and tingling    Primarily in  upper body; L>R however, intermittent in lower body as well      Relevant Orders   Ambulatory referral to Neurosurgery   CBC with Differential/Platelet   Comprehensive metabolic panel   TSH   Vitamin B12   VITAMIN D 25 Hydroxy (Vit-D Deficiency, Fractures)   Hemoglobin A1c   Iron, TIBC and Ferritin Panel   Sed Rate (ESR)   C-reactive protein   Need for shingles vaccine    Given today; second due in 2-6 months      Relevant Orders   Varicella-zoster vaccine IM (Shingrix) (Completed)   Need for hepatitis C screening test    Low risk screening done today      Relevant Orders   Hepatitis C Antibody   Muscle spasm of back    Tightness up and down back; has gotten new pillow but can just be slightly off and it 'acts up'      Relevant Medications   cyclobenzaprine (FLEXERIL) 5 MG tablet     Return in about 6 months (around 05/29/2021) for chonic disease management.      Vonna Kotyk, FNP, have reviewed all documentation for this visit. The documentation on 11/29/20 for the exam, diagnosis, procedures, and orders are all accurate and complete.    Gwyneth Sprout, [place] [phone] (phone) [phone] (fax)  [place]

## 2020-11-29 NOTE — Assessment & Plan Note (Signed)
Low risk screening done today

## 2020-11-29 NOTE — Assessment & Plan Note (Signed)
Given today; second due in 2-6 months

## 2020-11-29 NOTE — Assessment & Plan Note (Signed)
Pain advances from neck to back; worse when waking up or in certain positions Request for referral

## 2020-11-29 NOTE — Assessment & Plan Note (Signed)
Feels/sounds like "walking on gravel" when I turn my head Limited ROM

## 2020-11-29 NOTE — Assessment & Plan Note (Signed)
Primarily in upper body; L>R however, intermittent in lower body as well

## 2020-11-30 LAB — COMPREHENSIVE METABOLIC PANEL
ALT: 19 IU/L (ref 0–32)
AST: 13 IU/L (ref 0–40)
Albumin/Globulin Ratio: 2.1 (ref 1.2–2.2)
Albumin: 4 g/dL (ref 3.8–4.9)
Alkaline Phosphatase: 66 IU/L (ref 44–121)
BUN/Creatinine Ratio: 40 — ABNORMAL HIGH (ref 9–23)
BUN: 21 mg/dL (ref 6–24)
Bilirubin Total: <0.2 mg/dL (ref 0.0–1.2)
CO2: 27 mmol/L (ref 20–29)
Calcium: 9.4 mg/dL (ref 8.7–10.2)
Chloride: 103 mmol/L (ref 96–106)
Creatinine, Ser: 0.53 mg/dL — ABNORMAL LOW (ref 0.57–1.00)
Globulin, Total: 1.9 g/dL (ref 1.5–4.5)
Glucose: 107 mg/dL — ABNORMAL HIGH (ref 65–99)
Potassium: 4.6 mmol/L (ref 3.5–5.2)
Sodium: 142 mmol/L (ref 134–144)
Total Protein: 5.9 g/dL — ABNORMAL LOW (ref 6.0–8.5)
eGFR: 108 mL/min/{1.73_m2} (ref >59)

## 2020-11-30 LAB — IRON,TIBC AND FERRITIN PANEL
Ferritin: 125 ng/mL (ref 15–150)
Iron Saturation: 19 % (ref 15–55)
Iron: 64 ug/dL (ref 27–159)
Total Iron Binding Capacity: 337 ug/dL (ref 250–450)
UIBC: 273 ug/dL (ref 131–425)

## 2020-11-30 LAB — CBC WITH DIFFERENTIAL/PLATELET
Basophils Absolute: 0 10*3/uL (ref 0.0–0.2)
Basos: 1 %
EOS (ABSOLUTE): 0.2 10*3/uL (ref 0.0–0.4)
Eos: 3 %
Hematocrit: 36.4 % (ref 34.0–46.6)
Hemoglobin: 12.3 g/dL (ref 11.1–15.9)
Immature Grans (Abs): 0 10*3/uL (ref 0.0–0.1)
Immature Granulocytes: 0 %
Lymphocytes Absolute: 1.3 10*3/uL (ref 0.7–3.1)
Lymphs: 20 %
MCH: 30.8 pg (ref 26.6–33.0)
MCHC: 33.8 g/dL (ref 31.5–35.7)
MCV: 91 fL (ref 79–97)
Monocytes Absolute: 0.6 10*3/uL (ref 0.1–0.9)
Monocytes: 9 %
Neutrophils Absolute: 4.3 10*3/uL (ref 1.4–7.0)
Neutrophils: 67 %
Platelets: 208 10*3/uL (ref 150–450)
RBC: 4 x10E6/uL (ref 3.77–5.28)
RDW: 11.9 % (ref 11.7–15.4)
WBC: 6.3 10*3/uL (ref 3.4–10.8)

## 2020-11-30 LAB — TSH: TSH: 0.764 u[IU]/mL (ref 0.450–4.500)

## 2020-11-30 LAB — HEMOGLOBIN A1C
Est. average glucose Bld gHb Est-mCnc: 120 mg/dL
Hgb A1c MFr Bld: 5.8 % — ABNORMAL HIGH (ref 4.8–5.6)

## 2020-11-30 LAB — HEPATITIS C ANTIBODY: Hep C Virus Ab: <0.1 s/co ratio (ref 0.0–0.9)

## 2020-11-30 LAB — SEDIMENTATION RATE: Sed Rate: 4 mm/hr (ref 0–40)

## 2020-11-30 LAB — VITAMIN B12: Vitamin B-12: 440 pg/mL (ref 232–1245)

## 2020-11-30 LAB — VITAMIN D 25 HYDROXY (VIT D DEFICIENCY, FRACTURES): Vit D, 25-Hydroxy: 41.3 ng/mL (ref 30.0–100.0)

## 2020-11-30 LAB — C-REACTIVE PROTEIN: CRP: 4 mg/L (ref 0–10)

## 2020-12-03 ENCOUNTER — Telehealth: Payer: Self-pay

## 2020-12-03 ENCOUNTER — Encounter: Payer: Self-pay | Admitting: Family Medicine

## 2020-12-03 NOTE — Telephone Encounter (Signed)
Copy of labs has been faxed to Dr. Toy Care

## 2020-12-03 NOTE — Telephone Encounter (Signed)
Copied from [place] [id number]. Topic: General - Inquiry >> Dec 03, 2020  2:12 PM [name] wrote: Reason for CRM: pt needs her most recent lab results (9/15) faxed to her psychiatrist.  Dr Chucky May Fax [phone]  .she states the dr needs labs results directly from her dr office.  Dr does not except email.  Pt states she was referred to [name] in 2006 from your office.

## 2020-12-03 NOTE — Telephone Encounter (Signed)
Note Copied from [place] [id number]. Topic: General - Inquiry >> Dec 03, 2020  2:12 PM [name] wrote: Reason for CRM: pt needs her most recent lab results (9/15) faxed to her psychiatrist.  Dr Chucky May Fax [phone]  .she states the dr needs labs results directly from her dr office.  Dr does not except email.  Pt states she was referred to [name] in 2006 from your office.

## 2020-12-06 ENCOUNTER — Other Ambulatory Visit: Payer: Self-pay | Admitting: Physician Assistant

## 2020-12-06 DIAGNOSIS — G479 Sleep disorder, unspecified: Secondary | ICD-10-CM

## 2020-12-10 ENCOUNTER — Telehealth: Payer: Self-pay

## 2020-12-10 NOTE — Telephone Encounter (Signed)
Pt calling in regarding this message. She states that she has concerns regarding her lab results and is requesting to have nurse follow up with her to discuss. Please advise.

## 2020-12-10 NOTE — Telephone Encounter (Signed)
Copied from [place] [id number]. Topic: Referral - Request for Referral >> Dec 10, 2020  2:10 PM Celene Kras wrote: Has patient seen PCP for this complaint? Yes.   *If NO, is insurance requiring patient see PCP for this issue before PCP can refer them? Referral for which specialty: Neurology Preferred provider/office: N/A Reason for referral: Pt was referred to two separate neurosurgery referrals and they do not accept her insurance. She states that the orthopedic will not see her until she is able to have an appt with Neurology. Please advise. >> Dec 10, 2020  2:49 PM Holley Dexter N wrote: Pt called back in wanting to give information on a office she found, pt wants a referral sent over to [facility], in [place] [phone] >> Dec 10, 2020  2:46 PM Georgina Peer, [place] wrote: This is not a [facility] patient.

## 2020-12-10 NOTE — Telephone Encounter (Signed)
Please review. KW 

## 2020-12-11 ENCOUNTER — Other Ambulatory Visit: Payer: Self-pay | Admitting: Family Medicine

## 2020-12-11 DIAGNOSIS — G479 Sleep disorder, unspecified: Secondary | ICD-10-CM

## 2020-12-11 NOTE — Telephone Encounter (Signed)
Medication Refill - Medication:  Eszopiclone 3 MG TABS   Has the patient contacted their pharmacy? Yes.   Contact pcp  Preferred Pharmacy (with phone number or street name):  [id number] - [place], [place] - [address]  [address], [place] [address]  Phone:  [phone]  Fax:  [phone]  Has the patient been seen for an appointment in the last year OR does the patient have an upcoming appointment? Yes.    Agent: Please be advised that RX refills may take up to 3 business days. We ask that you follow-up with your pharmacy.

## 2020-12-11 NOTE — Telephone Encounter (Signed)
Requested medications are due for refill today.  yes  Requested medications are on the active medications list.  yes  Last refill. 05/24/2020  Future visit scheduled.   yes  Notes to clinic.  Medication not delegated.

## 2020-12-12 ENCOUNTER — Ambulatory Visit: Payer: Self-pay | Admitting: Licensed Clinical Social Worker

## 2020-12-12 ENCOUNTER — Encounter: Payer: Self-pay | Admitting: Licensed Clinical Social Worker

## 2020-12-12 ENCOUNTER — Telehealth: Payer: Self-pay | Admitting: Licensed Clinical Social Worker

## 2020-12-12 DIAGNOSIS — Z1379 Encounter for other screening for genetic and chromosomal anomalies: Secondary | ICD-10-CM

## 2020-12-12 DIAGNOSIS — Z800 Family history of malignant neoplasm of digestive organs: Secondary | ICD-10-CM

## 2020-12-12 DIAGNOSIS — Z8041 Family history of malignant neoplasm of ovary: Secondary | ICD-10-CM

## 2020-12-12 DIAGNOSIS — Z803 Family history of malignant neoplasm of breast: Secondary | ICD-10-CM

## 2020-12-12 DIAGNOSIS — Z801 Family history of malignant neoplasm of trachea, bronchus and lung: Secondary | ICD-10-CM

## 2020-12-12 NOTE — Progress Notes (Signed)
HPI:  Ms. Birks was previously seen in the [place] clinic due to a family history of cancer and concerns regarding a hereditary predisposition to cancer. Please refer to our prior cancer genetics clinic note for more information regarding our discussion, assessment and recommendations, at the time. Ms. Ikner recent genetic test results were disclosed to her, as were recommendations warranted by these results. These results and recommendations are discussed in more detail below.  CANCER HISTORY:  Oncology History   No history exists.    FAMILY HISTORY:  We obtained a detailed, 4-generation family history.  Significant diagnoses are listed below: Family History  Problem Relation Age of Onset   COPD Mother    Hyperlipidemia Mother    Lung cancer Mother 61   Hyperlipidemia Father    Leukemia Father 49   COPD Father    Depression Sister        ANXIETY   Healthy Sister    Breast cancer Maternal Aunt        dx 7s, recurrence 56s   Throat cancer Maternal Uncle    Lung cancer Paternal Uncle    Colon cancer Maternal Grandmother    Ovarian cancer Cousin 41       paternal   Ovarian cancer Cousin 31   Ms. Goslin has 2 sons, both 44, no cancers. She has 1 full sister and 1 maternal half sister, no cancers.   Ms. Brafford mother died of lung cancer at 14, she did have history of smoking and was initially diagnosed at age 57. Patient had 4 maternal aunts, 2 maternal uncles. One aunt had breast cancer in her 36s and recurrence in her 28s. An uncle had throat cancer. Maternal grandmother had colon cancer and died at 36. Grandfather died in his 69s, no cancer.   Ms. Heaton father had Waldenstrom leukemia at 59 and died at 46. Patient had 9 paternal uncles and 1 aunt. Three uncles had lung cancer. One of them had a daughter who had ovarian cancer at 10 and passed from it. Another uncle also had a daughter with ovarian cancer, she died at 24. Maternal grandmother died at  29, grandfather died of pneumonia.    Ms. Manka is unaware of previous family history of genetic testing for hereditary cancer risks. Patient's maternal ancestors are of [place] descent, and paternal ancestors are of [place] descent. There is no reported Ashkenazi Jewish ancestry. There is no known consanguinity.       GENETIC TEST RESULTS: Genetic testing reported out on 12/11/2020 through the [employer] CancerNext-Expanded+RNA cancer panel found no pathogenic mutations.   The CancerNext-Expanded + RNAinsight gene panel offered by [employer] and includes sequencing and rearrangement analysis for the following 77 genes: IP, ALK, APC*, ATM*, AXIN2, BAP1, BARD1, BLM, BMPR1A, BRCA1*, BRCA2*, BRIP1*, CDC73, CDH1*,CDK4, CDKN1B, CDKN2A, CHEK2*, CTNNA1, DICER1, FANCC, FH, FLCN, GALNT12, KIF1B, LZTR1, MAX, MEN1, MET, MLH1*, MSH2*, MSH3, MSH6*, MUTYH*, NBN, NF1*, NF2, NTHL1, PALB2*, PHOX2B, PMS2*, POT1, PRKAR1A, PTCH1, PTEN*, RAD51C*, RAD51D*,RB1, RECQL, RET, SDHA, SDHAF2, SDHB, SDHC, SDHD, SMAD4, SMARCA4, SMARCB1, SMARCE1, STK11, SUFU, TMEM127, TP53*,TSC1, TSC2, VHL and XRCC2 (sequencing and deletion/duplication); EGFR, EGLN1, HOXB13, KIT, MITF, PDGFRA, POLD1 and POLE (sequencing only); EPCAM and GREM1 (deletion/duplication only).   The test report has been scanned into EPIC and is located under the Molecular Pathology section of the Results Review tab.  A portion of the result report is included below for reference.     We discussed that because current genetic testing is not perfect,  it is possible there may be a gene mutation in one of these genes that current testing cannot detect, but that chance is small.  There could be another gene that has not yet been discovered, or that we have not yet tested, that is responsible for the cancer diagnoses in the family. It is also possible there is a hereditary cause for the cancer in the family that Ms. Guedes did not inherit and therefore was not identified in  her testing.  Therefore, it is important to remain in touch with cancer genetics in the future so that we can continue to offer Ms. Virgo the most up to date genetic testing.   ADDITIONAL GENETIC TESTING: We discussed with Ms. Strider that her genetic testing was fairly extensive.  If there are genes identified to increase cancer risk that can be analyzed in the future, we would be happy to discuss and coordinate this testing at that time.    CANCER SCREENING RECOMMENDATIONS: Ms. Foglio test result is considered negative (normal).  This means that we have not identified a hereditary cause for her  family history of cancer at this time.  While reassuring, this does not definitively rule out a hereditary predisposition to cancer. It is still possible that there could be genetic mutations that are undetectable by current technology. There could be genetic mutations in genes that have not been tested or identified to increase cancer risk.  Therefore, it is recommended she continue to follow the cancer management and screening guidelines provided by her primary healthcare provider.   An individual's cancer risk and medical management are not determined by genetic test results alone. Overall cancer risk assessment incorporates additional factors, including personal medical history, family history, and any available genetic information that may result in a personalized plan for cancer prevention and surveillance.   Based on Ms. Stroupe's personal and family history of cancer as well as her genetic test results, risk model Harriett Rush was used to estimate her risk of developing breast cancer. This estimates her lifetime risk of developing breast cancer to be approximately 8%.  The patient's lifetime breast cancer risk is a preliminary estimate based on available information using one of several models endorsed by the [place] (ACS). The ACS recommends consideration of breast MRI screening as  an adjunct to mammography for patients at high risk (defined as 20% or greater lifetime risk).  This risk estimate can change over time and may be repeated to reflect new information in her personal or family history in the future.    RECOMMENDATIONS FOR FAMILY MEMBERS:  Relatives in this family might be at some increased risk of developing cancer, over the general population risk, simply due to the family history of cancer.  We recommended female relatives in this family have a yearly mammogram beginning at age 73, or 30 years younger than the earliest onset of cancer, an annual clinical breast exam, and perform monthly breast self-exams. Female relatives in this family should also have a gynecological exam as recommended by their primary provider.  All family members should be referred for colonoscopy starting at age 72.    It is also possible there is a hereditary cause for the cancer in Ms. Bishop's family that she did not inherit and therefore was not identified in her.  Based on Ms. Mitchelle's family history, we recommended maternal and paternal relatives, especially those who had cancer/closely related to those with breast/ovarian cancer have genetic counseling and testing. Ms. Pfeifer will let  us know if we can be of any assistance in coordinating genetic counseling and/or testing for these family members.  FOLLOW-UP: Lastly, we discussed with Ms. Roeder that cancer genetics is a rapidly advancing field and it is possible that new genetic tests will be appropriate for her and/or her family members in the future. We encouraged her to remain in contact with cancer genetics on an annual basis so we can update her personal and family histories and let her know of advances in cancer genetics that may benefit this family.   Our contact number was provided. Ms. Schnake questions were answered to her satisfaction, and she knows she is welcome to call us at anytime with additional questions or concerns.    Faith Rogue, MS, [facility] Genetic Counselor [email or web address] Phone: [phone]

## 2020-12-12 NOTE — Telephone Encounter (Signed)
Type Date User Summary Attachment  General 12/04/2020 11:24 AM [name] - -  Note   [facility] does not take pt's insurance. Referral sent to [facility]        Referral has been sent to [facility] as of 9/20 since [facility] could not see patient, a new referral wouldn't be needed. [name] KW

## 2020-12-12 NOTE — Telephone Encounter (Signed)
Revealed negative genetic testing.  This normal result is reassuring.  It is unlikely that there is an increased risk of cancer due to a mutation in one of these genes.  However, genetic testing is not perfect, and cannot definitively rule out a hereditary cause.  It will be important for her to keep in contact with genetics to learn if any additional testing may be needed in the future.      

## 2020-12-13 MED ORDER — ESZOPICLONE 3 MG PO TABS: 3.0000 mg | ORAL_TABLET | Freq: Every day | ORAL | 0 refills | Status: DC

## 2020-12-14 ENCOUNTER — Other Ambulatory Visit: Payer: Self-pay | Admitting: Family Medicine

## 2020-12-14 DIAGNOSIS — G479 Sleep disorder, unspecified: Secondary | ICD-10-CM

## 2020-12-14 MED ORDER — ESZOPICLONE 3 MG PO TABS: 3.0000 mg | ORAL_TABLET | Freq: Every day | ORAL | 0 refills | Status: DC

## 2020-12-17 ENCOUNTER — Other Ambulatory Visit: Payer: Self-pay | Admitting: Physician Assistant

## 2020-12-17 DIAGNOSIS — G479 Sleep disorder, unspecified: Secondary | ICD-10-CM

## 2020-12-18 NOTE — Telephone Encounter (Signed)
Pt called in regards to this medication. Prescription states "Transmission to pharmacy failed". Pt requesting to have this prescription sent over again. Please advise.

## 2020-12-18 NOTE — Telephone Encounter (Signed)
Pt calling stating that she contacted her insurance company and they stated that [facility] with Dr. Claire Shown in Neurological Surgery would be in network with pt. She is requesting to have the referral sent here asap. Please advise.    Phone # [phone]     Pt is also requesting to have a nurse give her a call back in regards to her lab results and the questions she has.

## 2020-12-19 NOTE — Telephone Encounter (Signed)
Type Date User Summary Attachment  General 12/13/2020 11:33 AM [name] - -  Note   Referral sent to [facility]at pt's request Phone: [phone] Fax [phone]

## 2020-12-31 NOTE — Telephone Encounter (Signed)
Pt called in and stated [name] still has not received a copy of her lab results, please advise.

## 2020-12-31 NOTE — Telephone Encounter (Signed)
Will refax labs again to Dr. Toy Care at phone number provided below. KW

## 2021-01-02 ENCOUNTER — Other Ambulatory Visit: Payer: Self-pay

## 2021-01-02 ENCOUNTER — Ambulatory Visit: Payer: Self-pay

## 2021-01-02 ENCOUNTER — Encounter: Payer: Self-pay | Admitting: Surgery

## 2021-01-02 ENCOUNTER — Ambulatory Visit (INDEPENDENT_AMBULATORY_CARE_PROVIDER_SITE_OTHER): Payer: 59 | Admitting: Surgery

## 2021-01-02 VITALS — BP 139/85 | HR 75 | Ht 68.0 in | Wt 275.0 lb

## 2021-01-02 DIAGNOSIS — G8929 Other chronic pain: Secondary | ICD-10-CM

## 2021-01-02 DIAGNOSIS — M4722 Other spondylosis with radiculopathy, cervical region: Secondary | ICD-10-CM

## 2021-01-02 DIAGNOSIS — M4726 Other spondylosis with radiculopathy, lumbar region: Secondary | ICD-10-CM

## 2021-01-02 DIAGNOSIS — M5442 Lumbago with sciatica, left side: Secondary | ICD-10-CM

## 2021-01-02 DIAGNOSIS — M542 Cervicalgia: Secondary | ICD-10-CM | POA: Diagnosis not present

## 2021-01-02 DIAGNOSIS — R2681 Unsteadiness on feet: Secondary | ICD-10-CM

## 2021-01-02 DIAGNOSIS — M5441 Lumbago with sciatica, right side: Secondary | ICD-10-CM

## 2021-01-02 NOTE — Progress Notes (Signed)
Office Visit Note   Patient: Megan Jimenez           Date of Birth: [birth year]           MRN: [id number] Visit Date: 01/02/2021              Requested by: Gwyneth Sprout, [address],  [place] [address] PCP: Gwyneth Sprout, FNP   Assessment & Plan: Visit Diagnoses:  1. Chronic low back pain with bilateral sciatica, unspecified back pain laterality   2. Neck pain   3. Other spondylosis with radiculopathy, cervical region   4. Other spondylosis with radiculopathy, lumbar region   5. Unsteady gait when walking     Plan: With patient's issue with chronic neck pain, cervical spondylosis and worsening unsteady gait over the last 6 months recommend getting stat MRI scans of the cervical and lumbar spine.  Must rule out cervical myelopathy.  I will have patient return back to see Dr. Louanne Skye to discuss results and further treatment options.  I made her appointment for 3 weeks but she will contact me when the scans are complete and I will see about getting her into see them sooner.  All questions answered.  I advised patient that she should be careful until we get the studies.  Follow-Up Instructions: Return in about 3 weeks (around 01/23/2021) for WITH DR NITKA TO REVIEW CERVICAL AND LUMBAR MRI SCAN.   Orders:  Orders Placed This Encounter  Procedures   XR Cervical Spine 2 or 3 views   XR Lumbar Spine 2-3 Views   MR Cervical Spine w/o contrast   MR Lumbar Spine w/o contrast   No orders of the defined types were placed in this encounter.     Procedures: No procedures performed   Clinical Data: No additional findings.   Subjective: Chief Complaint  Patient presents with   Neck - Pain   Lower Back - Pain    HPI 55 year old white female who is new patient to clinic comes in today with complaints of chronic neck pain, low back pain and unsteady gait and feeling of leg weakness. Patient states that she has had off-and-on problems with her neck and left upper  extremity radiculopathy dating back to 2017.  Patient states that she suffered a fall and eventually was seen by Dr. Saintclair Halsted neurosurgeon.  States that after the fall she felt like her "left arm was paralyzed". She did have cervical MRI April 09, 2015 and that study showed:  CLINICAL DATA:  Left-sided neck pain and left arm weakness secondary to a fall down stairs yesterday.   EXAM: MRI CERVICAL SPINE WITHOUT CONTRAST   TECHNIQUE: Multiplanar, multisequence MR imaging of the cervical spine was performed. No intravenous contrast was administered.   COMPARISON:  CT scan of the cervical spine dated 04/09/2015   FINDINGS: The scans are degraded by motion artifact.   The visualized intracranial contents are normal. Cervical spinal cord appears normal with no mass lesion or myelopathy or spinal cord compression.   There is abnormal prevertebral fluid extending from C2-3 to the inferior aspect of C4 anteriorly. There is no subluxation.   Craniocervical junction through C2-3: Slight bilateral facet arthritis at C2-3. Otherwise normal.   C3-4: Small broad-based disc bulge with moderate left facet arthritis and moderate left foraminal stenosis. No bone edema.   C4-5: Disc space narrowing with a small broad-based disc bulge with accompanying osteophytes with minimal left foraminal narrowing.   C5-6: Chronic disc space  narrowing with a broad-based disc osteophyte complex with moderate right and severe left foraminal stenosis. The osteophytes protrude into both lateral recesses.   C6-7: Disc space narrowing with disc osteophyte complex protruding into both lateral recesses. No significant foraminal stenosis.   C7-T1 2 mm anterolisthesis due to moderate bilateral facet arthritis. The disc is normal. No foraminal stenosis.   IMPRESSION: 1. Abnormal prevertebral soft tissue edema extending from C2-3 to the inferior aspect of C4 anteriorly. No appreciable fracture or posterior soft  tissue edema. However, this suggests ligamentous injury anteriorly. 2. Multilevel degenerative disc disease with foraminal stenoses, severe at C4-5 on the left. 3. Normal appearing cervical spinal cord. Critical Value/emergent results were called by telephone at the time of interpretation on 04/09/2015 at 11:31 am to Dr. Carrie Mew , who verbally acknowledged these results.     Electronically Signed   By: Lorriane Shire M.D.   On: 04/09/2015 11:32  She states that Dr. Saintclair Halsted recommended conservative treatment and surgery is not indicated at that time.  States that he advised her that sometime down the line it may come down to needing something done with her neck if things worsened.  Patient has not been back to see him since she was released.  States that she has continued to have chronic neck pain and left upper extremity colopathy with feeling of left arm weakness.  This is been worse over the last 6 months.  No right arm symptoms.  Also states she is had chronic low back pain x2 years but this is been worse over the last year.  She has episodes of shooting pain into the right buttock down her leg.  States over the last 6 months she is also had bilateral leg weakness and unsteady gait.  States that she "feels wobbly".  No problems with this before 6 months ago.  She was recently seen by her PCP and referral was made to see [place].   Review of Systems No current cardiac pulmonary GI GU issues  Objective: Vital Signs: BP 139/85   Pulse 75   Ht 5\' 8"  (1.727 m)   Wt 275 lb (124.7 kg)   LMP 09/28/2018   BMI 41.81 kg/m   Physical Exam Constitutional:      Appearance: She is obese.  HENT:     Head: Normocephalic.     Nose: Nose normal.  Pulmonary:     Effort: Pulmonary effort is normal. No respiratory distress.  Musculoskeletal:     Comments: Positive bilateral brachial plexus trapezius and scapular tenderness.  Left shoulder mildly positive impingement test.  Good shoulder range of  motion.  Negative drop arm test.  Right shoulder unremarkable.  Positive bilateral lumbar paraspinal tenderness.  Positive bilateral SI joint tenderness.  Mild tenderness over the bilateral greater trochanter bursa.  Positive left straight leg raise.  Negative on the right.  No lower extremity weakness.  No right upper extremity weakness.  Neurological:     Mental Status: She is alert and oriented to person, place, and time.     Motor: Weakness (Patient has trace left biceps, triceps, grip, wrist flexion and extension weakness.) present.    Ortho Exam  Specialty Comments:  No specialty comments available.  Imaging: No results found.   PMFS History: Patient Active Problem List   Diagnosis Date Noted   Genetic testing 12/12/2020   Osteoarthritis of cervical spine 11/29/2020   Osteoarthritis of thoracic spine 11/29/2020   Abnormal gait 11/29/2020   Numbness and tingling 11/29/2020  Need for shingles vaccine 11/29/2020   Need for hepatitis C screening test 11/29/2020   Weakness of both lower extremities 11/29/2020   Muscle spasm of back 11/29/2020   Mild intermittent asthma without complication 16/12/9602   Family history of breast cancer 11/28/2020   Family history of ovarian cancer 11/28/2020   Family history of colon cancer 11/28/2020   Family history of throat cancer 11/28/2020   Family history of lung cancer 11/28/2020   Family history of leukemia 11/28/2020   Allergic rhinitis 11/17/2014   Anxiety 09/08/2014   Acute asthma exacerbation 09/08/2014   Affective bipolar disorder (Normal) 09/08/2014   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/08/2014   Headache, migraine 09/08/2014   Excess, menstruation 09/08/2014   Past Medical History:  Diagnosis Date   Asthma    Bipolar 1 disorder ([facility])    Family history of breast cancer    Family history of colon cancer    Family history of leukemia    Family history of lung cancer    Family history of ovarian cancer    Family history of throat cancer      Family History  Problem Relation Age of Onset   COPD Mother    Hyperlipidemia Mother    Lung cancer Mother 23   Hyperlipidemia Father    Leukemia Father 82   COPD Father    Depression Sister        ANXIETY   Healthy Sister    Breast cancer Maternal Aunt        dx 75s, recurrence 57s   Throat cancer Maternal Uncle    Lung cancer Paternal Uncle    Colon cancer Maternal Grandmother    Ovarian cancer Cousin 33       paternal   Ovarian cancer Cousin 60    Past Surgical History:  Procedure Laterality Date   ANKLE FRACTURE SURGERY Right 1992   CHOLECYSTECTOMY     COLONOSCOPY WITH PROPOFOL N/A 07/09/2020   Procedure: COLONOSCOPY WITH PROPOFOL;  Surgeon: Jonathon Bellows, MD;  [facility] END[facility]COPY;  Service: Gastroenterology;  Laterality: N/A;   Social History   Occupational History    Comment: Full-time  Tobacco Use   Smoking status: Former    Types: Cigarettes    Quit date: 03/17/2004    Years since quitting: 16.8   Smokeless tobacco: Never  Vaping Use   Vaping Use: Never used  Substance and Sexual Activity   Alcohol use: Yes    Comment: occ   Drug use: No   Sexual activity: Not on file

## 2021-01-10 ENCOUNTER — Other Ambulatory Visit: Payer: Self-pay

## 2021-01-10 ENCOUNTER — Ambulatory Visit: Payer: 59

## 2021-01-10 ENCOUNTER — Ambulatory Visit
Admission: RE | Admit: 2021-01-10 | Discharge: 2021-01-10 | Disposition: A | Discharge status: Home / Self Care | Payer: 59 | Source: Ambulatory Visit | Attending: Surgery | Admitting: Surgery

## 2021-01-10 DIAGNOSIS — M5441 Lumbago with sciatica, right side: Secondary | ICD-10-CM | POA: Diagnosis present

## 2021-01-10 DIAGNOSIS — G8929 Other chronic pain: Secondary | ICD-10-CM

## 2021-01-10 DIAGNOSIS — M4722 Other spondylosis with radiculopathy, cervical region: Secondary | ICD-10-CM | POA: Diagnosis present

## 2021-01-10 DIAGNOSIS — M4726 Other spondylosis with radiculopathy, lumbar region: Secondary | ICD-10-CM

## 2021-01-10 DIAGNOSIS — M542 Cervicalgia: Secondary | ICD-10-CM | POA: Diagnosis not present

## 2021-01-10 DIAGNOSIS — R2681 Unsteadiness on feet: Secondary | ICD-10-CM | POA: Diagnosis present

## 2021-01-10 DIAGNOSIS — M5442 Lumbago with sciatica, left side: Secondary | ICD-10-CM | POA: Insufficient documentation

## 2021-01-28 ENCOUNTER — Encounter: Payer: Self-pay | Admitting: Specialist

## 2021-01-28 ENCOUNTER — Other Ambulatory Visit: Payer: Self-pay

## 2021-01-28 ENCOUNTER — Ambulatory Visit (INDEPENDENT_AMBULATORY_CARE_PROVIDER_SITE_OTHER): Payer: 59 | Admitting: Specialist

## 2021-01-28 VITALS — BP 142/92 | HR 81 | Ht 68.0 in | Wt 275.0 lb

## 2021-01-28 DIAGNOSIS — M47812 Spondylosis without myelopathy or radiculopathy, cervical region: Secondary | ICD-10-CM

## 2021-01-28 DIAGNOSIS — M4726 Other spondylosis with radiculopathy, lumbar region: Secondary | ICD-10-CM

## 2021-01-28 DIAGNOSIS — R202 Paresthesia of skin: Secondary | ICD-10-CM

## 2021-01-28 DIAGNOSIS — M4807 Spinal stenosis, lumbosacral region: Secondary | ICD-10-CM

## 2021-01-28 DIAGNOSIS — R200 Anesthesia of skin: Secondary | ICD-10-CM | POA: Diagnosis not present

## 2021-01-28 MED ORDER — GABAPENTIN 300 MG PO CAPS: ORAL_CAPSULE | ORAL | 0 refills | Status: DC

## 2021-01-28 NOTE — Patient Instructions (Signed)
Plan:  Avoid frequent bending and stooping  No lifting greater than 10 lbs. May use ice or moist heat for pain. Weight loss is of benefit. Best medication for lumbar disc disease is arthritis medications like meloxicam. Exercise is important to improve your indurance and does allow people to function better inspite of back pain.  Avoid overhead lifting and overhead use of the arms. Do not lift greater than 5 lbs. Adjust head rest in vehicle to prevent hyperextension if rear ended. Take extra precautions to avoid falling. Referral to PT for eval and treatment of lumbar spondylosis and degenerative disc disease and cervical spondylosis.

## 2021-01-28 NOTE — Progress Notes (Addendum)
Office Visit Note   Patient: Megan Jimenez           Date of Birth: 12/11/1965           MRN: [id number] Visit Date: 01/28/2021              Requested by: Gwyneth Sprout, [address] [place],  [place] [address] PCP: Gwyneth Sprout, FNP   Assessment & Plan: Visit Diagnoses:  1. Spinal stenosis of lumbosacral region   2. Spondylosis without myelopathy or radiculopathy, cervical region   3. Other spondylosis with radiculopathy, lumbar region     Plan:  Avoid frequent bending and stooping  No lifting greater than 10 lbs. May use ice or moist heat for pain. Weight loss is of benefit. Best medication for lumbar disc disease is arthritis medications like meloxicam. Exercise is important to improve your indurance and does allow people to function better inspite of back pain.  Avoid overhead lifting and overhead use of the arms. Do not lift greater than 5 lbs. Adjust head rest in vehicle to prevent hyperextension if rear ended. Take extra precautions to avoid falling. Referral to PT for eval and treatment of lumbar spondylosis and degenerative disc disease and cervical spondylosis.    Procedures: No procedures performed   Clinical Data: No additional findings.   Subjective: Chief Complaint  Patient presents with   Neck - Pain, Follow-up    MRI Review   Lower Back - Pain, Follow-up    MRI Review    55 year old female with history of neck and low back pain. Report having had a fall in 2016 and landed at the bottom of stairs. 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 She saw Dr. Saintclair Halsted and was told she had a compressed nerve in her neck and also was told by Dr. Saintclair Halsted that she had a low back condition. Was told that she has a chronic condition and was Told she has degenerative disc disease. Has trouble transitioning from getting out of a car or a chair or a sofa.  She reports that she was out of insurance and self pain for a while. Reports that her husband wanter her  to get better. At first she had pain intermittantly but now it is becoming more Painfule in the back and the neck. She tends to lean on the cart with standing and walking at the grocery store.  She has knee pain and difficulty with standing and walking. She has neck popping and grating and it makes a lot of noise. She relates that there numbness to touch in the left arm into all the left hand fingers. There is weakness in the left hand and arm and has difficulty holding the left arm up.    Review of Systems  Constitutional: Negative.   HENT: Negative.    Eyes: Negative.   Respiratory: Negative.    Cardiovascular: Negative.   Gastrointestinal: Negative.   Endocrine: Negative.   Genitourinary: Negative.   Musculoskeletal: Negative.   Skin: Negative.   Allergic/Immunologic: Negative.   Neurological: Negative.   Hematological: Negative.   Psychiatric/Behavioral: Negative.      Objective: Vital Signs: BP (!) 142/92 (BP Location: Right Arm, Patient Position: Sitting)   Pulse 81   Ht 5\' 8"  (1.727 m)   Wt 275 lb (124.7 kg)   LMP 09/28/2018   BMI 41.81 kg/m   Physical Exam Constitutional:      Appearance: She is well-developed.  HENT:  Head: Normocephalic and atraumatic.  Eyes:     Pupils: Pupils are equal, round, and reactive to light.  Pulmonary:     Effort: Pulmonary effort is normal.     Breath sounds: Normal breath sounds.  Abdominal:     General: Bowel sounds are normal.     Palpations: Abdomen is soft.  Musculoskeletal:     Cervical back: Normal range of motion and neck supple.     Lumbar back: Negative right straight leg raise test and negative left straight leg raise test.  Skin:    General: Skin is warm and dry.  Neurological:     Mental Status: She is alert and oriented to person, place, and time.  Psychiatric:        Behavior: Behavior normal.        Thought Content: Thought content normal.        Judgment: Judgment normal.    Back Exam   Tenderness   The patient is experiencing tenderness in the lumbar.  Range of Motion  Extension:  abnormal  Flexion:  normal  Lateral bend right:  abnormal  Lateral bend left:  abnormal  Rotation right:  abnormal  Rotation left:  abnormal   Muscle Strength  Right Quadriceps:  5/5  Left Quadriceps:  5/5  Right Hamstrings:  5/5  Left Hamstrings:  5/5   Tests  Straight leg raise right: negative Straight leg raise left: negative  Reflexes  Patellar:  2/4 Achilles:  2/4 Biceps:  2/4 Babinski's sign: normal   Other  Toe walk: normal Heel walk: normal Sensation: normal Gait: abnormal  Erythema: no back redness Scars: absent     Specialty Comments:  No specialty comments available.  Imaging: No results found.   PMFS History: Patient Active Problem List   Diagnosis Date Noted   Genetic testing 12/12/2020   Osteoarthritis of cervical spine 11/29/2020   Osteoarthritis of thoracic spine 11/29/2020   Abnormal gait 11/29/2020   Numbness and tingling 11/29/2020   Need for shingles vaccine 11/29/2020   Need for hepatitis C screening test 11/29/2020   Weakness of both lower extremities 11/29/2020   Muscle spasm of back 11/29/2020   Mild intermittent asthma without complication 37/16/9678   Family history of breast cancer 11/28/2020   Family history of ovarian cancer 11/28/2020   Family history of colon cancer 11/28/2020   Family history of throat cancer 11/28/2020   Family history of lung cancer 11/28/2020   Family history of leukemia 11/28/2020   Allergic rhinitis 11/17/2014   Anxiety 09/08/2014   Acute asthma exacerbation 09/08/2014   Affective bipolar disorder ([place]) 09/08/2014   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/08/2014   Headache, migraine 09/08/2014   Excess, menstruation 09/08/2014   Past Medical History:  Diagnosis Date   Asthma    Bipolar 1 disorder ([place])    Family history of breast cancer    Family history of colon cancer    Family history of leukemia    Family history  of lung cancer    Family history of ovarian cancer    Family history of throat cancer     Family History  Problem Relation Age of Onset   COPD Mother    Hyperlipidemia Mother    Lung cancer Mother 64   Hyperlipidemia Father    Leukemia Father 60   COPD Father    Depression Sister        ANXIETY   Healthy Sister    Breast cancer Maternal Aunt  dx 66s, recurrence 65s   Throat cancer Maternal Uncle    Lung cancer Paternal Uncle    Colon cancer Maternal Grandmother    Ovarian cancer Cousin 6       paternal   Ovarian cancer Cousin 32    Past Surgical History:  Procedure Laterality Date   ANKLE FRACTURE SURGERY Right 1992   CHOLECYSTECTOMY     COLONOSCOPY WITH PROPOFOL N/A 07/09/2020   Procedure: COLONOSCOPY WITH PROPOFOL;  Surgeon: Jonathon Bellows, MD;  [facility] END[facility]COPY;  Service: Gastroenterology;  Laterality: N/A;   Social History   Occupational History    Comment: Full-time  Tobacco Use   Smoking status: Former    Types: Cigarettes    Quit date: 03/17/2004    Years since quitting: 16.8   Smokeless tobacco: Never  Vaping Use   Vaping Use: Never used  Substance and Sexual Activity   Alcohol use: Yes    Comment: occ   Drug use: No   Sexual activity: Not on file

## 2021-01-31 ENCOUNTER — Ambulatory Visit: Payer: 59 | Admitting: Specialist

## 2021-02-04 ENCOUNTER — Other Ambulatory Visit: Payer: Self-pay

## 2021-02-04 ENCOUNTER — Encounter: Payer: Self-pay | Admitting: Physical Therapy

## 2021-02-04 ENCOUNTER — Ambulatory Visit: Payer: 59 | Attending: Specialist | Admitting: Physical Therapy

## 2021-02-04 DIAGNOSIS — M4807 Spinal stenosis, lumbosacral region: Secondary | ICD-10-CM | POA: Insufficient documentation

## 2021-02-04 DIAGNOSIS — M542 Cervicalgia: Secondary | ICD-10-CM | POA: Insufficient documentation

## 2021-02-04 DIAGNOSIS — M4726 Other spondylosis with radiculopathy, lumbar region: Secondary | ICD-10-CM | POA: Insufficient documentation

## 2021-02-04 DIAGNOSIS — R262 Difficulty in walking, not elsewhere classified: Secondary | ICD-10-CM | POA: Insufficient documentation

## 2021-02-04 DIAGNOSIS — M47812 Spondylosis without myelopathy or radiculopathy, cervical region: Secondary | ICD-10-CM | POA: Insufficient documentation

## 2021-02-04 NOTE — Therapy (Signed)
[facility] [address] [place], [address] Phone: [phone]   Fax:  [phone]  Physical Therapy Evaluation  Patient Details  Name: Megan Jimenez MRN: [id number] Date of Birth: [birth year] Referring Provider (PT): [name] MD  Encounter Date: 02/04/2021   PT End of Session - 02/04/21 1304     Visit Number 1    Number of Visits 1    PT Start Time 6010    PT Stop Time 1130    PT Time Calculation (min) 1425 min    Activity Tolerance Patient tolerated treatment well    Behavior During Therapy [facility] for tasks assessed/performed             Past Medical History:  Diagnosis Date   Asthma    Bipolar 1 disorder ([place])    Family history of breast cancer    Family history of colon cancer    Family history of leukemia    Family history of lung cancer    Family history of ovarian cancer    Family history of throat cancer     Past Surgical History:  Procedure Laterality Date   ANKLE FRACTURE SURGERY Right 1992   CHOLECYSTECTOMY     COLONOSCOPY WITH PROPOFOL N/A 07/09/2020   Procedure: COLONOSCOPY WITH PROPOFOL;  Surgeon: Jonathon Bellows, MD;  Location: [facility] ENDOSCOPY;  Service: Gastroenterology;  Laterality: N/A;    There were no vitals filed for this visit.    Subjective Assessment - 02/04/21 1156     Subjective Pt reports having a fall in grocery store from a suddent hip pain she felt while making a turn. She feels a sharp pain intermittently and she is unsure about what causes. As far as the left shoulder, pt reports it is always numb and that numbness worsens when laying down. 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 Pt went to PT for injury a few times, but did not follow her HEP. Pt rides a motorcycle but she has been unable to ride because of weakness in leg and arms and she is unable to turn towards her left side. Reports  that she is being worked up for cervical and lumbar spinal surgery, and that she was sent to PT to determine whether she would respond to conservative treatment.    Pertinent History Left arm numbness and paresthesias with cervical spondylosis assess for C6 or C7 radiculopathy vs CTS.    How long can you sit comfortably? N/a    How long can you stand comfortably? N/a    How long can you walk comfortably? N/a    Diagnostic tests CLINICAL DATA:  Neck pain, chronic, degenerative changes on xray  Spinal stenosis, C-spine neck pain left UE radiculopathy, unsteady  gait. must rule out cervical myelopathy     EXAM:  MRI CERVICAL SPINE WITHOUT CONTRAST     TECHNIQUE:  Multiplanar, multisequence MR imaging of the cervical spine was  performed. No intravenous contrast was administered.     COMPARISON:  None.     FINDINGS:  Alignment: Slight reversal of the normal cervical lordosis. Mild  anterolisthesis of C2 on C3 and C3 on C4.     Vertebrae: Vertebral body heights are maintained. No focal marrow  edema to suggest acute fracture or discitis/osteomyelitis. No  suspicious bone lesion is seen.     Cord: Motion limited evaluation of the cord  without evidence of  abnormal cord signal.     Posterior Fossa, vertebral arteries, paraspinal tissues: Visualized  vertebral artery flow voids are maintained. Unremarkable visualized  posterior fossa. No appreciable paraspinal edema.     Disc levels:     C2-C3: Small posterior disc osteophyte complex and mild facet  hypertrophy without significant canal or foraminal stenosis.     C3-C4: Small posterior disc osteophyte complex and left greater than  right facet and uncovertebral hypertrophy. Moderate left and mild  right foraminal stenosis. Mild canal stenosis.     C4-C5: Small posterior disc osteophyte complex and left greater than  right facet and uncovertebral hypertrophy. Severe left and moderate  right foraminal stenosis. Mild canal stenosis.     C5-C6: Small posterior disc  osteophyte complex and left greater than  right facet and uncovertebral hypertrophy. Severe left and moderate  right foraminal stenosis. Mild canal stenosis.     C6-C7: Small posterior disc osteophyte complex and left greater than  right facet and uncovertebral hypertrophy. Moderate bilateral  foraminal stenosis. Mild canal stenosis.     C7-T1: No significant disc protrusion, foraminal stenosis, or canal  stenosis.     IMPRESSION:  1. Severe left foraminal stenosis at C4-C5 and C5-C6. Moderate  foraminal stenosis on the left at C3-C4, the right at C4-C5 and  C5-C6 and bilaterally at C6-C7.  2. Mild canal stenosis at C3-C4, C4-C5, C5-C6 and C6-C7.    Patient Stated Goals Prevent from having surgery    Currently in Pain? No/denies             [facility] PT Assessment - 02/04/21 0001       Assessment   Medical Diagnosis Cervical Radiculopathy Left Sided    Referring Provider (PT) [name] MD    Onset Date/Surgical Date 05/02/20    Hand Dominance Right    Next MD Visit 01/20/2021    Prior Therapy Yes      Balance Screen   Has the patient fallen in the past 6 months Yes    How many times? 3    Has the patient had a decrease in activity level because of a fear of falling?  Yes    Is the patient reluctant to leave their home because of a fear of falling?  Yes      [place] Private residence    Living Arrangements Spouse/significant other    Type of [place] Access Level entry    [place] One level      Prior Function   Level of Independence Independent      Cognition   Overall Cognitive Status Within Functional Limits for tasks assessed             CERIVCAL TRIAGE    5 D's And 3 N's: denies diplopia, drop attacks, dysarthria, dysphagia, ataxia of gait, and nystagmus; denies lightheadedness, numbness and occasional nausea  -Notes dizziness and nausea, ataxia, drop attacks, and numbness that has been worsening over past several months    Cervical Radiculopathy Test cluster:   -rotation AROM <60 deg towards symptomatic side?: + L    -Spurling's test: Deferred    -response to manual traction: Deferred    -upper limb tension test A: Deferred    MUSCULOSKELETAL:   AROM:                   Shoulder Flex R/L 160/160  Shoulder ER combined R/L C2 C2              Shoulder IR combined R/L  L1 L1                        SENSATION: Decreased sensation on all fingers on left hand                 Plan - 02/04/21 1305     Clinical Impression Statement Pt is a 55 yo female that presents with neck pain and numbness in his left arm. Pt exhibits red flag cervical symptoms that warrant immediate medical attention. These symptoms include ataxia, dysarthria, drop foot, LUE numbness, and nausea. These signs and symptoms align with the severity of her left sided cervical stenosis. PT has instructed pt that she is not appropriate for therapy and to immediately seek out medical attention from her referring provider or to report to ER if her symptoms continue to worsen.    Personal Factors and Comorbidities Age    Examination-Activity Limitations Bathing;Carry;Lift;Reach Overhead;Stand    Stability/Clinical Decision Making Unstable/Unpredictable    Clinical Decision Making High    Rehab Potential Poor    PT Frequency One time visit    PT Next Visit Plan N/a    PT Home Exercise Plan N/a    Recommended Other Services Immediately report to medical provider    Consulted and Agree with Plan of Care Patient             Patient will benefit from skilled therapeutic intervention in order to improve the following deficits and impairments:  Impaired sensation, Impaired UE functional use, Dizziness, Difficulty walking, Decreased range of motion  Visit Diagnosis: Cervicalgia  Difficulty in walking, not elsewhere classified     Problem List Patient Active Problem List   Diagnosis Date Noted   Genetic testing 12/12/2020    Osteoarthritis of cervical spine 11/29/2020   Osteoarthritis of thoracic spine 11/29/2020   Abnormal gait 11/29/2020   Numbness and tingling 11/29/2020   Need for shingles vaccine 11/29/2020   Need for hepatitis C screening test 11/29/2020   Weakness of both lower extremities 11/29/2020   Muscle spasm of back 11/29/2020   Mild intermittent asthma without complication 46/65/9935   Family history of breast cancer 11/28/2020   Family history of ovarian cancer 11/28/2020   Family history of colon cancer 11/28/2020   Family history of throat cancer 11/28/2020   Family history of lung cancer 11/28/2020   Family history of leukemia 11/28/2020   Allergic rhinitis 11/17/2014   Anxiety 09/08/2014   Acute asthma exacerbation 09/08/2014   Affective bipolar disorder ([place]) 09/08/2014   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/08/2014   Headache, migraine 09/08/2014   Excess, menstruation 09/08/2014   Bradly Chris PT, DPT  02/04/2021, 1:52 PM  [facility] [address] [place], [address] Phone: [phone]   Fax:  [phone]  Name: Megan Jimenez MRN: [id number] Date of Birth: December 23, 1965

## 2021-02-06 ENCOUNTER — Ambulatory Visit: Payer: 59 | Admitting: Family Medicine

## 2021-02-08 ENCOUNTER — Other Ambulatory Visit: Payer: Self-pay | Admitting: Family Medicine

## 2021-02-08 DIAGNOSIS — M62830 Muscle spasm of back: Secondary | ICD-10-CM

## 2021-02-09 NOTE — Telephone Encounter (Signed)
Requested medication (s) are due for refill today: yes  Requested medication (s) are on the active medication list: yes  Last refill:  11/29/20 #30 1 RF  Future visit scheduled: no  Notes to clinic:  med not delegated to NT to RF   Requested Prescriptions  Pending Prescriptions Disp Refills   cyclobenzaprine (FLEXERIL) 5 MG tablet [Pharmacy Med Name: CYCLOBENZAPRINE 5 MG TABLET] 30 tablet 1    Sig: TAKE 1 TABLET BY MOUTH THREE TIMES A DAY AS NEEDED FOR MUSCLE SPASMS     Not Delegated - Analgesics:  Muscle Relaxants Failed - 02/08/2021 12:43 PM      Failed - This refill cannot be delegated      Passed - Valid encounter within last 6 months    Recent Outpatient Visits           2 months ago Osteoarthritis of cervical spine, unspecified spinal osteoarthritis complication status   [facility] [place] Gwyneth Sprout, FNP   7 months ago Atypical pneumonia   [facility] [place], Clearnce Sorrel, [place]   8 months ago Screening for colon cancer   [facility] [place] Malling M, [place]   9 months ago Annual physical exam   [facility] [place], [place]   2 years ago [place], Dionne Bucy, MD       Future Appointments             In 2 days Jessy Oto, MD [place]   In 2 weeks Jessy Oto, MD [facility]

## 2021-02-11 ENCOUNTER — Encounter: Payer: 59 | Admitting: Physical Therapy

## 2021-02-11 ENCOUNTER — Encounter: Payer: Self-pay | Admitting: Specialist

## 2021-02-11 ENCOUNTER — Other Ambulatory Visit: Payer: Self-pay

## 2021-02-11 ENCOUNTER — Ambulatory Visit (INDEPENDENT_AMBULATORY_CARE_PROVIDER_SITE_OTHER): Payer: 59 | Admitting: Specialist

## 2021-02-11 ENCOUNTER — Other Ambulatory Visit: Payer: Self-pay | Admitting: Family Medicine

## 2021-02-11 VITALS — BP 154/90 | HR 81 | Ht 68.0 in | Wt 275.0 lb

## 2021-02-11 DIAGNOSIS — M4807 Spinal stenosis, lumbosacral region: Secondary | ICD-10-CM

## 2021-02-11 DIAGNOSIS — M4722 Other spondylosis with radiculopathy, cervical region: Secondary | ICD-10-CM

## 2021-02-11 DIAGNOSIS — M4726 Other spondylosis with radiculopathy, lumbar region: Secondary | ICD-10-CM | POA: Diagnosis not present

## 2021-02-11 DIAGNOSIS — R200 Anesthesia of skin: Secondary | ICD-10-CM

## 2021-02-11 DIAGNOSIS — M47812 Spondylosis without myelopathy or radiculopathy, cervical region: Secondary | ICD-10-CM | POA: Diagnosis not present

## 2021-02-11 DIAGNOSIS — M542 Cervicalgia: Secondary | ICD-10-CM | POA: Diagnosis not present

## 2021-02-11 DIAGNOSIS — M62830 Muscle spasm of back: Secondary | ICD-10-CM

## 2021-02-11 DIAGNOSIS — R202 Paresthesia of skin: Secondary | ICD-10-CM

## 2021-02-11 MED ORDER — HYDROCODONE-ACETAMINOPHEN 5-325 MG PO TABS: 1.0000 | ORAL_TABLET | Freq: Four times a day (QID) | ORAL | 0 refills | Status: DC | PRN

## 2021-02-11 NOTE — Progress Notes (Signed)
Office Visit Note   Patient: Megan Jimenez           Date of Birth: 05-29-1965           MRN: [id number] Visit Date: 02/11/2021              Requested by: Gwyneth Sprout, [address],  [place] [address] PCP: Gwyneth Sprout, FNP   Assessment & Plan: Visit Diagnoses:  1. Spondylosis without myelopathy or radiculopathy, cervical region   2. Other spondylosis with radiculopathy, lumbar region   3. Neck pain   4. Numbness and tingling in left arm   5. Spinal stenosis of lumbosacral region   6. Other spondylosis with radiculopathy, cervical region     Plan: Avoid overhead lifting and overhead use of the arms. Do not lift greater than 5 lbs. Adjust head rest in vehicle to prevent hyperextension if rear ended. Take extra precautions to avoid falling, including use of a cane if you feel weak. Scheduling secretary Kandice Hams. will call you to arrange for surgery for your cervical spine. If you wish a second opinion please let us know and we can arrange for you. If you have worsening arm or leg numbness or weakness please call or go to an ER. We will contact your cardiologist and primary care physicians to seek clearance for your surgery. Surgery will be an anterior cervical discectomy and fusions C4-5, C5-6 and C6-7 level with decompression of the cervical spinal canal and the neuroforamen. An additional decompression fusion would be done with bone grafting and plate and screws, Local bone graft as well and allograft bone graft and vivigen.  Risks of surgery include risks of infection, bleeding and risks to the spinal cord and  Risks of sore throat and difficulty swallowing which should  Improve over the next 4-6 weeks following surgery. Surgery is indicated due to upper extremity radiculopathy, lermittes phenomena and falls. In the future surgery at adjacent levels may be necessary but these levels do not appear to be related to your current symptoms or signs. I  recommend bilateral EMG/NCV prior to surgery so that if CTS or nerve compression is present in the arms then that would be addressed at the same time.    Follow-Up Instructions: No follow-ups on file.   Orders:  No orders of the defined types were placed in this encounter.  No orders of the defined types were placed in this encounter.     Procedures: No procedures performed   Clinical Data: No additional findings.   Subjective: Chief Complaint  Patient presents with   Lower Back - Follow-up   Neck - Follow-up    HPI  Review of Systems   Objective: Vital Signs: BP (!) 154/90   Pulse 81   Ht 5\' 8"  (1.727 m)   Wt 275 lb (124.7 kg)   LMP 09/28/2018   BMI 41.81 kg/m   Physical Exam  Ortho Exam  Specialty Comments:  No specialty comments available.  Imaging: No results found.   PMFS History: Patient Active Problem List   Diagnosis Date Noted   Genetic testing 12/12/2020   Osteoarthritis of cervical spine 11/29/2020   Osteoarthritis of thoracic spine 11/29/2020   Abnormal gait 11/29/2020   Numbness and tingling 11/29/2020   Need for shingles vaccine 11/29/2020   Need for hepatitis C screening test 11/29/2020   Weakness of both lower extremities 11/29/2020   Muscle spasm of back 11/29/2020   Mild intermittent asthma without  complication 42/87/6811   Family history of breast cancer 11/28/2020   Family history of ovarian cancer 11/28/2020   Family history of colon cancer 11/28/2020   Family history of throat cancer 11/28/2020   Family history of lung cancer 11/28/2020   Family history of leukemia 11/28/2020   Allergic rhinitis 11/17/2014   Anxiety 09/08/2014   Acute asthma exacerbation 09/08/2014   Affective bipolar disorder ([place]) 09/08/2014   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/08/2014   Headache, migraine 09/08/2014   Excess, menstruation 09/08/2014   Past Medical History:  Diagnosis Date   Asthma    Bipolar 1 disorder ([place])    Family history of breast  cancer    Family history of colon cancer    Family history of leukemia    Family history of lung cancer    Family history of ovarian cancer    Family history of throat cancer     Family History  Problem Relation Age of Onset   COPD Mother    Hyperlipidemia Mother    Lung cancer Mother 1   Hyperlipidemia Father    Leukemia Father 30   COPD Father    Depression Sister        ANXIETY   Healthy Sister    Breast cancer Maternal Aunt        dx 60s, recurrence 31s   Throat cancer Maternal Uncle    Lung cancer Paternal Uncle    Colon cancer Maternal Grandmother    Ovarian cancer Cousin 21       paternal   Ovarian cancer Cousin 25    Past Surgical History:  Procedure Laterality Date   ANKLE FRACTURE SURGERY Right 1992   CHOLECYSTECTOMY     COLONOSCOPY WITH PROPOFOL N/A 07/09/2020   Procedure: COLONOSCOPY WITH PROPOFOL;  Surgeon: Jonathon Bellows, MD;  Location: [facility] ENDOSCOPY;  Service: Gastroenterology;  Laterality: N/A;   Social History   Occupational History    Comment: Full-time  Tobacco Use   Smoking status: Former    Types: Cigarettes    Quit date: 03/17/2004    Years since quitting: 16.9   Smokeless tobacco: Never  Vaping Use   Vaping Use: Never used  Substance and Sexual Activity   Alcohol use: Yes    Comment: occ   Drug use: No   Sexual activity: Not on file

## 2021-02-11 NOTE — Patient Instructions (Signed)
  Plan: Avoid overhead lifting and overhead use of the arms. Do not lift greater than 5 lbs. Adjust head rest in vehicle to prevent hyperextension if rear ended. Take extra precautions to avoid falling, including use of a cane if you feel weak. Scheduling secretary Kandice Hams. will call you to arrange for surgery for your cervical spine. If you wish a second opinion please let us know and we can arrange for you. If you have worsening arm or leg numbness or weakness please call or go to an ER. We will contact your cardiologist and primary care physicians to seek clearance for your surgery. Surgery will be an anterior cervical discectomy and fusions C4-5, C5-6 and C6-7 level with decompression of the cervical spinal canal and the neuroforamen. An additional decompression fusion would be done with bone grafting and plate and screws, Local bone graft as well and allograft bone graft and vivigen.  Risks of surgery include risks of infection, bleeding and risks to the spinal cord and  Risks of sore throat and difficulty swallowing which should  Improve over the next 4-6 weeks following surgery. Surgery is indicated due to upper extremity radiculopathy, lermittes phenomena and falls. In the future surgery at adjacent levels may be necessary but these levels do not appear to be related to your current symptoms or signs. I recommend bilateral EMG/NCV prior to surgery so that if CTS or nerve compression is present in the arms then that would be addressed at the same time.

## 2021-02-11 NOTE — Telephone Encounter (Signed)
Medication Refill - Medication:fluticasone furoate-vilanterol (BREO ELLIPTA) 200-25 MCG/INH AEPB and cyclobenzaprine (FLEXERIL) 5 MG tablet  Has the patient contacted their pharmacy? yes (Agent: If no, request that the patient contact the pharmacy for the refill. If patient does not wish to contact the pharmacy document the reason why and proceed with request.) (Agent: If yes, when and what did the pharmacy advise?)contact pcp, patient says was told pharmacy needs a new faxed prescription with cover letter faxed to them  Preferred Pharmacy (with phone number or street name): [facility] [place], [place] [phone]  fx [phone] and phone number [phone] Has the patient been seen for an appointment in the last year OR does the patient have an upcoming appointment? yes  Agent: Please be advised that RX refills may take up to 3 business days. We ask that you follow-up with your pharmacy.

## 2021-02-12 ENCOUNTER — Ambulatory Visit (INDEPENDENT_AMBULATORY_CARE_PROVIDER_SITE_OTHER): Payer: 59 | Admitting: Physical Medicine and Rehabilitation

## 2021-02-12 ENCOUNTER — Encounter: Payer: Self-pay | Admitting: Physical Medicine and Rehabilitation

## 2021-02-12 DIAGNOSIS — R202 Paresthesia of skin: Secondary | ICD-10-CM | POA: Diagnosis not present

## 2021-02-12 NOTE — Progress Notes (Signed)
Megan Jimenez - 55 y.o. female MRN [id number]  Date of birth: 07/13/1965  Office Visit Note: Visit Date: 02/12/2021 PCP: Gwyneth Sprout, FNP Referred by: Gwyneth Sprout, FNP  Subjective: Chief Complaint  Patient presents with   Left Shoulder - Numbness, Weakness, Pain   Left Arm - Numbness, Weakness, Pain   Left Hand - Numbness, Weakness, Pain   HPI:  Megan Jimenez is a 55 y.o. female who comes in today at the request of Dr. [name] for electrodiagnostic study of the Left  upper extremities.  Patient is Right hand dominant.  She reports significant pain in the left neck and shoulder but also with pain numbness and tingling in the arm and hand.  Symptoms are somewhat nondermatomal in the arm but do coincide with more of the radial digits.  She denies any right-sided complaints.  She has not had prior electrodiagnostic studies.  ROS Otherwise per HPI.  Assessment & Plan: Visit Diagnoses:    ICD-10-CM   1. Paresthesia of skin  R20.2 NCV with EMG (electromyography)      Plan: Impression: The above electrodiagnostic study is ABNORMAL and reveals evidence of a severe left median nerve entrapment at the wrist (carpal tunnel syndrome) affecting sensory and motor components.   There is no significant electrodiagnostic evidence of any other focal nerve entrapment, brachial plexopathy or cervical radiculopathy.   Recommendations: 1.  Follow-up with referring physician. 2.  Continue current management of symptoms. 3.  Suggest surgical evaluation.  Meds & Orders: No orders of the defined types were placed in this encounter.   Orders Placed This Encounter  Procedures   NCV with EMG (electromyography)    Follow-up: Return for [name], MD as scheduled.   Procedures: No procedures performed  EMG & NCV Findings: Evaluation of the left median motor nerve showed prolonged distal onset latency (4.5 ms), reduced amplitude (3.9 mV), and decreased conduction velocity  (Elbow-Wrist, 46 m/s).  The left median (across palm) sensory nerve showed prolonged distal peak latency (Wrist, 4.6 ms).  The left ulnar sensory nerve showed prolonged distal peak latency (3.8 ms) and decreased conduction velocity (Wrist-5th Digit, 37 m/s).  All remaining nerves (as indicated in the following tables) were within normal limits.    Needle evaluation of the left abductor pollicis brevis muscle showed increased insertional activity and slightly increased spontaneous activity.  All remaining muscles (as indicated in the following table) showed no evidence of electrical instability.    Impression: The above electrodiagnostic study is ABNORMAL and reveals evidence of a severe left median nerve entrapment at the wrist (carpal tunnel syndrome) affecting sensory and motor components.   There is no significant electrodiagnostic evidence of any other focal nerve entrapment, brachial plexopathy or cervical radiculopathy.   Recommendations: 1.  Follow-up with referring physician. 2.  Continue current management of symptoms. 3.  Suggest surgical evaluation.  ___________________________ Laurence Spates FAAPMR Board Certified, American Board of Physical Medicine and Rehabilitation    Nerve Conduction Studies Anti Sensory Summary Table   Stim Site NR Peak (ms) Norm Peak (ms) P-T Amp (V) Norm P-T Amp Site1 Site2 Delta-P (ms) Dist (cm) Vel (m/s) Norm Vel (m/s)  Left Median Acr Palm Anti Sensory (2nd Digit)  29.8C  Wrist    *4.6 <3.6 26.1 >10 Wrist Palm 2.7 0.0    Palm    1.9 <2.0 23.4         Left Radial Anti Sensory (Base 1st Digit)  30.3C  Wrist  2.1 <3.1 20.1  Wrist Base 1st Digit 2.1 0.0    Left Ulnar Anti Sensory (5th Digit)  30.2C  Wrist    *3.8 <3.7 24.7 >15.0 Wrist 5th Digit 3.8 14.0 *37 >38   Motor Summary Table   Stim Site NR Onset (ms) Norm Onset (ms) O-P Amp (mV) Norm O-P Amp Site1 Site2 Delta-0 (ms) Dist (cm) Vel (m/s) Norm Vel (m/s)  Left Median Motor (Abd Poll Brev)   30.4C  Wrist    *4.5 <4.2 *3.9 >5 Elbow Wrist 4.5 20.5 *46 >50  Elbow    9.0  3.0         Left Ulnar Motor (Abd Dig Min)  30.4C  Wrist    3.1 <4.2 9.3 >3 B Elbow Wrist 3.5 20.5 59 >53  B Elbow    6.6  9.0  A Elbow B Elbow 1.4 10.0 71 >53  A Elbow    8.0  6.3          EMG   Side Muscle Nerve Root Ins Act Fibs Psw Amp Dur Poly Recrt Int Fraser Din Comment  Left Abd Poll Brev Median C8-T1 *Incr *1+ *1+ Nml Nml 0 Nml Nml   Left 1stDorInt Ulnar C8-T1 Nml Nml Nml Nml Nml 0 Nml Nml   Left PronatorTeres Median C6-7 Nml Nml Nml Nml Nml 0 Nml Nml   Left Biceps Musculocut C5-6 Nml Nml Nml Nml Nml 0 Nml Nml   Left Deltoid Axillary C5-6 Nml Nml Nml Nml Nml 0 Nml Nml     Nerve Conduction Studies Anti Sensory Left/Right Comparison   Stim Site L Lat (ms) R Lat (ms) L-R Lat (ms) L Amp (V) R Amp (V) L-R Amp (%) Site1 Site2 L Vel (m/s) R Vel (m/s) L-R Vel (m/s)  Median Acr Palm Anti Sensory (2nd Digit)  29.8C  Wrist *4.6   26.1   Wrist Palm     Palm 1.9   23.4         Radial Anti Sensory (Base 1st Digit)  30.3C  Wrist 2.1   20.1   Wrist Base 1st Digit     Ulnar Anti Sensory (5th Digit)  30.2C  Wrist *3.8   24.7   Wrist 5th Digit *37     Motor Left/Right Comparison   Stim Site L Lat (ms) R Lat (ms) L-R Lat (ms) L Amp (mV) R Amp (mV) L-R Amp (%) Site1 Site2 L Vel (m/s) R Vel (m/s) L-R Vel (m/s)  Median Motor (Abd Poll Brev)  30.4C  Wrist *4.5   *3.9   Elbow Wrist *46    Elbow 9.0   3.0         Ulnar Motor (Abd Dig Min)  30.4C  Wrist 3.1   9.3   B Elbow Wrist 59    B Elbow 6.6   9.0   A Elbow B Elbow 71    A Elbow 8.0   6.3            Waveforms:           Clinical History: No specialty comments available.     Objective:  VS:  HT:    WT:   BMI:     BP:   HR: bpm  TEMP: ( )  RESP:  Physical Exam Musculoskeletal:        General: No swelling, tenderness or deformity.     Comments: Inspection reveals no atrophy of the bilateral APB or FDI or hand intrinsics. There is no  swelling, color changes, allodynia or dystrophic changes. There is 5 out of 5 strength in the bilateral wrist extension, finger abduction and long finger flexion. There is intact sensation to light touch in all dermatomal and peripheral nerve distributions. There is a negative Froment's test bilaterally. There is a negative Tinel's test at the bilateral wrist and elbow. There is a negative Phalen's test bilaterally. There is a negative Hoffmann's test bilaterally.  Skin:    General: Skin is warm and dry.     Findings: No erythema or rash.  Neurological:     General: No focal deficit present.     Mental Status: She is alert and oriented to person, place, and time.     Motor: No weakness or abnormal muscle tone.     Coordination: Coordination normal.  Psychiatric:        Mood and Affect: Mood normal.        Behavior: Behavior normal.     Imaging: No results found.

## 2021-02-12 NOTE — Telephone Encounter (Signed)
Requested medications are due for refill today.  Yes for both  Requested medications are on the active medications list.  yes  Last refill. Flonase 09/30/2017, Flexeril 11/29/2020  Future visit scheduled.   no  Notes to clinic.  Flexeril is not delegated.  Flonase rx was signed by  Dr. Posey Pronto.

## 2021-02-12 NOTE — Progress Notes (Signed)
Pt state left shoulder down her arm and fingers. Pt state she feels numbness and weakness. Pt state any movement of her left arm she can feel sharp pain down her arm. Pt state she is right handed.  Numeric Pain Rating Scale and Functional Assessment Average Pain 7   In the last MONTH (on 0-10 scale) has pain interfered with the following?  1. General activity like being  able to carry out your everyday physical activities such as walking, climbing stairs, carrying groceries, or moving a chair?  Rating(10)

## 2021-02-13 NOTE — Procedures (Signed)
EMG & NCV Findings: Evaluation of the left median motor nerve showed prolonged distal onset latency (4.5 ms), reduced amplitude (3.9 mV), and decreased conduction velocity (Elbow-Wrist, 46 m/s).  The left median (across palm) sensory nerve showed prolonged distal peak latency (Wrist, 4.6 ms).  The left ulnar sensory nerve showed prolonged distal peak latency (3.8 ms) and decreased conduction velocity (Wrist-5th Digit, 37 m/s).  All remaining nerves (as indicated in the following tables) were within normal limits.    Needle evaluation of the left abductor pollicis brevis muscle showed increased insertional activity and slightly increased spontaneous activity.  All remaining muscles (as indicated in the following table) showed no evidence of electrical instability.    Impression: The above electrodiagnostic study is ABNORMAL and reveals evidence of a severe left median nerve entrapment at the wrist (carpal tunnel syndrome) affecting sensory and motor components.   There is no significant electrodiagnostic evidence of any other focal nerve entrapment, brachial plexopathy or cervical radiculopathy.   Recommendations: 1.  Follow-up with referring physician. 2.  Continue current management of symptoms. 3.  Suggest surgical evaluation.  ___________________________ Laurence Spates FAAPMR Board Certified, American Board of Physical Medicine and Rehabilitation    Nerve Conduction Studies Anti Sensory Summary Table   Stim Site NR Peak (ms) Norm Peak (ms) P-T Amp (V) Norm P-T Amp Site1 Site2 Delta-P (ms) Dist (cm) Vel (m/s) Norm Vel (m/s)  Left Median Acr Palm Anti Sensory (2nd Digit)  29.8C  Wrist    *4.6 <3.6 26.1 >10 Wrist Palm 2.7 0.0    Palm    1.9 <2.0 23.4         Left Radial Anti Sensory (Base 1st Digit)  30.3C  Wrist    2.1 <3.1 20.1  Wrist Base 1st Digit 2.1 0.0    Left Ulnar Anti Sensory (5th Digit)  30.2C  Wrist    *3.8 <3.7 24.7 >15.0 Wrist 5th Digit 3.8 14.0 *37 >38   Motor Summary  Table   Stim Site NR Onset (ms) Norm Onset (ms) O-P Amp (mV) Norm O-P Amp Site1 Site2 Delta-0 (ms) Dist (cm) Vel (m/s) Norm Vel (m/s)  Left Median Motor (Abd Poll Brev)  30.4C  Wrist    *4.5 <4.2 *3.9 >5 Elbow Wrist 4.5 20.5 *46 >50  Elbow    9.0  3.0         Left Ulnar Motor (Abd Dig Min)  30.4C  Wrist    3.1 <4.2 9.3 >3 B Elbow Wrist 3.5 20.5 59 >53  B Elbow    6.6  9.0  A Elbow B Elbow 1.4 10.0 71 >53  A Elbow    8.0  6.3          EMG   Side Muscle Nerve Root Ins Act Fibs Psw Amp Dur Poly Recrt Int Fraser Din Comment  Left Abd Poll Brev Median C8-T1 *Incr *1+ *1+ Nml Nml 0 Nml Nml   Left 1stDorInt Ulnar C8-T1 Nml Nml Nml Nml Nml 0 Nml Nml   Left PronatorTeres Median C6-7 Nml Nml Nml Nml Nml 0 Nml Nml   Left Biceps Musculocut C5-6 Nml Nml Nml Nml Nml 0 Nml Nml   Left Deltoid Axillary C5-6 Nml Nml Nml Nml Nml 0 Nml Nml     Nerve Conduction Studies Anti Sensory Left/Right Comparison   Stim Site L Lat (ms) R Lat (ms) L-R Lat (ms) L Amp (V) R Amp (V) L-R Amp (%) Site1 Site2 L Vel (m/s) R Vel (m/s) L-R Vel (m/s)  Median  Acr Palm Anti Sensory (2nd Digit)  29.8C  Wrist *4.6   26.1   Wrist Palm     Palm 1.9   23.4         Radial Anti Sensory (Base 1st Digit)  30.3C  Wrist 2.1   20.1   Wrist Base 1st Digit     Ulnar Anti Sensory (5th Digit)  30.2C  Wrist *3.8   24.7   Wrist 5th Digit *37     Motor Left/Right Comparison   Stim Site L Lat (ms) R Lat (ms) L-R Lat (ms) L Amp (mV) R Amp (mV) L-R Amp (%) Site1 Site2 L Vel (m/s) R Vel (m/s) L-R Vel (m/s)  Median Motor (Abd Poll Brev)  30.4C  Wrist *4.5   *3.9   Elbow Wrist *46    Elbow 9.0   3.0         Ulnar Motor (Abd Dig Min)  30.4C  Wrist 3.1   9.3   B Elbow Wrist 59    B Elbow 6.6   9.0   A Elbow B Elbow 71    A Elbow 8.0   6.3            Waveforms:

## 2021-02-14 ENCOUNTER — Encounter: Payer: 59 | Admitting: Physical Therapy

## 2021-02-14 MED ORDER — FLUTICASONE PROPIONATE 50 MCG/ACT NA SUSP: 2.0000 | Freq: Every day | NASAL | 2 refills | Status: DC

## 2021-02-14 MED ORDER — CYCLOBENZAPRINE HCL 5 MG PO TABS: 5.0000 mg | ORAL_TABLET | Freq: Three times a day (TID) | ORAL | 1 refills | Status: DC | PRN

## 2021-02-15 ENCOUNTER — Other Ambulatory Visit: Payer: Self-pay | Admitting: Family Medicine

## 2021-02-15 DIAGNOSIS — M47812 Spondylosis without myelopathy or radiculopathy, cervical region: Secondary | ICD-10-CM

## 2021-02-15 DIAGNOSIS — M47814 Spondylosis without myelopathy or radiculopathy, thoracic region: Secondary | ICD-10-CM

## 2021-02-15 MED ORDER — FLUTICASONE PROPIONATE 50 MCG/ACT NA SUSP: 2.0000 | Freq: Every day | NASAL | 2 refills | Status: DC

## 2021-02-18 ENCOUNTER — Encounter: Payer: 59 | Admitting: Physical Therapy

## 2021-02-19 ENCOUNTER — Telehealth: Payer: Self-pay | Admitting: Specialist

## 2021-02-19 NOTE — Telephone Encounter (Signed)
Please advise 

## 2021-02-19 NOTE — Telephone Encounter (Signed)
Pt called stating she would like a CB to get her surgery set up; she states she's been waiting for a little over a week and she needs to plan her days.   [phone]

## 2021-02-20 NOTE — Telephone Encounter (Signed)
I called patient and advised surgery sheet received today.  Will send for PCP clearance and once received will contact patient back to schedule surgery.

## 2021-02-20 NOTE — Telephone Encounter (Signed)
Started blue sheet

## 2021-02-20 NOTE — Telephone Encounter (Signed)
I called patient and left voice mail for return call. °

## 2021-02-21 ENCOUNTER — Encounter: Payer: 59 | Admitting: Physical Therapy

## 2021-02-25 ENCOUNTER — Ambulatory Visit: Payer: 59 | Admitting: Specialist

## 2021-02-26 ENCOUNTER — Other Ambulatory Visit: Payer: Self-pay | Admitting: Specialist

## 2021-02-26 ENCOUNTER — Encounter: Payer: 59 | Admitting: Physical Therapy

## 2021-02-26 NOTE — Telephone Encounter (Signed)
Pt would like refill on hydrocodone.

## 2021-02-26 NOTE — Addendum Note (Signed)
Addended by: Minda Ditto, Geoffery Spruce on: 02/26/2021 05:18 PM   Modules accepted: Orders

## 2021-02-27 MED ORDER — HYDROCODONE-ACETAMINOPHEN 5-325 MG PO TABS: 1.0000 | ORAL_TABLET | Freq: Four times a day (QID) | ORAL | 0 refills | Status: DC | PRN

## 2021-02-28 ENCOUNTER — Encounter: Payer: 59 | Admitting: Physical Therapy

## 2021-02-28 ENCOUNTER — Encounter: Payer: Self-pay | Admitting: Specialist

## 2021-02-28 DIAGNOSIS — G5602 Carpal tunnel syndrome, left upper limb: Secondary | ICD-10-CM

## 2021-03-05 ENCOUNTER — Encounter: Payer: 59 | Admitting: Physical Therapy

## 2021-03-07 ENCOUNTER — Encounter: Payer: 59 | Admitting: Physical Therapy

## 2021-03-12 ENCOUNTER — Encounter: Payer: 59 | Admitting: Physical Therapy

## 2021-03-14 ENCOUNTER — Encounter: Payer: 59 | Admitting: Physical Therapy

## 2021-03-21 ENCOUNTER — Ambulatory Visit: Payer: 59 | Admitting: Surgery

## 2021-03-22 ENCOUNTER — Telehealth: Payer: Self-pay | Admitting: Family Medicine

## 2021-03-22 ENCOUNTER — Ambulatory Visit: Payer: Self-pay

## 2021-03-22 ENCOUNTER — Other Ambulatory Visit: Payer: Self-pay | Admitting: Family Medicine

## 2021-03-22 DIAGNOSIS — J4541 Moderate persistent asthma with (acute) exacerbation: Secondary | ICD-10-CM

## 2021-03-22 MED ORDER — FLUTICASONE FUROATE-VILANTEROL 200-25 MCG/ACT IN AEPB: 1.0000 | INHALATION_SPRAY | Freq: Every day | RESPIRATORY_TRACT | 5 refills | Status: DC

## 2021-03-22 NOTE — Telephone Encounter (Signed)
Pt called on 11.28.22 and requested a refill for her fluticasone furoate-vilanterol (BREO ELLIPTA) 200-25 MCG/INH AEPB  which she gets through [employer] ([place] assistance program/ the provider needs to send the authorization for the RX since it expired / they just need a note that it is ok to fill this RX / please fax note to [facility] asap with a cover sheet to [phone]/  pt is completely out   Pt stated she received a refill for fluticasone (FLONASE) 50 MCG/ACT nasal spray  instead of what she needed which was her inhaler / please advise asap

## 2021-03-22 NOTE — Telephone Encounter (Signed)
Spoke with patient and advised that we no longer carry samples of Breo in office. I triaged patient further for her complaints of shortness of breath. Patient denies being short of breath or any difficulty taking in a breath. Patient states that when she spoke with nurse she advised her that since covid her breathing has " not been right" and states that she can tell a difference since being off medication to treat for covid, patient was diagnosed with covid 03/05/21. Patient request refill on medication and letter written giving authorization to fill prescription ( see previous telephone encounter). KW

## 2021-03-22 NOTE — Addendum Note (Signed)
Addended by: Minette Headland on: 03/22/2021 04:39 PM   Modules accepted: Orders

## 2021-03-22 NOTE — Telephone Encounter (Signed)
Please see message below, okay to write letter to continue [facility] for maintanence? KW

## 2021-03-22 NOTE — Telephone Encounter (Signed)
Sent. Amparo [place]

## 2021-03-22 NOTE — Telephone Encounter (Signed)
Refill sent in will fax letter to [facility]. KW

## 2021-03-22 NOTE — Telephone Encounter (Signed)
° °  Chief Complaint: Cough, shortness of breath Symptoms: Cough Frequency: 03/05/21 positive for COVID 19. Pertinent Negatives: Patient denies fever Disposition: [] ED /[] Urgent Care (no appt availability in office) / [] Appointment(In office/virtual)/ []  [place] [facility]/ [] Home Care/ [] Refused Recommended Disposition /[] [facility]/ []  Follow-up with PCP Additional Notes: Requests refill on Breo Ellipta. Has been out since November. States information needs to be faxed for "[facility]". Asking if the practice has any samples. Pt. "In between insurance." Please advise pt.   Answer Assessment - Initial Assessment Questions 1. RESPIRATORY STATUS: "Describe your breathing?" (e.g., wheezing, shortness of breath, unable to speak, severe coughing)      Short of breath, COVID positive 03/05/21 2. ONSET: "When did this breathing problem begin?"      03/05/21 3. PATTERN "Does the difficult breathing come and go, or has it been constant since it started?"      Comes and goes 4. SEVERITY: "How bad is your breathing?" (e.g., mild, moderate, severe)    - MILD: No SOB at rest, mild SOB with walking, speaks normally in sentences, can lie down, no retractions, pulse < 100.    - MODERATE: SOB at rest, SOB with minimal exertion and prefers to sit, cannot lie down flat, speaks in phrases, mild retractions, audible wheezing, pulse 100-120.    - SEVERE: Very SOB at rest, speaks in single words, struggling to breathe, sitting hunched forward, retractions, pulse > 120      Mild 5. RECURRENT SYMPTOM: "Have you had difficulty breathing before?" If Yes, ask: "When was the last time?" and "What happened that time?"      Yes 6. CARDIAC HISTORY: "Do you have any history of heart disease?" (e.g., heart attack, angina, bypass surgery, angioplasty)      No 7. LUNG HISTORY: "Do you have any history of lung disease?"  (e.g., pulmonary embolus, asthma, emphysema)     COPD 8. CAUSE: "What do you think is causing the  breathing problem?"      Been without Breo Ellipta 9. OTHER SYMPTOMS: "Do you have any other symptoms? (e.g., dizziness, runny nose, cough, chest pain, fever)     Cough 10. O2 SATURATION MONITOR:  "Do you use an oxygen saturation monitor (pulse oximeter) at home?" If Yes, "What is your reading (oxygen level) today?" "What is your usual oxygen saturation reading?" (e.g., 95%)       No 11. PREGNANCY: "Is there any chance you are pregnant?" "When was your last menstrual period?"       No 12. TRAVEL: "Have you traveled out of the country in the last month?" (e.g., travel history, exposures)       No  Protocols used: Breathing Difficulty-A-AH

## 2021-03-27 ENCOUNTER — Other Ambulatory Visit: Payer: Self-pay

## 2021-03-27 ENCOUNTER — Encounter: Payer: Self-pay | Admitting: Surgery

## 2021-03-27 ENCOUNTER — Ambulatory Visit (INDEPENDENT_AMBULATORY_CARE_PROVIDER_SITE_OTHER): Payer: 59 | Admitting: Surgery

## 2021-03-27 VITALS — BP 138/72 | HR 72 | Temp 98.1°F

## 2021-03-27 DIAGNOSIS — M4722 Other spondylosis with radiculopathy, cervical region: Secondary | ICD-10-CM

## 2021-03-27 DIAGNOSIS — G5602 Carpal tunnel syndrome, left upper limb: Secondary | ICD-10-CM

## 2021-03-27 NOTE — Progress Notes (Signed)
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  Symptoms unchanged from previous visit.  She is wanting to proceed with C4-C7 ACDF and left open carpal tunnel release as scheduled.  Today history and physical performed.  Review of systems positive for some wheezing.  Patient states that she did not take her inhaler today.  On exam she does have some scattered bilateral expiratory wheezes.  Surgical procedure discussed along with potential recovery time.  Also discussed potential risk of pseudoarthrosis with multilevel fusion.  All questions answered.

## 2021-03-28 NOTE — Pre-Procedure Instructions (Signed)
Surgical Instructions    Your procedure is scheduled on Tuesday, April 02, 2021 at 7:30 AM.  Report to [facility] Main Entrance "A" at 5:30 A.M., then check in with the Admitting office.  Call this number if you have problems the morning of surgery:  [phone]   If you have any questions prior to your surgery date call [phone]: Open Monday-Friday 8am-4pm    Remember:  Do not eat after midnight the night before your surgery  You may drink clear liquids until 4:30 AM the morning of your surgery.   Clear liquids allowed are: Water, Non-Citrus Juices (without pulp), Carbonated Beverages, Clear Tea, Black Coffee Only, and Gatorade   Enhanced Recovery after Surgery for Orthopedics Enhanced Recovery after Surgery is a protocol used to improve the stress on your body and your recovery after surgery.  Patient Instructions  The day of surgery (if you do NOT have diabetes):  Drink ONE (1) Pre-Surgery Clear Ensure by 4:30 am the morning of surgery   This drink was given to you during your hospital  pre-op appointment visit. Nothing else to drink after completing the  Pre-Surgery Clear Ensure.         If you have questions, please contact your surgeons office.     Take these medicines the morning of surgery with A SIP OF WATER:  fluticasone (FLONASE) fluticasone furoate-vilanterol (BREO ELLIPTA) gabapentin (NEURONTIN)  loratadine (CLARITIN)   IF NEEDED: albuterol (PROVENTIL) albuterol (VENTOLIN HFA) ALPRAZolam (XANAX) cyclobenzaprine (FLEXERIL) HYDROcodone-acetaminophen (NORCO/VICODIN)  Please bring all inhalers with you the day of surgery.    As of today, STOP taking any Aspirin (unless otherwise instructed by your surgeon) Aleve, Naproxen, Ibuprofen, Motrin, Advil, meloxicam (MOBIC), Goody's, BC's, all herbal medications, fish oil, and all vitamins.                     Do NOT Smoke (Tobacco/Vaping) or drink Alcohol 24 hours prior to your procedure.  If you use a  CPAP at night, you may bring all equipment for your overnight stay.   Contacts, glasses, piercing's, hearing aid's, dentures or partials may not be worn into surgery, please bring cases for these belongings.    For patients admitted to the hospital, discharge time will be determined by your treatment team.   Patients discharged the day of surgery will not be allowed to drive home, and someone needs to stay with them for 24 hours.  NO VISITORS WILL BE ALLOWED IN PRE-OP WHERE PATIENTS GET READY FOR SURGERY.  ONLY 1 SUPPORT PERSON MAY BE PRESENT IN THE WAITING ROOM WHILE YOU ARE IN SURGERY.  IF YOU ARE TO BE ADMITTED, ONCE YOU ARE IN YOUR ROOM YOU WILL BE ALLOWED TWO (2) VISITORS.  Minor children may have two parents present. Special consideration for safety and communication needs will be reviewed on a case by case basis.   Special instructions:   [facility]- Preparing For Surgery  Before surgery, you can play an important role. Because skin is not sterile, your skin needs to be as free of germs as possible. You can reduce the number of germs on your skin by washing with CHG (chlorahexidine gluconate) Soap before surgery.  CHG is an antiseptic cleaner which kills germs and bonds with the skin to continue killing germs even after washing.    Oral Hygiene is also important to reduce your risk of infection.  Remember - BRUSH YOUR TEETH THE MORNING OF SURGERY WITH YOUR REGULAR TOOTHPASTE  Please do not  use if you have an allergy to CHG or antibacterial soaps. If your skin becomes reddened/irritated stop using the CHG.  Do not shave (including legs and underarms) for at least 48 hours prior to first CHG shower. It is OK to shave your face.  Please follow these instructions carefully.   Shower the NIGHT BEFORE SURGERY and the MORNING OF SURGERY  If you chose to wash your hair, wash your hair first as usual with your normal shampoo.  After you shampoo, rinse your hair and body thoroughly to remove  the shampoo.  Use CHG Soap as you would any other liquid soap. You can apply CHG directly to the skin and wash gently with a scrungie or a clean washcloth.   Apply the CHG Soap to your body ONLY FROM THE NECK DOWN.  Do not use on open wounds or open sores. Avoid contact with your eyes, ears, mouth and genitals (private parts). Wash Face and genitals (private parts)  with your normal soap.   Wash thoroughly, paying special attention to the area where your surgery will be performed.  Thoroughly rinse your body with warm water from the neck down.  DO NOT shower/wash with your normal soap after using and rinsing off the CHG Soap.  Pat yourself dry with a CLEAN TOWEL.  Wear CLEAN PAJAMAS to bed the night before surgery  Place CLEAN SHEETS on your bed the night before your surgery  DO NOT SLEEP WITH PETS.   Day of Surgery: Shower with CHG soap. Do not wear jewelry, make up, nail polish, gel polish, artificial nails, or any other type of covering on natural nails including finger and toenails. If patients have artificial nails, gel coating, etc. that need to be removed by a nail salon please have this removed prior to surgery. Surgery may need to be canceled/delayed if the surgeon/ anesthesia feels like the patient is unable to be adequately monitored. Do not wear lotions, powders, perfumes, or deodorant. Do not shave 48 hours prior to surgery. Do not bring valuables to the hospital. [facility] is not responsible for any belongings or valuables. Wear Clean/Comfortable clothing the morning of surgery Remember to brush your teeth WITH YOUR REGULAR TOOTHPASTE.   Please read over the following fact sheets that you were given.   3 days prior to your procedure or After your COVID test   You are not required to quarantine however you are required to wear a well-fitting mask when you are out and around people not in your household. If your mask becomes wet or soiled, replace with a new  one.   Wash your hands often with soap and water for 20 seconds or clean your hands with an alcohol-based hand sanitizer that contains at least 60% alcohol.   Do not share personal items.   Notify your provider:  o if you are in close contact with someone who has COVID  o or if you develop a fever of 100.4 or greater, sneezing, cough, sore throat, shortness of breath or body aches.

## 2021-03-29 ENCOUNTER — Other Ambulatory Visit: Payer: Self-pay

## 2021-03-29 ENCOUNTER — Encounter (HOSPITAL_COMMUNITY): Payer: Self-pay

## 2021-03-29 ENCOUNTER — Encounter (HOSPITAL_COMMUNITY)
Admission: RE | Admit: 2021-03-29 | Discharge: 2021-03-29 | Disposition: A | Discharge status: Home / Self Care | Payer: 59 | Source: Ambulatory Visit | Attending: Specialist | Admitting: Specialist

## 2021-03-29 VITALS — BP 140/83 | HR 92 | Temp 97.4°F | Resp 18 | Ht 69.0 in | Wt 274.0 lb

## 2021-03-29 DIAGNOSIS — U071 COVID-19: Secondary | ICD-10-CM | POA: Diagnosis not present

## 2021-03-29 DIAGNOSIS — Z01818 Encounter for other preprocedural examination: Secondary | ICD-10-CM | POA: Insufficient documentation

## 2021-03-29 HISTORY — DX: COVID-19: U07.1

## 2021-03-29 LAB — COMPREHENSIVE METABOLIC PANEL
ALT: 21 U/L (ref 0–44)
AST: 22 U/L (ref 15–41)
Albumin: 3.7 g/dL (ref 3.5–5.0)
Alkaline Phosphatase: 52 U/L (ref 38–126)
Anion gap: 10 (ref 5–15)
BUN: 19 mg/dL (ref 6–20)
CO2: 30 mmol/L (ref 22–32)
Calcium: 9.1 mg/dL (ref 8.9–10.3)
Chloride: 102 mmol/L (ref 98–111)
Creatinine, Ser: 0.65 mg/dL (ref 0.44–1.00)
GFR, Estimated: >60 mL/min (ref >60)
Glucose, Bld: 106 mg/dL — ABNORMAL HIGH (ref 70–99)
Potassium: 4.3 mmol/L (ref 3.5–5.1)
Sodium: 142 mmol/L (ref 135–145)
Total Bilirubin: 0.6 mg/dL (ref 0.3–1.2)
Total Protein: 6 g/dL — ABNORMAL LOW (ref 6.5–8.1)

## 2021-03-29 LAB — TYPE AND SCREEN
ABO/RH(D): A POS
Antibody Screen: NEGATIVE

## 2021-03-29 LAB — CBC
HCT: 39.9 % (ref 36.0–46.0)
Hemoglobin: 12.5 g/dL (ref 12.0–15.0)
MCH: 30.6 pg (ref 26.0–34.0)
MCHC: 31.3 g/dL (ref 30.0–36.0)
MCV: 97.6 fL (ref 80.0–100.0)
Platelets: 167 10*3/uL (ref 150–400)
RBC: 4.09 MIL/uL (ref 3.87–5.11)
RDW: 13.2 % (ref 11.5–15.5)
WBC: 7.3 10*3/uL (ref 4.0–10.5)
nRBC: 0 % (ref 0.0–0.2)

## 2021-03-29 LAB — SURGICAL PCR SCREEN
MRSA, PCR: NEGATIVE
Staphylococcus aureus: NEGATIVE

## 2021-03-29 NOTE — Progress Notes (Signed)
PCP - Tally Joe FNP Cardiologist - Denies  PPM/ICD - Denies  Chest x-ray - N/A EKG - 03/29/21 Stress Test - Denies ECHO - Denies Cardiac Cath - Denies  Sleep Study - Denies  Diabetic: Denies  Blood Thinner Instructions: N/A Aspirin Instructions: N/A  ERAS Protcol - Yes, PRE-SURGERY Ensure  COVID TEST- 03/29/21 in PAT   Anesthesia review: Yes, abnormal EKG  Patient denies shortness of breath, fever, cough and chest pain at PAT appointment   All instructions explained to the patient, with a verbal understanding of the material. Patient agrees to go over the instructions while at home for a better understanding. Patient also instructed to self quarantine after being tested for COVID-19. The opportunity to ask questions was provided.

## 2021-03-30 LAB — SARS CORONAVIRUS 2 (TAT 6-24 HRS): SARS Coronavirus 2: POSITIVE — AB

## 2021-03-30 NOTE — Progress Notes (Signed)
Dr. Phoebe Sharps PA notified of positive covid test.

## 2021-03-31 ENCOUNTER — Other Ambulatory Visit: Payer: Self-pay | Admitting: Specialist

## 2021-04-01 ENCOUNTER — Telehealth: Payer: Self-pay

## 2021-04-01 ENCOUNTER — Ambulatory Visit: Payer: Self-pay | Admitting: *Deleted

## 2021-04-01 NOTE — Telephone Encounter (Signed)
Left message for patient to contact office back. Tally Joe, FNP is not in office today will forward message to MD to review. When patient returns call [facility] nurse triage please triage patient for covid symptoms. When did symptoms initially start? What day did patient test? What symptoms is she currently having? Has she taken any otc medication for relief? What pharmacy does patient use? KW

## 2021-04-01 NOTE — Telephone Encounter (Signed)
Triage encounter created, no answer, voicemail full, will attempt again today.

## 2021-04-01 NOTE — Telephone Encounter (Signed)
Voice mail box is full, unable to leave message.

## 2021-04-01 NOTE — Telephone Encounter (Signed)
Called pt to discuss COVID positive symptoms, no answer, voicemail full. Will call back later.

## 2021-04-01 NOTE — Telephone Encounter (Signed)
Pt called to discuss COVID positive symptoms, no answer, full mail full. This was third attempt, will route to office.

## 2021-04-01 NOTE — Telephone Encounter (Signed)
Copied from [place] [id number]. Topic: General - Other >> Apr 01, 2021  9:20 AM Valere Dross wrote: Reason for CRM: Pt called in stating she was suppose to have an appt tomorrow for her referral procedure and tested positive for covid, and was advised, to speak with PCP, please advise.

## 2021-04-02 DIAGNOSIS — Z01818 Encounter for other preprocedural examination: Secondary | ICD-10-CM

## 2021-04-03 ENCOUNTER — Ambulatory Visit: Payer: Self-pay | Admitting: *Deleted

## 2021-04-03 ENCOUNTER — Telehealth: Payer: 59 | Admitting: Family

## 2021-04-03 DIAGNOSIS — B9689 Other specified bacterial agents as the cause of diseases classified elsewhere: Secondary | ICD-10-CM | POA: Diagnosis not present

## 2021-04-03 DIAGNOSIS — J208 Acute bronchitis due to other specified organisms: Secondary | ICD-10-CM | POA: Diagnosis not present

## 2021-04-03 DIAGNOSIS — Z8616 Personal history of COVID-19: Secondary | ICD-10-CM

## 2021-04-03 MED ORDER — DOXYCYCLINE HYCLATE 100 MG PO TABS: 100.0000 mg | ORAL_TABLET | Freq: Two times a day (BID) | ORAL | 0 refills | Status: DC

## 2021-04-03 MED ORDER — PREDNISONE 10 MG (21) PO TBPK: ORAL_TABLET | ORAL | 0 refills | Status: DC

## 2021-04-03 NOTE — Telephone Encounter (Signed)
Contacted patient again she states that she had no calls from our office. I inquired if patient had been seen or evaluated she states that she had a virtual visit today and was prescribed prednisone and antibiotic. Megan Jimenez

## 2021-04-03 NOTE — Telephone Encounter (Signed)
Pt called today and stated that [facility] ([employer] Mai[employer] has not received anything from the office about the pts refill for [facility] / pt needs the letter that was completed to be faxed to them again asap as well as the actual prescription / pt can not [facility] from [facility] / due to her getting it from a third party assistance program / pt has not picked up RX [facility] [address] / please fax letter and RX for fluticasone furoate-vilanterol (BREO ELLIPTA) 200-25 MCG/ACT AEPB to [phone] asap / pt now has covid and really needs inhaler

## 2021-04-03 NOTE — Telephone Encounter (Signed)
°  Chief Complaint: possible secondary infection due to COVID-12/20, and tested + COVID at preop 1/13 Symptoms: cough, fatigue, scratchy throat, wheezing Frequency: started Monday Pertinent Negatives: Patient denies fever Disposition: [] ED /[] Urgent Care (no appt availability in office) / [] Appointment(In office/virtual)/ [x]  [facility]/ [] Home Care/ [] Refused Recommended Disposition /[] [place] [facility]/ []  Follow-up with PCP Additional Notes:   Patient has surgery scheduled in 10 days and needs to get better- virtual appointment scheduled.

## 2021-04-03 NOTE — Progress Notes (Signed)
Virtual Visit Consent   [name], you are scheduled for a virtual visit with a [facility] provider today.     Just as with appointments in the office, your consent must be obtained to participate.  Your consent will be active for this visit and any virtual visit you may have with one of our providers in the next 365 days.     If you have a MyChart account, a copy of this consent can be sent to you electronically.  All virtual visits are billed to your insurance company just like a traditional visit in the office.    As this is a virtual visit, video technology does not allow for your provider to perform a traditional examination.  This may limit your provider's ability to fully assess your condition.  If your provider identifies any concerns that need to be evaluated in person or the need to arrange testing (such as labs, EKG, etc.), we will make arrangements to do so.     Although advances in technology are sophisticated, we cannot ensure that it will always work on either your end or our end.  If the connection with a video visit is poor, the visit may have to be switched to a telephone visit.  With either a video or telephone visit, we are not always able to ensure that we have a secure connection.     I need to obtain your verbal consent now.   Are you willing to proceed with your visit today?    Megan Jimenez has provided verbal consent on 04/03/2021 for a virtual visit (video or telephone).   Evelina Dun, FNP   Date: 04/03/2021 12:22 PM   Virtual Visit via Video Note   I, Evelina Dun, connected with  Megan Jimenez  ([id number], 1965-09-18) on 04/03/21 at 12:15 PM EST by a video-enabled telemedicine application and verified that I am speaking with the correct person using two identifiers.  Location: Patient: Virtual Visit Location Patient: Home Provider: Virtual Visit Location Provider: Home Office   I discussed the limitations of evaluation and management by  telemedicine and the availability of in person appointments. The patient expressed understanding and agreed to proceed.    History of Present Illness: Megan Jimenez is a 56 y.o. who identifies as a female who was assigned female at birth, and is being seen today for cough. She had COVID 03/05/21. After a week she was feeling better, however, retested positive for COVID 03/29/21. Has mild congestion, cough.  HPI: Cough This is a recurrent problem. The current episode started 1 to 4 weeks ago. The problem has been gradually worsening. The problem occurs every few minutes. The cough is Productive of sputum. Associated symptoms include chills, ear congestion, ear pain (improved), headaches, nasal congestion, postnasal drip, shortness of breath and wheezing. Pertinent negatives include no fever or myalgias. She has tried rest and OTC cough suppressant for the symptoms. The treatment provided mild relief.   Problems:  Patient Active Problem List   Diagnosis Date Noted   Carpal tunnel syndrome, left upper limb 02/28/2021   Genetic testing 12/12/2020   Osteoarthritis of cervical spine 11/29/2020   Osteoarthritis of thoracic spine 11/29/2020   Abnormal gait 11/29/2020   Numbness and tingling 11/29/2020   Need for shingles vaccine 11/29/2020   Need for hepatitis C screening test 11/29/2020   Weakness of both lower extremities 11/29/2020   Muscle spasm of back 11/29/2020   Mild intermittent asthma without complication 44/31/5400  Family history of breast cancer 11/28/2020   Family history of ovarian cancer 11/28/2020   Family history of colon cancer 11/28/2020   Family history of throat cancer 11/28/2020   Family history of lung cancer 11/28/2020   Family history of leukemia 11/28/2020   Allergic rhinitis 11/17/2014   Anxiety 09/08/2014   Acute asthma exacerbation 09/08/2014   Affective bipolar disorder ([place]) 09/08/2014   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/08/2014   Headache, migraine 09/08/2014    Excess, menstruation 09/08/2014    Allergies: No Known Allergies Medications:  Current Outpatient Medications:    albuterol (PROVENTIL) (2.5 MG/3ML) 0.083% nebulizer solution, Take 3 mLs (2.5 mg total) by nebulization every 6 (six) hours as needed for wheezing or shortness of breath., Disp: 75 mL, Rfl: 0   albuterol (VENTOLIN HFA) 108 (90 Base) MCG/ACT inhaler, Inhale 1-2 puffs into the lungs every 4 (four) hours as needed for wheezing or shortness of breath., Disp: 18 each, Rfl: 6   ALPRAZolam (XANAX) 1 MG tablet, Take 1 mg by mouth 4 (four) times daily as needed for anxiety., Disp: , Rfl:    Ascorbic Acid (VITAMIN C) 1000 MG tablet, Take 1,000 mg by mouth daily., Disp: , Rfl:    B Complex-C (B-COMPLEX WITH VITAMIN C) tablet, Take 1 tablet by mouth daily., Disp: , Rfl:    CAPLYTA 42 MG CAPS, Take 42 mg by mouth at bedtime., Disp: , Rfl:    cyclobenzaprine (FLEXERIL) 5 MG tablet, Take 1 tablet (5 mg total) by mouth 3 (three) times daily as needed for muscle spasms., Disp: 30 tablet, Rfl: 1   doxycycline (VIBRA-TABS) 100 MG tablet, Take 1 tablet (100 mg total) by mouth 2 (two) times daily., Disp: 20 tablet, Rfl: 0   Eszopiclone 3 MG TABS, TAKE 1 TABLET (3 MG TOTAL) BY MOUTH AT BEDTIME. TAKE IMMEDIATELY BEFORE BEDTIME, Disp: 90 tablet, Rfl: 0   ferrous sulfate 325 (65 FE) MG tablet, Take 325 mg by mouth daily with breakfast., Disp: , Rfl:    fluticasone (FLONASE) 50 MCG/ACT nasal spray, Place 2 sprays into both nostrils daily., Disp: 16 g, Rfl: 2   fluticasone furoate-vilanterol (BREO ELLIPTA) 200-25 MCG/ACT AEPB, Inhale 1 puff into the lungs daily., Disp: 60 each, Rfl: 5   gabapentin (NEURONTIN) 300 MG capsule, TAKE 1 CAPSULE BY MOUTH AT BEDTIME FOR 7 DAYS, THEN 1 CAPSULE 2 TIMES DAILY FOR 22 DAYS. (Patient taking differently: Take 300 mg by mouth 2 (two) times daily.), Disp: 51 capsule, Rfl: 0   HYDROcodone-acetaminophen (NORCO/VICODIN) 5-325 MG tablet, Take 1 tablet by mouth every 6 (six) hours as  needed for moderate pain., Disp: 30 tablet, Rfl: 0   loratadine (CLARITIN) 10 MG tablet, Take 10 mg by mouth daily., Disp: , Rfl:    magnesium gluconate (MAGONATE) 500 MG tablet, Take 500 mg by mouth daily., Disp: , Rfl:    meloxicam (MOBIC) 15 MG tablet, TAKE 1 TABLET (15 MG TOTAL) BY MOUTH DAILY., Disp: 30 tablet, Rfl: 2   montelukast (SINGULAIR) 10 MG tablet, Take 1 tablet (10 mg total) by mouth at bedtime., Disp: 90 tablet, Rfl: 3   Multiple Vitamin (MULTIVITAMIN WITH MINERALS) TABS tablet, Take 1 tablet by mouth daily., Disp: , Rfl:    predniSONE (STERAPRED UNI-PAK 21 TAB) 10 MG (21) TBPK tablet, Use as directed, Disp: 21 tablet, Rfl: 0   vitamin B-12 (CYANOCOBALAMIN) 500 MCG tablet, Take 500 mcg by mouth daily., Disp: , Rfl:    Vitamin D, Ergocalciferol, (DRISDOL) 50000 units CAPS capsule, Take 50,000 Units  by mouth every Friday. , Disp: , Rfl: 5  Observations/Objective: Patient is well-developed, well-nourished in no acute distress.  Resting comfortably  at home.  Head is normocephalic, atraumatic.  No labored breathing.  Speech is clear and coherent with logical content.  Patient is alert and oriented at baseline.  Nasal congestion   Assessment and Plan: 1. History of COVID-19  2. Acute bacterial bronchitis - predniSONE (STERAPRED UNI-PAK 21 TAB) 10 MG (21) TBPK tablet; Use as directed  Dispense: 21 tablet; Refill: 0 - doxycycline (VIBRA-TABS) 100 MG tablet; Take 1 tablet (100 mg total) by mouth 2 (two) times daily.  Dispense: 20 tablet; Refill: 0  - Take meds as prescribed - Use a cool mist humidifier  -Use saline nose sprays frequently -Force fluids -For any cough or congestion  Use plain Mucinex- regular strength or max strength is fine -For fever or aces or pains- take tylenol or ibuprofen. -Throat lozenges if help -New toothbrush in 3 days Follow up if symptoms worsen or do not improve  Follow Up Instructions: I discussed the assessment and treatment plan with the  patient. The patient was provided an opportunity to ask questions and all were answered. The patient agreed with the plan and demonstrated an understanding of the instructions.  A copy of instructions were sent to the patient via MyChart unless otherwise noted below.     The patient was advised to call back or seek an in-person evaluation if the symptoms worsen or if the condition fails to improve as anticipated.  Time:  I spent 12 minutes with the patient via telehealth technology discussing the above problems/concerns.    Evelina Dun, FNP

## 2021-04-03 NOTE — Telephone Encounter (Signed)
Reason for Disposition  [1] PERSISTING SYMPTOMS OF COVID-19 AND [2] symptoms WORSE  Answer Assessment - Initial Assessment Questions 1. COVID-19 DIAGNOSIS: "Who made your COVID-19 diagnosis?" "Was it confirmed by a positive lab test or self-test?" If not diagnosed by a doctor (or NP/PA), ask "Are there lots of cases (community spread) where you live?" Note: See public health department website, if unsure.     12/20 + COVID- patient improved and tested negative at home- patient tested + COVID at preop 2. COVID-19 EXPOSURE: "Was there any known exposure to COVID before the symptoms began?" CDC Definition of close contact: within 6 feet (2 meters) for a total of 15 minutes or more over a 24-hour period.      Possible home exposure- husband 3. ONSET: "When did the COVID-19 symptoms start?"      12/20 4. WORST SYMPTOM: "What is your worst symptom?" (e.g., cough, fever, shortness of breath, muscle aches)     Congestion present 5. COUGH: "Do you have a cough?" If Yes, ask: "How bad is the cough?"       Yes- productive cough yesterday- clear 6. FEVER: "Do you have a fever?" If Yes, ask: "What is your temperature, how was it measured, and when did it start?"     no 7. RESPIRATORY STATUS: "Describe your breathing?" (e.g., shortness of breath, wheezing, unable to speak)      Wheezing- patient has not used inhaler today- inhaler and medication helps that 8. BETTER-SAME-WORSE: "Are you getting better, staying the same or getting worse compared to yesterday?"  If getting worse, ask, "In what way?"     Worse- increasing symptoms 9. HIGH RISK DISEASE: "Do you have any chronic medical problems?" (e.g., asthma, heart or lung disease, weak immune system, obesity, etc.)     asthma 10. VACCINE: "Have you had the COVID-19 vaccine?" If Yes, ask: "Which one, how many shots, when did you get it?"       *No Answer* 11. BOOSTER: "Have you received your COVID-19 booster?" If Yes, ask: "Which one and when did you get  it?"       *No Answer* 12. PREGNANCY: "Is there any chance you are pregnant?" "When was your last menstrual period?"       *No Answer* 13. OTHER SYMPTOMS: "Do you have any other symptoms?"  (e.g., chills, fatigue, headache, loss of smell or taste, muscle pain, sore throat)       Headache, cough 14. O2 SATURATION MONITOR:  "Do you use an oxygen saturation monitor (pulse oximeter) at home?" If Yes, ask "What is your reading (oxygen level) today?" "What is your usual oxygen saturation reading?" (e.g., 95%)       *No Answer*  Protocols used: Coronavirus (COVID-19) Diagnosed or Suspected-A-AH, Coronavirus (COVID-19) Persisting Symptoms Follow-up Call-A-[facility]

## 2021-04-03 NOTE — Telephone Encounter (Signed)
Reprinted and left in your box to sign again. KW

## 2021-04-04 ENCOUNTER — Other Ambulatory Visit: Payer: Self-pay | Admitting: Specialist

## 2021-04-04 MED ORDER — HYDROCODONE-ACETAMINOPHEN 5-325 MG PO TABS: 1.0000 | ORAL_TABLET | Freq: Four times a day (QID) | ORAL | 0 refills | Status: DC | PRN

## 2021-04-04 NOTE — Telephone Encounter (Signed)
Pt called requesting a refill of hydrocodone. Please send to pharmacy on file. Please call pt when sent in at [phone].

## 2021-04-09 NOTE — Telephone Encounter (Addendum)
Verdis Frederickson, from the [place] Patient Assistance Program, stated they need an actual prescription written for the medication fluticasone furoate-vilanterol (BREO ELLIPTA) 200-25 MCG/ACT AEPB with a 90 day supply and 3 refills. Ink signature cannot be an Brewing technologist. Stated a cover sheet is required.  Verdis Frederickson, stated they did receive the letter, but they need an actual prescription written by PCP.     Fax number - [phone]   Please advise pt is in need of her medication.

## 2021-04-09 NOTE — Telephone Encounter (Signed)
Can you write prescription on a script pad and I will fax in morning? Thanks. KW

## 2021-04-10 ENCOUNTER — Other Ambulatory Visit: Payer: Self-pay | Admitting: Family Medicine

## 2021-04-10 NOTE — Telephone Encounter (Signed)
Documentatin has been faxed. KW

## 2021-04-15 ENCOUNTER — Other Ambulatory Visit: Payer: Self-pay

## 2021-04-16 ENCOUNTER — Other Ambulatory Visit: Payer: Self-pay | Admitting: Specialist

## 2021-04-22 ENCOUNTER — Other Ambulatory Visit: Payer: Self-pay

## 2021-04-22 ENCOUNTER — Encounter (HOSPITAL_COMMUNITY): Payer: Self-pay | Admitting: Specialist

## 2021-04-22 NOTE — Pre-Procedure Instructions (Signed)
[id number] - [place], [place] - [address] [address] Phone: [phone] Fax: [phone]  PCP - Tally Joe FNP Cardiologist - Denies   PPM/ICD - Denies   Chest x-ray - N/A EKG - 03/29/21 Stress Test - Denies ECHO - Denies Cardiac Cath - Denies   Sleep Study - Denies   Diabetic: Denies   Blood Thinner Instructions: N/A Aspirin Instructions: N/A   ERAS Protcol - Yes, PRE-SURGERY Ensure   COVID TEST- Positive on 03/29/21   Anesthesia review: Yes, abnormal EKG  Patient verbally denies any shortness of breath, fever, cough and chest pain during phone call   -------------  SDW INSTRUCTIONS given:  Your procedure is scheduled on 04/23/21.  Report to [facility] Main Entrance "A" at 0530 A.M., and check in at the Admitting office.  Call this number if you have problems the morning of surgery:  [phone]   Remember:  Do not eat after midnight the night before your surgery  You may drink clear liquids until 0430 the morning of your surgery. You must finish the Pre-Surgery Clear Ensure by 0430. Clear liquids allowed are: Water, Non-Citrus Juices (without pulp), Carbonated Beverages, Clear Tea, Black Coffee Only, and Gatorade    Take these medicines the morning of surgery with A SIP OF WATER:   fluticasone (FLONASE) fluticasone furoate-vilanterol (BREO ELLIPTA) gabapentin (NEURONTIN)  loratadine (CLARITIN)    IF NEEDED: albuterol (PROVENTIL) albuterol (VENTOLIN HFA) ALPRAZolam (XANAX) cyclobenzaprine (FLEXERIL) HYDROcodone-acetaminophen (NORCO/VICODIN)   Please bring all inhalers with you the day of surgery.      As of today, STOP taking any Aspirin (unless otherwise instructed by your surgeon) Aleve, Naproxen, Ibuprofen, Motrin, Advil, meloxicam (MOBIC), Goody's, BC's, all herbal medications, fish oil, and all vitamins.                      Do not wear jewelry, make up, or nail polish            Do not wear lotions,  powders, perfumes/colognes, or deodorant.            Do not shave 48 hours prior to surgery.  Men may shave face and neck.            Do not bring valuables to the hospital.            [facility] is not responsible for any belongings or valuables.  Do NOT Smoke (Tobacco/Vaping) 24 hours prior to your procedure If you use a CPAP at night, you may bring all equipment for your overnight stay.   Contacts, glasses, dentures or bridgework may not be worn into surgery.      For patients admitted to the hospital, discharge time will be determined by your treatment team.   Patients discharged the day of surgery will not be allowed to drive home, and someone needs to stay with them for 24 hours.    Special instructions:   [facility]- Preparing For Surgery  Before surgery, you can play an important role. Because skin is not sterile, your skin needs to be as free of germs as possible. You can reduce the number of germs on your skin by washing with CHG (chlorahexidine gluconate) Soap before surgery.  CHG is an antiseptic cleaner which kills germs and bonds with the skin to continue killing germs even after washing.    Oral Hygiene is also important to reduce your risk of infection.  Remember - BRUSH YOUR TEETH  THE MORNING OF SURGERY WITH YOUR REGULAR TOOTHPASTE  Please do not use if you have an allergy to CHG or antibacterial soaps. If your skin becomes reddened/irritated stop using the CHG.  Do not shave (including legs and underarms) for at least 48 hours prior to first CHG shower. It is OK to shave your face.  Please follow these instructions carefully.   Shower the NIGHT BEFORE SURGERY and the MORNING OF SURGERY with DIAL Soap.   Pat yourself dry with a CLEAN TOWEL.  Wear CLEAN PAJAMAS to bed the night before surgery  Place CLEAN SHEETS on your bed the night of your first shower and DO NOT SLEEP WITH PETS.   Day of Surgery: Please shower morning of surgery  Wear Clean/Comfortable  clothing the morning of surgery Do not apply any deodorants/lotions.   Remember to brush your teeth WITH YOUR REGULAR TOOTHPASTE.   Questions were answered. Patient verbalized understanding of instructions.

## 2021-04-22 NOTE — Addendum Note (Signed)
Addendum  created 04/22/21 1617 by Jacinta Shoe, PA-C   Clinical Note Signed, Order Reconciliation Section accessed

## 2021-04-22 NOTE — Progress Notes (Signed)
Anesthesia Chart Review:  Case: [id number] Date/Time: 04/23/21 0715   Procedures:      ANTERIOR CERVICAL DISCECTOMIES AND FUSION C4-5, C5-6 AND C6-7 WITH PLATES, SCREWS, ALLOGRAFT ACDF GRAFT, LOCAL BONE GRAFT, VIVIGEN     LEFT OPEN CARPAL TUNNEL RELEASE (Left)   Anesthesia type: General   Pre-op diagnosis: severe cervical spondylosis bilateral C4-5, C5-6 and C6-7   Location: [facility] OR ROOM 05 / [place] OR   Surgeons: Jessy Oto, MD       DISCUSSION: Patient is a 56 year old female scheduled for the above procedure. Surgery was initially planned for 04/02/21, but her 03/29/21 presurgical COVID-19 test was positive and surgery rescheduled for 04/23/21. By 04/03/21 post-COVID virtual visit, she actually had COVID 03/05/21 and was feeling better, but then had mild cough and congestion after + COVID-19 test on 03/29/21. She was treated with doxycycline and prednisone taper. No cough or SOB per PAT RN phone interview.   Other history includes former smoker (quit 03/17/04), asthma, Bipolar 1 disorder, cholecystectomy, obesity.    CMET and CBC from 03/29/21 noted. T&S is expired, so will need to be redone on the day of surgery.   Anesthesia team to evaluate on the day of surgery.   VS: BP 140/83    Pulse 92    Temp (!) 36.3 C (Oral)    Resp 18    Ht 5\' 9"  (1.753 m)    Wt 124.3 kg    LMP 09/28/2018    SpO2 97%    BMI 40.46 kg/m   PROVIDERS: Gwyneth Sprout, FNP is PCP    LABS: See DISCUSSION. A1c 5.8% 11/29/20.  (all labs ordered are listed, but only abnormal results are displayed)  Labs Reviewed  SARS CORONAVIRUS 2 (TAT 6-24 HRS) - Abnormal; Notable for the following components:      Result Value   SARS Coronavirus 2 POSITIVE (*)    All other components within normal limits  COMPREHENSIVE METABOLIC PANEL - Abnormal; Notable for the following components:   Glucose, Bld 106 (*)    Total Protein 6.0 (*)    All other components within normal limits  SURGICAL PCR SCREEN  CBC  TYPE AND SCREEN      IMAGES: MRI C-spine 01/10/21: IMPRESSION: 1. Severe left foraminal stenosis at C4-C5 and C5-C6. Moderate foraminal stenosis on the left at C3-C4, the right at C4-C5 and C5-C6 and bilaterally at C6-C7. 2. Mild canal stenosis at C3-C4, C4-C5, C5-C6 and C6-C7.  MRI L-spine 01/10/21: IMPRESSION: 1. Mild canal stenosis at L2-L3. 2. Mild foraminal stenosis on the left at L1-L2 and bilaterally at L5-S1. 3. Deformity of the right eccentric L4 superior endplate without marrow edema, potentially a remote fracture or Schmorl's node.   EKG: 03/29/21: Normal sinus rhythm Nonspecific T wave abnormality Abnormal ECG When compared with ECG of 21-Jan-2019 20:24, PREVIOUS ECG IS PRESENT Minor nonspecific ST changes New since previous tracing Confirmed by Kirk Ruths [phone]) on 03/30/2021 8:49:34 AM   CV: LUE Venous US 10/02/18: IMPRESSION: 1. Positive for occlusive superficial venous thrombosis within the left basilic vein in the mid and distal upper arm at the site of prior IV insertion. 2. No evidence of deep venous thrombosis.   Past Medical History:  Diagnosis Date   Asthma    Bipolar 1 disorder ([place])    COVID-19 03/29/2021   Family history of breast cancer    Family history of colon cancer    Family history of leukemia    Family history of  lung cancer    Family history of ovarian cancer    Family history of throat cancer     Past Surgical History:  Procedure Laterality Date   ANKLE FRACTURE SURGERY Right 1992   CHOLECYSTECTOMY     COLONOSCOPY WITH PROPOFOL N/A 07/09/2020   Procedure: COLONOSCOPY WITH PROPOFOL;  Surgeon: Jonathon Bellows, MD;  Location: [facility] ENDOSCOPY;  Service: Gastroenterology;  Laterality: N/A;    MEDICATIONS:  albuterol (PROVENTIL) (2.5 MG/3ML) 0.083% nebulizer solution   albuterol (VENTOLIN HFA) 108 (90 Base) MCG/ACT inhaler   ALPRAZolam (XANAX) 1 MG tablet   Ascorbic Acid (VITAMIN C) 1000 MG tablet   B Complex-C (B-COMPLEX WITH VITAMIN C)  tablet   CAPLYTA 42 MG CAPS   cyclobenzaprine (FLEXERIL) 5 MG tablet   doxycycline (VIBRA-TABS) 100 MG tablet   Eszopiclone 3 MG TABS   ferrous sulfate 325 (65 FE) MG tablet   fluticasone (FLONASE) 50 MCG/ACT nasal spray   fluticasone furoate-vilanterol (BREO ELLIPTA) 200-25 MCG/ACT AEPB   gabapentin (NEURONTIN) 300 MG capsule   HYDROcodone-acetaminophen (NORCO/VICODIN) 5-325 MG tablet   loratadine (CLARITIN) 10 MG tablet   magnesium gluconate (MAGONATE) 500 MG tablet   meloxicam (MOBIC) 15 MG tablet   montelukast (SINGULAIR) 10 MG tablet   Multiple Vitamin (MULTIVITAMIN WITH MINERALS) TABS tablet   predniSONE (STERAPRED UNI-PAK 21 TAB) 10 MG (21) TBPK tablet   vitamin B-12 (CYANOCOBALAMIN) 500 MCG tablet   Vitamin D, Ergocalciferol, (DRISDOL) 50000 units CAPS capsule   No current facility-administered medications for this encounter.    Myra Gianotti, PA-C Surgical Short Stay/Anesthesiology [facility] Phone [phone] [facility] Phone [phone] 04/22/2021 4:06 PM

## 2021-04-22 NOTE — Anesthesia Preprocedure Evaluation (Addendum)
Anesthesia Evaluation  Patient identified by MRN, date of birth, ID band Patient awake    Reviewed: Allergy & Precautions, NPO status , Patient's Chart, lab work & pertinent test results  History of Anesthesia Complications Negative for: history of anesthetic complications  Airway Mallampati: III  TM Distance: >3 FB Neck ROM: Full    Dental no notable dental hx. (+) Dental Advisory Given   Pulmonary neg pulmonary ROS, former smoker,  Recent covid   Pulmonary exam normal        Cardiovascular negative cardio ROS Normal cardiovascular exam     Neuro/Psych PSYCHIATRIC DISORDERS Anxiety Depression Bipolar Disorder    GI/Hepatic negative GI ROS, Neg liver ROS,   Endo/Other  Morbid obesity  Renal/GU negative Renal ROS     Musculoskeletal negative musculoskeletal ROS (+)   Abdominal   Peds  Hematology negative hematology ROS (+)   Anesthesia Other Findings   Reproductive/Obstetrics                           Anesthesia Physical Anesthesia Plan  ASA: 3  Anesthesia Plan: General   Post-op Pain Management: Celebrex PO (pre-op), Tylenol PO (pre-op) and Ketamine IV   Induction:   PONV Risk Score and Plan: 4 or greater and Ondansetron, Dexamethasone, Midazolam and Scopolamine patch - Pre-op  Airway Management Planned: Oral ETT  Additional Equipment: None  Intra-op Plan:   Post-operative Plan: Extubation in OR  Informed Consent: I have reviewed the patients History and Physical, chart, labs and discussed the procedure including the risks, benefits and alternatives for the proposed anesthesia with the patient or authorized representative who has indicated his/her understanding and acceptance.     Dental advisory given  Plan Discussed with: Anesthesiologist and CRNA  Anesthesia Plan Comments: (PAT note written 04/22/2021 by Myra Gianotti, PA-C. )      Anesthesia Quick  Evaluation

## 2021-04-23 ENCOUNTER — Inpatient Hospital Stay (HOSPITAL_COMMUNITY): Payer: 59 | Admitting: Physician Assistant

## 2021-04-23 ENCOUNTER — Other Ambulatory Visit: Payer: Self-pay

## 2021-04-23 ENCOUNTER — Encounter (HOSPITAL_COMMUNITY): Payer: Self-pay | Admitting: Specialist

## 2021-04-23 ENCOUNTER — Inpatient Hospital Stay (HOSPITAL_COMMUNITY): Payer: 59

## 2021-04-23 ENCOUNTER — Inpatient Hospital Stay (HOSPITAL_COMMUNITY)
Admission: RE | Admit: 2021-04-23 | Discharge: 2021-04-26 | DRG: 473 | Disposition: A | Discharge status: Home / Self Care | Payer: 59 | Attending: Specialist | Admitting: Specialist

## 2021-04-23 ENCOUNTER — Inpatient Hospital Stay (HOSPITAL_COMMUNITY)
Admission: RE | Disposition: A | Discharge status: Home / Self Care | Payer: Self-pay | Source: Home / Self Care | Attending: Specialist

## 2021-04-23 DIAGNOSIS — M4722 Other spondylosis with radiculopathy, cervical region: Secondary | ICD-10-CM | POA: Diagnosis present

## 2021-04-23 DIAGNOSIS — Z20822 Contact with and (suspected) exposure to covid-19: Secondary | ICD-10-CM | POA: Diagnosis not present

## 2021-04-23 DIAGNOSIS — Z808 Family history of malignant neoplasm of other organs or systems: Secondary | ICD-10-CM

## 2021-04-23 DIAGNOSIS — J208 Acute bronchitis due to other specified organisms: Secondary | ICD-10-CM

## 2021-04-23 DIAGNOSIS — Z803 Family history of malignant neoplasm of breast: Secondary | ICD-10-CM

## 2021-04-23 DIAGNOSIS — Z79899 Other long term (current) drug therapy: Secondary | ICD-10-CM

## 2021-04-23 DIAGNOSIS — Z8041 Family history of malignant neoplasm of ovary: Secondary | ICD-10-CM | POA: Diagnosis not present

## 2021-04-23 DIAGNOSIS — Z825 Family history of asthma and other chronic lower respiratory diseases: Secondary | ICD-10-CM | POA: Diagnosis not present

## 2021-04-23 DIAGNOSIS — J45909 Unspecified asthma, uncomplicated: Secondary | ICD-10-CM | POA: Diagnosis not present

## 2021-04-23 DIAGNOSIS — Z818 Family history of other mental and behavioral disorders: Secondary | ICD-10-CM

## 2021-04-23 DIAGNOSIS — Z801 Family history of malignant neoplasm of trachea, bronchus and lung: Secondary | ICD-10-CM | POA: Diagnosis not present

## 2021-04-23 DIAGNOSIS — Z8616 Personal history of COVID-19: Secondary | ICD-10-CM

## 2021-04-23 DIAGNOSIS — Z806 Family history of leukemia: Secondary | ICD-10-CM | POA: Diagnosis not present

## 2021-04-23 DIAGNOSIS — G5602 Carpal tunnel syndrome, left upper limb: Secondary | ICD-10-CM | POA: Diagnosis not present

## 2021-04-23 DIAGNOSIS — Z83438 Family history of other disorder of lipoprotein metabolism and other lipidemia: Secondary | ICD-10-CM | POA: Diagnosis not present

## 2021-04-23 DIAGNOSIS — M5020 Other cervical disc displacement, unspecified cervical region: Secondary | ICD-10-CM | POA: Diagnosis not present

## 2021-04-23 DIAGNOSIS — M5010 Cervical disc disorder with radiculopathy, unspecified cervical region: Secondary | ICD-10-CM | POA: Diagnosis not present

## 2021-04-23 DIAGNOSIS — F319 Bipolar disorder, unspecified: Secondary | ICD-10-CM | POA: Diagnosis not present

## 2021-04-23 DIAGNOSIS — Z419 Encounter for procedure for purposes other than remedying health state, unspecified: Secondary | ICD-10-CM

## 2021-04-23 DIAGNOSIS — Z800 Family history of malignant neoplasm of digestive organs: Secondary | ICD-10-CM | POA: Diagnosis not present

## 2021-04-23 DIAGNOSIS — Z981 Arthrodesis status: Secondary | ICD-10-CM

## 2021-04-23 DIAGNOSIS — B9689 Other specified bacterial agents as the cause of diseases classified elsewhere: Secondary | ICD-10-CM

## 2021-04-23 DIAGNOSIS — M4802 Spinal stenosis, cervical region: Secondary | ICD-10-CM | POA: Diagnosis present

## 2021-04-23 DIAGNOSIS — Z01818 Encounter for other preprocedural examination: Secondary | ICD-10-CM

## 2021-04-23 DIAGNOSIS — R1310 Dysphagia, unspecified: Secondary | ICD-10-CM

## 2021-04-23 DIAGNOSIS — Z87891 Personal history of nicotine dependence: Secondary | ICD-10-CM

## 2021-04-23 HISTORY — PX: CARPAL TUNNEL RELEASE: SHX101

## 2021-04-23 HISTORY — PX: ANTERIOR CERVICAL DECOMP/DISCECTOMY FUSION: SHX1161

## 2021-04-23 LAB — TYPE AND SCREEN
ABO/RH(D): A POS
Antibody Screen: NEGATIVE

## 2021-04-23 LAB — GLUCOSE, CAPILLARY: Glucose-Capillary: 138 mg/dL — ABNORMAL HIGH (ref 70–99)

## 2021-04-23 SURGERY — ANTERIOR CERVICAL DECOMPRESSION/DISCECTOMY FUSION 3 LEVELS
Anesthesia: General

## 2021-04-23 MED ORDER — METHOCARBAMOL 500 MG PO TABS
500.0000 mg | ORAL_TABLET | Freq: Four times a day (QID) | ORAL | Status: DC | PRN
  Administered 2021-04-23 – 2021-04-25 (×5): 500 mg via ORAL
  Filled 2021-04-23 (×5): qty 1

## 2021-04-23 MED ORDER — CHLORHEXIDINE GLUCONATE CLOTH 2 % EX PADS
6.0000 | MEDICATED_PAD | Freq: Every day | CUTANEOUS | Status: DC
  Administered 2021-04-23 – 2021-04-26 (×4): 6 via TOPICAL

## 2021-04-23 MED ORDER — PROMETHAZINE HCL 25 MG/ML IJ SOLN: 6.2500 mg | INTRAMUSCULAR | Status: DC | PRN

## 2021-04-23 MED ORDER — MENTHOL 3 MG MT LOZG
1.0000 | LOZENGE | OROMUCOSAL | Status: DC | PRN
  Administered 2021-04-25: 3 mg via ORAL
  Filled 2021-04-23: qty 9

## 2021-04-23 MED ORDER — POLYETHYLENE GLYCOL 3350 17 G PO PACK
17.0000 g | PACK | Freq: Every day | ORAL | Status: DC | PRN
  Administered 2021-04-26: 17 g via ORAL
  Filled 2021-04-23: qty 1

## 2021-04-23 MED ORDER — ACETAMINOPHEN 650 MG RE SUPP: 650.0000 mg | RECTAL | Status: DC | PRN

## 2021-04-23 MED ORDER — KETAMINE HCL 50 MG/5ML IJ SOSY
PREFILLED_SYRINGE | INTRAMUSCULAR | Status: AC
  Filled 2021-04-23: qty 5

## 2021-04-23 MED ORDER — PHENYLEPHRINE HCL-NACL 20-0.9 MG/250ML-% IV SOLN
INTRAVENOUS | Status: AC
  Filled 2021-04-23: qty 500

## 2021-04-23 MED ORDER — OXYCODONE HCL ER 10 MG PO T12A
10.0000 mg | EXTENDED_RELEASE_TABLET | Freq: Two times a day (BID) | ORAL | Status: DC
  Administered 2021-04-23 – 2021-04-24 (×2): 10 mg via ORAL
  Filled 2021-04-23 (×2): qty 1

## 2021-04-23 MED ORDER — MIDAZOLAM HCL 2 MG/2ML IJ SOLN
INTRAMUSCULAR | Status: AC
  Filled 2021-04-23: qty 2

## 2021-04-23 MED ORDER — HYDROMORPHONE HCL 1 MG/ML IJ SOLN
INTRAMUSCULAR | Status: DC | PRN
  Administered 2021-04-23: .5 mg via INTRAVENOUS

## 2021-04-23 MED ORDER — VITAMIN D (ERGOCALCIFEROL) 1.25 MG (50000 UNIT) PO CAPS
50000.0000 [IU] | ORAL_CAPSULE | ORAL | Status: DC
  Administered 2021-04-26: 50000 [IU] via ORAL
  Filled 2021-04-23: qty 1

## 2021-04-23 MED ORDER — ADULT MULTIVITAMIN W/MINERALS CH
1.0000 | ORAL_TABLET | Freq: Every day | ORAL | Status: DC
  Administered 2021-04-23 – 2021-04-26 (×4): 1 via ORAL
  Filled 2021-04-23 (×4): qty 1

## 2021-04-23 MED ORDER — MIDAZOLAM HCL 2 MG/2ML IJ SOLN
INTRAMUSCULAR | Status: DC | PRN
Start: 2021-04-23 — End: 2021-04-23
  Administered 2021-04-23: 2 mg via INTRAVENOUS

## 2021-04-23 MED ORDER — PANTOPRAZOLE SODIUM 40 MG IV SOLR
40.0000 mg | Freq: Every day | INTRAVENOUS | Status: DC
  Administered 2021-04-23: 40 mg via INTRAVENOUS
  Filled 2021-04-23: qty 10

## 2021-04-23 MED ORDER — GABAPENTIN 300 MG PO CAPS
300.0000 mg | ORAL_CAPSULE | Freq: Three times a day (TID) | ORAL | Status: DC
  Administered 2021-04-23 – 2021-04-26 (×9): 300 mg via ORAL
  Filled 2021-04-23 (×9): qty 1

## 2021-04-23 MED ORDER — CEFAZOLIN SODIUM-DEXTROSE 2-4 GM/100ML-% IV SOLN
2.0000 g | Freq: Three times a day (TID) | INTRAVENOUS | Status: AC
  Administered 2021-04-23 – 2021-04-24 (×2): 2 g via INTRAVENOUS
  Filled 2021-04-23 (×2): qty 100

## 2021-04-23 MED ORDER — ONDANSETRON HCL 4 MG/2ML IJ SOLN: 4.0000 mg | Freq: Four times a day (QID) | INTRAMUSCULAR | Status: DC | PRN

## 2021-04-23 MED ORDER — ALBUTEROL SULFATE HFA 108 (90 BASE) MCG/ACT IN AERS
INHALATION_SPRAY | RESPIRATORY_TRACT | Status: DC | PRN
Start: 2021-04-23 — End: 2021-04-23
  Administered 2021-04-23: 6 via RESPIRATORY_TRACT

## 2021-04-23 MED ORDER — ONDANSETRON HCL 4 MG/2ML IJ SOLN
INTRAMUSCULAR | Status: DC | PRN
  Administered 2021-04-23: 4 mg via INTRAVENOUS

## 2021-04-23 MED ORDER — BUPIVACAINE HCL 0.5 % IJ SOLN
INTRAMUSCULAR | Status: DC | PRN
  Administered 2021-04-23 (×2): 2.5 mL

## 2021-04-23 MED ORDER — MAGNESIUM GLUCONATE 500 MG PO TABS
500.0000 mg | ORAL_TABLET | Freq: Every day | ORAL | Status: DC
  Administered 2021-04-23 – 2021-04-26 (×4): 500 mg via ORAL
  Filled 2021-04-23 (×4): qty 1

## 2021-04-23 MED ORDER — FENTANYL CITRATE (PF) 250 MCG/5ML IJ SOLN
INTRAMUSCULAR | Status: AC
  Filled 2021-04-23: qty 5

## 2021-04-23 MED ORDER — KETOROLAC TROMETHAMINE 15 MG/ML IJ SOLN
15.0000 mg | Freq: Four times a day (QID) | INTRAMUSCULAR | Status: AC
  Administered 2021-04-23 – 2021-04-24 (×4): 15 mg via INTRAVENOUS
  Filled 2021-04-23 (×4): qty 1

## 2021-04-23 MED ORDER — ALBUTEROL SULFATE (2.5 MG/3ML) 0.083% IN NEBU: 2.5000 mg | INHALATION_SOLUTION | Freq: Four times a day (QID) | RESPIRATORY_TRACT | Status: DC | PRN

## 2021-04-23 MED ORDER — FENTANYL CITRATE (PF) 250 MCG/5ML IJ SOLN
INTRAMUSCULAR | Status: DC | PRN
  Administered 2021-04-23: 50 ug via INTRAVENOUS
  Administered 2021-04-23: 100 ug via INTRAVENOUS
  Administered 2021-04-23: 25 ug via INTRAVENOUS
  Administered 2021-04-23 (×2): 50 ug via INTRAVENOUS
  Administered 2021-04-23: 100 ug via INTRAVENOUS
  Administered 2021-04-23 (×3): 25 ug via INTRAVENOUS
  Administered 2021-04-23: 50 ug via INTRAVENOUS

## 2021-04-23 MED ORDER — ALBUTEROL SULFATE HFA 108 (90 BASE) MCG/ACT IN AERS: 1.0000 | INHALATION_SPRAY | RESPIRATORY_TRACT | Status: DC | PRN

## 2021-04-23 MED ORDER — LACTATED RINGERS IV SOLN: INTRAVENOUS | Status: DC | PRN

## 2021-04-23 MED ORDER — LIDOCAINE 2% (20 MG/ML) 5 ML SYRINGE
INTRAMUSCULAR | Status: DC | PRN
  Administered 2021-04-23: 100 mg via INTRAVENOUS
  Administered 2021-04-23: 60 mg via INTRAVENOUS

## 2021-04-23 MED ORDER — LORATADINE 10 MG PO TABS
10.0000 mg | ORAL_TABLET | Freq: Every day | ORAL | Status: DC
Start: 2021-04-23 — End: 2021-04-26
  Administered 2021-04-23 – 2021-04-26 (×4): 10 mg via ORAL
  Filled 2021-04-23 (×4): qty 1

## 2021-04-23 MED ORDER — SODIUM CHLORIDE 0.9% FLUSH: 3.0000 mL | INTRAVENOUS | Status: DC | PRN

## 2021-04-23 MED ORDER — ONDANSETRON HCL 4 MG PO TABS: 4.0000 mg | ORAL_TABLET | Freq: Four times a day (QID) | ORAL | Status: DC | PRN

## 2021-04-23 MED ORDER — BUPIVACAINE HCL 0.5 % IJ SOLN
INTRAMUSCULAR | Status: AC
  Filled 2021-04-23: qty 1

## 2021-04-23 MED ORDER — KETAMINE HCL 10 MG/ML IJ SOLN
INTRAMUSCULAR | Status: DC | PRN
Start: 2021-04-23 — End: 2021-04-23
  Administered 2021-04-23 (×3): 10 mg via INTRAVENOUS
  Administered 2021-04-23: 20 mg via INTRAVENOUS

## 2021-04-23 MED ORDER — ZOLPIDEM TARTRATE 5 MG PO TABS
5.0000 mg | ORAL_TABLET | Freq: Every evening | ORAL | Status: DC | PRN
  Administered 2021-04-24 – 2021-04-25 (×2): 5 mg via ORAL
  Filled 2021-04-23 (×2): qty 1

## 2021-04-23 MED ORDER — B COMPLEX-C PO TABS
1.0000 | ORAL_TABLET | Freq: Every day | ORAL | Status: DC
  Administered 2021-04-23 – 2021-04-26 (×4): 1 via ORAL
  Filled 2021-04-23 (×4): qty 1

## 2021-04-23 MED ORDER — PHENYLEPHRINE 40 MCG/ML (10ML) SYRINGE FOR IV PUSH (FOR BLOOD PRESSURE SUPPORT)
PREFILLED_SYRINGE | INTRAVENOUS | Status: DC | PRN
  Administered 2021-04-23: 80 ug via INTRAVENOUS
  Administered 2021-04-23: 160 ug via INTRAVENOUS
  Administered 2021-04-23: 120 ug via INTRAVENOUS
  Administered 2021-04-23: 80 ug via INTRAVENOUS

## 2021-04-23 MED ORDER — VITAMIN B-12 100 MCG PO TABS
500.0000 ug | ORAL_TABLET | Freq: Every day | ORAL | Status: DC
  Administered 2021-04-23 – 2021-04-26 (×4): 500 ug via ORAL
  Filled 2021-04-23 (×4): qty 5

## 2021-04-23 MED ORDER — LUMATEPERONE TOSYLATE 42 MG PO CAPS
42.0000 mg | ORAL_CAPSULE | Freq: Every day | ORAL | Status: DC
  Administered 2021-04-23 – 2021-04-25 (×3): 42 mg via ORAL
  Filled 2021-04-23 (×4): qty 1

## 2021-04-23 MED ORDER — SCOPOLAMINE 1 MG/3DAYS TD PT72
1.0000 | MEDICATED_PATCH | TRANSDERMAL | Status: DC
  Administered 2021-04-23: 1.5 mg via TRANSDERMAL
  Filled 2021-04-23: qty 1

## 2021-04-23 MED ORDER — HYDROMORPHONE HCL 1 MG/ML IJ SOLN
0.2500 mg | INTRAMUSCULAR | Status: DC | PRN
  Administered 2021-04-23: 0.5 mg via INTRAVENOUS

## 2021-04-23 MED ORDER — SODIUM CHLORIDE 0.9% FLUSH
3.0000 mL | Freq: Two times a day (BID) | INTRAVENOUS | Status: DC
  Administered 2021-04-24 – 2021-04-25 (×3): 3 mL via INTRAVENOUS

## 2021-04-23 MED ORDER — ALUM & MAG HYDROXIDE-SIMETH 200-200-20 MG/5ML PO SUSP: 30.0000 mL | Freq: Four times a day (QID) | ORAL | Status: DC | PRN

## 2021-04-23 MED ORDER — METHOCARBAMOL 500 MG PO TABS
ORAL_TABLET | ORAL | Status: AC
  Filled 2021-04-23: qty 1

## 2021-04-23 MED ORDER — MONTELUKAST SODIUM 10 MG PO TABS
10.0000 mg | ORAL_TABLET | Freq: Every day | ORAL | Status: DC
  Administered 2021-04-23 – 2021-04-25 (×3): 10 mg via ORAL
  Filled 2021-04-23 (×3): qty 1

## 2021-04-23 MED ORDER — ACETAMINOPHEN 500 MG PO TABS
1000.0000 mg | ORAL_TABLET | Freq: Once | ORAL | Status: AC
Start: 2021-04-23 — End: 2021-04-23
  Administered 2021-04-23: 1000 mg via ORAL
  Filled 2021-04-23: qty 2

## 2021-04-23 MED ORDER — ALBUMIN HUMAN 5 % IV SOLN: INTRAVENOUS | Status: DC | PRN

## 2021-04-23 MED ORDER — PHENOL 1.4 % MT LIQD: 1.0000 | OROMUCOSAL | Status: DC | PRN

## 2021-04-23 MED ORDER — MORPHINE BOLUS VIA INFUSION: 1.0000 mg | INTRAVENOUS | Status: DC | PRN

## 2021-04-23 MED ORDER — DEXAMETHASONE SODIUM PHOSPHATE 10 MG/ML IJ SOLN
INTRAMUSCULAR | Status: DC | PRN
  Administered 2021-04-23: 10 mg via INTRAVENOUS

## 2021-04-23 MED ORDER — BISACODYL 5 MG PO TBEC: 5.0000 mg | DELAYED_RELEASE_TABLET | Freq: Every day | ORAL | Status: DC | PRN

## 2021-04-23 MED ORDER — FENTANYL CITRATE (PF) 100 MCG/2ML IJ SOLN
INTRAMUSCULAR | Status: AC
  Filled 2021-04-23: qty 2

## 2021-04-23 MED ORDER — PROPOFOL 1000 MG/100ML IV EMUL
INTRAVENOUS | Status: AC
  Filled 2021-04-23: qty 100

## 2021-04-23 MED ORDER — GABAPENTIN 300 MG PO CAPS: 300.0000 mg | ORAL_CAPSULE | Freq: Two times a day (BID) | ORAL | Status: DC

## 2021-04-23 MED ORDER — FERROUS SULFATE 325 (65 FE) MG PO TABS
325.0000 mg | ORAL_TABLET | Freq: Every day | ORAL | Status: DC
  Administered 2021-04-24 – 2021-04-26 (×3): 325 mg via ORAL
  Filled 2021-04-23 (×3): qty 1

## 2021-04-23 MED ORDER — FLUTICASONE PROPIONATE 50 MCG/ACT NA SUSP
2.0000 | Freq: Every day | NASAL | Status: DC
  Administered 2021-04-24 – 2021-04-26 (×3): 2 via NASAL
  Filled 2021-04-23: qty 16

## 2021-04-23 MED ORDER — OXYCODONE HCL 5 MG PO TABS
5.0000 mg | ORAL_TABLET | ORAL | Status: DC | PRN
  Administered 2021-04-23 – 2021-04-26 (×9): 5 mg via ORAL
  Filled 2021-04-23 (×9): qty 1

## 2021-04-23 MED ORDER — MORPHINE SULFATE (PF) 2 MG/ML IV SOLN
1.0000 mg | INTRAVENOUS | Status: DC | PRN
  Administered 2021-04-23 – 2021-04-24 (×5): 1 mg via INTRAVENOUS
  Filled 2021-04-23 (×6): qty 1

## 2021-04-23 MED ORDER — THROMBIN 20000 UNITS EX SOLR
CUTANEOUS | Status: DC | PRN
  Administered 2021-04-23: 20 mL via TOPICAL

## 2021-04-23 MED ORDER — PROPOFOL 10 MG/ML IV BOLUS
INTRAVENOUS | Status: DC | PRN
  Administered 2021-04-23: 200 mg via INTRAVENOUS
  Administered 2021-04-23 (×2): 50 mg via INTRAVENOUS

## 2021-04-23 MED ORDER — HYDROCODONE-ACETAMINOPHEN 5-325 MG PO TABS
2.0000 | ORAL_TABLET | ORAL | Status: DC | PRN
  Administered 2021-04-24: 2 via ORAL
  Filled 2021-04-23 (×2): qty 2

## 2021-04-23 MED ORDER — FENTANYL CITRATE (PF) 100 MCG/2ML IJ SOLN
25.0000 ug | INTRAMUSCULAR | Status: DC | PRN
  Administered 2021-04-23: 25 ug via INTRAVENOUS
  Administered 2021-04-23: 50 ug via INTRAVENOUS
  Administered 2021-04-23: 25 ug via INTRAVENOUS
  Administered 2021-04-23: 50 ug via INTRAVENOUS

## 2021-04-23 MED ORDER — ALBUTEROL SULFATE (2.5 MG/3ML) 0.083% IN NEBU
2.5000 mg | INHALATION_SOLUTION | Freq: Four times a day (QID) | RESPIRATORY_TRACT | Status: DC | PRN
  Administered 2021-04-23: 2.5 mg via RESPIRATORY_TRACT

## 2021-04-23 MED ORDER — AMISULPRIDE (ANTIEMETIC) 5 MG/2ML IV SOLN: 10.0000 mg | Freq: Once | INTRAVENOUS | Status: DC | PRN

## 2021-04-23 MED ORDER — FLUTICASONE FUROATE-VILANTEROL 200-25 MCG/ACT IN AEPB
1.0000 | INHALATION_SPRAY | Freq: Every day | RESPIRATORY_TRACT | Status: DC
  Administered 2021-04-24 – 2021-04-26 (×3): 1 via RESPIRATORY_TRACT
  Filled 2021-04-23: qty 28

## 2021-04-23 MED ORDER — ALPRAZOLAM 0.25 MG PO TABS
1.0000 mg | ORAL_TABLET | Freq: Four times a day (QID) | ORAL | Status: DC | PRN
  Administered 2021-04-24 – 2021-04-25 (×4): 1 mg via ORAL
  Filled 2021-04-23 (×5): qty 4

## 2021-04-23 MED ORDER — ALBUTEROL SULFATE (2.5 MG/3ML) 0.083% IN NEBU
INHALATION_SOLUTION | RESPIRATORY_TRACT | Status: AC
  Filled 2021-04-23: qty 3

## 2021-04-23 MED ORDER — SUGAMMADEX SODIUM 200 MG/2ML IV SOLN
INTRAVENOUS | Status: DC | PRN
  Administered 2021-04-23: 200 mg via INTRAVENOUS

## 2021-04-23 MED ORDER — SODIUM CHLORIDE 0.9 % IV SOLN: 250.0000 mL | INTRAVENOUS | Status: DC

## 2021-04-23 MED ORDER — DOCUSATE SODIUM 100 MG PO CAPS
100.0000 mg | ORAL_CAPSULE | Freq: Two times a day (BID) | ORAL | Status: DC
  Administered 2021-04-23 – 2021-04-26 (×6): 100 mg via ORAL
  Filled 2021-04-23 (×6): qty 1

## 2021-04-23 MED ORDER — PHENYLEPHRINE HCL-NACL 20-0.9 MG/250ML-% IV SOLN
INTRAVENOUS | Status: DC | PRN
  Administered 2021-04-23: 25 ug/min via INTRAVENOUS

## 2021-04-23 MED ORDER — PROPOFOL 10 MG/ML IV BOLUS
INTRAVENOUS | Status: AC
  Filled 2021-04-23: qty 20

## 2021-04-23 MED ORDER — BUPIVACAINE LIPOSOME 1.3 % IJ SUSP
INTRAMUSCULAR | Status: DC | PRN
  Administered 2021-04-23 (×2): 2.5 mL

## 2021-04-23 MED ORDER — BUPIVACAINE LIPOSOME 1.3 % IJ SUSP
INTRAMUSCULAR | Status: AC
  Filled 2021-04-23: qty 20

## 2021-04-23 MED ORDER — ASCORBIC ACID 500 MG PO TABS
1000.0000 mg | ORAL_TABLET | Freq: Every day | ORAL | Status: DC
  Administered 2021-04-23 – 2021-04-26 (×4): 1000 mg via ORAL
  Filled 2021-04-23 (×4): qty 2

## 2021-04-23 MED ORDER — DEXAMETHASONE 4 MG PO TABS
4.0000 mg | ORAL_TABLET | Freq: Four times a day (QID) | ORAL | Status: AC
  Administered 2021-04-23 – 2021-04-24 (×3): 4 mg via ORAL
  Filled 2021-04-23 (×4): qty 1

## 2021-04-23 MED ORDER — CHLORHEXIDINE GLUCONATE 0.12 % MT SOLN
OROMUCOSAL | Status: AC
  Administered 2021-04-23: 15 mL
  Filled 2021-04-23: qty 15

## 2021-04-23 MED ORDER — SODIUM CHLORIDE 0.9 % IV SOLN: INTRAVENOUS | Status: DC

## 2021-04-23 MED ORDER — ROCURONIUM BROMIDE 10 MG/ML (PF) SYRINGE
PREFILLED_SYRINGE | INTRAVENOUS | Status: DC | PRN
  Administered 2021-04-23: 20 mg via INTRAVENOUS
  Administered 2021-04-23: 100 mg via INTRAVENOUS
  Administered 2021-04-23: 30 mg via INTRAVENOUS
  Administered 2021-04-23: 20 mg via INTRAVENOUS
  Administered 2021-04-23: 70 mg via INTRAVENOUS
  Administered 2021-04-23: 30 mg via INTRAVENOUS

## 2021-04-23 MED ORDER — 0.9 % SODIUM CHLORIDE (POUR BTL) OPTIME
TOPICAL | Status: DC | PRN
  Administered 2021-04-23: 1000 mL

## 2021-04-23 MED ORDER — ACETAMINOPHEN 325 MG PO TABS: 650.0000 mg | ORAL_TABLET | ORAL | Status: DC | PRN

## 2021-04-23 MED ORDER — CELECOXIB 200 MG PO CAPS
200.0000 mg | ORAL_CAPSULE | Freq: Once | ORAL | Status: AC
  Administered 2021-04-23: 200 mg via ORAL
  Filled 2021-04-23: qty 1

## 2021-04-23 MED ORDER — DEXAMETHASONE SODIUM PHOSPHATE 10 MG/ML IJ SOLN
8.0000 mg | Freq: Four times a day (QID) | INTRAMUSCULAR | Status: AC
  Administered 2021-04-24: 8 mg via INTRAVENOUS
  Filled 2021-04-23 (×4): qty 0.8

## 2021-04-23 MED ORDER — CEFAZOLIN IN SODIUM CHLORIDE 3-0.9 GM/100ML-% IV SOLN
3.0000 g | INTRAVENOUS | Status: AC
  Administered 2021-04-23 (×2): 3 g via INTRAVENOUS
  Filled 2021-04-23: qty 100

## 2021-04-23 MED ORDER — HYDROMORPHONE HCL 1 MG/ML IJ SOLN
INTRAMUSCULAR | Status: AC
  Filled 2021-04-23: qty 0.5

## 2021-04-23 MED ORDER — HYDROMORPHONE HCL 1 MG/ML IJ SOLN
INTRAMUSCULAR | Status: AC
  Filled 2021-04-23: qty 1

## 2021-04-23 MED ORDER — METHOCARBAMOL 1000 MG/10ML IJ SOLN
500.0000 mg | Freq: Four times a day (QID) | INTRAVENOUS | Status: DC | PRN
  Filled 2021-04-23: qty 5

## 2021-04-23 MED ORDER — FLEET ENEMA 7-19 GM/118ML RE ENEM: 1.0000 | ENEMA | Freq: Once | RECTAL | Status: DC | PRN

## 2021-04-23 MED ORDER — THROMBIN 20000 UNITS EX KIT
PACK | CUTANEOUS | Status: AC
  Filled 2021-04-23: qty 1

## 2021-04-23 SURGICAL SUPPLY — 81 items
BAG COUNTER SPONGE SURGICOUNT (BAG) ×3 IMPLANT
BENZOIN TINCTURE PRP APPL 2/3 (GAUZE/BANDAGES/DRESSINGS) ×3 IMPLANT
BIT DRILL SRG 14X2.2XFLT CHK (BIT) IMPLANT
BIT DRL SRG 14X2.2XFLT CHK (BIT) ×2
BLADE AVERAGE 25X9 (BLADE) IMPLANT
BLADE CLIPPER SURG (BLADE) IMPLANT
BNDG ELASTIC 3X5.8 VLCR STR LF (GAUZE/BANDAGES/DRESSINGS) ×1 IMPLANT
BNDG ESMARK 4X9 LF (GAUZE/BANDAGES/DRESSINGS) ×1 IMPLANT
BONE VIVIGEN FORMABLE 1.3CC (Bone Implant) ×3 IMPLANT
BUR RND FLUTED 2.5 (BURR) ×1 IMPLANT
BUR SABER RD CUTTING 3.0 (BURR) IMPLANT
CLSR STERI-STRIP ANTIMIC 1/2X4 (GAUZE/BANDAGES/DRESSINGS) ×1 IMPLANT
COLLAR CERV HARD 4.25 MD TX991 (SOFTGOODS) ×1 IMPLANT
COVER SURGICAL LIGHT HANDLE (MISCELLANEOUS) ×3 IMPLANT
DERMABOND ADVANCED (GAUZE/BANDAGES/DRESSINGS) ×1
DERMABOND ADVANCED .7 DNX12 (GAUZE/BANDAGES/DRESSINGS) ×2 IMPLANT
DRAPE C-ARM 42X72 X-RAY (DRAPES) ×3 IMPLANT
DRAPE EXTREMITY T 121X128X90 (DISPOSABLE) ×1 IMPLANT
DRAPE HALF SHEET 40X57 (DRAPES) ×1 IMPLANT
DRAPE MICROSCOPE LEICA (MISCELLANEOUS) ×3 IMPLANT
DRAPE POUCH INSTRU U-SHP 10X18 (DRAPES) ×3 IMPLANT
DRAPE SURG 17X23 STRL (DRAPES) ×9 IMPLANT
DRESSING MEPILEX FLEX 4X4 (GAUZE/BANDAGES/DRESSINGS) IMPLANT
DRILL BIT SKYLINE 14MM (BIT) ×1
DRSG EMULSION OIL 3X3 NADH (GAUZE/BANDAGES/DRESSINGS) ×1 IMPLANT
DRSG MEPILEX BORDER 4X4 (GAUZE/BANDAGES/DRESSINGS) IMPLANT
DRSG MEPILEX BORDER 4X8 (GAUZE/BANDAGES/DRESSINGS) ×1 IMPLANT
DRSG MEPILEX FLEX 4X4 (GAUZE/BANDAGES/DRESSINGS) ×3
DURAPREP 6ML APPLICATOR 50/CS (WOUND CARE) ×3 IMPLANT
ELECT BLADE 4.0 EZ CLEAN MEGAD (MISCELLANEOUS)
ELECT COATED BLADE 2.86 ST (ELECTRODE) ×3 IMPLANT
ELECT REM PT RETURN 9FT ADLT (ELECTROSURGICAL) ×3
ELECTRODE BLDE 4.0 EZ CLN MEGD (MISCELLANEOUS) IMPLANT
ELECTRODE REM PT RTRN 9FT ADLT (ELECTROSURGICAL) ×2 IMPLANT
GAUZE SPONGE 4X4 12PLY STRL (GAUZE/BANDAGES/DRESSINGS) ×1 IMPLANT
GLOVE SRG 8 PF TXTR STRL LF DI (GLOVE) ×2 IMPLANT
GLOVE SURG 8.5 LATEX PF (GLOVE) ×3 IMPLANT
GLOVE SURG LTX SZ9 (GLOVE) ×3 IMPLANT
GLOVE SURG ORTHO LTX SZ7.5 (GLOVE) ×3 IMPLANT
GLOVE SURG UNDER POLY LF SZ8 (GLOVE) ×1
GOWN STRL REUS W/ TWL LRG LVL3 (GOWN DISPOSABLE) ×2 IMPLANT
GOWN STRL REUS W/TWL 2XL LVL3 (GOWN DISPOSABLE) ×6 IMPLANT
GOWN STRL REUS W/TWL LRG LVL3 (GOWN DISPOSABLE) ×1
GRAFT BNE MATRIX VG FRMBL SM 1 (Bone Implant) IMPLANT
HALTER HD/CHIN CERV TRACTION D (MISCELLANEOUS) ×3 IMPLANT
KIT BASIN OR (CUSTOM PROCEDURE TRAY) ×3 IMPLANT
KIT TURNOVER KIT B (KITS) ×3 IMPLANT
MANIFOLD NEPTUNE II (INSTRUMENTS) ×3 IMPLANT
NDL SPNL 20GX3.5 QUINCKE YW (NEEDLE) ×4 IMPLANT
NEEDLE SPNL 20GX3.5 QUINCKE YW (NEEDLE) ×6 IMPLANT
NS IRRIG 1000ML POUR BTL (IV SOLUTION) ×3 IMPLANT
PACK ORTHO CERVICAL (CUSTOM PROCEDURE TRAY) ×3 IMPLANT
PAD ARMBOARD 7.5X6 YLW CONV (MISCELLANEOUS) ×6 IMPLANT
PADDING CAST ABS 3INX4YD NS (CAST SUPPLIES) ×2
PADDING CAST ABS COTTON 3X4 (CAST SUPPLIES) IMPLANT
PATTIES SURGICAL .5 X.5 (GAUZE/BANDAGES/DRESSINGS) IMPLANT
PIN DISTRACTION 14MM (PIN) ×6 IMPLANT
PIN DISTRACTION 16MM (PIN) IMPLANT
PIN TEMP SKYLINE THREADED (PIN) ×1 IMPLANT
PLATE SKYLINE THREE LEVEL 51MM (Plate) ×1 IMPLANT
RESTRAINT LIMB HOLDER UNIV (RESTRAINTS) ×3 IMPLANT
SCREW SKYLINE VAR OS 14MM (Screw) ×1 IMPLANT
SCREW VAR SELF TAP SKYLINE 14M (Screw) ×7 IMPLANT
SPACER ACF 7MM (Bone Implant) ×1 IMPLANT
SPACER ADV ACF 9MM (Bone Implant) ×1 IMPLANT
SPACER ADV ACF LORODTIC 8MM (Bone Implant) ×1 IMPLANT
SPONGE INTESTINAL PEANUT (DISPOSABLE) ×1 IMPLANT
SPONGE SURGIFOAM ABS GEL 100 (HEMOSTASIS) ×1 IMPLANT
SPONGE T-LAP 4X18 ~~LOC~~+RFID (SPONGE) IMPLANT
STRIP CLOSURE SKIN 1/2X4 (GAUZE/BANDAGES/DRESSINGS) ×3 IMPLANT
SUT ETHILON 4 0 PS 2 18 (SUTURE) ×1 IMPLANT
SUT VIC AB 2-0 CT1 27 (SUTURE) ×2
SUT VIC AB 2-0 CT1 TAPERPNT 27 (SUTURE) ×2 IMPLANT
SUT VIC AB 3-0 X1 27 (SUTURE) ×3 IMPLANT
SUT VICRYL 4-0 PS2 18IN ABS (SUTURE) IMPLANT
SYR 20ML LL LF (SYRINGE) ×3 IMPLANT
SYR 30ML LL (SYRINGE) ×3 IMPLANT
SYSTEM CHEST DRAIN TLS 7FR (DRAIN) ×1 IMPLANT
TOWEL GREEN STERILE (TOWEL DISPOSABLE) ×3 IMPLANT
TOWEL GREEN STERILE FF (TOWEL DISPOSABLE) ×3 IMPLANT
TRAY FOLEY MTR SLVR 16FR STAT (SET/KITS/TRAYS/PACK) ×3 IMPLANT

## 2021-04-23 NOTE — Op Note (Signed)
04/23/2021  2:17 PM  PATIENT:  Megan Jimenez  56 y.o. female  MRN: [id number]  OPERATIVE REPORT  PRE-OPERATIVE DIAGNOSIS:  severe cervical spondylosis bilateral C4-5, C5-6 and C6-7.  left carpal tunnel syndrome.   POST-OPERATIVE DIAGNOSIS:  severe cervical spondylosis bilateral C4-5, C5-6 and C6-7. Left carpal tunnel syndrome.   PROCEDURE:  Procedure(s): ANTERIOR CERVICAL DISCECTOMIES AND FUSION C4-5, C5-6 AND C6-7 WITH PLATES, SCREWS, ALLOGRAFT ACDF GRAFT, LOCAL BONE GRAFT, VIVIGEN LEFT OPEN CARPAL TUNNEL RELEASE    SURGEON:  Jessy Oto, MD     ASSISTANT:  Benjiman Core, PA-C  (Present throughout the entire procedure and necessary for completion of procedure in a timely manner)     ANESTHESIA:  General, supplemented with local marcaine 0.5% 1:1 exparel total 10cc. 5cc left neck and 5 cc left volar wrist. Dr. Tobias Alexander.     COMPLICATIONS:  None.    TOURNIQUET TIME: Left arm, 10 minutes at  293mmHg   EBL: 75 CC  DRAINS: Foley to SD, 7 french TLS left anterior neck.  COMPONENTS:   Implant Name Type Inv. Item Serial No. Manufacturer Lot No. LRB No. Used Action  BONE VIVIGEN FORMABLE 1.3CC - [id number] Bone Implant BONE VIVIGEN FORMABLE 1.3CC [id number] [employer]  N/A 1 Implanted  SPACER ADV ACF 9MM - [id number] Bone Implant SPACER ADV ACF 9MM [id number] [employer]  N/A 1 Implanted  SPACER ADV ACF LORODTIC 8MM - [id number] Bone Implant SPACER ADV ACF LORODTIC 8MM [id number] [employer]  N/A 1 Implanted  SPACER ACF 7MM - [id number] Bone Implant SPACER ACF 7MM [id number] [employer]ND[employer]  N/A 1 Implanted  PLATE SKYLINE THREE LEVEL 51MM - [id number] Plate PLATE SKYLINE THREE LEVEL 51MM  [employer]  N/A 1 Implanted  SCREW VAR SELF TAP SKYLINE 72M - [id number] Screw SCREW VAR SELF TAP SKYLINE 72M  [employer]  N/A 7 Implanted  SCREW SKYLINE VAR OS 72MM - [id number] Screw SCREW SKYLINE VAR OS 72MM  [employer]  N/A 1 Implanted    PROCEDURE:The patient was met in the holding area, and the appropriate Left cervical C4-5,C5-6 and C6-7 level identified and the left volar wrist also identifiedmarked with an "X" and my initials. The patient was then transported to OR and was placed on the operative table in a supine position. The patient was then placed under  general anesthesia without difficulty. The patient received appropriate preoperative antibiotic prophylaxis.  . Nursing staff inserted a Foley catheter under sterile conditionsCervical spine was positioned with a [place] horseshoe and 5 pound cervical halter traction. All pressure points well padded and semi-beach chair position. Arm holder both arms. Standard prep with DuraPrep solution the anterior cervical spine chest. Draped in the usual manner. Iodine vi drape was used. Standard timeout protocol was carried out identifying the patient procedure side of the procedure and level. The skin the left neck was infiltrated with Marcaine 0.5% 1:1 Exparel 1.3% total of 5 cc. This at the level of expected C5-6 incision and also along the  Lines of Langer. Incision transverse at the C6 level and carried down to the level of the platysma and then medial to sternocleidomastoid muscle. The interval between the trachea and esophagus medially and the carotid sheath laterally was developed as a Metzenbaum scissors and blunt dissection exposing the anterior aspect of cervical spine at the C5-6 level Attention then turned to the C4-5,C5-6 and C6-7 levels where an 18-gauge spinal needles were placed with sheath  intact allowing only a centimeter to extend into the C5-6 and C4-5 anterior discs observed on lateral xray at the C4-5 and C5-6  level. Handheld Cloward retraction of the soft tissues while identifying the level at C4-5, C5-6 and C6-7 removing a portion of the anterior aspect of the discs with15 blade scalpel and pituitary. Medial border of the longus  collie muscles was carefully elevated bilaterally and self-retaining retractors were introduced the foot of the blade beneath the medial border of the longus colli muscles. Soft tissue overlying the anterior borders of the disc space at C5-6 and C6-7 level carefully debridement of soft tissue back to bony edges. The anterior lip osteophytes were then resected using rongeur. This bone was preserved for later bone grafting purposes.Distraction pins placed first at the C6-7 level.The C6-7 disc space was then first prepared using loupe magnification and headlight with resection of degenerative disc annulus anteriorly and posteriorly and cartilaginous endplates using micro-curettes pituitaries and a high-speed bur.Posterior lip osteophytes were drilled using a high-speed bur and a carefully resected with 1 and 2 mm Kerrison foraminotomy performed over both C7 nerve roots using 1 and 2 mm Kerrisons disc herniation material was noted centrally and into both foramen some of which represented reflected portion of the patient's annulus into the neuroforamen.Posterior aspect of the disc was excised using micro-curettes pituitary rongeur. Following decompression of the spinal cord at both C7 nerve roots irrigation was carried out. The endplates of the inferior aspect of C6 and superior aspect of C7 were carefully prepared using a high-speed bur to parallel. The disc space was then sounded utilized and a precontoured lordotic sounder for the ACDF graft.  Sounding was carried up to a 9 mm implant.  9 mm spacer was felt to provide best fit for the C6-7 disc space. The 9 mm ACDF graft implant was then brought onto the field. It was then packed with local bone graft that had been harvested previously and vivigen. The implant was then carefully placed over the intervertebral disc space at C6-7 level. Care taken to ensure that no bone or soft tissue debris within the disc space that could be retropulsed with insertion of  bone graft.  The graft was then impacted into place with the head placed in longitudinal cervical traction. The graft was in excellent position alignment. Attention then turned to the C5-6. Distraction pin at the C7 removed and placed at the C6 level. The Boss McCollough retractor replaced at the C5-6 level. The operating room microscope was draped sterilely and brought into the field the C-arm previously had been draped sterilely prior to its use. The foot of the blades medial to the longus colli muscles.  Distraction of the disc space performed.The C5-6 disc space was then first prepared [place] with resection of degenerative disc annulus anteriorly and posteriorly and cartilaginous endplates using micro-curettes pituitaries and a high-speed bur. The operating room microscope  was  used. Under the operating room microscope and posterior aspect of the disc was excised using micro-curettes pituitary rongeur and times per. Posterior lip osteophytes were drilled using a high-speed bur and a carefully resected with 1 and 2 mm Kerrison foraminotomy performed over both C6 nerve roots using 1 and 2 mm Kerrisons disc herniation material was noted centrally and into the right foramen some of which represented reflected portion of the patient's annulus into the neuroforamen. Additionally there was significant spur into the left side C6 neuroforamen affecting the C6 nerve. Following decompression of the spinal cord at both  C6 nerve roots, irrigation was carried out. Bleeding controlled using some bone wax excess bone wax removed and Gelfoam thrombin-soaked. The endplates of the inferior aspect of C5 and superior aspect of C6 were carefully prepared using a high-speed bur to parallel. The disc space was then sounded utilized and a precontoured lordotic sounder for the ACDF graft.  Sounding was carried up to a 8 mm implant.  8 mm spacer was felt to provide best fit for the C5-6 disc space. The 8 mm ACDF graft implant was then brought onto the  field. It was then packed with local bone graft that had been harvested previously. The implant was then carefully placed over the intervertebral disc space at C5-6 level. Care taken to ensure that no bone or soft tissue debris within the disc space that could be retropulsed with insertion of the bone graft. The graft was then impacted into place with the head placed in longitudinal cervical traction.  The graft was felt to be in excellent position alignment. Distraction pins at the C6 removed and replaced at the C4 level Attention then turned to the C4-5. The Boss McCollough retractor replaced at the C4-5 level. The foot of the blades medial to the longus colli muscles. With the distraction screw post placed at the C4 level and distraction of the disc space performed.The C4-5 disc space was then first prepared [place] with resection of degenerative disc annulus anteriorly and posteriorly and cartilaginous endplates using micro-curettes pituitaries and a high-speed bur. The operating room microscope  was  used. Under the operating room microscope and posterior aspect of the disc was excised using micro-curettes pituitary rongeur and times per. Posterior lip osteophytes were drilled using a high-speed bur and a carefully resected with 1 and 2 mm Kerrison foraminotomy performed over both C5 nerve roots using 1 and 2 mm Kerrisons disc herniation material was noted centrally and into the right foramen some of which represented reflected portion of the patient's annulus into the neuroforamen. Additionally there was significant spur into the right side C5 neuroforamen affecting the right C5 nerve. Following decompression of the spinal cord at both C5 nerve roots, irrigation was carried out. Bleeding controlled using some bone wax excess bone wax removed and Gelfoam thrombin-soaked. The endplates of the inferior aspect of C4 and superior aspect of C5 were carefully prepared using a high-speed bur to parallel. The disc space  was then sounded utilized and a precontoured lordotic sounder for the ACDF graft.  Sounding was carried up to a 7 mm implant.  7 mm spacer was felt to provide best fit for the C4-5 disc space. The 7 mm ACDF graft implant was then brought onto the field. It was then packed with local bone graft that had been harvested previously. The implant was then carefully placed over the intervertebral disc space at C4-5 level. Care taken to ensure that no bone or soft tissue debris within the disc space that could be retropulsed with insertion of the bone graft. The graft was then impacted into place with the head placed in longitudinal cervical traction.  The graft was felt to be in excellent position alignment. Distraction pins at the C4and C5 removed. This point irrigation was carried out cervical incision site and lower cervical incision site. Esophagus examined and the upper cervical level as well as at the lower cervical level and found to be normal. Irrigation was again carried out there was no active bleeding present.Longitudinal halter traction was discontinued. The length for cervical plate determined  with cottonoid string and two bayonet forceps. Anterior lip osteophytes were then resected about the C4-5, C5-6 and C6-7 levels using a high-speed bur. This area smoothed for application of the plate to the anterior surface of the cervical spine from C4-C7   A 51 mm plate chosen. The plate carefully applied to the anterior cervical spine and aligned. A single retaining screw placed at the left C6 level.Cervical traction was removed. Drilling 14 mm hole right side at the C7 level and a 14 mm screw placed. The retaining pin was then removed and a drill hole made on the right side at C6 to 14 mm and a 14 mm screw placed. Another 8mm screw then placed on the right side at the C5 level and on the left at the C6 and C7 levels. Then both sides at the C4 level were drill to 14 mm and 14 mm screws placed. Total of 8 x14 mm  screws were placed each locked within the plate quite nicely. Intraoperative lateral and AP c-arm fluoro of the cervical spine demonstrated plates and screws and bone grafts to be in good position alignment without any sign of canal impingement or retropulsion of graft. Irrigation was carried out at cervical incision site and lower cervical incision site. Esophagus examined and the upper cervical level as well as at the lower cervical level and found to be normal. A 7   [place] TLS drain then placed exiting on the left neck just anterior to the left sternocleidomastoid. This was sewn in place with a 4-0 Nylon suture.  The incisions were then closed by approximating the omohyoid muscle with 2-0 vicryl sutures then the deep subcutaneous layers the platysma layer sutured with interrupted 2-0 Vicryl suture and the superficial fascia overlying the sternocleidomastoid muscle with interrupted 3-0 Vicryl sutures. The subcutaneous layers were approximated with interrupted 3-0 Vicryl sutures as were the superficial layers. Skin was approximated with Dermabond. Mepilex bandage was applied. A [place] collar was then applied to the cervical spine. drapes were removed. Patient's or table was then returned to the flat position. At the end of the cervical spine surgery case all instrument and sponge counts were correct. Attention then turned to performing the left open carpal tunnel release.   The left upper extremity was then prepped using sterile conditions and draped using sterile technique.  Time-out procedure was called and correct  .Using loope magnification and head lamp a 1.5 inch incision curved at the left volar wrist crease with 15 blade scalpel.  Incision through skin and subcutaneous tissue to the volar forearm fascia and  transverse carpal ligament. Fascia then carefully lifed and incised with Stevens scissors inline with the fourth digit. The skin and subcutaneous tissue retracted and the volar fascia  divided under direct vision from distal to proximal. A freer elevator then carefully placed between the median nerve and the transverse carpal ligament protecting the  median nerve as the transverse carpal ligament was divided with a 15 blade scalpel in line with the fourth digit. Retracting the distal skin and subcutaneous tissues distally under direct visualization the remaining portions of the transverse carpal ligament were divided with tenotomy scissors again in line with the fourth digit. The palmar fascia was then divided until the traversing superficial palmar arch was identified and preserved intact.  The motor branch of the median nerve was carefully examined and identified intact. Tourniquet was then released. Total tourniquet time was 10 minutes. Bleeding controlled with bipolar electrocautery. The incision was then irrigated with  copious amounts of irrigant solution, No active bleeding was present. The incision closed with a single layer skin closure of 4-0 nylon horizontal mattress sutures. Dry dressing of adaptic, 4x4s held in place with sterile webril.  A well padded volar splint applied with ace wrap.  The patient reactivated and returned to the PACU in good condition.  All instruments and sponge counts were correct.   Benjiman Core PA-C perform the duties of assistant surgeon performing careful retraction of the esophagus and trachea during the initial exposure and careful suctioning of neural elements cervical nerve roots and cervical cord. He assisted with retraction during the left open carpal tunnel release. He was present from the beginning of case to the end of the case and assisted in positioning of the patient in transfer the patient from bed to the stretcher at the end of the case. He closed the incision on the platysma layer to the skin. Applied dressing.         [name] 04/23/2021, 2:17 PM

## 2021-04-23 NOTE — Brief Op Note (Signed)
04/23/2021  2:12 PM  PATIENT:  Megan Jimenez  56 y.o. female  PRE-OPERATIVE DIAGNOSIS:  severe cervical spondylosis bilateral C4-5, C5-6 and C6-7  POST-OPERATIVE DIAGNOSIS:  severe cervical spondylosis bilateral C4-5, C5-6 and C6-7  PROCEDURE:  Procedure(s): ANTERIOR CERVICAL DISCECTOMIES AND FUSION C4-5, C5-6 AND C6-7 WITH PLATES, SCREWS, ALLOGRAFT ACDF GRAFT, LOCAL BONE GRAFT, VIVIGEN (N/A) LEFT OPEN CARPAL TUNNEL RELEASE (Left)  SURGEON:  Surgeon(s) and Role:    * Jessy Oto, MD - Primary  PHYSICIAN ASSISTANT: Benjiman Core, PA-C   ANESTHESIA:   local and general  EBL:  75 mL   BLOOD ADMINISTERED:none  DRAINS: Urinary Catheter (Foley) and (7 [place]) TLS Drain(s) to suction in the left anterior neck.    LOCAL MEDICATIONS USED:  MARCAINE 0.5% 1:1 EXPAREL 1.3%   and Amount: 10 ml  SPECIMEN:  No Specimen  DISPOSITION OF SPECIMEN:  N/A  COUNTS:  YES  TOURNIQUET:   Total Tourniquet Time Documented: Upper Arm (Left) - 10 minutes Total: Upper Arm (Left) - 10 minutes   DICTATION: .Viviann Spare Dictation  PLAN OF CARE: Admit to inpatient   PATIENT DISPOSITION:  PACU - hemodynamically stable.   Delay start of Pharmacological VTE agent (>24hrs) due to surgical blood loss or risk of bleeding: yes

## 2021-04-23 NOTE — Anesthesia Procedure Notes (Signed)
Procedure Name: Intubation Date/Time: 04/23/2021 7:55 AM Performed by: Reeves Dam, CRNA Pre-anesthesia Checklist: Patient identified, Emergency Drugs available, Suction available and Patient being monitored Patient Re-evaluated:Patient Re-evaluated prior to induction Oxygen Delivery Method: Circle system utilized Preoxygenation: Pre-oxygenation with 100% oxygen Induction Type: IV induction Ventilation: Mask ventilation without difficulty Laryngoscope Size: Miller and 3 Grade View: Grade I Tube type: Oral Tube size: 7.5 mm Number of attempts: 1 Airway Equipment and Method: Stylet (traditional Laryngoscope used per MD request, head and neck remained in neutral position throughout induction and intubation) Placement Confirmation: ETT inserted through vocal cords under direct vision, positive ETCO2 and breath sounds checked- equal and bilateral Secured at: 22 cm Tube secured with: Tape Dental Injury: Teeth and Oropharynx as per pre-operative assessment

## 2021-04-23 NOTE — Interval H&P Note (Signed)
History and Physical Interval Note:  04/23/2021 7:38 AM  Megan Jimenez  has presented today for surgery, with the diagnosis of severe cervical spondylosis bilateral C4-5, C5-6 and C6-7.  The various methods of treatment have been discussed with the patient and family. After consideration of risks, benefits and other options for treatment, the patient has consented to  Procedure(s): ANTERIOR CERVICAL DISCECTOMIES AND FUSION C4-5, C5-6 AND C6-7 WITH PLATES, SCREWS, ALLOGRAFT ACDF GRAFT, LOCAL BONE GRAFT, VIVIGEN (N/A) LEFT OPEN CARPAL TUNNEL RELEASE (Left) as a surgical intervention.  The patient's history has been reviewed, patient examined, no change in status, stable for surgery.  I have reviewed the patient's chart and labs.  Questions were answered to the patient's satisfaction.     [name]

## 2021-04-23 NOTE — H&P (Signed)
CONTINA STRAIN is an 56 y.o. female.   Chief Complaint: neck pain and UE radiculopathy HPI: 56 year old white female with history of C4-5, C5-6 and C6-7 HNP/stenosis and left carpal tunnel syndrome comes in for preop evaluation.  Symptoms unchanged from previous visit.  She is wanting to proceed with C4-C7 ACDF and left open carpal tunnel release as scheduled.  Today history and physical performed.  Review of systems positive for some wheezing.  Patient states that she did not take her inhaler today.   Past Medical History:  Diagnosis Date   Asthma    Bipolar 1 disorder ([place])    COVID-19 03/29/2021   Family history of breast cancer    Family history of colon cancer    Family history of leukemia    Family history of lung cancer    Family history of ovarian cancer    Family history of throat cancer     Past Surgical History:  Procedure Laterality Date   ANKLE FRACTURE SURGERY Right 1992   CHOLECYSTECTOMY     COLONOSCOPY WITH PROPOFOL N/A 07/09/2020   Procedure: COLONOSCOPY WITH PROPOFOL;  Surgeon: Jonathon Bellows, MD;  Location: [facility] ENDOSCOPY;  Service: Gastroenterology;  Laterality: N/A;    Family History  Problem Relation Age of Onset   COPD Mother    Hyperlipidemia Mother    Lung cancer Mother 2   Hyperlipidemia Father    Leukemia Father 56   COPD Father    Depression Sister        ANXIETY   Healthy Sister    Breast cancer Maternal Aunt        dx 12s, recurrence 73s   Throat cancer Maternal Uncle    Lung cancer Paternal Uncle    Colon cancer Maternal Grandmother    Ovarian cancer Cousin 45       paternal   Ovarian cancer Cousin 13   Social History:  reports that she quit smoking about 17 years ago. Her smoking use included cigarettes. She has never used smokeless tobacco. She reports current alcohol use. She reports that she does not use drugs.  Allergies: No Known Allergies  Medications Prior to Admission  Medication Sig Dispense Refill   albuterol (PROVENTIL) (2.5  MG/3ML) 0.083% nebulizer solution Take 3 mLs (2.5 mg total) by nebulization every 6 (six) hours as needed for wheezing or shortness of breath. 75 mL 0   albuterol (VENTOLIN HFA) 108 (90 Base) MCG/ACT inhaler Inhale 1-2 puffs into the lungs every 4 (four) hours as needed for wheezing or shortness of breath. 18 each 6   ALPRAZolam (XANAX) 1 MG tablet Take 1 mg by mouth 4 (four) times daily as needed for anxiety.     Ascorbic Acid (VITAMIN C) 1000 MG tablet Take 1,000 mg by mouth daily.     B Complex-C (B-COMPLEX WITH VITAMIN C) tablet Take 1 tablet by mouth daily.     CAPLYTA 42 MG CAPS Take 42 mg by mouth at bedtime.     cyclobenzaprine (FLEXERIL) 5 MG tablet Take 1 tablet (5 mg total) by mouth 3 (three) times daily as needed for muscle spasms. 30 tablet 1   Eszopiclone 3 MG TABS TAKE 1 TABLET (3 MG TOTAL) BY MOUTH AT BEDTIME. TAKE IMMEDIATELY BEFORE BEDTIME 90 tablet 0   ferrous sulfate 325 (65 FE) MG tablet Take 325 mg by mouth daily with breakfast.     fluticasone (FLONASE) 50 MCG/ACT nasal spray Place 2 sprays into both nostrils daily. 16 g 2  fluticasone furoate-vilanterol (BREO ELLIPTA) 200-25 MCG/ACT AEPB Inhale 1 puff into the lungs daily. 60 each 5   gabapentin (NEURONTIN) 300 MG capsule TAKE 1 CAPSULE BY MOUTH AT BEDTIME FOR 7 DAYS, THEN 1 CAPSULE 2 TIMES DAILY FOR 22 DAYS. (Patient taking differently: Take 300 mg by mouth 2 (two) times daily.) 51 capsule 0   loratadine (CLARITIN) 10 MG tablet Take 10 mg by mouth daily.     magnesium gluconate (MAGONATE) 500 MG tablet Take 500 mg by mouth daily.     meloxicam (MOBIC) 15 MG tablet TAKE 1 TABLET (15 MG TOTAL) BY MOUTH DAILY. 30 tablet 2   montelukast (SINGULAIR) 10 MG tablet Take 1 tablet (10 mg total) by mouth at bedtime. 90 tablet 3   Multiple Vitamin (MULTIVITAMIN WITH MINERALS) TABS tablet Take 1 tablet by mouth daily.     vitamin B-12 (CYANOCOBALAMIN) 500 MCG tablet Take 500 mcg by mouth daily.     Vitamin D, Ergocalciferol, (DRISDOL)  50000 units CAPS capsule Take 50,000 Units by mouth every Friday.   5   doxycycline (VIBRA-TABS) 100 MG tablet Take 1 tablet (100 mg total) by mouth 2 (two) times daily. 20 tablet 0   HYDROcodone-acetaminophen (NORCO/VICODIN) 5-325 MG tablet Take 1 tablet by mouth every 6 (six) hours as needed for moderate pain. 30 tablet 0   predniSONE (STERAPRED UNI-PAK 21 TAB) 10 MG (21) TBPK tablet Use as directed 21 tablet 0    No results found for this or any previous visit (from the past 48 hour(s)). No results found.  Review of Systems  Constitutional:  Positive for activity change.  HENT:  Positive for congestion.   Respiratory:  Positive for cough.   Cardiovascular: Negative.   Gastrointestinal: Negative.   Genitourinary: Negative.   Musculoskeletal:  Positive for neck pain.  Neurological:  Positive for numbness.   Height 5\' 9"  (1.753 m), weight 123.8 kg, last menstrual period 09/28/2018. Physical Exam HENT:     Head: Normocephalic and atraumatic.     Nose: Nose normal.  Cardiovascular:     Rate and Rhythm: Regular rhythm.     Heart sounds: Normal heart sounds.  Pulmonary:     Breath sounds: Wheezing present.  Musculoskeletal:        General: Tenderness present.  Neurological:     Mental Status: She is alert and oriented to person, place, and time.     Assessment/Plan C4-C7 HNP/stenosis  .Surgical procedure discussed along with potential recovery time.  Also discussed potential risk of pseudoarthrosis with multilevel fusion.  All questions answered.  Benjiman Core, PA-C 04/23/2021, 5:51 AM

## 2021-04-23 NOTE — Discharge Instructions (Addendum)
° ° °  Keep dressing dry. Elevated left wrist above heart. Apply ice to palm side of left wrist two hours on and one half hour off for 48 hours. May Apply ice at night and go to sleep with out changing. Be sure to keep ice off fingers to prevent frost bite.  Return to office in two weeks for removal of sutures left wrist.   No lifting greater than 10 lbs. No overhead use of arms. Avoid bending,and twisting neck. Walk in house for first week them may start to get out slowly increasing distance up to one half mile by 3 weeks post op. Keep incision dry for 3 days, may then bathe and wet incision using a [place] collar when showering. Call if any fevers >101, chills, or increasing numbness or weakness or increased swelling or drainage.

## 2021-04-23 NOTE — Transfer of Care (Signed)
Immediate Anesthesia Transfer of Care Note  Patient: [name]  Procedure(s) Performed: ANTERIOR CERVICAL DISCECTOMIES AND FUSION C4-5, C5-6 AND C6-7 WITH PLATES, SCREWS, ALLOGRAFT ACDF GRAFT, LOCAL BONE GRAFT, VIVIGEN LEFT OPEN CARPAL TUNNEL RELEASE (Left)  Patient Location: PACU  Anesthesia Type:General  Level of Consciousness: awake, alert , oriented and patient cooperative  Airway & Oxygen Therapy: Patient Spontanous Breathing and Patient connected to nasal cannula oxygen  Post-op Assessment: Report given to RN, Post -op Vital signs reviewed and stable and Patient moving all extremities X 4  Post vital signs: Reviewed and stable  Last Vitals:  Vitals Value Taken Time  BP 121/50 04/23/21 1350  Temp 36.7 C 04/23/21 1350  Pulse 101 04/23/21 1359  Resp 19 04/23/21 1359  SpO2 95 % 04/23/21 1359  Vitals shown include unvalidated device data.  Last Pain:  Vitals:   04/23/21 1350  TempSrc:   PainSc: 8       Patients Stated Pain Goal: 0 (45/80/99 8338)  Complications: No notable events documented.

## 2021-04-24 LAB — CBC
HCT: 34.4 % — ABNORMAL LOW (ref 36.0–46.0)
Hemoglobin: 10.9 g/dL — ABNORMAL LOW (ref 12.0–15.0)
MCH: 30.6 pg (ref 26.0–34.0)
MCHC: 31.7 g/dL (ref 30.0–36.0)
MCV: 96.6 fL (ref 80.0–100.0)
Platelets: 158 10*3/uL (ref 150–400)
RBC: 3.56 MIL/uL — ABNORMAL LOW (ref 3.87–5.11)
RDW: 13.2 % (ref 11.5–15.5)
WBC: 9.6 10*3/uL (ref 4.0–10.5)
nRBC: 0 % (ref 0.0–0.2)

## 2021-04-24 LAB — BASIC METABOLIC PANEL
Anion gap: 11 (ref 5–15)
BUN: 10 mg/dL (ref 6–20)
CO2: 23 mmol/L (ref 22–32)
Calcium: 8.7 mg/dL — ABNORMAL LOW (ref 8.9–10.3)
Chloride: 103 mmol/L (ref 98–111)
Creatinine, Ser: 0.58 mg/dL (ref 0.44–1.00)
GFR, Estimated: >60 mL/min (ref >60)
Glucose, Bld: 123 mg/dL — ABNORMAL HIGH (ref 70–99)
Potassium: 4.3 mmol/L (ref 3.5–5.1)
Sodium: 137 mmol/L (ref 135–145)

## 2021-04-24 MED ORDER — OXYCODONE HCL ER 10 MG PO T12A
20.0000 mg | EXTENDED_RELEASE_TABLET | Freq: Two times a day (BID) | ORAL | Status: DC
  Administered 2021-04-24 – 2021-04-26 (×4): 20 mg via ORAL
  Filled 2021-04-24 (×4): qty 2

## 2021-04-24 MED ORDER — PANTOPRAZOLE SODIUM 40 MG PO TBEC
40.0000 mg | DELAYED_RELEASE_TABLET | Freq: Every day | ORAL | Status: DC
  Administered 2021-04-24 – 2021-04-25 (×2): 40 mg via ORAL
  Filled 2021-04-24 (×2): qty 1

## 2021-04-24 MED FILL — Thrombin For Soln 20000 Unit: CUTANEOUS | Qty: 1 | Status: AC

## 2021-04-24 NOTE — Progress Notes (Signed)
TLS drain to left neck disconnected from site, pt not aware how incident occurred, no new drainage noted on from site/dressing. MD paged awaiting reply. Pt remain alert and verbally responsive.

## 2021-04-24 NOTE — Anesthesia Postprocedure Evaluation (Signed)
Anesthesia Post Note  Patient: Megan Jimenez  Procedure(s) Performed: ANTERIOR CERVICAL DISCECTOMIES AND FUSION C4-5, C5-6 AND C6-7 WITH PLATES, SCREWS, ALLOGRAFT ACDF GRAFT, LOCAL BONE GRAFT, VIVIGEN LEFT OPEN CARPAL TUNNEL RELEASE (Left)     Patient location during evaluation: PACU Anesthesia Type: General Level of consciousness: sedated Pain management: pain level controlled Vital Signs Assessment: post-procedure vital signs reviewed and stable Respiratory status: spontaneous breathing and respiratory function stable Cardiovascular status: stable Postop Assessment: no apparent nausea or vomiting Anesthetic complications: no   No notable events documented.               Ketrina Boateng DANIEL

## 2021-04-24 NOTE — Progress Notes (Addendum)
° ° ° °  Subjective: 1 Day Post-Op Procedure(s) (LRB): ANTERIOR CERVICAL DISCECTOMIES AND FUSION C4-5, C5-6 AND C6-7 WITH PLATES, SCREWS, ALLOGRAFT ACDF GRAFT, LOCAL BONE GRAFT, VIVIGEN (N/A) LEFT OPEN CARPAL TUNNEL RELEASE (Left) Awake, alert and oriented x 4. Drain came out this AM but drained about 3.5 test tubes full overnight. Voiding post foley removal. OOB to bedside recliner.  Advised to elevate HOB to decrease tendency to swell.  Patient reports pain as marked.    Objective:   VITALS:  Temp:  [98.1 F (36.7 C)-98.6 F (37 C)] 98.6 F (37 C) (02/08 1152) Pulse Rate:  [76-100] 87 (02/08 1152) Resp:  [10-20] 18 (02/08 1152) BP: (107-164)/(50-90) 164/80 (02/08 1152) SpO2:  [93 %-100 %] 98 % (02/08 1152) Weight:  [124.8 kg] 124.8 kg (02/07 1632)  Neurologically intact ABD soft Neurovascular intact Sensation intact distally Intact pulses distally Dorsiflexion/Plantar flexion intact Incision: no drainage, scant drainage, and single suture used to hold drain is removed. TLS placed in sharps container. Splint left wrist intact, fingers with normal capillary refill, warm  LABS Recent Labs    04/24/21 0645  HGB 10.9*  WBC 9.6  PLT 158   Recent Labs    04/24/21 0645  NA 137  K 4.3  CL 103  CO2 23  BUN 10  CREATININE 0.58  GLUCOSE 123*   No results for input(s): LABPT, INR in the last 72 hours.   Assessment/Plan: 1 Day Post-Op Procedure(s) (LRB): ANTERIOR CERVICAL DISCECTOMIES AND FUSION C4-5, C5-6 AND C6-7 WITH PLATES, SCREWS, ALLOGRAFT ACDF GRAFT, LOCAL BONE GRAFT, VIVIGEN (N/A) LEFT OPEN CARPAL TUNNEL RELEASE (Left)  Advance diet Up with therapy D/C IV fluids Receiving IVFs and steroids.   [name] 04/24/2021, 1:10 PM Patient ID: Megan Jimenez, female   DOB: 1965-09-16, 56 y.o.   MRN: [id number]

## 2021-04-24 NOTE — Progress Notes (Signed)
Orthopedic Tech Progress Note Patient Details:  Megan Jimenez 1965/04/10 [id number] Aspen Collar was delivered to patient's room for nurse to apply.  Patient ID: [name], female   DOB: 1965-05-31, 56 y.o.   MRN: [id number]  Jearld Lesch 04/24/2021, 5:50 PM

## 2021-04-24 NOTE — Progress Notes (Addendum)
Paged on call at [facility] about change in patients status, re pt removed drain.

## 2021-04-24 NOTE — Evaluation (Signed)
Occupational Therapy Evaluation Patient Details Name: Megan Jimenez MRN: [id number] DOB: October 05, 1965 Today's Date: 04/24/2021   History of Present Illness 56 y.o. female presents to [facility] hospital on 04/23/2021 for elective C4-7 ACDF. PMH includes asthma, bipolar disorder, COVID-19.   Clinical Impression   Patient sitting up on the recliner, primary deficit is post op discomfort to neck and L hand.  All education completed, she is moving around her room well.  Tendon gliding sheet and HEP reviewed with goo understanding.  No further OT needs in the acute setting.  No post acute OT anticipated.       Recommendations for follow up therapy are one component of a multi-disciplinary discharge planning process, led by the attending physician.  Recommendations may be updated based on patient status, additional functional criteria and insurance authorization.   Follow Up Recommendations  Follow physician's recommendations for discharge plan and follow up therapies    Assistance Recommended at Discharge PRN  Patient can return home with the following      Functional Status Assessment  Patient has not had a recent decline in their functional status  Equipment Recommendations  None recommended by OT    Recommendations for Other Services       Precautions / Restrictions Precautions Precautions: Cervical Precaution Booklet Issued: Yes (comment) Precaution Comments: Recommended minimize pulling from L hand. Required Braces or Orthoses: Cervical Brace Cervical Brace: Hard collar Restrictions Weight Bearing Restrictions: No      Mobility Bed Mobility               General bed mobility comments: Up in recliner    Transfers Overall transfer level: Modified independent                 General transfer comment: In room mobility/toileting      Balance Overall balance assessment: Mild deficits observed, not formally tested                                          ADL either performed or assessed with clinical judgement   ADL                                         General ADL Comments: generalized supervision     Vision Baseline Vision/History: 1 Wears glasses Patient Visual Report: No change from baseline       Perception Perception Perception: Not tested   Praxis Praxis Praxis: Not tested    Pertinent Vitals/Pain Pain Assessment Pain Assessment: Faces Faces Pain Scale: Hurts even more Pain Location: neck and LUE Pain Descriptors / Indicators: Tender, Tightness, Pressure Pain Intervention(s): Monitored during session     Hand Dominance Right   Extremity/Trunk Assessment Upper Extremity Assessment Upper Extremity Assessment: LUE deficits/detail LUE Deficits / Details: pt reports LUE burning and tingling, ROM WFL (splinted at wrist) LUE: Unable to fully assess due to immobilization LUE Sensation: WNL LUE Coordination: WNL   Lower Extremity Assessment Lower Extremity Assessment: Defer to PT evaluation RLE Sensation: decreased light touch LLE Sensation: decreased light touch   Cervical / Trunk Assessment Cervical / Trunk Assessment: Neck Surgery   Communication Communication Communication: No difficulties   Cognition Arousal/Alertness: Awake/alert Behavior During Therapy: WFL for tasks assessed/performed Overall Cognitive Status: Within Functional Limits for tasks assessed  General Comments  VSS on RA    Exercises     Shoulder Instructions      Home Living Family/patient expects to be discharged to:: Private residence Living Arrangements: Spouse/significant other Available Help at Discharge: Family;Available 24 hours/day Type of Home: House Home Access: Stairs to enter [employer] of Steps: 4 Entrance Stairs-Rails: Can reach both Home Layout: One level     Bathroom Shower/Tub: Teacher, early years/pre:  Standard     Home Equipment: None          Prior Functioning/Environment Prior Level of Function : Independent/Modified Independent;Working/employed             Mobility Comments: works from home          OT Problem List: Pain      OT Treatment/Interventions:      OT Goals(Current goals can be found in the care plan section) Acute Rehab OT Goals Patient Stated Goal: Hoping to go home tomorrow OT Goal Formulation: With patient Time For Goal Achievement: 04/29/21 Potential to Achieve Goals: Good  OT Frequency:      Co-evaluation              AM-PAC OT "6 Clicks" Daily Activity     Outcome Measure Help from another person eating meals?: None Help from another person taking care of personal grooming?: None Help from another person toileting, which includes using toliet, bedpan, or urinal?: None Help from another person bathing (including washing, rinsing, drying)?: None Help from another person to put on and taking off regular upper body clothing?: None Help from another person to put on and taking off regular lower body clothing?: None 6 Click Score: 24   End of Session    Activity Tolerance: Patient tolerated treatment well Patient left: in chair;with call bell/phone within reach  OT Visit Diagnosis: Pain Pain - Right/Left: Left Pain - part of body: Hand                Time: 7062-3762 OT Time Calculation (min): 20 min Charges:  OT General Charges $OT Visit: 1 Visit OT Evaluation $OT Eval Moderate Complexity: 1 Mod  04/24/2021  RP, OTR/L  Acute Rehabilitation Services  Office:  [phone]   Metta Clines 04/24/2021, 1:49 PM

## 2021-04-24 NOTE — Evaluation (Signed)
Physical Therapy Evaluation Patient Details Name: Megan Jimenez MRN: [id number] DOB: Oct 12, 1965 Today's Date: 04/24/2021  History of Present Illness  56 y.o. female presents to [facility] hospital on 04/23/2021 for elective C4-7 ACDF. PMH includes asthma, bipolar disorder, COVID-19.  Clinical Impression  Pt presents to PT with deficits in sensation, strength, power, endurance, gait, balance. Pt with residual sensation deficits in BLE and LUE, reporting resolved RUE sensation deficits. Pt with LUE and BLE weakness, requiring use of railing to power up stairs. Pt will benefit from continued acute PT services to aide in improving strength and restoring the patient's prior level of function.       Recommendations for follow up therapy are one component of a multi-disciplinary discharge planning process, led by the attending physician.  Recommendations may be updated based on patient status, additional functional criteria and insurance authorization.  Follow Up Recommendations No PT follow up (may benefit from outpatient PT once cleared by surgeon)    Assistance Recommended at Discharge PRN  Patient can return home with the following  A little help with walking and/or transfers;Help with stairs or ramp for entrance;A little help with bathing/dressing/bathroom    Equipment Recommendations None recommended by PT  Recommendations for Other Services       Functional Status Assessment Patient has had a recent decline in their functional status and demonstrates the ability to make significant improvements in function in a reasonable and predictable amount of time.     Precautions / Restrictions Precautions Precautions: Back;Fall Precaution Booklet Issued: Yes (comment) Required Braces or Orthoses: Cervical Brace Cervical Brace: Hard collar (on when out of bed, may apply in sitting, may remove to shower) Restrictions Weight Bearing Restrictions: No      Mobility  Bed Mobility Overal bed mobility:  Needs Assistance Bed Mobility: Rolling, Sidelying to Sit, Sit to Sidelying Rolling: Modified independent (Device/Increase time) Sidelying to sit: Supervision     Sit to sidelying: Supervision      Transfers Overall transfer level: Needs assistance Equipment used: None Transfers: Sit to/from Stand Sit to Stand: Supervision                Ambulation/Gait Ambulation/Gait assistance: Supervision Gait Distance (Feet): 200 Feet Assistive device: None Gait Pattern/deviations: Step-through pattern Gait velocity: functional Gait velocity interpretation: >2.62 ft/sec, indicative of community ambulatory   General Gait Details: pt with steady step-through gait  Stairs Stairs: Yes Stairs assistance: Supervision Stair Management: One rail Right, Step to pattern Number of Stairs: 10    Wheelchair Mobility    Modified Rankin (Stroke Patients Only)       Balance Overall balance assessment: Needs assistance Sitting-balance support: No upper extremity supported, Feet supported Sitting balance-Leahy Scale: Good     Standing balance support: No upper extremity supported, During functional activity Standing balance-Leahy Scale: Good                               Pertinent Vitals/Pain Pain Assessment Pain Assessment: 0-10 Pain Score: 6  Pain Location: neck and LUE Pain Descriptors / Indicators: Burning Pain Intervention(s): Monitored during session    Home Living Family/patient expects to be discharged to:: Private residence Living Arrangements: Spouse/significant other Available Help at Discharge: Family;Available 24 hours/day Type of Home: House Home Access: Stairs to enter Entrance Stairs-Rails: Can reach both Entrance Stairs-Number of Steps: 4   Home Layout: One level Home Equipment: None      Prior Function Prior Level of  Function : Independent/Modified Independent;Working/employed             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        Extremity/Trunk Assessment   Upper Extremity Assessment Upper Extremity Assessment: LUE deficits/detail LUE Deficits / Details: pt reports LUE burning and tingling, ROM WFL (splinted at wrist)    Lower Extremity Assessment Lower Extremity Assessment: Generalized weakness;RLE deficits/detail;LLE deficits/detail RLE Sensation: decreased light touch LLE Sensation: decreased light touch    Cervical / Trunk Assessment Cervical / Trunk Assessment: Neck Surgery  Communication   Communication: No difficulties  Cognition Arousal/Alertness: Awake/alert Behavior During Therapy: WFL for tasks assessed/performed Overall Cognitive Status: Within Functional Limits for tasks assessed                                          General Comments General comments (skin integrity, edema, etc.): VSS on RA    Exercises     Assessment/Plan    PT Assessment Patient needs continued PT services  PT Problem List Decreased strength;Decreased activity tolerance;Decreased balance;Decreased mobility;Impaired sensation;Decreased knowledge of precautions;Pain       PT Treatment Interventions DME instruction;Gait training;Stair training;Functional mobility training;Therapeutic activities;Therapeutic exercise;Balance training;Neuromuscular re-education;Patient/family education    PT Goals (Current goals can be found in the Care Plan section)  Acute Rehab PT Goals Patient Stated Goal: to return home PT Goal Formulation: With patient Time For Goal Achievement: 05/08/21 Potential to Achieve Goals: Good    Frequency Min 5X/week     Co-evaluation               AM-PAC PT "6 Clicks" Mobility  Outcome Measure Help needed turning from your back to your side while in a flat bed without using bedrails?: None Help needed moving from lying on your back to sitting on the side of a flat bed without using bedrails?: A Little Help needed moving to and from a bed  to a chair (including a wheelchair)?: A Little Help needed standing up from a chair using your arms (e.g., wheelchair or bedside chair)?: A Little Help needed to walk in hospital room?: A Little Help needed climbing 3-5 steps with a railing? : A Little 6 Click Score: 19    End of Session Equipment Utilized During Treatment: Cervical collar Activity Tolerance: Patient tolerated treatment well Patient left: in bed;with call bell/phone within reach Nurse Communication: Mobility status PT Visit Diagnosis: Other abnormalities of gait and mobility (R26.89);Other symptoms and signs involving the nervous system (R29.898)    Time: 7829-5621 PT Time Calculation (min) (ACUTE ONLY): 24 min   Charges:   PT Evaluation $PT Eval Low Complexity: [place], PT, DPT Acute Rehabilitation Pager: [phone] Office [phone]   Zenaida Niece 04/24/2021, 11:49 AM

## 2021-04-25 ENCOUNTER — Inpatient Hospital Stay (HOSPITAL_COMMUNITY): Payer: 59

## 2021-04-25 MED ORDER — HYDROXYZINE HCL 25 MG PO TABS: 25.0000 mg | ORAL_TABLET | Freq: Four times a day (QID) | ORAL | Status: DC | PRN

## 2021-04-25 MED ORDER — HYDROXYZINE HCL 25 MG PO TABS
25.0000 mg | ORAL_TABLET | Freq: Four times a day (QID) | ORAL | Status: AC
  Administered 2021-04-25: 25 mg via ORAL
  Filled 2021-04-25: qty 1

## 2021-04-25 NOTE — Progress Notes (Signed)
° ° ° °  Subjective: 2 Days Post-Op Procedure(s) (LRB): ANTERIOR CERVICAL DISCECTOMIES AND FUSION C4-5, C5-6 AND C6-7 WITH PLATES, SCREWS, ALLOGRAFT ACDF GRAFT, LOCAL BONE GRAFT, VIVIGEN (N/A) LEFT OPEN CARPAL TUNNEL RELEASE (Left) Slept some this AM but awoke with feelings of neck swelling. Sore throat. Voiding well, no BM yet. Walking to bathroom. "Will I be able to go home today?" Aspen collar on bed and patient is out of bed to bedside recliner. Given collar and advised to use as there is A 30% chance of developing a nonhealing fusion level, she understands. Will ask for a swallow assessment with speech therapy  Patient reports pain as moderate.    Objective:   VITALS:  Temp:  [97.7 F (36.5 C)-98.7 F (37.1 C)] 98 F (36.7 C) (02/09 0809) Pulse Rate:  [72-87] 73 (02/09 0809) Resp:  [18-19] 18 (02/09 0809) BP: (122-173)/(74-84) 139/82 (02/09 0809) SpO2:  [94 %-98 %] 97 % (02/09 0809)  Neurologically intact ABD soft Neurovascular intact Sensation intact distally Intact pulses distally Dorsiflexion/Plantar flexion intact Compartment soft Dressing is dry, minimal neck swelling  LABS Recent Labs    04/24/21 0645  HGB 10.9*  WBC 9.6  PLT 158   Recent Labs    04/24/21 0645  NA 137  K 4.3  CL 103  CO2 23  BUN 10  CREATININE 0.58  GLUCOSE 123*   No results for input(s): LABPT, INR in the last 72 hours.   Assessment/Plan: 2 Days Post-Op Procedure(s) (LRB): ANTERIOR CERVICAL DISCECTOMIES AND FUSION C4-5, C5-6 AND C6-7 WITH PLATES, SCREWS, ALLOGRAFT ACDF GRAFT, LOCAL BONE GRAFT, VIVIGEN (N/A) LEFT OPEN CARPAL TUNNEL RELEASE (Left)  Advance diet Up with therapy D/C IV fluids Will have speech assess swallowing due to her concerns. Has a recliner she can use at home to allow head and neck elevation during the acute phase of post op care post 3 level fusion.  [name] 04/25/2021, 8:39 AM Patient ID: Megan Jimenez, female   DOB: 09/13/1965, 56 y.o.   MRN:  [id number]

## 2021-04-25 NOTE — Progress Notes (Signed)
Patient ID: Megan Jimenez, female   DOB: 09/03/1965, 56 y.o.   MRN: [id number] She underwent Speech therapy evaluation and is having dysphagia appropriate for surgical intervention.  Expected improvement with time. Check in on her in the AM and expect she may be ready for discharge at that time.

## 2021-04-25 NOTE — Evaluation (Signed)
Clinical/Bedside Swallow Evaluation Patient Details  Name: Megan Jimenez MRN: [id number] Date of Birth: 02/14/1966  Today's Date: 04/25/2021 Time: SLP Start Time (ACUTE ONLY): 28 SLP Stop Time (ACUTE ONLY): 1500 SLP Time Calculation (min) (ACUTE ONLY): 25 min  Past Medical History:  Past Medical History:  Diagnosis Date   Asthma    Bipolar 1 disorder ([place])    COVID-19 03/29/2021   Family history of breast cancer    Family history of colon cancer    Family history of leukemia    Family history of lung cancer    Family history of ovarian cancer    Family history of throat cancer    Past Surgical History:  Past Surgical History:  Procedure Laterality Date   ANKLE FRACTURE SURGERY Right 1992   CHOLECYSTECTOMY     COLONOSCOPY WITH PROPOFOL N/A 07/09/2020   Procedure: COLONOSCOPY WITH PROPOFOL;  Surgeon: Jonathon Bellows, MD;  Location: [facility] ENDOSCOPY;  Service: Gastroenterology;  Laterality: N/A;   HPI:  56 y.o. female presents to [facility] hospital on 04/23/2021 for elective C4-7 ACDF. PMH includes asthma, bipolar disorder, COVID-19.    Assessment / Plan / Recommendation  Clinical Impression   Pt primarily has c/o odynophagia that she says is not exacerbated or alleviated by trying different consistencies. She believes that she needs a "warm up" swallow and that she has to be extra careful, but that she is able to get all her food and drink down using some compensatory strategies. Brief oral holding was noted during initial sips of thin liquids, suspect as a volitional strategy, but otherwise her swallowing appeared to be generally functional. There are no overt s/s of aspiration. SLP provided education on aspiration precautions and ways to soften or moisten foods to facilitate odynophagia as much as possible, acknowledging that this is something that is not unexpected post-operatively and that it should continue to improve with time. Pt voices her understanding and says that she believes she is  swallowing okay outside of her pain. Will f/u briefly if she remains inpatient, but do not anticipate she will have ongoing SLP needs post-discharge. SLP did encourage her to continue to monitor for any signs of dysphagia and/or aspiration though.   SLP Visit Diagnosis: Dysphagia, unspecified (R13.10)    Aspiration Risk  Mild aspiration risk    Diet Recommendation Dysphagia 3 (Mech soft);Thin liquid (advance as tolerated)   Liquid Administration via: Cup;Straw Medication Administration: Whole meds with liquid Supervision: Patient able to self feed Compensations: Slow rate;Small sips/bites Postural Changes: Seated upright at 90 degrees    Other  Recommendations Oral Care Recommendations: Oral care BID    Recommendations for follow up therapy are one component of a multi-disciplinary discharge planning process, led by the attending physician.  Recommendations may be updated based on patient status, additional functional criteria and insurance authorization.  Follow up Recommendations No SLP follow up (none anticipated at this time)      Assistance Recommended at Discharge PRN  Functional Status Assessment Patient has had a recent decline in their functional status and demonstrates the ability to make significant improvements in function in a reasonable and predictable amount of time.  Frequency and Duration min 2x/week  1 week       Prognosis Prognosis for Safe Diet Advancement: Good      Swallow Study   General HPI: 56 y.o. female presents to [facility] hospital on 04/23/2021 for elective C4-7 ACDF. PMH includes asthma, bipolar disorder, COVID-19. Type of Study: Bedside Swallow Evaluation Previous Swallow  Assessment: none in chart Diet Prior to this Study: Dysphagia 3 (soft);Thin liquids Temperature Spikes Noted: No Respiratory Status: Room air History of Recent Intubation:  (for procedure) Behavior/Cognition: Alert;Cooperative Oral Cavity Assessment: Within Functional Limits Oral  Care Completed by SLP: No Oral Cavity - Dentition: Adequate natural dentition Vision: Functional for self-feeding Self-Feeding Abilities: Able to feed self Patient Positioning: Upright in bed Baseline Vocal Quality: Hoarse (mild) Volitional Cough: Strong (but guarded by pain) Volitional Swallow: Able to elicit    Oral/Motor/Sensory Function Overall Oral Motor/Sensory Function: Within functional limits   Ice Chips Ice chips: Not tested   Thin Liquid Thin Liquid: Impaired Presentation: Self Fed;Straw Oral Phase Functional Implications: Oral holding    Nectar Thick Nectar Thick Liquid: Not tested   Honey Thick Honey Thick Liquid: Not tested   Puree Puree: Within functional limits Presentation: Spoon;Self Fed   Solid     Solid: Within functional limits Presentation: Self Fed      Osie Bond., M.A. [facility] Pager [phone] Office [phone]  04/25/2021,3:04 PM

## 2021-04-25 NOTE — Progress Notes (Signed)
Physical Therapy Treatment Patient Details Name: Megan Jimenez MRN: [id number] DOB: 03-03-1966 Today's Date: 04/25/2021   History of Present Illness 56 y.o. female presents to [facility] hospital on 04/23/2021 for elective C4-7 ACDF. PMH includes asthma, bipolar disorder, COVID-19.    PT Comments    Pt tolerates treatment well demonstrating improved gait speed and balance. Pt expresses good knowledge of neck precautions and denies concerns about managing bed mobility with log roll technique. Pt reports no further mobility concerns at this time. Acute PT signing off.  Recommendations for follow up therapy are one component of a multi-disciplinary discharge planning process, led by the attending physician.  Recommendations may be updated based on patient status, additional functional criteria and insurance authorization.  Follow Up Recommendations  No PT follow up     Assistance Recommended at Discharge PRN  Patient can return home with the following A little help with bathing/dressing/bathroom;Assistance with cooking/housework   Equipment Recommendations  None recommended by PT    Recommendations for Other Services       Precautions / Restrictions Precautions Precautions: Cervical Precaution Booklet Issued: Yes (comment) Required Braces or Orthoses: Cervical Brace Cervical Brace: Hard collar Restrictions Weight Bearing Restrictions: No     Mobility  Bed Mobility                    Transfers Overall transfer level: Independent                      Ambulation/Gait Ambulation/Gait assistance: Independent Gait Distance (Feet): 500 Feet Assistive device: None Gait Pattern/deviations: WFL(Within Functional Limits) Gait velocity: functional Gait velocity interpretation: >2.62 ft/sec, indicative of community ambulatory   General Gait Details: steady step-through gait   Stairs Stairs: Yes Stairs assistance: Modified independent (Device/Increase time) Stair  Management: One rail Right, Step to pattern, Forwards (pt requires supervision when attempting to alternate stairs, otherwise modI) Number of Stairs: 10     Wheelchair Mobility    Modified Rankin (Stroke Patients Only)       Balance Overall balance assessment: Independent                                          Cognition Arousal/Alertness: Awake/alert Behavior During Therapy: WFL for tasks assessed/performed Overall Cognitive Status: Within Functional Limits for tasks assessed                                          Exercises      General Comments General comments (skin integrity, edema, etc.): VSS on RA      Pertinent Vitals/Pain Pain Assessment Pain Assessment: Faces Faces Pain Scale: Hurts little more Pain Location: neck Pain Descriptors / Indicators: Grimacing Pain Intervention(s): Monitored during session    Home Living Family/patient expects to be discharged to:: Private residence Living Arrangements: Spouse/significant other Available Help at Discharge: Family;Available 24 hours/day Type of Home: House Home Access: Stairs to enter Entrance Stairs-Rails: Can reach both Entrance Stairs-Number of Steps: 4            Prior Function            PT Goals (current goals can now be found in the care plan section) Progress towards PT goals: Goals met/education completed, patient discharged from PT    Frequency  Min 5X/week      PT Plan Current plan remains appropriate    Co-evaluation              AM-PAC PT "6 Clicks" Mobility   Outcome Measure  Help needed turning from your back to your side while in a flat bed without using bedrails?: None Help needed moving from lying on your back to sitting on the side of a flat bed without using bedrails?: None Help needed moving to and from a bed to a chair (including a wheelchair)?: None Help needed standing up from a chair using your arms (e.g., wheelchair or  bedside chair)?: None Help needed to walk in hospital room?: None Help needed climbing 3-5 steps with a railing? : None 6 Click Score: 24    End of Session Equipment Utilized During Treatment: Cervical collar Activity Tolerance: Patient tolerated treatment well Patient left: in chair;with call bell/phone within reach Nurse Communication: Mobility status PT Visit Diagnosis: Other abnormalities of gait and mobility (R26.89);Other symptoms and signs involving the nervous system (R29.898)     Time: 3009-2330 PT Time Calculation (min) (ACUTE ONLY): 10 min  Charges:  $Gait Training: 8-22 mins                     Zenaida Niece, PT, DPT Acute Rehabilitation Pager: [phone] Office [place] Shizuo Biskup 04/25/2021, 11:07 AM

## 2021-04-26 ENCOUNTER — Telehealth: Payer: Self-pay | Admitting: Specialist

## 2021-04-26 MED ORDER — PHENOL 1.4 % MT LIQD: 1.0000 | OROMUCOSAL | 1 refills | Status: DC | PRN

## 2021-04-26 MED ORDER — HYDROXYZINE HCL 25 MG PO TABS: 25.0000 mg | ORAL_TABLET | Freq: Four times a day (QID) | ORAL | 0 refills | Status: DC | PRN

## 2021-04-26 MED ORDER — OXYCODONE HCL 5 MG PO TABS: 5.0000 mg | ORAL_TABLET | ORAL | 0 refills | Status: DC | PRN

## 2021-04-26 MED ORDER — METHOCARBAMOL 500 MG PO TABS: 500.0000 mg | ORAL_TABLET | Freq: Four times a day (QID) | ORAL | 1 refills | Status: DC | PRN

## 2021-04-26 MED ORDER — GABAPENTIN 300 MG PO CAPS: 300.0000 mg | ORAL_CAPSULE | Freq: Three times a day (TID) | ORAL | 2 refills | Status: DC

## 2021-04-26 MED ORDER — DOCUSATE SODIUM 100 MG PO CAPS: 100.0000 mg | ORAL_CAPSULE | Freq: Two times a day (BID) | ORAL | 0 refills | Status: DC

## 2021-04-26 MED ORDER — OXYCODONE HCL ER 20 MG PO T12A: 20.0000 mg | EXTENDED_RELEASE_TABLET | Freq: Two times a day (BID) | ORAL | 0 refills | Status: DC

## 2021-04-26 MED ORDER — PREDNISONE 10 MG (21) PO TBPK: ORAL_TABLET | ORAL | 0 refills | Status: DC

## 2021-04-26 MED ORDER — POLYETHYLENE GLYCOL 3350 17 G PO PACK: 17.0000 g | PACK | Freq: Every day | ORAL | 0 refills | Status: DC | PRN

## 2021-04-26 NOTE — TOC Transition Note (Signed)
Transition of Care [facility]) - CM/SW Discharge Note   Patient Details  Name: YISEL MEGILL MRN: [id number] Date of Birth: 07-May-1965  [facility] CM/SW Contact:  Angelita Ingles, RN Phone Number:[phone]  04/26/2021, 11:54 AM   Clinical Narrative:    [facility] consulted for patient with [facility] & DME orders. Per therapy notes there are no HH recommendations however MD has entered orders. CM at bedside to speak with patient to discuss orders and offer choices. Per patient she has been ambulating independently around unit and stair trials without difficulty and states that PT has released her. Patient is declining home health PT stating she doesn't need it . CM made patient aware of DME recommendations. Patient states that she has not needed BSC or walker at hospital and does not think that she will need it at home. No other needs noted at this time . TOC will sign off.          Patient Goals and CMS Choice        Discharge Placement                       Discharge Plan and Services                                     Social Determinants of Health (SDOH) Interventions     Readmission Risk Interventions No flowsheet data found.

## 2021-04-26 NOTE — Telephone Encounter (Signed)
Patient called advised per the pharmacy the Rx for OxyContin need prior auth before the Rx can be filled. Patient said she will need to get the medication before the weekend The number to contact patient is [phone]

## 2021-04-26 NOTE — Progress Notes (Signed)
Speech Language Pathology Treatment: Dysphagia  Patient Details Name: Megan Jimenez MRN: [id number] DOB: 02-11-1966 Today's Date: 04/26/2021 Time: 1202-1210 SLP Time Calculation (min) (ACUTE ONLY): 8 min  Assessment / Plan / Recommendation Clinical Impression  Pt reports her swallow function has improved since yesterday and had an easier time swallowing her pills. No s/sx aspiration with straw sips water. Discussed strategies to facilitate ease of swallow. Continue regular/thin. Being discharged today. No f/u needed   HPI HPI: 56 y.o. female presents to [facility] hospital on 04/23/2021 for elective C4-7 ACDF. PMH includes asthma, bipolar disorder, COVID-19.      SLP Plan  All goals met      Recommendations for follow up therapy are one component of a multi-disciplinary discharge planning process, led by the attending physician.  Recommendations may be updated based on patient status, additional functional criteria and insurance authorization.    Recommendations  Diet recommendations: Regular;Thin liquid Liquids provided via: Cup;Straw Medication Administration: Whole meds with liquid Supervision: Patient able to self feed                Oral Care Recommendations: Oral care BID Follow Up Recommendations: No SLP follow up SLP Visit Diagnosis: Dysphagia, unspecified (R13.10) Plan: All goals met           Houston Siren  04/26/2021, 12:47 PM

## 2021-04-26 NOTE — Telephone Encounter (Signed)
I called [facility] [phone] was advised oxycontin 20mg  not on members formulary and they required additional information and clinics to be faxed to [employer] to initiate appeal . I spoke with someone at [employer] they said could take 3-5 business days. I talked with patient and she advised not to proceed with PA that she would call the pharmacy and pay for it out of pocket.

## 2021-04-26 NOTE — Progress Notes (Addendum)
° ° ° °  Subjective: 3 Days Post-Op Procedure(s) (LRB): ANTERIOR CERVICAL DISCECTOMIES AND FUSION C4-5, C5-6 AND C6-7 WITH PLATES, SCREWS, ALLOGRAFT ACDF GRAFT, LOCAL BONE GRAFT, VIVIGEN (N/A) LEFT OPEN CARPAL TUNNEL RELEASE (Left) Awake, alert and oriented x 4, dressing is dry, slept well, no SOB some productive cough, likely secretions. Motor intact up to bathroom. Patient reports pain as moderate.    Objective:   VITALS:  Temp:  [97.6 F (36.4 C)-98.5 F (36.9 C)] 97.7 F (36.5 C) (02/10 0750) Pulse Rate:  [72-89] 89 (02/10 0750) Resp:  [17-20] 18 (02/10 0750) BP: (127-146)/(64-87) 132/64 (02/10 0750) SpO2:  [92 %-97 %] 95 % (02/10 0750)  Neurologically intact ABD soft Neurovascular intact Sensation intact distally Intact pulses distally Dorsiflexion/Plantar flexion intact Incision: dressing C/D/I and no drainage Compartment soft   LABS Recent Labs    04/24/21 0645  HGB 10.9*  WBC 9.6  PLT 158   Recent Labs    04/24/21 0645  NA 137  K 4.3  CL 103  CO2 23  BUN 10  CREATININE 0.58  GLUCOSE 123*   No results for input(s): LABPT, INR in the last 72 hours.   Assessment/Plan: 3 Days Post-Op Procedure(s) (LRB): ANTERIOR CERVICAL DISCECTOMIES AND FUSION C4-5, C5-6 AND C6-7 WITH PLATES, SCREWS, ALLOGRAFT ACDF GRAFT, LOCAL BONE GRAFT, VIVIGEN (N/A) LEFT OPEN CARPAL TUNNEL RELEASE (Left)  Advance diet Up with therapy D/C IV fluids Discharge home with home health  [name] 04/26/2021, 8:48 AM Patient ID: Megan Jimenez, female   DOB: [birth year], 56 y.o.   MRN: [id number]

## 2021-05-01 ENCOUNTER — Encounter (HOSPITAL_COMMUNITY): Payer: Self-pay | Admitting: Specialist

## 2021-05-06 ENCOUNTER — Other Ambulatory Visit: Payer: Self-pay | Admitting: Family Medicine

## 2021-05-06 DIAGNOSIS — G479 Sleep disorder, unspecified: Secondary | ICD-10-CM

## 2021-05-07 NOTE — Telephone Encounter (Signed)
Requested medication (s) are due for refill today - yes  Requested medication (s) are on the active medication list -yes  Future visit scheduled -no  Last refill: 12/18/20 #90  Notes to clinic: Request RF: non delegated Rx  Requested Prescriptions  Pending Prescriptions Disp Refills   Eszopiclone 3 MG TABS [Pharmacy Med Name: ESZOPICLONE 3 MG TABLET] 90 tablet 0    Sig: TAKE 1 TABLET BY MOUTH AT BEDTIME TAKE IMMEDIATELY BEFORE BEDTIME     Not Delegated - Psychiatry:  Anxiolytics/Hypnotics Failed - 05/06/2021 10:00 PM      Failed - This refill cannot be delegated      Failed - Urine Drug Screen completed in last 360 days      Passed - Valid encounter within last 6 months    Recent Outpatient Visits           5 months ago Osteoarthritis of cervical spine, unspecified spinal osteoarthritis complication status   [facility] [place] Sprout, FNP   10 months ago Atypical pneumonia   [facility] Fenton Malling M, [place]   11 months ago Screening for colon cancer   [place], Clearnce Sorrel, [place]   1 year ago Annual physical exam   [facility] [place] Malling M, [place]   2 years ago [place], Dionne Bucy, MD                 Requested Prescriptions  Pending Prescriptions Disp Refills   Eszopiclone 3 MG TABS [Pharmacy Med Name: ESZOPICLONE 3 MG TABLET] 90 tablet 0    Sig: TAKE 1 TABLET BY MOUTH AT BEDTIME TAKE IMMEDIATELY BEFORE BEDTIME     Not Delegated - Psychiatry:  Anxiolytics/Hypnotics Failed - 05/06/2021 10:00 PM      Failed - This refill cannot be delegated      Failed - Urine Drug Screen completed in last 360 days      Passed - Valid encounter within last 6 months    Recent Outpatient Visits           5 months ago Osteoarthritis of cervical spine, unspecified spinal osteoarthritis complication status   [facility] Gwyneth Sprout, FNP   10  months ago Atypical pneumonia   [facility] [place], Clearnce Sorrel, [place]   11 months ago Screening for colon cancer   [facility] [place], Clearnce Sorrel, [place]   1 year ago Annual physical exam   [facility] Fenton Malling M, [place]   2 years ago Abscess   [facility] [place], Dionne Bucy, MD

## 2021-05-08 ENCOUNTER — Telehealth: Payer: Self-pay | Admitting: Specialist

## 2021-05-08 ENCOUNTER — Ambulatory Visit (INDEPENDENT_AMBULATORY_CARE_PROVIDER_SITE_OTHER): Payer: 59 | Admitting: Surgery

## 2021-05-08 ENCOUNTER — Other Ambulatory Visit: Payer: Self-pay

## 2021-05-08 ENCOUNTER — Encounter: Payer: Self-pay | Admitting: Surgery

## 2021-05-08 ENCOUNTER — Ambulatory Visit: Payer: Self-pay

## 2021-05-08 ENCOUNTER — Telehealth: Payer: Self-pay

## 2021-05-08 VITALS — BP 137/83 | HR 94 | Ht 69.0 in | Wt 275.1 lb

## 2021-05-08 DIAGNOSIS — M4722 Other spondylosis with radiculopathy, cervical region: Secondary | ICD-10-CM

## 2021-05-08 DIAGNOSIS — Z981 Arthrodesis status: Secondary | ICD-10-CM

## 2021-05-08 DIAGNOSIS — Z9889 Other specified postprocedural states: Secondary | ICD-10-CM

## 2021-05-08 MED ORDER — METHOCARBAMOL 500 MG PO TABS: 500.0000 mg | ORAL_TABLET | Freq: Four times a day (QID) | ORAL | 1 refills | Status: DC | PRN

## 2021-05-08 MED ORDER — OXYCODONE-ACETAMINOPHEN 5-325 MG PO TABS: 1.0000 | ORAL_TABLET | ORAL | 0 refills | Status: DC | PRN

## 2021-05-08 NOTE — Telephone Encounter (Signed)
Scheduled for 06/06/21 @ 3pm. Patient is aware.

## 2021-05-08 NOTE — Telephone Encounter (Signed)
Pt had a post op appt today with Ricard Dillon and was told to follow up in 2 weeks with Nitka but neither have any openings for the week of 2/6. The best call back number for the pt is [phone].

## 2021-05-08 NOTE — Progress Notes (Signed)
Post-Op Visit Note   Patient: Megan Jimenez           Date of Birth: 1965-03-25           MRN: [id number] Visit Date: 05/08/2021 PCP: Jacky Kindle, FNP   Assessment & Plan:  Chief Complaint:  Chief Complaint  Patient presents with   Neck - Routine Post Op  Patient returns.  Status post C4-5, C5-6 and C6-7 ACDF and left carpal tunnel release April 23, 2021.  Doing okay.  Still having some pain but better than preop symptoms. Visit Diagnoses:  1. Other spondylosis with radiculopathy, cervical region   2. S/P cervical spinal fusion   3. S/P carpal tunnel release     Plan: Patient was given a wrist splint.  She will work gentle range of motion of her fingers and wrist.  Must continue wearing soft collar.  No driving.  Refilled medication.  Follow with Dr. Otelia Sergeant in 3 weeks for recheck.  Follow-Up Instructions: Return in about 3 weeks (around 05/29/2021) for WITH DR NITKA FOR POSTOP CERVICAL FUSION AND CARPAL TUNNEL RELEASE.   Orders:  Orders Placed This Encounter  Procedures   XR Cervical Spine 2 or 3 views   Meds ordered this encounter  Medications   methocarbamol (ROBAXIN) 500 MG tablet    Sig: Take 1 tablet (500 mg total) by mouth every 6 (six) hours as needed for muscle spasms.    Dispense:  50 tablet    Refill:  1   oxyCODONE-acetaminophen (PERCOCET/ROXICET) 5-325 MG tablet    Sig: Take 1-2 tablets by mouth every 4 (four) hours as needed for severe pain.    Dispense:  40 tablet    Refill:  0    Imaging: No results found.  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   Abnormal gait 11/29/2020   Numbness and tingling 11/29/2020   Need for shingles vaccine 11/29/2020   Need for hepatitis C screening test 11/29/2020   Weakness of both lower extremities 11/29/2020   Muscle spasm of back 11/29/2020   Mild intermittent asthma without complication 11/29/2020   Family history of breast cancer 11/28/2020   Family history of ovarian cancer 11/28/2020   Family history of colon cancer 11/28/2020   Family history of throat cancer 11/28/2020   Family history of lung cancer 11/28/2020   Family history of leukemia 11/28/2020   Allergic rhinitis 11/17/2014   Anxiety 09/08/2014   Acute asthma exacerbation 09/08/2014   Affective bipolar disorder (HCC) 09/08/2014   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/08/2014   Headache, migraine 09/08/2014   Excess, menstruation 09/08/2014   Past Medical History:  Diagnosis Date   Asthma    Bipolar 1 disorder (HCC)    COVID-19 03/29/2021   Family history of breast cancer    Family history of colon cancer    Family history of leukemia    Family history of lung cancer    Family history of ovarian cancer    Family history of throat cancer     Family History  Problem Relation Age of  Onset   COPD Mother    Hyperlipidemia Mother    Lung cancer Mother 60   Hyperlipidemia Father    Leukemia Father 55   COPD Father    Depression Sister        ANXIETY   Healthy Sister    Breast cancer Maternal Aunt        dx 30s, recurrence 40s   Throat cancer Maternal Uncle    Lung cancer Paternal Uncle    Colon cancer Maternal Grandmother    Ovarian cancer Cousin 33       paternal   Ovarian cancer Cousin 66    Past Surgical History:  Procedure Laterality Date   ANKLE FRACTURE SURGERY Right 1992   ANTERIOR CERVICAL DECOMP/DISCECTOMY FUSION N/A 04/23/2021   Procedure: ANTERIOR CERVICAL DISCECTOMIES AND FUSION C4-5, C5-6 AND C6-7 WITH PLATES, SCREWS, ALLOGRAFT ACDF GRAFT, LOCAL BONE GRAFT, VIVIGEN;   Surgeon: Kerrin Champagne, MD;  Location: [facility] OR;  Service: Orthopedics;  Laterality: N/A;   CARPAL TUNNEL RELEASE Left 04/23/2021   Procedure: LEFT OPEN CARPAL TUNNEL RELEASE;  Surgeon: Kerrin Champagne, MD;  Location: [facility] OR;  Service: Orthopedics;  Laterality: Left;   CHOLECYSTECTOMY     COLONOSCOPY WITH PROPOFOL N/A 07/09/2020   Procedure: COLONOSCOPY WITH PROPOFOL;  Surgeon: Wyline Mood, MD;  Location: [facility];  Service: Gastroenterology;  Laterality: N/A;   Social History   Occupational History    Comment: Full-time  Tobacco Use   Smoking status: Former    Packs/day: 0.50    Years: 25.00    Pack years: 12.50    Types: Cigarettes    Quit date: 03/17/2004    Years since quitting: 17.2   Smokeless tobacco: Never  Vaping Use   Vaping Use: Some days   Substances: Flavoring  Substance and Sexual Activity   Alcohol use: Yes    Comment: occ   Drug use: No   Sexual activity: Not on file    Exam Pleasant female alert and oriented in no acute distress.  Incisions look good.  No drainage or signs of infection.

## 2021-05-08 NOTE — Discharge Summary (Signed)
Patient ID: Megan Jimenez MRN: [id number] DOB/AGE: [birth year] 57 y.o.  Admit date: 04/23/2021 Discharge date: 04/26/2021  Admission Diagnoses:  Principal Problem:   Herniated nucleus pulposus, cervical Active Problems:   Other spondylosis with radiculopathy, cervical region   Spinal stenosis of cervical region   S/P cervical spinal fusion   Discharge Diagnoses:  Principal Problem:   Herniated nucleus pulposus, cervical Active Problems:   Other spondylosis with radiculopathy, cervical region   Spinal stenosis of cervical region   S/P cervical spinal fusion  status post Procedure(s): ANTERIOR CERVICAL DISCECTOMIES AND FUSION C4-5, C5-6 AND C6-7 WITH PLATES, SCREWS, ALLOGRAFT ACDF GRAFT, LOCAL BONE GRAFT, VIVIGEN LEFT OPEN CARPAL TUNNEL RELEASE  Past Medical History:  Diagnosis Date   Asthma    Bipolar 1 disorder (Megan Jimenez)    COVID-19 03/29/2021   Family history of breast cancer    Family history of colon cancer    Family history of leukemia    Family history of lung cancer    Family history of ovarian cancer    Family history of throat cancer     Surgeries: Procedure(s): ANTERIOR CERVICAL DISCECTOMIES AND FUSION C4-5, C5-6 AND C6-7 WITH PLATES, SCREWS, ALLOGRAFT ACDF GRAFT, LOCAL BONE GRAFT, VIVIGEN LEFT OPEN CARPAL TUNNEL RELEASE on 04/23/2021   Consultants:   Discharged Condition: Improved  Hospital Course: Megan Jimenez is an 56 y.o. female who was admitted 04/23/2021 for operative treatment of Herniated nucleus pulposus, cervical. Patient failed conservative treatments (please see the history and physical for the specifics) and had severe unremitting pain that affects sleep, daily activities and work/hobbies. After pre-op clearance, the patient was taken to the operating room on 04/23/2021 and underwent  Procedure(s): ANTERIOR CERVICAL DISCECTOMIES AND FUSION C4-5, C5-6 AND C6-7 WITH PLATES, SCREWS, ALLOGRAFT ACDF GRAFT, LOCAL BONE GRAFT, VIVIGEN LEFT OPEN CARPAL  TUNNEL RELEASE.    Patient was given perioperative antibiotics:  Anti-infectives (From admission, onward)    Start     Dose/Rate Route Frequency Ordered Stop   04/23/21 1730  ceFAZolin (ANCEF) IVPB 2g/100 mL premix        2 g 200 mL/hr over 30 Minutes Intravenous Every 8 hours 04/23/21 1630 04/24/21 0113   04/23/21 0615  ceFAZolin (ANCEF) IVPB 3g/100 mL premix        3 g 200 mL/hr over 30 Minutes Intravenous On call to O.R. 04/23/21 0603 04/23/21 1155        Patient was given sequential compression devices and early ambulation to prevent DVT.   Patient benefited maximally from hospital stay and there were no complications. At the time of discharge, the patient was urinating/moving their bowels without difficulty, tolerating a regular diet, pain is controlled with oral pain medications and they have been cleared by PT/OT.   Recent vital signs: No data found.   Recent laboratory studies: No results for input(s): WBC, HGB, HCT, PLT, NA, K, CL, CO2, BUN, CREATININE, GLUCOSE, INR, CALCIUM in the last 72 hours.  Invalid input(s): PT, 2   Discharge Medications:   Allergies as of 04/26/2021   No Known Allergies      Medication List     STOP taking these medications    doxycycline 100 MG tablet Commonly known as: VIBRA-TABS   HYDROcodone-acetaminophen 5-325 MG tablet Commonly known as: NORCO/VICODIN   meloxicam 15 MG tablet Commonly known as: MOBIC       TAKE these medications    albuterol (2.5 MG/3ML) 0.083% nebulizer solution Commonly known as: PROVENTIL Take 3 mLs (2.5 mg  total) by nebulization every 6 (six) hours as needed for wheezing or shortness of breath.   albuterol 108 (90 Base) MCG/ACT inhaler Commonly known as: VENTOLIN HFA Inhale 1-2 puffs into the lungs every 4 (four) hours as needed for wheezing or shortness of breath.   ALPRAZolam 1 MG tablet Commonly known as: XANAX Take 1 mg by mouth 4 (four) times daily as needed for anxiety.   B-complex with  vitamin C tablet Take 1 tablet by mouth daily.   Caplyta 42 MG capsule Generic drug: lumateperone tosylate Take 42 mg by mouth at bedtime.   cyclobenzaprine 5 MG tablet Commonly known as: FLEXERIL Take 1 tablet (5 mg total) by mouth 3 (three) times daily as needed for muscle spasms.   docusate sodium 100 MG capsule Commonly known as: COLACE Take 1 capsule (100 mg total) by mouth 2 (two) times daily.   ferrous sulfate 325 (65 FE) MG tablet Take 325 mg by mouth daily with breakfast.   fluticasone 50 MCG/ACT nasal spray Commonly known as: FLONASE Place 2 sprays into both nostrils daily.   fluticasone furoate-vilanterol 200-25 MCG/ACT Aepb Commonly known as: BREO ELLIPTA Inhale 1 puff into the lungs daily.   gabapentin 300 MG capsule Commonly known as: NEURONTIN Take 1 capsule (300 mg total) by mouth 3 (three) times daily. What changed: See the new instructions.   hydrOXYzine 25 MG tablet Commonly known as: ATARAX Take 1 tablet (25 mg total) by mouth every 6 (six) hours as needed for anxiety (start giving every 6 hours for 2 doses then prn).   loratadine 10 MG tablet Commonly known as: CLARITIN Take 10 mg by mouth daily.   magnesium gluconate 500 MG tablet Commonly known as: MAGONATE Take 500 mg by mouth daily.   methocarbamol 500 MG tablet Commonly known as: ROBAXIN Take 1 tablet (500 mg total) by mouth every 6 (six) hours as needed for muscle spasms.   montelukast 10 MG tablet Commonly known as: SINGULAIR Take 1 tablet (10 mg total) by mouth at bedtime.   multivitamin with minerals Tabs tablet Take 1 tablet by mouth daily.   oxyCODONE 5 MG immediate release tablet Commonly known as: Oxy IR/ROXICODONE Take 1 tablet (5 mg total) by mouth every 3 (three) hours as needed for moderate pain ((score 4 to 6)).   oxyCODONE 20 mg 12 hr tablet Commonly known as: OXYCONTIN Take 1 tablet (20 mg total) by mouth every 12 (twelve) hours.   phenol 1.4 % Liqd Commonly known  as: CHLORASEPTIC Use as directed 1 spray in the mouth or throat as needed for throat irritation / pain.   polyethylene glycol 17 g packet Commonly known as: MIRALAX / GLYCOLAX Take 17 g by mouth daily as needed for mild constipation.   predniSONE 10 MG (21) Tbpk tablet Commonly known as: STERAPRED UNI-PAK 21 TAB Use as directed   vitamin B-12 500 MCG tablet Commonly known as: CYANOCOBALAMIN Take 500 mcg by mouth daily.   vitamin C 1000 MG tablet Take 1,000 mg by mouth daily.   Vitamin D (Ergocalciferol) 1.25 MG (50000 UNIT) Caps capsule Commonly known as: DRISDOL Take 50,000 Units by mouth every Friday.        Diagnostic Studies: DG Cervical Spine 2 or 3 views  Result Date: 04/25/2021 CLINICAL DATA:  Three level ACDF. Trouble swallowing and eating after surgery. EXAM: CERVICAL SPINE - 2-3 VIEW COMPARISON:  Intraoperative fluoroscopy cervical spine 04/23/2021 MRI cervical spine 01/10/2021 FINDINGS: Postsurgical changes are seen of C4 through C7 ACDF.  Associated intervertebral disc spacers. No evidence of hardware failure. Minimal 2 mm grade 1 anterolisthesis of C2 on C3 and C3 on C4. Vertebral body heights are maintained. The atlantodens interval is intact. A cervical collar is noted. There is moderate prevertebral soft tissue swelling consistent with recent surgery. The lung apices are clear. IMPRESSION: Moderate prevertebral soft tissue swelling consistent with recent C4 through C7 ACDF. No evidence of hardware failure. Electronically Signed   By: Yvonne Kendall M.D.   On: 04/25/2021 10:09   DG Cervical Spine 2 or 3 views  Result Date: 04/23/2021 CLINICAL DATA:  Cervical spondylosis fluoroscopic assistance for cervical fusion EXAM: CERVICAL SPINE - 2-3 VIEW COMPARISON:  None. FINDINGS: Fluoroscopic images show anterior surgical fusion from C4-C7 levels. Intervertebral disc spaces are noted in place. IMPRESSION: Fluoroscopic assistance was provided for anterior cervical fusion.  Electronically Signed   By: Elmer Picker M.D.   On: 04/23/2021 13:07   DG C-Arm 1-60 Min-No Report  Result Date: 04/23/2021 Fluoroscopy was utilized by the requesting physician.  No radiographic interpretation.   DG C-Arm 1-60 Min-No Report  Result Date: 04/23/2021 Fluoroscopy was utilized by the requesting physician.  No radiographic interpretation.   DG C-Arm 1-60 Min-No Report  Result Date: 04/23/2021 Fluoroscopy was utilized by the requesting physician.  No radiographic interpretation.    Discharge Instructions     Call MD / Call 911   Complete by: As directed    If you experience chest pain or shortness of breath, CALL 911 and be transported to the hospital emergency room.  If you develope a fever above 101 F, pus (white drainage) or increased drainage or redness at the wound, or calf pain, call your surgeon's office.   Constipation Prevention   Complete by: As directed    Drink plenty of fluids.  Prune juice may be helpful.  You may use a stool softener, such as Colace (over the counter) 100 mg twice a day.  Use MiraLax (over the counter) for constipation as needed.   Diet - low sodium heart healthy   Complete by: As directed    Discharge instructions   Complete by: As directed    Keep dressing dry. Elevated left wrist above heart. Apply ice to palm side of left wrist two hours on and one half hour off for 48 hours. May Apply ice at night and go to sleep with out changing. Be sure to keep ice off fingers to prevent frost bite.  Return to office in two weeks for removal of sutures left wrist.   No lifting greater than 10 lbs. No overhead use of arms. Avoid bending,and twisting neck. Walk in house for first week them may start to get out slowly increasing distance up to one half mile by 3 weeks post op. Keep incision dry for 3 days, may then bathe and wet incision using a [place] collar when showering. Call if any fevers >101, chills, or increasing numbness or weakness  or increased swelling or drainage.   Driving restrictions   Complete by: As directed    No driving for 6 weeks   Increase activity slowly as tolerated   Complete by: As directed    Lifting restrictions   Complete by: As directed    No lifting for 8 weeks   Post-operative opioid taper instructions:   Complete by: As directed    POST-OPERATIVE OPIOID TAPER INSTRUCTIONS: It is important to wean off of your opioid medication as soon as possible. If you do not  need pain medication after your surgery it is ok to stop day one. Opioids include: Codeine, Hydrocodone(Norco, Vicodin), Oxycodone(Percocet, oxycontin) and hydromorphone amongst others.  Long term and even short term use of opiods can cause: Increased pain response Dependence Constipation Depression Respiratory depression And more.  Withdrawal symptoms can include Flu like symptoms Nausea, vomiting And more Techniques to manage these symptoms Hydrate well Eat regular healthy meals Stay active Use relaxation techniques(deep breathing, meditating, yoga) Do Not substitute Alcohol to help with tapering If you have been on opioids for less than two weeks and do not have pain than it is ok to stop all together.  Plan to wean off of opioids This plan should start within one week post op of your joint replacement. Maintain the same interval or time between taking each dose and first decrease the dose.  Cut the total daily intake of opioids by one tablet each day Next start to increase the time between doses. The last dose that should be eliminated is the evening dose.           Follow-up Information     Jessy Oto, MD Follow up in 2 week(s).   Specialty: Orthopedic Surgery Why: For wound re-check, For suture removal Contact information: [address] [place] [address] [phone]                 Discharge Plan:  discharge to home  Disposition:     Signed: Benjiman Core 05/08/2021, 2:43  PM

## 2021-05-08 NOTE — Telephone Encounter (Signed)
Patient called into the office stating that her medication has not been sent in yet and she would like to pick it up today.

## 2021-05-08 NOTE — Telephone Encounter (Signed)
I called and lmom for pt that [facility] sent in the Robaxin, and that he is waiting on Dr. Louanne Skye to advise on the pain meds. ---Jeneen Rinks called and he is sending in Percocet 7.5mg  to her pharmacy for her

## 2021-05-09 ENCOUNTER — Other Ambulatory Visit: Payer: Self-pay | Admitting: Family Medicine

## 2021-05-09 DIAGNOSIS — M47814 Spondylosis without myelopathy or radiculopathy, thoracic region: Secondary | ICD-10-CM

## 2021-05-09 DIAGNOSIS — M47812 Spondylosis without myelopathy or radiculopathy, cervical region: Secondary | ICD-10-CM

## 2021-05-09 NOTE — Telephone Encounter (Signed)
Requested medication (s) are due for refill today - no  Requested medication (s) are on the active medication list -no  Future visit scheduled -no  Last refill: no longer current on list  Notes to clinic: Request RF: medication discontinued 04/26/21- stop taking at discharge- request sent for review   Requested Prescriptions  Pending Prescriptions Disp Refills   meloxicam (MOBIC) 15 MG tablet [Pharmacy Med Name: MELOXICAM 15 MG TABLET] 30 tablet 2    Sig: TAKE 1 TABLET (15 MG TOTAL) BY MOUTH DAILY.     Analgesics:  COX2 Inhibitors Failed - 05/09/2021  8:46 AM      Failed - Manual Review: Labs are only required if the patient has taken medication for more than 8 weeks.      Failed - HGB in normal range and within 360 days    Hemoglobin  Date Value Ref Range Status  04/24/2021 10.9 (L) 12.0 - 15.0 g/dL Final  11/29/2020 12.3 11.1 - 15.9 g/dL Final          Failed - HCT in normal range and within 360 days    HCT  Date Value Ref Range Status  04/24/2021 34.4 (L) 36.0 - 46.0 % Final   Hematocrit  Date Value Ref Range Status  11/29/2020 36.4 34.0 - 46.6 % Final          Passed - Cr in normal range and within 360 days    Creatinine  Date Value Ref Range Status  10/19/2013 0.53 (L) 0.60 - 1.30 mg/dL Final   Creatinine, Ser  Date Value Ref Range Status  04/24/2021 0.58 0.44 - 1.00 mg/dL Final          Passed - AST in normal range and within 360 days    AST  Date Value Ref Range Status  03/29/2021 22 15 - 41 U/L Final   SGOT(AST)  Date Value Ref Range Status  02/19/2013 22 15 - 37 Unit/L Final          Passed - ALT in normal range and within 360 days    ALT  Date Value Ref Range Status  03/29/2021 21 0 - 44 U/L Final   SGPT (ALT)  Date Value Ref Range Status  02/19/2013 24 12 - 78 U/L Final          Passed - eGFR is 30 or above and within 360 days    EGFR (African American)  Date Value Ref Range Status  10/19/2013 >60  Final   GFR calc Af Amer  Date  Value Ref Range Status  04/26/2020 119 >59 mL/min/1.73 Final    Comment:    **In accordance with recommendations from the NKF-ASN Task force,**   [facility] is in the process of updating its eGFR calculation to the   2021 CKD-EPI creatinine equation that estimates kidney function   without a race variable.    EGFR (Non-African Amer.)  Date Value Ref Range Status  10/19/2013 >60  Final    Comment:    eGFR values <72mL/min/1.73 m2 may be an indication of chronic kidney disease (CKD). Calculated eGFR is useful in patients with stable renal function. The eGFR calculation will not be reliable in acutely ill patients when serum creatinine is changing rapidly. It is not useful in  patients on dialysis. The eGFR calculation may not be applicable to patients at the low and high extremes of body sizes, pregnant women, and vegetarians.    GFR, Estimated  Date Value Ref Range Status  04/24/2021 >  60 >60 mL/min Final    Comment:    (NOTE) Calculated using the CKD-EPI Creatinine Equation (2021)    eGFR  Date Value Ref Range Status  11/29/2020 108 >59 mL/min/1.73 Final          Passed - Patient is not pregnant      Passed - Valid encounter within last 12 months    Recent Outpatient Visits           5 months ago Osteoarthritis of cervical spine, unspecified spinal osteoarthritis complication status   [facility] Gwyneth Sprout, FNP   10 months ago Atypical pneumonia   [facility] Fenton Malling M, [place]   11 months ago Screening for colon cancer   [place], Clearnce Sorrel, [place]   1 year ago Annual physical exam   [facility] Fenton Malling M, [place]   2 years ago [place], Dionne Bucy, MD                 Requested Prescriptions  Pending Prescriptions Disp Refills   meloxicam (MOBIC) 15 MG tablet [Pharmacy Med Name: MELOXICAM 15 MG TABLET] 30 tablet 2    Sig: TAKE  1 TABLET (15 MG TOTAL) BY MOUTH DAILY.     Analgesics:  COX2 Inhibitors Failed - 05/09/2021  8:46 AM      Failed - Manual Review: Labs are only required if the patient has taken medication for more than 8 weeks.      Failed - HGB in normal range and within 360 days    Hemoglobin  Date Value Ref Range Status  04/24/2021 10.9 (L) 12.0 - 15.0 g/dL Final  11/29/2020 12.3 11.1 - 15.9 g/dL Final          Failed - HCT in normal range and within 360 days    HCT  Date Value Ref Range Status  04/24/2021 34.4 (L) 36.0 - 46.0 % Final   Hematocrit  Date Value Ref Range Status  11/29/2020 36.4 34.0 - 46.6 % Final          Passed - Cr in normal range and within 360 days    Creatinine  Date Value Ref Range Status  10/19/2013 0.53 (L) 0.60 - 1.30 mg/dL Final   Creatinine, Ser  Date Value Ref Range Status  04/24/2021 0.58 0.44 - 1.00 mg/dL Final          Passed - AST in normal range and within 360 days    AST  Date Value Ref Range Status  03/29/2021 22 15 - 41 U/L Final   SGOT(AST)  Date Value Ref Range Status  02/19/2013 22 15 - 37 Unit/L Final          Passed - ALT in normal range and within 360 days    ALT  Date Value Ref Range Status  03/29/2021 21 0 - 44 U/L Final   SGPT (ALT)  Date Value Ref Range Status  02/19/2013 24 12 - 78 U/L Final          Passed - eGFR is 30 or above and within 360 days    EGFR (African American)  Date Value Ref Range Status  10/19/2013 >60  Final   GFR calc Af Amer  Date Value Ref Range Status  04/26/2020 119 >59 mL/min/1.73 Final    Comment:    **In accordance with recommendations from the NKF-ASN Task force,**   [facility] is in the process of updating  its eGFR calculation to the   2021 CKD-EPI creatinine equation that estimates kidney function   without a race variable.    EGFR (Non-African Amer.)  Date Value Ref Range Status  10/19/2013 >60  Final    Comment:    eGFR values <75mL/min/1.73 m2 may be an indication of  chronic kidney disease (CKD). Calculated eGFR is useful in patients with stable renal function. The eGFR calculation will not be reliable in acutely ill patients when serum creatinine is changing rapidly. It is not useful in  patients on dialysis. The eGFR calculation may not be applicable to patients at the low and high extremes of body sizes, pregnant women, and vegetarians.    GFR, Estimated  Date Value Ref Range Status  04/24/2021 >60 >60 mL/min Final    Comment:    (NOTE) Calculated using the CKD-EPI Creatinine Equation (2021)    eGFR  Date Value Ref Range Status  11/29/2020 108 >59 mL/min/1.73 Final          Passed - Patient is not pregnant      Passed - Valid encounter within last 12 months    Recent Outpatient Visits           5 months ago Osteoarthritis of cervical spine, unspecified spinal osteoarthritis complication status   [facility] Gwyneth Sprout, FNP   10 months ago Atypical pneumonia   [facility] Fenton Malling M, [place]   11 months ago Screening for colon cancer   [place], Clearnce Sorrel, [place]   1 year ago Annual physical exam   [facility] [place], [place]   2 years ago Abscess   [facility] [place], Dionne Bucy, MD

## 2021-05-16 ENCOUNTER — Encounter: Payer: Self-pay | Admitting: Internal Medicine

## 2021-05-16 ENCOUNTER — Ambulatory Visit (INDEPENDENT_AMBULATORY_CARE_PROVIDER_SITE_OTHER): Payer: 59 | Admitting: Internal Medicine

## 2021-05-16 ENCOUNTER — Telehealth: Payer: Self-pay | Admitting: Internal Medicine

## 2021-05-16 ENCOUNTER — Other Ambulatory Visit: Payer: Self-pay

## 2021-05-16 ENCOUNTER — Other Ambulatory Visit
Admission: RE | Admit: 2021-05-16 | Discharge: 2021-05-16 | Disposition: A | Discharge status: Home / Self Care | Payer: 59 | Source: Ambulatory Visit | Attending: Internal Medicine | Admitting: Internal Medicine

## 2021-05-16 DIAGNOSIS — J449 Chronic obstructive pulmonary disease, unspecified: Secondary | ICD-10-CM

## 2021-05-16 DIAGNOSIS — J4489 Other specified chronic obstructive pulmonary disease: Secondary | ICD-10-CM | POA: Insufficient documentation

## 2021-05-16 LAB — CBC WITH DIFFERENTIAL/PLATELET
Abs Immature Granulocytes: 0.01 10*3/uL (ref 0.00–0.07)
Basophils Absolute: 0 10*3/uL (ref 0.0–0.1)
Basophils Relative: 1 %
Eosinophils Absolute: 0.2 10*3/uL (ref 0.0–0.5)
Eosinophils Relative: 3 %
HCT: 36.2 % (ref 36.0–46.0)
Hemoglobin: 11.5 g/dL — ABNORMAL LOW (ref 12.0–15.0)
Immature Granulocytes: 0 %
Lymphocytes Relative: 23 %
Lymphs Abs: 1.5 10*3/uL (ref 0.7–4.0)
MCH: 30.4 pg (ref 26.0–34.0)
MCHC: 31.8 g/dL (ref 30.0–36.0)
MCV: 95.8 fL (ref 80.0–100.0)
Monocytes Absolute: 0.6 10*3/uL (ref 0.1–1.0)
Monocytes Relative: 9 %
Neutro Abs: 4.3 10*3/uL (ref 1.7–7.7)
Neutrophils Relative %: 64 %
Platelets: 218 10*3/uL (ref 150–400)
RBC: 3.78 MIL/uL — ABNORMAL LOW (ref 3.87–5.11)
RDW: 12.5 % (ref 11.5–15.5)
WBC: 6.5 10*3/uL (ref 4.0–10.5)
nRBC: 0 % (ref 0.0–0.2)

## 2021-05-16 MED ORDER — BUDESONIDE-FORMOTEROL FUMARATE 160-4.5 MCG/ACT IN AERO: INHALATION_SPRAY | RESPIRATORY_TRACT | 12 refills | Status: DC

## 2021-05-16 NOTE — Progress Notes (Addendum)
? ?Megan Jimenez, female    DOB: [birth year],   MRN: [id number] ? ? ?Brief patient profile:  ?55  yowf   quit smoking 2006 wt at 61  born prematurely   onset around 2000 and worse episodes p quit to point where needed freq inhalers  referred to pulmonary clinic in [facility]  05/16/2021 with daily need for inhalers since 2017 last in Jan 2023 and improved BREO 100 x 2022  ? ? ? ? ?History of Present Illness  ?05/16/2021  Pulmonary/ 1st office eval/ Melvyn Novas / [place]  ?Chief Complaint  ?Patient presents with  ? pulmonary consult  ?  Hx of asthma--recent course of prednisone and abx. C/o sob with exertion.    ?Dyspnea:  at best can do [employer]/ pushes cart / and mb flat 100 ft  ?Cough: throat clearing /slt yellowsih sometimes  ?Sleep: flat bed one pillow  ?SABA use: varies  with activity level ? ?No obvious day to day or daytime variability or assoc excess/ purulent sputum or mucus plugs or hemoptysis or cp or chest tightness, subjective wheeze or overt sinus or hb symptoms.  ? ?Sleeping  without nocturnal  or early am exacerbation  of respiratory  c/o's or need for noct saba. Also denies any obvious fluctuation of symptoms with weather or environmental changes or other aggravating or alleviating factors except as outlined above  ? ?No unusual exposure hx or h/o childhood pna/ asthma or   ? ?Current Allergies, Complete Past Medical History, Past Surgical History, Family History, and Social History were reviewed in [employer] record. ? ?ROS  The following are not active complaints unless bolded ?Hoarseness, sore throat, dysphagia, dental problems, itching, sneezing,  nasal congestion or discharge of excess mucus or purulent secretions, ear ache,   fever, chills, sweats, unintended wt loss or wt gain, classically pleuritic or exertional cp,  orthopnea pnd or arm/hand swelling  or leg swelling, presyncope, palpitations, abdominal pain, anorexia, nausea, vomiting, diarrhea  or change in bowel  habits or change in bladder habits, change in stools or change in urine, dysuria, hematuria,  rash, arthralgias, visual complaints, headache, numbness, weakness or ataxia or problems with walking or coordination,  change in mood or  memory. ?      ?   ? ?Past Medical History:  ?Diagnosis Date  ? Asthma   ? Bipolar 1 disorder ([place])   ? COVID-19 03/29/2021  ? Family history of breast cancer   ? Family history of colon cancer   ? Family history of leukemia   ? Family history of lung cancer   ? Family history of ovarian cancer   ? Family history of throat cancer   ? ? ?Outpatient Medications Prior to Visit  ?Medication Sig Dispense Refill  ? albuterol (PROVENTIL) (2.5 MG/3ML) 0.083% nebulizer solution Take 3 mLs (2.5 mg total) by nebulization every 6 (six) hours as needed for wheezing or shortness of breath. 75 mL 0  ? albuterol (VENTOLIN HFA) 108 (90 Base) MCG/ACT inhaler Inhale 1-2 puffs into the lungs every 4 (four) hours as needed for wheezing or shortness of breath. 18 each 6  ? ALPRAZolam (XANAX) 1 MG tablet Take 1 mg by mouth 4 (four) times daily as needed for anxiety.    ? Ascorbic Acid (VITAMIN C) 1000 MG tablet Take 1,000 mg by mouth daily.    ? B Complex-C (B-COMPLEX WITH VITAMIN C) tablet Take 1 tablet by mouth daily.    ? CAPLYTA 42 MG CAPS  Take 42 mg by mouth at bedtime.    ? Eszopiclone 3 MG TABS TAKE 1 TABLET BY MOUTH AT BEDTIME TAKE IMMEDIATELY BEFORE BEDTIME 90 tablet 0  ? ferrous sulfate 325 (65 FE) MG tablet Take 325 mg by mouth daily with breakfast.    ? fluticasone (FLONASE) 50 MCG/ACT nasal spray Place 2 sprays into both nostrils daily. 16 g 2  ? fluticasone furoate-vilanterol (BREO ELLIPTA) 200-25 MCG/ACT AEPB Inhale 1 puff into the lungs daily. 60 each 5  ? gabapentin (NEURONTIN) 300 MG capsule Take 1 capsule (300 mg total) by mouth 3 (three) times daily. 90 capsule 2  ? loratadine (CLARITIN) 10 MG tablet Take 10 mg by mouth daily.    ? magnesium gluconate (MAGONATE) 500 MG tablet Take 500 mg by  mouth daily.    ? montelukast (SINGULAIR) 10 MG tablet Take 1 tablet (10 mg total) by mouth at bedtime. 90 tablet 3  ? Multiple Vitamin (MULTIVITAMIN WITH MINERALS) TABS tablet Take 1 tablet by mouth daily.    ? oxyCODONE-acetaminophen (PERCOCET/ROXICET) 5-325 MG tablet Take 1-2 tablets by mouth every 4 (four) hours as needed for severe pain. 40 tablet 0  ? vitamin B-12 (CYANOCOBALAMIN) 500 MCG tablet Take 500 mcg by mouth daily.    ? Vitamin D, Ergocalciferol, (DRISDOL) 50000 units CAPS capsule Take 50,000 Units by mouth every Friday.   5  ? hydrOXYzine (ATARAX) 25 MG tablet Take 1 tablet (25 mg total) by mouth every 6 (six) hours as needed for anxiety (start giving every 6 hours for 2 doses then prn). 30 tablet 0  ? meloxicam (MOBIC) 15 MG tablet TAKE 1 TABLET (15 MG TOTAL) BY MOUTH DAILY. 30 tablet 2  ? phenol (CHLORASEPTIC) 1.4 % LIQD Use as directed 1 spray in the mouth or throat as needed for throat irritation / pain. 236 mL 1  ? polyethylene glycol (MIRALAX / GLYCOLAX) 17 g packet Take 17 g by mouth daily as needed for mild constipation. 14 each 0  ? predniSONE (STERAPRED UNI-PAK 21 TAB) 10 MG (21) TBPK tablet Use as directed 21 tablet 0  ? cyclobenzaprine (FLEXERIL) 5 MG tablet Take 1 tablet (5 mg total) by mouth 3 (three) times daily as needed for muscle spasms. (Patient not taking: Reported on 05/16/2021) 30 tablet 1  ? methocarbamol (ROBAXIN) 500 MG tablet Take 1 tablet (500 mg total) by mouth every 6 (six) hours as needed for muscle spasms. (Patient not taking: Reported on 05/16/2021) 50 tablet 1  ? docusate sodium (COLACE) 100 MG capsule Take 1 capsule (100 mg total) by mouth 2 (two) times daily. (Patient not taking: Reported on 05/16/2021) 10 capsule 0  ? ?No facility-administered medications prior to visit.  ? ? ? ?Objective:  ?  ? ?BP 128/74 (BP Location: Left Arm, Cuff Size: Large)   Pulse 82   Temp 98 ?F (36.7 ?C) (Temporal)   Ht 5\' 8"  (1.727 m)   Wt 273 lb (123.8 kg)   LMP 09/28/2018   SpO2 96%    BMI 41.51 kg/m?  ? ?SpO2: 96 % ? ?Amb morbidly obese  (By BMI) wf with classic pseudowheeze  ? ?HEENT : pt wearing mask not removed for exam due to covid - 19 concerns.  ? ?NECK :  without JVD/Nodes/TM/ nl carotid upstrokes bilaterally ? ? ?LUNGS: no acc muscle use,  Min barrel  contour chest wall with bilateral  slightly decreased bs s audible wheeze and  without cough on insp or exp maneuvers and min  Hyperresonant  to  percussion bilaterally   ? ? ?CV:  RRR  no s3 or murmur or increase in P2, and no edema  ? ?ABD:  soft and nontender with pos end  insp Hoover's  in the supine position. No bruits or organomegaly appreciated, bowel sounds nl ? ?MS:   Nl gait/  ext warm without deformities, calf tenderness, cyanosis or clubbing ?No obvious joint restrictions  ? ?SKIN: warm and dry without lesions   ? ?NEURO:  alert, approp, nl sensorium with  no motor or cerebellar deficits apparent.  ?    ?Labs ordered 05/16/2021  :  allergy profile   alpha one AT phenotype   ? ?   ?Assessment  ? ?No problem-specific Assessment & Plan notes found for this encounter. ? ? ? ? ?Christinia Gully, MD ?05/16/2021 ?    ?

## 2021-05-16 NOTE — Patient Instructions (Addendum)
Plan A = Automatic = Always=    Symbicort 160 Take 2 puffs first thing in am and then another 2 puffs about 12 hours later.  ?  ?Work on inhaler technique:  relax and gently blow all the way out then take a nice smooth full deep breath back in, triggering the inhaler at same time you start breathing in.  Hold for up to 5 seconds if you can. Blow out thru nose. Rinse and gargle with water when done.  If mouth or throat bother you at all,  try brushing teeth/gums/tongue with arm and hammer toothpaste/ make a slurry and gargle and spit out.  ? ?  Plan B = Backup (to supplement plan A, not to replace it) ?Only use your albuterol inhaler as a rescue medication to be used if you can't catch your breath by resting or doing a relaxed purse lip breathing pattern.  ?- The less you use it, the better it will work when you need it. ?- Ok to use the inhaler up to 2 puffs  every 4 hours if you must but call for appointment if use goes up over your usual need ?- Don't leave home without it !!  (think of it like the spare tire for your car)  ?  ?Ok to try albuterol 15 min before an activity (on alternating days)  that you know would usually make you short of breath and see if it makes any difference and if makes none then don't take albuterol after activity unless you can't catch your breath as this means it's the resting that helps, not the albuterol. ?    ? ?Plan C = Crisis (instead of Plan B but only if Plan B stops working) ?- only use your albuterol nebulizer if you first try Plan B and it fails to help > ok to use the nebulizer up to every 4 hours but if start needing it regularly call for immediate appointment ? ? ?  ?Please remember to go to the lab department   for your tests - we will call you with the results when they are available. ?    ? ?PFTs next available  ? ?Please schedule a follow up visit in 3 months but call sooner if needed t ?

## 2021-05-16 NOTE — Telephone Encounter (Signed)
Per Dr. Audrie Lia for patient to come in for consult. ? ?Patient is aware and voiced her understanding.  ?Nothing further needed.  ? ?

## 2021-05-17 ENCOUNTER — Encounter: Payer: Self-pay | Admitting: Internal Medicine

## 2021-05-17 NOTE — Assessment & Plan Note (Addendum)
Quit smoking 2006 ?- Labs ordered 05/16/2021  :  allergy profile   alpha one AT phenotype   ?- 05/16/2021  After extensive coaching inhaler device,  effectiveness =    80% > try symb 160 2bid   and return for pfts  ? ?I doubt she really has much copd but note wt gain since quit may be contributing to decreased ex tol so def needs to return for pfts to tease these two issues apart.  ? ?    ?  ? ?Each maintenance medication was reviewed in detail including emphasizing most importantly the difference between maintenance and prns and under what circumstances the prns are to be triggered using an action plan format where appropriate. ? ?Total time for H and P, chart review, counseling, ABC action  plan  and generating customized AVS unique to this office visit / same day charting = 21 min  ?     ?

## 2021-05-20 LAB — IGE: IgE (Immunoglobulin E), Serum: 3 IU/mL — ABNORMAL LOW (ref 6–495)

## 2021-05-20 LAB — ALPHA-1-ANTITRYPSIN PHENOTYP: A-1 Antitrypsin, Ser: 182 mg/dL (ref 101–187)

## 2021-05-21 ENCOUNTER — Telehealth: Payer: Self-pay | Admitting: Internal Medicine

## 2021-05-21 NOTE — Telephone Encounter (Signed)
Spoke to patient.  ?She reviewed labs via mychart.  ?She is concerned about IGE results.  ? ?Dr. Melvyn Novas, please advise. Thanks ?

## 2021-05-21 NOTE — Telephone Encounter (Signed)
Low levels of IgE are not an issue - it must means she's not allergic ?

## 2021-05-22 NOTE — Telephone Encounter (Signed)
Called and spoke with patient. She verbalized understanding of results.  ? ?Nothing further needed at time of call.  ?

## 2021-06-03 ENCOUNTER — Other Ambulatory Visit: Payer: Self-pay | Admitting: Family Medicine

## 2021-06-06 ENCOUNTER — Other Ambulatory Visit: Payer: Self-pay

## 2021-06-06 ENCOUNTER — Ambulatory Visit (INDEPENDENT_AMBULATORY_CARE_PROVIDER_SITE_OTHER): Payer: 59 | Admitting: Specialist

## 2021-06-06 ENCOUNTER — Encounter: Payer: Self-pay | Admitting: Specialist

## 2021-06-06 ENCOUNTER — Ambulatory Visit: Payer: Self-pay

## 2021-06-06 VITALS — BP 164/91 | HR 86 | Ht 69.0 in | Wt 275.0 lb

## 2021-06-06 DIAGNOSIS — Z981 Arthrodesis status: Secondary | ICD-10-CM

## 2021-06-06 MED ORDER — DICLOFENAC SODIUM 1 % EX GEL: 2.0000 g | Freq: Four times a day (QID) | CUTANEOUS | 1 refills | Status: DC

## 2021-06-06 MED ORDER — MEDERMA ADVANCED SCAR GEL EX GEL: 1.0000 | Freq: Three times a day (TID) | CUTANEOUS | 1 refills | Status: DC

## 2021-06-06 NOTE — Progress Notes (Signed)
? ?Post-Op Visit Note ?  ?Patient: Megan Jimenez           ?Date of Birth: 09-Dec-1965           ?MRN: [id number] ?Visit Date: 06/06/2021 ?PCP: Gwyneth Sprout, FNP ? ? ?Assessment & Plan:6 weeks post op 3 level ACDF ?Some incision suture prominence. ?Left wrist with heal incision ?Motor UE is normal ? ?Chief Complaint:  ?Chief Complaint  ?Patient presents with  ? Neck - Routine Post Op  ? Left Wrist - Routine Post Op  ? ?Visit Diagnoses:  ?1. S/P cervical spinal fusion   ? ? ?Plan: Avoid overhead lifting and overhead use of the arms. ?Do not lift greater than 5-10lbs. ?Adjust head rest in vehicle to prevent hyperextension if rear ended. ?Take extra precautions to avoid falling. ? ?Follow-Up Instructions: Return in about 6 weeks (around 07/18/2021).  ? ?Orders:  ?Orders Placed This Encounter  ?Procedures  ? XR Cervical Spine 2 or 3 views  ? ?No orders of the defined types were placed in this encounter. ? ? ?Imaging: ?No results found. ? ?PMFS History: ?Patient Active Problem List  ? Diagnosis Date Noted  ? Herniated nucleus pulposus, cervical 04/23/2021  ?  Priority: High  ?  Class: Chronic  ? Spinal stenosis of cervical region 04/23/2021  ?  Priority: High  ?  Class: Chronic  ? Asthmatic bronchitis , chronic ([place]) 05/16/2021  ? S/P cervical spinal fusion 04/23/2021  ? Carpal tunnel syndrome, left upper limb 02/28/2021  ? Genetic testing 12/12/2020  ? Other spondylosis with radiculopathy, cervical region 11/29/2020  ? Osteoarthritis of thoracic spine 11/29/2020  ? Abnormal gait 11/29/2020  ? Numbness and tingling 11/29/2020  ? Need for shingles vaccine 11/29/2020  ? Need for hepatitis C screening test 11/29/2020  ? Weakness of both lower extremities 11/29/2020  ? Muscle spasm of back 11/29/2020  ? Mild intermittent asthma without complication 05/39/7673  ? Family history of breast cancer 11/28/2020  ? Family history of ovarian cancer 11/28/2020  ? Family history of colon cancer 11/28/2020  ? Family history of throat  cancer 11/28/2020  ? Family history of lung cancer 11/28/2020  ? Family history of leukemia 11/28/2020  ? Allergic rhinitis 11/17/2014  ? Anxiety 09/08/2014  ? Acute asthma exacerbation 09/08/2014  ? Affective bipolar disorder ([place]) 09/08/2014  ? 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/08/2014  ? Headache, migraine 09/08/2014  ? Excess, menstruation 09/08/2014  ? ?Past Medical History:  ?Diagnosis Date  ? Asthma   ? Bipolar 1 disorder ([place])   ? COVID-19 03/29/2021  ? Family history of breast cancer   ? Family history of colon cancer   ? Family history of leukemia   ? Family history of lung cancer   ? Family history of ovarian cancer   ? Family history of throat cancer   ?  ?Family History  ?Problem Relation Age of Onset  ? COPD Mother   ? Hyperlipidemia Mother   ? Lung cancer Mother 8  ? Hyperlipidemia Father   ? Leukemia Father 47  ? COPD Father   ? Depression Sister   ?     ANXIETY  ? Healthy Sister   ? Breast cancer Maternal Aunt   ?     dx 38s, recurrence 61s  ? Throat cancer Maternal Uncle   ? Lung cancer Paternal Uncle   ? Colon cancer Maternal Grandmother   ? Ovarian cancer Cousin 74  ?     paternal  ? Ovarian cancer Cousin  58  ?  ?Past Surgical History:  ?Procedure Laterality Date  ? ANKLE FRACTURE SURGERY Right 1992  ? ANTERIOR CERVICAL DECOMP/DISCECTOMY FUSION N/A 04/23/2021  ? Procedure: ANTERIOR CERVICAL DISCECTOMIES AND FUSION C4-5, C5-6 AND C6-7 WITH PLATES, SCREWS, ALLOGRAFT ACDF GRAFT, LOCAL BONE GRAFT, VIVIGEN;  Surgeon: Jessy Oto, MD;  Location: [facility];  Service: Orthopedics;  Laterality: N/A;  ? CARPAL TUNNEL RELEASE Left 04/23/2021  ? Procedure: LEFT OPEN CARPAL TUNNEL RELEASE;  Surgeon: Jessy Oto, MD;  Location: [facility];  Service: Orthopedics;  Laterality: Left;  ? CHOLECYSTECTOMY    ? COLONOSCOPY WITH PROPOFOL N/A 07/09/2020  ? Procedure: COLONOSCOPY WITH PROPOFOL;  Surgeon: Jonathon Bellows, MD;  [facility] ENDOSCOPY;  Service: Gastroenterology;  Laterality: N/A;  ? ?Social History  ? ?Occupational History   ?  Comment: Full-time  ?Tobacco Use  ? Smoking status: Former  ?  Packs/day: 0.50  ?  Years: 25.00  ?  Pack years: 12.50  ?  Types: Cigarettes  ?  Quit date: 03/17/2004  ?  Years since quitting: 17.2  ? Smokeless tobacco: Never  ?Vaping Use  ? Vaping Use: Some days  ? Substances: Flavoring  ?Substance and Sexual Activity  ? Alcohol use: Yes  ?  Comment: occ  ? Drug use: No  ? Sexual activity: Not on file  ? ? ? ?

## 2021-06-07 ENCOUNTER — Other Ambulatory Visit: Payer: Self-pay | Admitting: Specialist

## 2021-06-10 ENCOUNTER — Other Ambulatory Visit: Admission: RE | Admit: 2021-06-10 | Payer: 59 | Source: Ambulatory Visit

## 2021-06-10 ENCOUNTER — Other Ambulatory Visit: Payer: Self-pay | Admitting: Specialist

## 2021-06-11 ENCOUNTER — Ambulatory Visit: Payer: 59 | Attending: Internal Medicine

## 2021-06-11 ENCOUNTER — Other Ambulatory Visit: Payer: Self-pay

## 2021-06-11 DIAGNOSIS — J449 Chronic obstructive pulmonary disease, unspecified: Secondary | ICD-10-CM | POA: Diagnosis present

## 2021-06-11 DIAGNOSIS — Z87891 Personal history of nicotine dependence: Secondary | ICD-10-CM | POA: Insufficient documentation

## 2021-06-11 DIAGNOSIS — Z7951 Long term (current) use of inhaled steroids: Secondary | ICD-10-CM | POA: Diagnosis not present

## 2021-06-11 LAB — PULMONARY FUNCTION TEST ARMC ONLY
DL/VA % pred: 117 %
DL/VA: 4.81 ml/min/mmHg/L
DLCO unc % pred: 107 %
DLCO unc: 26.12 ml/min/mmHg
FEF 25-75 Post: 1.24 L/sec
FEF 25-75 Pre: 1.1 L/sec
FEF2575-%Change-Post: 13 %
FEF2575-%Pred-Post: 44 %
FEF2575-%Pred-Pre: 38 %
FEV1-%Change-Post: 4 %
FEV1-%Pred-Post: 54 %
FEV1-%Pred-Pre: 52 %
FEV1-Post: 1.73 L
FEV1-Pre: 1.66 L
FEV1FVC-%Change-Post: -3 %
FEV1FVC-%Pred-Pre: 76 %
FEV6-%Change-Post: 7 %
FEV6-%Pred-Post: 75 %
FEV6-%Pred-Pre: 70 %
FEV6-Post: 2.96 L
FEV6-Pre: 2.76 L
FEV6FVC-%Change-Post: 0 %
FEV6FVC-%Pred-Post: 102 %
FEV6FVC-%Pred-Pre: 103 %
FVC-%Change-Post: 7 %
FVC-%Pred-Post: 73 %
FVC-%Pred-Pre: 68 %
FVC-Post: 2.98 L
FVC-Pre: 2.76 L
Post FEV1/FVC ratio: 58 %
Post FEV6/FVC ratio: 99 %
Pre FEV1/FVC ratio: 60 %
Pre FEV6/FVC Ratio: 100 %
RV % pred: 137 %
RV: 2.97 L
TLC % pred: 106 %
TLC: 6.2 L

## 2021-06-14 ENCOUNTER — Encounter: Payer: Self-pay | Admitting: Specialist

## 2021-06-14 ENCOUNTER — Encounter: Payer: Self-pay | Admitting: Family Medicine

## 2021-06-14 ENCOUNTER — Telehealth: Payer: Self-pay | Admitting: Internal Medicine

## 2021-06-14 ENCOUNTER — Ambulatory Visit (INDEPENDENT_AMBULATORY_CARE_PROVIDER_SITE_OTHER): Payer: 59 | Admitting: Family Medicine

## 2021-06-14 ENCOUNTER — Other Ambulatory Visit: Payer: Self-pay | Admitting: Family Medicine

## 2021-06-14 ENCOUNTER — Other Ambulatory Visit: Payer: Self-pay | Admitting: Specialist

## 2021-06-14 DIAGNOSIS — F3132 Bipolar disorder, current episode depressed, moderate: Secondary | ICD-10-CM | POA: Diagnosis not present

## 2021-06-14 DIAGNOSIS — J449 Chronic obstructive pulmonary disease, unspecified: Secondary | ICD-10-CM

## 2021-06-14 MED ORDER — SEMAGLUTIDE-WEIGHT MANAGEMENT 0.5 MG/0.5ML ~~LOC~~ SOAJ: 0.5000 mg | SUBCUTANEOUS | 0 refills | Status: DC

## 2021-06-14 NOTE — Assessment & Plan Note (Signed)
Chronic, stable ?F/b pulm Dr Melvyn Novas ?Now on Symbicort BID ?Wheezing heard throughout lungs ?Pt looking to loose weight to assist with breathing ?

## 2021-06-14 NOTE — Progress Notes (Signed)
?  ? ? ?I,Roshena L Chambers,acting as a scribe for Gwyneth Sprout, FNP.,have documented all relevant documentation on the behalf of Gwyneth Sprout, FNP,as directed by  Gwyneth Sprout, FNP while in the presence of Gwyneth Sprout, FNP.  ? ?Established patient visit ? ? ?Patient: Megan Jimenez   DOB: 26-Oct-1965   56 y.o. Female  MRN: [id number] ?Visit Date: 06/14/2021 ? ?Today's healthcare provider: Gwyneth Sprout, FNP  ? ?Chief Complaint  ?Patient presents with  ? Obesity  ? ?Subjective   ?  ?HPI  ?Obesity: ?Patient is here to discuss options to help aid in weight loss.  ? ?Medications: ?Outpatient Medications Prior to Visit  ?Medication Sig  ? albuterol (PROVENTIL) (2.5 MG/3ML) 0.083% nebulizer solution Take 3 mLs (2.5 mg total) by nebulization every 6 (six) hours as needed for wheezing or shortness of breath.  ? albuterol (VENTOLIN HFA) 108 (90 Base) MCG/ACT inhaler Inhale 1-2 puffs into the lungs every 4 (four) hours as needed for wheezing or shortness of breath.  ? ALPRAZolam (XANAX) 1 MG tablet Take 1 mg by mouth 4 (four) times daily as needed for anxiety.  ? Ascorbic Acid (VITAMIN C) 1000 MG tablet Take 1,000 mg by mouth daily.  ? budesonide-formoterol (SYMBICORT) 160-4.5 MCG/ACT inhaler Take 2 puffs first thing in am and then another 2 puffs about 12 hours later.  ? CAPLYTA 42 MG CAPS Take 42 mg by mouth at bedtime.  ? diclofenac Sodium (VOLTAREN) 1 % GEL APPLY 2 GRAMS TO AFFECTED AREA 4 TIMES A DAY  ? Eszopiclone 3 MG TABS TAKE 1 TABLET BY MOUTH AT BEDTIME TAKE IMMEDIATELY BEFORE BEDTIME  ? ferrous sulfate 325 (65 FE) MG tablet Take 325 mg by mouth daily with breakfast.  ? fluticasone (FLONASE) 50 MCG/ACT nasal spray SPRAY 2 SPRAYS INTO EACH NOSTRIL EVERY DAY  ? loratadine (CLARITIN) 10 MG tablet Take 10 mg by mouth daily.  ? montelukast (SINGULAIR) 10 MG tablet Take 1 tablet (10 mg total) by mouth at bedtime.  ? Multiple Vitamin (MULTIVITAMIN WITH MINERALS) TABS tablet Take 1 tablet by mouth daily.  ? Scar  Treatment Products [facility] ADVANCED SCAR GEL) GEL Apply 1 Film topically 3 (three) times daily.  ? vitamin B-12 (CYANOCOBALAMIN) 500 MCG tablet Take 500 mcg by mouth daily.  ? Vitamin D, Ergocalciferol, (DRISDOL) 50000 units CAPS capsule Take 50,000 Units by mouth every Friday.   ? [DISCONTINUED] B Complex-C (B-COMPLEX WITH VITAMIN C) tablet Take 1 tablet by mouth daily.  ? [DISCONTINUED] cyclobenzaprine (FLEXERIL) 5 MG tablet Take 1 tablet (5 mg total) by mouth 3 (three) times daily as needed for muscle spasms. (Patient not taking: Reported on 05/16/2021)  ? [DISCONTINUED] gabapentin (NEURONTIN) 300 MG capsule Take 1 capsule (300 mg total) by mouth 3 (three) times daily.  ? [DISCONTINUED] magnesium gluconate (MAGONATE) 500 MG tablet Take 500 mg by mouth daily.  ? [DISCONTINUED] methocarbamol (ROBAXIN) 500 MG tablet Take 1 tablet (500 mg total) by mouth every 6 (six) hours as needed for muscle spasms. (Patient not taking: Reported on 05/16/2021)  ? [DISCONTINUED] oxyCODONE-acetaminophen (PERCOCET/ROXICET) 5-325 MG tablet Take 1-2 tablets by mouth every 4 (four) hours as needed for severe pain.  ? ?No facility-administered medications prior to visit.  ? ? ?Review of Systems  ?Constitutional:  Negative for appetite change, chills, fatigue and fever.  ?Respiratory:  Negative for chest tightness and shortness of breath.   ?Cardiovascular:  Negative for chest pain and palpitations.  ?Gastrointestinal:  Negative for abdominal  pain, nausea and vomiting.  ?Neurological:  Negative for dizziness and weakness.  ? ? ?  Objective  ?  ?BP (!) 161/98 (BP Location: Left Arm, Patient Position: Sitting, Cuff Size: Large)   Pulse 72   Temp 98.8 ?F (37.1 ?C) (Oral)   Resp 18   Ht '5\' 9"'$  (1.753 m)   Wt 269 lb 8 oz (122.2 kg)   LMP 09/28/2018   SpO2 98%   BMI 39.80 kg/m?  ? ? ?Today's Vitals  ? 06/14/21 1332 06/14/21 1342  ?BP: (!) 138/93 (!) 161/98  ?Pulse: 72   ?Resp: 18   ?Temp: 98.8 ?F (37.1 ?C)   ?TempSrc: Oral   ?SpO2: 98%    ?Weight: 269 lb 8 oz (122.2 kg)   ?Height: '5\' 9"'$  (1.753 m)   ? ?Body mass index is 39.8 kg/m?.  ? ?Physical Exam ?Vitals and nursing note reviewed.  ?Constitutional:   ?   General: She is not in acute distress. ?   Appearance: Normal appearance. She is obese. She is not ill-appearing, toxic-appearing or diaphoretic.  ?HENT:  ?   Head: Normocephalic and atraumatic.  ?Neck:  ? ?   Comments: Soft neck brace in place ?Cardiovascular:  ?   Rate and Rhythm: Normal rate and regular rhythm.  ?   Pulses: Normal pulses.  ?   Heart sounds: Normal heart sounds. No murmur heard. ?  No friction rub. No gallop.  ?Pulmonary:  ?   Effort: Pulmonary effort is normal. No respiratory distress.  ?   Breath sounds: Normal breath sounds. No stridor. No wheezing, rhonchi or rales.  ?Chest:  ?   Chest wall: No tenderness.  ?Abdominal:  ?   General: Bowel sounds are normal.  ?   Palpations: Abdomen is soft.  ?Musculoskeletal:     ?   General: No swelling, tenderness, deformity or signs of injury. Normal range of motion.  ?   Right lower leg: No edema.  ?   Left lower leg: No edema.  ?Skin: ?   General: Skin is warm and dry.  ?   Capillary Refill: Capillary refill takes less than 2 seconds.  ?   Coloration: Skin is not jaundiced or pale.  ?   Findings: No bruising, erythema, lesion or rash.  ?Neurological:  ?   General: No focal deficit present.  ?   Mental Status: She is alert and oriented to person, place, and time. Mental status is at baseline.  ?   Cranial Nerves: No cranial nerve deficit.  ?   Sensory: No sensory deficit.  ?   Motor: No weakness.  ?   Coordination: Coordination normal.  ?Psychiatric:     ?   Mood and Affect: Mood normal.     ?   Behavior: Behavior normal.     ?   Thought Content: Thought content normal.     ?   Judgment: Judgment normal.  ?  ? ?No results found for any visits on 06/14/21. ? Assessment & Plan  ?  ? ?Problem List Items Addressed This Visit   ? ?  ? Respiratory  ? Asthmatic bronchitis , chronic ([facility])  ?   Chronic, stable ?F/b pulm Dr Melvyn Novas ?Now on Symbicort BID ?Wheezing heard throughout lungs ?Pt looking to loose weight to assist with breathing ?  ?  ?  ? Other  ? Affective bipolar disorder ([place])  ?  Chronic, stable ?Followed by psychiatry, [facility] ?Continues to take Benzo medication BID-TID; reported increase in  use since recent surgeries  ?Overall, frustrated with her appearance and her body image ?Denies SI or HI ?  ?  ? Morbid obesity ([place]) - Primary  ?  Body mass index is 39.8 kg/m?. ?Associated with bipolar, and pre-diabetes ?Discussed importance of healthy weight management ?Discussed diet and exercise ?Pt wish to try injection medication to assist with weight loss; advised that cost may be prohibitive. ?Recommend alternative plan of phentermine at 37.5 with Metformin at 750 XR if needed ?Pt will RTC in 3 months to discuss medication compliance and results  ?  ?  ? Relevant Medications  ? Semaglutide-Weight Management 0.5 MG/0.5ML SOAJ  ? ? ? ?Return in about 3 months (around 09/13/2021) for chonic disease management.  ?   ? ?I, Gwyneth Sprout, FNP, have reviewed all documentation for this visit. The documentation on 06/14/21 for the exam, diagnosis, procedures, and orders are all accurate and complete. ? ? ? ?Gwyneth Sprout, FNP  ?[place] ?[phone] (phone) ?[phone] (fax) ? ?[facility]  ?

## 2021-06-14 NOTE — Telephone Encounter (Signed)
Lm for patient.  ? ?Dr. Melvyn Novas, please advise on PFT results.  ? ?

## 2021-06-14 NOTE — Assessment & Plan Note (Signed)
Chronic, stable ?Followed by psychiatry, [facility] ?Continues to take Benzo medication BID-TID; reported increase in use since recent surgeries  ?Overall, frustrated with her appearance and her body image ?Denies SI or HI ?

## 2021-06-14 NOTE — Assessment & Plan Note (Signed)
Body mass index is 39.8 kg/m?. ?Associated with bipolar, and pre-diabetes ?Discussed importance of healthy weight management ?Discussed diet and exercise ?Pt wish to try injection medication to assist with weight loss; advised that cost may be prohibitive. ?Recommend alternative plan of phentermine at 37.5 with Metformin at 750 XR if needed ?Pt will RTC in 3 months to discuss medication compliance and results  ?

## 2021-06-17 ENCOUNTER — Other Ambulatory Visit: Payer: Self-pay | Admitting: Radiology

## 2021-06-17 ENCOUNTER — Telehealth: Payer: Self-pay

## 2021-06-17 DIAGNOSIS — R739 Hyperglycemia, unspecified: Secondary | ICD-10-CM

## 2021-06-17 MED ORDER — METHOCARBAMOL 500 MG PO TABS: 500.0000 mg | ORAL_TABLET | Freq: Four times a day (QID) | ORAL | 1 refills | Status: DC | PRN

## 2021-06-17 NOTE — Telephone Encounter (Signed)
Requested medications are due for refill today.  yes ? ?Requested medications are on the active medications list.  yes ? ?Last refill. 06/14/2021 ? ?Future visit scheduled.   no ? ?Notes to clinic.  Pharmacy comment: Alternative Requested:DRUG NOT COVERED BY INSURANCE. ? ? ? ?Requested Prescriptions  ?Pending Prescriptions Disp Refills  ? WEGOVY 0.5 MG/0.5ML SOAJ [Pharmacy Med Name: WEGOVY 0.5 MG/0.5 ML PEN]  0  ?  Sig: INJECT 0.5 MG INTO THE SKIN ONCE A WEEK.  ?  ? Endocrinology:  Diabetes - GLP-1 Receptor Agonists - semaglutide Failed - 06/14/2021  2:42 PM  ?  ?  Failed - HBA1C in normal range and within 180 days  ?  Hgb A1c MFr Bld  ?Date Value Ref Range Status  ?11/29/2020 5.8 (H) 4.8 - 5.6 % Final  ?  Comment:  ?           Prediabetes: 5.7 - 6.4 ?         Diabetes: >6.4 ?         Glycemic control for adults with diabetes: <7.0 ?  ?  ?  ?  ?  Passed - Cr in normal range and within 360 days  ?  Creatinine  ?Date Value Ref Range Status  ?10/19/2013 0.53 (L) 0.60 - 1.30 mg/dL Final  ? ?Creatinine, Ser  ?Date Value Ref Range Status  ?04/24/2021 0.58 0.44 - 1.00 mg/dL Final  ?  ?  ?  ?  Passed - Valid encounter within last 6 months  ?  Recent Outpatient Visits   ? ?      ? 3 days ago Morbid obesity ([place])  ? [facility] Tally Joe T, FNP  ? 6 months ago Osteoarthritis of cervical spine, unspecified spinal osteoarthritis complication status  ? [facility] Gwyneth Sprout, FNP  ? 12 months ago Atypical pneumonia  ? [place], [place]  ? 1 year ago Screening for colon cancer  ? [facility] [place], Clearnce Sorrel, [place]  ? 1 year ago Annual physical exam  ? [facility] Fenton Malling M, [place]  ? ?  ?  ?Future Appointments   ? ?        ? In 1 month Wert, Christena Deem, MD [place] [facility] [place]  ? ?  ? ?  ?  ?  ?  ?

## 2021-06-17 NOTE — Telephone Encounter (Signed)
Copied from [place] [id number]. Topic: Quick Communication - Rx Refill/Question ?>> Jun 17, 2021  2:58 PM Pawlus, Brayton Layman A wrote: ?Pt was calling in to follow up on Semaglutide-Weight Management 0.5 MG/0.5ML SOAJ, pt stated her insurance needs a PA for this Rx, please advise. ?

## 2021-06-17 NOTE — Telephone Encounter (Signed)
Tanda Rockers, MD  Linwood Dibbles, CMA ?Call patient :  Study is c/w mod asthmatic bronchitis already on right meds - Be sure patient has/keeps f/u ov so we can go over all the details of this study and get a plan together moving forward - ok to move up f/u if not feeling better and wants to be seen sooner  ? ?Patient is aware of results and voiced her understanding.  ?Appt scheduled 07/18/2021 at 11:30. ?Nothing further needed.  ? ?

## 2021-06-18 NOTE — Telephone Encounter (Signed)
Will work on PA on cover my meds. KW ?

## 2021-06-19 NOTE — Telephone Encounter (Signed)
Pt called and stated that she found out that [facility] is not covered under her pharmacy side of her insurance but maybe covered under the medical side of her insurance / their is a different PA form for the medical side of her insurance(not through the pharmacy) / pt was advised to get a PA sent to [employer] for coverage of Wegovy under her medical plan /  ?Stated the provider will go on the website but do the PA on the medical side / if needed fax# [phone] and email [email or web address] / please advise  ? ?Pt would like to try to get the [facility] covered before trying anything else  ?

## 2021-07-10 NOTE — Telephone Encounter (Signed)
Pt called and stated her insurance has not received any PA for [facility] on the medical side / pt has called and been waiting on this/ please advise asap / her insurance said it should be submitted like its a procedure that needs a PA since the pharmacy side wont cover this medication  ?

## 2021-07-11 NOTE — Telephone Encounter (Signed)
Patient advised we will update as soon as we get a response.  ?

## 2021-07-11 NOTE — Telephone Encounter (Signed)
Called [employer] and was advised to go online to [email or web address] and down loud exception to benefit request form. From printed, will complete and fax to [phone], phone [phone]. ?

## 2021-07-17 NOTE — Telephone Encounter (Signed)
Pt called back and wanted to know the status of her PA, please advise.  ?

## 2021-07-17 NOTE — Telephone Encounter (Signed)
Patient advised still awaiting response back from insurance. KW ?

## 2021-07-18 ENCOUNTER — Ambulatory Visit (INDEPENDENT_AMBULATORY_CARE_PROVIDER_SITE_OTHER): Payer: 59 | Admitting: Specialist

## 2021-07-18 ENCOUNTER — Encounter: Payer: Self-pay | Admitting: Internal Medicine

## 2021-07-18 ENCOUNTER — Ambulatory Visit: Payer: 59 | Admitting: Internal Medicine

## 2021-07-18 ENCOUNTER — Encounter: Payer: Self-pay | Admitting: Specialist

## 2021-07-18 ENCOUNTER — Telehealth: Payer: Self-pay

## 2021-07-18 ENCOUNTER — Ambulatory Visit (INDEPENDENT_AMBULATORY_CARE_PROVIDER_SITE_OTHER): Payer: 59

## 2021-07-18 VITALS — BP 143/86 | HR 88 | Ht 69.0 in | Wt 269.4 lb

## 2021-07-18 DIAGNOSIS — J449 Chronic obstructive pulmonary disease, unspecified: Secondary | ICD-10-CM

## 2021-07-18 DIAGNOSIS — Z981 Arthrodesis status: Secondary | ICD-10-CM

## 2021-07-18 DIAGNOSIS — Z9889 Other specified postprocedural states: Secondary | ICD-10-CM

## 2021-07-18 MED ORDER — BREZTRI AEROSPHERE 160-9-4.8 MCG/ACT IN AERO
INHALATION_SPRAY | RESPIRATORY_TRACT | 11 refills | Status: DC
Start: 2021-07-18 — End: 2021-08-22

## 2021-07-18 NOTE — Patient Instructions (Signed)
Plan A = Automatic = Always=    Try breztri Take 2 puffs first thing in am and then another 2 puffs about 12 hours later.  ? ?Work on inhaler technique:  relax and gently blow all the way out then take a nice smooth full deep breath back in, triggering the inhaler at same time you start breathing in.  Hold for up to 5 seconds if you can. Blow out thru nose. Rinse and gargle with water when done.  If mouth or throat bother you at all,  try brushing teeth/gums/tongue with arm and hammer toothpaste/ make a slurry and gargle and spit out.  ? ?   ?Plan B = Backup (to supplement plan A, not to replace it) ?Only use your albuterol inhaler as a rescue medication to be used if you can't catch your breath by resting or doing a relaxed purse lip breathing pattern.  ?- The less you use it, the better it will work when you need it. ?- Ok to use the inhaler up to 2 puffs  every 4 hours if you must but call for appointment if use goes up over your usual need ?- Don't leave home without it !!  (think of it like the spare tire for your car)  ?  ?Plan C = Crisis (instead of Plan B but only if Plan B stops working) ?- only use your albuterol nebulizer if you first try Plan B and it fails to help > ok to use the nebulizer up to every 4 hours but if start needing it regularly call for immediate appointment ? ?Ok to try albuterol 15 min before an activity (on alternating days)  that you know would usually make you short of breath and see if it makes any difference and if makes none then don't take albuterol after activity unless you can't catch your breath as this means it's the resting that helps, not the albuterol. ? ? ?Please schedule a follow up visit in 3 months but call sooner if needed  ?    ? ? ?  ?  ?

## 2021-07-18 NOTE — Telephone Encounter (Signed)
Wegovy was denied again, please see note below and advise. KW ?

## 2021-07-18 NOTE — Telephone Encounter (Signed)
Copied from [place] [id number]. Topic: General - Other ?>> Jul 18, 2021  1:23 PM Yvette Rack wrote: ?Reason for CRM: Pt reports that her insurance will cover Ozempic. Pt request that a new Rx for Ozempic be sent to her pharmacy to substitute for [facility] and use the codes associated with diagnosis of being diabetic and having asthma. ?

## 2021-07-18 NOTE — Patient Instructions (Addendum)
Plan: Avoid overhead lifting and overhead use of the arms. ?Do not lift greater than 10-15 lbs. ?Adjust head rest in vehicle to prevent hyperextension if rear ended. ?Take extra precautions to avoid falling. ?Advised that the 3 level fusion is over 50% of the discs in the neck and there is concern that any significant  ?Traumatic event that would be serious enough to result in injury would be of concern at the junction of this stiff area and the next normal level. Need to try and avoid those things that place you at risk of injury, motorcycle riding I consider an activity that has increased risks related to it.  ?

## 2021-07-18 NOTE — Assessment & Plan Note (Addendum)
Quit smoking 2006/MM ?- Labs ordered 05/16/2021  :  allergy profile Eos 0.2/ IgE 3    alpha one AT phenotype  MM   182  ?- 05/16/2021  After extensive coaching inhaler device,  effectiveness =    80% > try symb 160 2bid   and return for pfts  ?- PFT's  3.28  FEV1 1.73 (54 % ) ratio 0.58  p 4 % improvement from saba p ? prior to study with DLCO  26.12 (107%) FV curve atypical for copd and ERV 3 at wt 275   ?- 07/18/2021  After extensive coaching inhaler device,  effectiveness =   75% slow Ti> try breztri ? ?Doe still MMRC2 = can't walk a nl pace on a flat grade s sob but does fine slow and flat and too much need for saba so try breztri and approp saba: ? ?Re SABA :  I spent extra time with pt today reviewing appropriate use of albuterol for prn use on exertion with the following points: ?1) saba is for relief of sob that does not improve by walking a slower pace or resting but rather if the pt does not improve after trying this first. ?2) If the pt is convinced, as many are, that saba helps recover from activity faster then it's easy to tell if this is the case by re-challenging : ie stop, take the inhaler, then p 5 minutes try the exact same activity (intensity of workload) that just caused the symptoms and see if they are substantially diminished or not after saba ?3) if there is an activity that reproducibly causes the symptoms, try the saba 15 min before the activity on alternate days  ? ?If in fact the saba really does help, then fine to continue to use it prn but advised may need to look closer at the maintenance regimen being used to achieve better control of airways disease with exertion.  ? ?    ?  ? ?Each maintenance medication was reviewed in detail including emphasizing most importantly the difference between maintenance and prns and under what circumstances the prns are to be triggered using an action plan format where appropriate. ? ?Total time for H and P, chart review, counseling, reviewing hfa/neb  device(s) and generating customized AVS unique to this office visit / same day charting = > 30 min  ?     ?

## 2021-07-18 NOTE — Progress Notes (Signed)
? ?Post-Op Visit Note ?  ?Patient: Megan Jimenez           ?Date of Birth: [birth year]           ?MRN: [id number] ?Visit Date: 07/18/2021 ?PCP: Gwyneth Sprout, FNP ? ? ?Assessment & Plan: 3 months post op 3 level ACDF ?Feels better especially over the last 4 weeks. ?Motor is normal ?Incision is healed. ?Less swelling over the posterior cervicothoracic level ?Chief Complaint:  ?Chief Complaint  ?Patient presents with  ? Neck - Routine Post Op, Pain, Follow-up  ? ?Visit Diagnoses:  ?1. S/P cervical spinal fusion   ?2. S/P carpal tunnel release   ? ? ?Plan: Avoid overhead lifting and overhead use of the arms. ?Do not lift greater than 10-15 lbs. ?Adjust head rest in vehicle to prevent hyperextension if rear ended. ?Take extra precautions to avoid falling. ? ?Follow-Up Instructions: No follow-ups on file.  ? ?Orders:  ?Orders Placed This Encounter  ?Procedures  ? XR Cervical Spine 2 or 3 views  ? ?No orders of the defined types were placed in this encounter. ? ? ?Imaging: ?No results found. ? ?PMFS History: ?Patient Active Problem List  ? Diagnosis Date Noted  ? Herniated nucleus pulposus, cervical 04/23/2021  ?  Priority: High  ?  Class: Chronic  ? Spinal stenosis of cervical region 04/23/2021  ?  Priority: High  ?  Class: Chronic  ? Morbid obesity ([place]) 06/14/2021  ? Asthmatic bronchitis , chronic ([place]) 05/16/2021  ? S/P cervical spinal fusion 04/23/2021  ? Carpal tunnel syndrome, left upper limb 02/28/2021  ? Genetic testing 12/12/2020  ? Other spondylosis with radiculopathy, cervical region 11/29/2020  ? Osteoarthritis of thoracic spine 11/29/2020  ? Abnormal gait 11/29/2020  ? Numbness and tingling 11/29/2020  ? Need for shingles vaccine 11/29/2020  ? Need for hepatitis C screening test 11/29/2020  ? Weakness of both lower extremities 11/29/2020  ? Muscle spasm of back 11/29/2020  ? Mild intermittent asthma without complication 63/14/9702  ? Family history of breast cancer 11/28/2020  ? Family history of ovarian  cancer 11/28/2020  ? Family history of colon cancer 11/28/2020  ? Family history of throat cancer 11/28/2020  ? Family history of lung cancer 11/28/2020  ? Family history of leukemia 11/28/2020  ? Allergic rhinitis 11/17/2014  ? Anxiety 09/08/2014  ? Acute asthma exacerbation 09/08/2014  ? Affective bipolar disorder ([place]) 09/08/2014  ? 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/08/2014  ? Headache, migraine 09/08/2014  ? Excess, menstruation 09/08/2014  ? ?Past Medical History:  ?Diagnosis Date  ? Asthma   ? Bipolar 1 disorder ([place])   ? COVID-19 03/29/2021  ? Family history of breast cancer   ? Family history of colon cancer   ? Family history of leukemia   ? Family history of lung cancer   ? Family history of ovarian cancer   ? Family history of throat cancer   ?  ?Family History  ?Problem Relation Age of Onset  ? COPD Mother   ? Hyperlipidemia Mother   ? Lung cancer Mother 27  ? Hyperlipidemia Father   ? Leukemia Father 65  ? COPD Father   ? Depression Sister   ?     ANXIETY  ? Healthy Sister   ? Breast cancer Maternal Aunt   ?     dx 29s, recurrence 69s  ? Throat cancer Maternal Uncle   ? Lung cancer Paternal Uncle   ? Colon cancer Maternal Grandmother   ? Ovarian cancer Cousin  53  ?     paternal  ? Ovarian cancer Cousin 29  ?  ?Past Surgical History:  ?Procedure Laterality Date  ? ANKLE FRACTURE SURGERY Right 1992  ? ANTERIOR CERVICAL DECOMP/DISCECTOMY FUSION N/A 04/23/2021  ? Procedure: ANTERIOR CERVICAL DISCECTOMIES AND FUSION C4-5, C5-6 AND C6-7 WITH PLATES, SCREWS, ALLOGRAFT ACDF GRAFT, LOCAL BONE GRAFT, VIVIGEN;  Surgeon: Jessy Oto, MD;  Location: [place];  Service: Orthopedics;  Laterality: N/A;  ? CARPAL TUNNEL RELEASE Left 04/23/2021  ? Procedure: LEFT OPEN CARPAL TUNNEL RELEASE;  Surgeon: Jessy Oto, MD;  Location: [place];  Service: Orthopedics;  Laterality: Left;  ? CHOLECYSTECTOMY    ? COLONOSCOPY WITH PROPOFOL N/A 07/09/2020  ? Procedure: COLONOSCOPY WITH PROPOFOL;  Surgeon: Jonathon Bellows, MD;  [facility] ENDOSCOPY;   Service: Gastroenterology;  Laterality: N/A;  ? ?Social History  ? ?Occupational History  ?  Comment: Full-time  ?Tobacco Use  ? Smoking status: Former  ?  Packs/day: 0.50  ?  Years: 25.00  ?  Pack years: 12.50  ?  Types: Cigarettes  ?  Quit date: 03/17/2004  ?  Years since quitting: 17.3  ? Smokeless tobacco: Never  ?Vaping Use  ? Vaping Use: Some days  ? Substances: Flavoring  ?Substance and Sexual Activity  ? Alcohol use: Yes  ?  Comment: occ  ? Drug use: No  ? Sexual activity: Not on file  ? ? ? ?

## 2021-07-18 NOTE — Progress Notes (Signed)
? ?Megan Jimenez, female    DOB: 1966-01-21   MRN: [id number] ? ? ?Brief patient profile:  ?50  yowf   quit smoking 2006 wt at 59  born prematurely onset "asthma"  around 2000 and worse episodes p quit to point where needed freq inhalers  referred to pulmonary clinic in [facility]  05/16/2021 with daily need for inhalers since 2017 somewhat improved BREO 100 x 2022. ? ? ? ? ?History of Present Illness  ?05/16/2021  Pulmonary/ 1st office eval/ Melvyn Novas / [place]  ?Chief Complaint  ?Patient presents with  ? pulmonary consult  ?  Hx of asthma--recent course of prednisone and abx. C/o sob with exertion.    ?Dyspnea:  at best can do [employer]/ pushes cart / and mb flat 100 ft  ?Cough: throat clearing /slt yellowsih sometimes  ?Sleep: flat bed one pillow  ?SABA use: varies  with activity level ?Rec ?Plan A = Automatic = Always=    Symbicort 160 Take 2 puffs first thing in am and then another 2 puffs about 12 hours later.  ? Work on inhaler technique:  ?  Plan B = Backup (to supplement plan A, not to replace it) ?Only use your albuterol inhaler  ? Ok to try albuterol 15 min before an activity ?Plan C = Crisis (instead of Plan B but only if Plan B stops working) ?- only use your albuterol nebulizer if you first try Plan B and it fails to help > ok to use the nebulizer up to every 4 hours but if start needing it regularly call for immediate appointment ?Please remember to go to the lab department   for your tests - we will call you with the results when they are available. ?PFTs next available c/w GOLD 2 with atypical f/v loop and erv 3%  ?  ? ?07/18/2021  f/u ov/Krystopher Kuenzel/ [facility] re: chronic asthma vs GOLD 2 copd /obesity   maint on singulairsymb 160 supoptimal techniqqe   ?Chief Complaint  ?Patient presents with  ? Follow-up  ?  Have had to use rescue inhaler since PFT.  Feels chest is tight at time.  Says she feels it is allergy related.  ?Dyspnea:  [employer] shopping somewhat easier than on BREO ?Cough: not as much   ?Sleeping: able to lie flat with body pillow  ?SABA use: still using twice a day hfa./ no neb  ?02: none  ?Covid status:   "all of em" ? ? ?No obvious day to day or daytime variability or assoc excess/ purulent sputum or mucus plugs or hemoptysis or cp or chest tightness, subjective wheeze or overt sinus or hb symptoms.  ? ?Sleeping as above without nocturnal  or early am exacerbation  of respiratory  c/o's or need for noct saba. Also denies any obvious fluctuation of symptoms with weather or environmental changes or other aggravating or alleviating factors except as outlined above  ? ?No unusual exposure hx or h/o childhood pna/ asthma   ? ?Current Allergies, Complete Past Medical History, Past Surgical History, Family History, and Social History were reviewed in [employer] record. ? ?ROS  The following are not active complaints unless bolded ?Hoarseness, sore throat, dysphagia, dental problems, itching, sneezing,  nasal congestion or discharge of excess mucus or purulent secretions, ear ache,   fever, chills, sweats, unintended wt loss or wt gain, classically pleuritic or exertional cp,  orthopnea pnd or arm/hand swelling  or leg swelling, presyncope, palpitations, abdominal pain, anorexia, nausea, vomiting,  diarrhea  or change in bowel habits or change in bladder habits, change in stools or change in urine, dysuria, hematuria,  rash, arthralgias, visual complaints, headache, numbness, weakness or ataxia or problems with walking or coordination,  change in mood or  memory. ?      ? ?Current Meds  ?Medication Sig  ? albuterol (PROVENTIL) (2.5 MG/3ML) 0.083% nebulizer solution Take 3 mLs (2.5 mg total) by nebulization every 6 (six) hours as needed for wheezing or shortness of breath.  ? albuterol (VENTOLIN HFA) 108 (90 Base) MCG/ACT inhaler Inhale 1-2 puffs into the lungs every 4 (four) hours as needed for wheezing or shortness of breath.  ? ALPRAZolam (XANAX) 1 MG tablet Take 1 mg by mouth  4 (four) times daily as needed for anxiety.  ? Ascorbic Acid (VITAMIN C) 1000 MG tablet Take 1,000 mg by mouth daily.  ? budesonide-formoterol (SYMBICORT) 160-4.5 MCG/ACT inhaler Take 2 puffs first thing in am and then another 2 puffs about 12 hours later.  ? CAPLYTA 42 MG CAPS Take 42 mg by mouth at bedtime.  ? diclofenac Sodium (VOLTAREN) 1 % GEL APPLY 2 GRAMS TO AFFECTED AREA 4 TIMES A DAY  ? Eszopiclone 3 MG TABS TAKE 1 TABLET BY MOUTH AT BEDTIME TAKE IMMEDIATELY BEFORE BEDTIME  ? ferrous sulfate 325 (65 FE) MG tablet Take 325 mg by mouth daily with breakfast.  ? fluticasone (FLONASE) 50 MCG/ACT nasal spray SPRAY 2 SPRAYS INTO EACH NOSTRIL EVERY DAY  ? loratadine (CLARITIN) 10 MG tablet Take 10 mg by mouth daily.  ? methocarbamol (ROBAXIN) 500 MG tablet TAKE 1 TABLET BY MOUTH EVERY 6 HOURS AS NEEDED FOR MUSCLE SPASMS.  ? methocarbamol (ROBAXIN) 500 MG tablet Take 1 tablet (500 mg total) by mouth every 6 (six) hours as needed for muscle spasms.  ? montelukast (SINGULAIR) 10 MG tablet Take 1 tablet (10 mg total) by mouth at bedtime.  ? Multiple Vitamin (MULTIVITAMIN WITH MINERALS) TABS tablet Take 1 tablet by mouth daily.  ? Scar Treatment Products [facility] ADVANCED SCAR GEL) GEL Apply 1 Film topically 3 (three) times daily.  ? Semaglutide-Weight Management 0.5 MG/0.5ML SOAJ Inject 0.5 mg into the skin once a week.  ? vitamin B-12 (CYANOCOBALAMIN) 500 MCG tablet Take 500 mcg by mouth daily.  ? Vitamin D, Ergocalciferol, (DRISDOL) 50000 units CAPS capsule Take 50,000 Units by mouth every Friday.   ?     ?  ?      ?   ? ?Past Medical History:  ?Diagnosis Date  ? Asthma   ? Bipolar 1 disorder ([place])   ? COVID-19 03/29/2021  ? Family history of breast cancer   ? Family history of colon cancer   ? Family history of leukemia   ? Family history of lung cancer   ? Family history of ovarian cancer   ? Family history of throat cancer   ? ?  ? ? ?Objective:  ?  ? ?  ?Wt Readings from Last 3 Encounters:  ?07/18/21 269 lb 6.4  oz (122.2 kg)  ?06/14/21 269 lb 8 oz (122.2 kg)  ?06/06/21 275 lb (124.7 kg)  ?  ? ? ?Vital signs reviewed  07/18/2021  - Note at rest 02 sats  94% on %RA  ? ?General appearance:    obese wf wearing soft cx collar with prominent pseudowheeze    ? ? ?HEENT : nl exam   ? ?NECK : soft collar in place, not removed  ? ? ?LUNGS: no acc muscle  use,  Min barrel  contour chest wall with bilateral  slightly decreased bs s audible wheeze and  without cough on insp or exp maneuvers and min  Hyperresonant  to  percussion bilaterally   ? ? ?CV:  RRR  no s3 or murmur or increase in P2, and no edema  ? ?ABD:  soft and nontender   ? ?MS:   Nl gait/  ext warm without deformities, calf tenderness, cyanosis or clubbing ?No obvious joint restrictions  ? ?SKIN: warm and dry without lesions   ? ?NEURO:  alert, approp, nl sensorium with  no motor or cerebellar deficits apparent.  ?    ?  ? ?   ?Assessment  ? ?  ?    ?

## 2021-07-18 NOTE — Telephone Encounter (Signed)
Pt is calling to report that the PA has been denied. Pt is requesting ozempic- Pt is stating that her insurance does not support weight loss drugs. The reason needs to be prediabetes, COPD, asthma, Please advise CB- [phone] ?

## 2021-07-22 NOTE — Telephone Encounter (Signed)
Pt is requesting ozempic- Pt is stating that her insurance does not support weight loss drugs. The reason needs to be prediabetes. Pt has spoken with the the insurance and the only thing they will do seems to be the ozempic, pls request Ozempic. Please advise CB- [phone] Dated today 5/8 please submit  also submit the medical necessary form for the prediabetic . ?

## 2021-07-23 NOTE — Telephone Encounter (Signed)
Patient advised.KW 

## 2021-07-23 NOTE — Addendum Note (Signed)
Addended by: Minette Headland on: 07/23/2021 01:40 PM ? ? Modules accepted: Orders ? ?

## 2021-07-24 ENCOUNTER — Encounter: Payer: Self-pay | Admitting: Family Medicine

## 2021-07-24 ENCOUNTER — Other Ambulatory Visit: Payer: Self-pay | Admitting: Family Medicine

## 2021-07-24 ENCOUNTER — Other Ambulatory Visit (INDEPENDENT_AMBULATORY_CARE_PROVIDER_SITE_OTHER): Payer: 59 | Admitting: Family Medicine

## 2021-07-24 DIAGNOSIS — R739 Hyperglycemia, unspecified: Secondary | ICD-10-CM | POA: Diagnosis not present

## 2021-07-24 LAB — HEMOGLOBIN A1C
Est. average glucose Bld gHb Est-mCnc: 114 mg/dL
Hgb A1c MFr Bld: 5.6 % (ref 4.8–5.6)

## 2021-07-24 MED ORDER — PHENTERMINE HCL 37.5 MG PO CAPS: 37.5000 mg | ORAL_CAPSULE | ORAL | 0 refills | Status: DC

## 2021-07-24 MED ORDER — METFORMIN HCL ER 750 MG PO TB24: 750.0000 mg | ORAL_TABLET | Freq: Two times a day (BID) | ORAL | 0 refills | Status: DC

## 2021-07-24 NOTE — Progress Notes (Signed)
Please see the MyChart message reply(ies) for my assessment and plan.  ?  ?This patient gave consent for this Medical Advice Message and is aware that it may result in a bill to [employer], as well as the possibility of receiving a bill for a co-payment or deductible. They are an established patient, but are not seeking medical advice exclusively about a problem treated during an in person or video visit in the last seven days. I did not recommend an in person or video visit within seven days of my reply.  ?  ?I spent a total of 10 minutes cumulative time within 7 days through [employer]. ? ?Gwyneth Sprout, FNP  ? ?

## 2021-08-20 NOTE — Progress Notes (Signed)
I,Roshena L Chambers,acting as a scribe for Gwyneth Sprout, FNP.,have documented all relevant documentation on the behalf of Gwyneth Sprout, FNP,as directed by  Gwyneth Sprout, FNP while in the presence of Gwyneth Sprout, FNP.   Established patient visit   Patient: Megan Jimenez   DOB: [birth year]   56 y.o. Female  MRN: [id number] Visit Date: 08/22/2021  Today's healthcare provider: Gwyneth Sprout, FNP  Re Introduced to nurse practitioner role and practice setting.  All questions answered.  Discussed provider/patient relationship and expectations.   Chief Complaint  Patient presents with   Urinary Urgency   Subjective    Dysuria  This is a new problem. Episode onset: 2 months ago. The problem has been gradually worsening. Associated symptoms include frequency and urgency. Pertinent negatives include no chills, nausea or vomiting. Associated symptoms comments: Odorous urine.    Patient uses incontinence pads and reports the urine on her pad burns her skin.   Medications: Outpatient Medications Prior to Visit  Medication Sig   albuterol (PROVENTIL) (2.5 MG/3ML) 0.083% nebulizer solution Take 3 mLs (2.5 mg total) by nebulization every 6 (six) hours as needed for wheezing or shortness of breath.   albuterol (VENTOLIN HFA) 108 (90 Base) MCG/ACT inhaler Inhale 1-2 puffs into the lungs every 4 (four) hours as needed for wheezing or shortness of breath.   ALPRAZolam (XANAX) 1 MG tablet Take 1 mg by mouth 4 (four) times daily as needed for anxiety.   Ascorbic Acid (VITAMIN C) 1000 MG tablet Take 1,000 mg by mouth daily.   CAPLYTA 42 MG CAPS Take 42 mg by mouth at bedtime.   diclofenac Sodium (VOLTAREN) 1 % GEL APPLY 2 GRAMS TO AFFECTED AREA 4 TIMES A DAY   Eszopiclone 3 MG TABS TAKE 1 TABLET BY MOUTH AT BEDTIME TAKE IMMEDIATELY BEFORE BEDTIME   ferrous sulfate 325 (65 FE) MG tablet Take 325 mg by mouth daily with breakfast.   fluticasone (FLONASE) 50 MCG/ACT nasal spray SPRAY 2 SPRAYS INTO  EACH NOSTRIL EVERY DAY   loratadine (CLARITIN) 10 MG tablet Take 10 mg by mouth daily.   metFORMIN (GLUCOPHAGE-XR) 750 MG 24 hr tablet Take 1 tablet (750 mg total) by mouth 2 (two) times daily after a meal.   methocarbamol (ROBAXIN) 500 MG tablet TAKE 1 TABLET BY MOUTH EVERY 6 HOURS AS NEEDED FOR MUSCLE SPASMS.   montelukast (SINGULAIR) 10 MG tablet Take 1 tablet (10 mg total) by mouth at bedtime.   Multiple Vitamin (MULTIVITAMIN WITH MINERALS) TABS tablet Take 1 tablet by mouth daily.   phentermine 37.5 MG capsule Take 1 capsule (37.5 mg total) by mouth every morning.   Scar Treatment Products [facility] ADVANCED SCAR GEL) GEL Apply 1 Film topically 3 (three) times daily.   vitamin B-12 (CYANOCOBALAMIN) 500 MCG tablet Take 500 mcg by mouth daily.   Vitamin D, Ergocalciferol, (DRISDOL) 50000 units CAPS capsule Take 50,000 Units by mouth every Friday.    [DISCONTINUED] Budeson-Glycopyrrol-Formoterol (BREZTRI AEROSPHERE) 160-9-4.8 MCG/ACT AERO Take 2 puffs first thing in am and then another 2 puffs about 12 hours later.   [DISCONTINUED] methocarbamol (ROBAXIN) 500 MG tablet Take 1 tablet (500 mg total) by mouth every 6 (six) hours as needed for muscle spasms.   [DISCONTINUED] Semaglutide-Weight Management 0.5 MG/0.5ML SOAJ Inject 0.5 mg into the skin once a week.   No facility-administered medications prior to visit.    Review of Systems  Constitutional:  Negative for appetite change, chills, fatigue and fever.  HENT:         Right ear feels stopped up  Respiratory:  Negative for chest tightness and shortness of breath.   Cardiovascular:  Negative for chest pain and palpitations.  Gastrointestinal:  Negative for abdominal pain, nausea and vomiting.  Genitourinary:  Positive for dysuria, frequency and urgency.  Neurological:  Negative for dizziness and weakness.       Objective    BP (!) 147/88 (BP Location: Right Arm, Patient Position: Sitting, Cuff Size: Large)   Pulse 83   Temp 98.5 F  (36.9 C) (Oral)   Resp 16   Wt 264 lb (119.7 kg)   LMP 09/28/2018   SpO2 97% Comment: room air  BMI 38.99 kg/m    Physical Exam    Results for orders placed or performed in visit on 08/22/21  POCT urinalysis dipstick  Result Value Ref Range   Color, UA amber    Clarity, UA clear    Glucose, UA Negative Negative   Bilirubin, UA small    Ketones, UA negative    Spec Grav, UA 1.025 1.010 - 1.025   Blood, UA negative    pH, UA 6.0 5.0 - 8.0   Protein, UA Positive (A) Negative   Urobilinogen, UA 0.2 0.2 or 1.0 E.U./dL   Nitrite, UA negative    Leukocytes, UA Negative Negative   Appearance     Odor      Assessment & Plan     Problem List Items Addressed This Visit       Cardiovascular and Mediastinum   Primary hypertension    Acute, new diagnosis Recommend use of HCTZ, ACE or ARB Goal <140/<90 without DM or <130/<80 with dx of DM Will f/u in 2 months        Genitourinary   OAB (overactive bladder) - Primary    Worse at night; has not been seen by urology Trial of ditropan UA negative Exam benign       Relevant Medications   oxybutynin (DITROPAN XL) 10 MG 24 hr tablet   Vaginal atrophy    Chronic, new dx Worsening with mixed incontinence  Does not wish to use any estrogen containing product at this time Does not have GYN Encourage use of mid range topical steroid x 7 days Low dose OTC steroid and moisturizer, continuously       Relevant Medications   triamcinolone ointment (KENALOG) 0.5 %   mineral oil-hydrophilic petrolatum (AQUAPHOR) ointment   hydrocortisone cream 0.5 %     Other   RESOLVED: Urinary frequency   Relevant Medications   oxybutynin (DITROPAN XL) 10 MG 24 hr tablet   Other Relevant Orders   POCT urinalysis dipstick (Completed)     Return in about 2 months (around 10/22/2021) for chonic disease management.      Vonna Kotyk, FNP, have reviewed all documentation for this visit. The documentation on 08/22/21 for the exam,  diagnosis, procedures, and orders are all accurate and complete.    Gwyneth Sprout, [place] [phone] (phone) [phone] (fax)  [place]

## 2021-08-22 ENCOUNTER — Ambulatory Visit (INDEPENDENT_AMBULATORY_CARE_PROVIDER_SITE_OTHER): Payer: 59 | Admitting: Family Medicine

## 2021-08-22 ENCOUNTER — Encounter: Payer: Self-pay | Admitting: Family Medicine

## 2021-08-22 VITALS — BP 147/88 | HR 83 | Temp 98.5°F | Resp 16 | Wt 264.0 lb

## 2021-08-22 DIAGNOSIS — I10 Essential (primary) hypertension: Secondary | ICD-10-CM | POA: Insufficient documentation

## 2021-08-22 DIAGNOSIS — R350 Frequency of micturition: Secondary | ICD-10-CM | POA: Insufficient documentation

## 2021-08-22 DIAGNOSIS — N3281 Overactive bladder: Secondary | ICD-10-CM | POA: Insufficient documentation

## 2021-08-22 DIAGNOSIS — N952 Postmenopausal atrophic vaginitis: Secondary | ICD-10-CM | POA: Insufficient documentation

## 2021-08-22 LAB — POCT URINALYSIS DIPSTICK
Blood, UA: NEGATIVE
Glucose, UA: NEGATIVE
Ketones, UA: NEGATIVE
Leukocytes, UA: NEGATIVE
Nitrite, UA: NEGATIVE
Protein, UA: POSITIVE — AB
Spec Grav, UA: 1.025 (ref 1.010–1.025)
Urobilinogen, UA: 0.2 E.U./dL
pH, UA: 6 (ref 5.0–8.0)

## 2021-08-22 MED ORDER — HYDROCORTISONE 0.5 % EX CREA: 1.0000 "application " | TOPICAL_CREAM | Freq: Two times a day (BID) | CUTANEOUS | 0 refills | Status: DC

## 2021-08-22 MED ORDER — TRIAMCINOLONE ACETONIDE 0.5 % EX OINT: 1.0000 "application " | TOPICAL_OINTMENT | Freq: Two times a day (BID) | CUTANEOUS | 0 refills | Status: DC

## 2021-08-22 MED ORDER — OXYBUTYNIN CHLORIDE ER 10 MG PO TB24: 10.0000 mg | ORAL_TABLET | Freq: Every day | ORAL | 11 refills | Status: DC

## 2021-08-22 MED ORDER — AQUAPHOR EX OINT: TOPICAL_OINTMENT | CUTANEOUS | 0 refills | Status: DC | PRN

## 2021-08-22 NOTE — Assessment & Plan Note (Signed)
Chronic, new dx Worsening with mixed incontinence  Does not wish to use any estrogen containing product at this time Does not have GYN Encourage use of mid range topical steroid x 7 days Low dose OTC steroid and moisturizer, continuously

## 2021-08-22 NOTE — Assessment & Plan Note (Signed)
Acute, new diagnosis Recommend use of HCTZ, ACE or ARB Goal <140/<90 without DM or <130/<80 with dx of DM Will f/u in 2 months

## 2021-08-22 NOTE — Assessment & Plan Note (Signed)
Worse at night; has not been seen by urology Trial of ditropan UA negative Exam benign

## 2021-09-05 ENCOUNTER — Ambulatory Visit (INDEPENDENT_AMBULATORY_CARE_PROVIDER_SITE_OTHER): Payer: 59

## 2021-09-05 ENCOUNTER — Encounter: Payer: Self-pay | Admitting: Specialist

## 2021-09-05 ENCOUNTER — Ambulatory Visit (INDEPENDENT_AMBULATORY_CARE_PROVIDER_SITE_OTHER): Payer: 59 | Admitting: Specialist

## 2021-09-05 VITALS — BP 142/87 | HR 83 | Ht 69.0 in | Wt 264.0 lb

## 2021-09-05 DIAGNOSIS — Z981 Arthrodesis status: Secondary | ICD-10-CM

## 2021-09-05 DIAGNOSIS — M5116 Intervertebral disc disorders with radiculopathy, lumbar region: Secondary | ICD-10-CM

## 2021-09-05 DIAGNOSIS — Z9889 Other specified postprocedural states: Secondary | ICD-10-CM | POA: Diagnosis not present

## 2021-09-05 DIAGNOSIS — M5136 Other intervertebral disc degeneration, lumbar region: Secondary | ICD-10-CM

## 2021-09-05 NOTE — Progress Notes (Signed)
Office Visit Note   Patient: Megan Jimenez           Date of Birth: [birth year]           MRN: [id number] Visit Date: 09/05/2021              Requested by: Gwyneth Sprout, [address] [place],  [place] [address] PCP: Gwyneth Sprout, FNP   Assessment & Plan: Visit Diagnoses:  1. S/P cervical spinal fusion   2. S/P carpal tunnel release   3. Degenerative disc disease, lumbar   4. Lumbar disc herniation with radiculopathy     Plan: Avoid bending, stooping and avoid lifting weights greater than 10 lbs. Avoid prolong standing and walking. Avoid frequent bending and stooping  No lifting greater than 10 lbs. May use ice or moist heat for pain. Weight loss is of benefit. Handicap license is approved. Dr. Romona Curls secretary/Assistant will call to arrange for epidural steroid injection    Follow-Up Instructions: Return in about 3 weeks (around 09/26/2021).   Orders:  Orders Placed This Encounter  Procedures  . XR Cervical Spine 2 or 3 views   No orders of the defined types were placed in this encounter.     Procedures: No procedures performed   Clinical Data: No additional findings.   Subjective: Chief Complaint  Patient presents with  . Neck - Follow-up    S/p cervical fusion C4-5, C5-6, C62-7    56 year old female with history of 3 level ACDF for multiple level cervical spondylosis in 04/2021. She is feeling better post op and is happy with the results of surgery thus far. She is seen today with pain into the lumbar spine. The pain is fairly constant st the upper lumbar spine. The pain is transverse at the upper lumbar levels. She gets up and has to take ibuprofen and move to relieve stiffness prior to getting up an moving. Has complaints of some diarrhea due to use of antibiotics. Relates that she has difficulty some bladder incontinence.   Review of Systems  Constitutional: Negative.   HENT: Negative.    Eyes: Negative.   Respiratory: Negative.     Cardiovascular: Negative.   Gastrointestinal: Negative.   Endocrine: Negative.   Genitourinary: Negative.   Musculoskeletal: Negative.   Skin: Negative.   Allergic/Immunologic: Negative.   Neurological: Negative.   Hematological: Negative.   Psychiatric/Behavioral: Negative.      Objective: Vital Signs: BP (!) 142/87   Pulse 83   Ht '5\' 9"'$  (1.753 m)   Wt 264 lb (119.7 kg)   LMP 09/28/2018   BMI 38.99 kg/m   Physical Exam Constitutional:      Appearance: She is well-developed.  HENT:     Head: Normocephalic and atraumatic.  Eyes:     Pupils: Pupils are equal, round, and reactive to light.  Pulmonary:     Effort: Pulmonary effort is normal.     Breath sounds: Normal breath sounds.  Abdominal:     General: Bowel sounds are normal.     Palpations: Abdomen is soft.  Musculoskeletal:     Cervical back: Normal range of motion and neck supple.     Lumbar back: Negative right straight leg raise test and negative left straight leg raise test.  Skin:    General: Skin is warm and dry.  Neurological:     Mental Status: She is alert and oriented to person, place, and time.  Psychiatric:  Behavior: Behavior normal.        Thought Content: Thought content normal.        Judgment: Judgment normal.   Back Exam   Tenderness  The patient is experiencing tenderness in the lumbar.  Range of Motion  Extension:  abnormal  Lateral bend right:  abnormal  Lateral bend left:  abnormal  Rotation right:  abnormal  Rotation left:  abnormal   Muscle Strength  Right Quadriceps:  5/5  Left Quadriceps:  5/5  Right Hamstrings:  5/5  Left Hamstrings:  5/5   Tests  Straight leg raise right: negative Straight leg raise left: negative  Reflexes  Patellar:  0/4 Achilles:  0/4 Babinski's sign: normal   Other  Toe walk: normal Heel walk: normal Sensation: normal Gait: normal  Erythema: no back redness Scars: present    Specialty Comments:  No specialty comments  available.  Imaging: No results found.   PMFS History: Patient Active Problem List   Diagnosis Date Noted  . Herniated nucleus pulposus, cervical 04/23/2021    Priority: High    Class: Chronic  . Spinal stenosis of cervical region 04/23/2021    Priority: High    Class: Chronic  . OAB (overactive bladder) 08/22/2021  . Vaginal atrophy 08/22/2021  . Primary hypertension 08/22/2021  . Hyperglycemia 07/24/2021  . Morbid obesity ([place]) 06/14/2021  . Asthmatic bronchitis , chronic ([place]) 05/16/2021  . S/P cervical spinal fusion 04/23/2021  . Carpal tunnel syndrome, left upper limb 02/28/2021  . Genetic testing 12/12/2020  . Other spondylosis with radiculopathy, cervical region 11/29/2020  . Osteoarthritis of thoracic spine 11/29/2020  . Abnormal gait 11/29/2020  . Numbness and tingling 11/29/2020  . Need for shingles vaccine 11/29/2020  . Need for hepatitis C screening test 11/29/2020  . Weakness of both lower extremities 11/29/2020  . Muscle spasm of back 11/29/2020  . Mild intermittent asthma without complication 72/11/4707  . Family history of breast cancer 11/28/2020  . Family history of ovarian cancer 11/28/2020  . Family history of colon cancer 11/28/2020  . Family history of throat cancer 11/28/2020  . Family history of lung cancer 11/28/2020  . Family history of leukemia 11/28/2020  . Allergic rhinitis 11/17/2014  . Anxiety 09/08/2014  . Acute asthma exacerbation 09/08/2014  . Affective bipolar disorder ([place]) 09/08/2014  . 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/08/2014  . Headache, migraine 09/08/2014  . Excess, menstruation 09/08/2014   Past Medical History:  Diagnosis Date  . Asthma   . Bipolar 1 disorder ([place])   . COVID-19 03/29/2021  . Family history of breast cancer   . Family history of colon cancer   . Family history of leukemia   . Family history of lung cancer   . Family history of ovarian cancer   . Family history of throat cancer     Family History  Problem  Relation Age of Onset  . COPD Mother   . Hyperlipidemia Mother   . Lung cancer Mother 63  . Hyperlipidemia Father   . Leukemia Father 109  . COPD Father   . Depression Sister        ANXIETY  . Healthy Sister   . Breast cancer Maternal Aunt        dx 42s, recurrence 69s  . Throat cancer Maternal Uncle   . Lung cancer Paternal Uncle   . Colon cancer Maternal Grandmother   . Ovarian cancer Cousin 77       paternal  . Ovarian cancer Cousin 63  Past Surgical History:  Procedure Laterality Date  . ANKLE FRACTURE SURGERY Right 1992  . ANTERIOR CERVICAL DECOMP/DISCECTOMY FUSION N/A 04/23/2021   Procedure: ANTERIOR CERVICAL DISCECTOMIES AND FUSION C4-5, C5-6 AND C6-7 WITH PLATES, SCREWS, ALLOGRAFT ACDF GRAFT, LOCAL BONE GRAFT, VIVIGEN;  Surgeon: Jessy Oto, MD;  Location: [facility];  Service: Orthopedics;  Laterality: N/A;  . CARPAL TUNNEL RELEASE Left 04/23/2021   Procedure: LEFT OPEN CARPAL TUNNEL RELEASE;  Surgeon: Jessy Oto, MD;  Location: [facility];  Service: Orthopedics;  Laterality: Left;  . CHOLECYSTECTOMY    . COLONOSCOPY WITH PROPOFOL N/A 07/09/2020   Procedure: COLONOSCOPY WITH PROPOFOL;  Surgeon: Jonathon Bellows, MD;  Location[facility];  Service: Gastroenterology;  Laterality: N/A;   Social History   Occupational History    Comment: Full-time  Tobacco Use  . Smoking status: Former    Packs/day: 0.50    Years: 25.00    Total pack years: 12.50    Types: Cigarettes    Quit date: 03/17/2004    Years since quitting: 17.4  . Smokeless tobacco: Never  Vaping Use  . Vaping Use: Some days  . Substances: Flavoring  Substance and Sexual Activity  . Alcohol use: Yes    Comment: occ  . Drug use: No  . Sexual activity: Not on file

## 2021-09-05 NOTE — Patient Instructions (Signed)
Avoid bending, stooping and avoid lifting weights greater than 10 lbs. Avoid prolong standing and walking. Avoid frequent bending and stooping  No lifting greater than 10 lbs. May use ice or moist heat for pain. Weight loss is of benefit. Handicap license is approved. Dr. Newton's secretary/Assistant will call to arrange for epidural steroid injection  

## 2021-09-28 ENCOUNTER — Other Ambulatory Visit: Payer: Self-pay | Admitting: Family Medicine

## 2021-09-28 DIAGNOSIS — G479 Sleep disorder, unspecified: Secondary | ICD-10-CM

## 2021-09-30 ENCOUNTER — Ambulatory Visit: Payer: Self-pay

## 2021-09-30 ENCOUNTER — Ambulatory Visit: Payer: 59 | Admitting: Physical Medicine and Rehabilitation

## 2021-09-30 ENCOUNTER — Encounter: Payer: Self-pay | Admitting: Physical Medicine and Rehabilitation

## 2021-09-30 VITALS — BP 133/86 | HR 92

## 2021-09-30 DIAGNOSIS — M5416 Radiculopathy, lumbar region: Secondary | ICD-10-CM

## 2021-09-30 MED ORDER — DEXAMETHASONE SODIUM PHOSPHATE 10 MG/ML IJ SOLN
15.0000 mg | Freq: Once | INTRAMUSCULAR | Status: AC
  Administered 2021-09-30: 15 mg

## 2021-09-30 NOTE — Patient Instructions (Signed)

## 2021-09-30 NOTE — Progress Notes (Signed)
Pt state lower back pain that travels down her left hip and leg. Pt state laying down makes the pain worse. Pt state she takes over the counter pain meds to help ease her pain.  Numeric Pain Rating Scale and Functional Assessment Average Pain 7   In the last MONTH (on 0-10 scale) has pain interfered with the following?  1. General activity like being  able to carry out your everyday physical activities such as walking, climbing stairs, carrying groceries, or moving a chair?  Rating(10)   +Driver, -BT, -Dye Allergies.

## 2021-10-01 ENCOUNTER — Other Ambulatory Visit: Payer: Self-pay | Admitting: Family Medicine

## 2021-10-01 DIAGNOSIS — Z1231 Encounter for screening mammogram for malignant neoplasm of breast: Secondary | ICD-10-CM

## 2021-10-10 ENCOUNTER — Encounter: Payer: 59 | Admitting: Physical Medicine and Rehabilitation

## 2021-10-14 NOTE — Procedures (Signed)
Lumbosacral Transforaminal Epidural Steroid Injection - Sub-Pedicular Approach with Fluoroscopic Guidance  Patient: Megan Jimenez      Date of Birth: 1965/05/16 MRN: [id number] PCP: Gwyneth Sprout, FNP      Visit Date: 09/30/2021   Universal Protocol:    Date/Time: 09/30/2021  Consent Given By: the patient  Position: PRONE  Additional Comments: Vital signs were monitored before and after the procedure. Patient was prepped and draped in the usual sterile fashion. The correct patient, procedure, and site was verified.   Injection Procedure Details:   Procedure diagnoses: Lumbar radiculopathy [M54.16]    Meds Administered:  Meds ordered this encounter  Medications   dexamethasone (DECADRON) injection 15 mg    Laterality: Left  Location/Site: L1  Needle:5.0 in., 22 ga.  Short bevel or Quincke spinal needle  Needle Placement: Transforaminal  Findings:    -Comments: Excellent flow of contrast along the nerve, nerve root and into the epidural space.  Procedure Details: After squaring off the end-plates to get a true AP view, the C-arm was positioned so that an oblique view of the foramen as noted above was visualized. The target area is just inferior to the "nose of the scotty dog" or sub pedicular. The soft tissues overlying this structure were infiltrated with 2-3 ml. of 1% Lidocaine without Epinephrine.  The spinal needle was inserted toward the target using a "trajectory" view along the fluoroscope beam.  Under AP and lateral visualization, the needle was advanced so it did not puncture dura and was located close the 6 O'Clock position of the pedical in AP tracterory. Biplanar projections were used to confirm position. Aspiration was confirmed to be negative for CSF and/or blood. A 1-2 ml. volume of Isovue-250 was injected and flow of contrast was noted at each level. Radiographs were obtained for documentation purposes.   After attaining the desired flow of contrast  documented above, a 0.5 to 1.0 ml test dose of 0.25% Marcaine was injected into each respective transforaminal space.  The patient was observed for 90 seconds post injection.  After no sensory deficits were reported, and normal lower extremity motor function was noted,   the above injectate was administered so that equal amounts of the injectate were placed at each foramen (level) into the transforaminal epidural space.   Additional Comments:  No complications occurred Dressing: 2 x 2 sterile gauze and Band-Aid    Post-procedure details: Patient was observed during the procedure. Post-procedure instructions were reviewed.  Patient left the clinic in stable condition.

## 2021-10-14 NOTE — Progress Notes (Signed)
Megan Jimenez - 56 y.o. female MRN [id number]  Date of birth: 08/08/1965  Office Visit Note: Visit Date: 09/30/2021 PCP: Gwyneth Sprout, FNP Referred by: Jessy Oto, MD  Subjective: Chief Complaint  Patient presents with   Lower Back - Pain   Left Hip - Pain   Left Leg - Pain   HPI:  Megan Jimenez is a 56 y.o. female who comes in today at the request of Dr. [name] for planned Left L1-2 Lumbar Transforaminal epidural steroid injection with fluoroscopic guidance.  The patient has failed conservative care including home exercise, medications, time and activity modification.  This injection will be diagnostic and hopefully therapeutic.  Please see requesting physician notes for further details and justification.   ROS Otherwise per HPI.  Assessment & Plan: Visit Diagnoses:    ICD-10-CM   1. Lumbar radiculopathy  M54.16 XR C-ARM NO REPORT    Epidural Steroid injection    dexamethasone (DECADRON) injection 15 mg      Plan: No additional findings.   Meds & Orders:  Meds ordered this encounter  Medications   dexamethasone (DECADRON) injection 15 mg    Orders Placed This Encounter  Procedures   XR C-ARM NO REPORT   Epidural Steroid injection    Follow-up: Return for visit to requesting provider as needed.   Procedures: No procedures performed  Lumbosacral Transforaminal Epidural Steroid Injection - Sub-Pedicular Approach with Fluoroscopic Guidance  Patient: Megan Jimenez      Date of Birth: 1965/08/30 MRN: [id number] PCP: Gwyneth Sprout, FNP      Visit Date: 09/30/2021   Universal Protocol:    Date/Time: 09/30/2021  Consent Given By: the patient  Position: PRONE  Additional Comments: Vital signs were monitored before and after the procedure. Patient was prepped and draped in the usual sterile fashion. The correct patient, procedure, and site was verified.   Injection Procedure Details:   Procedure diagnoses: Lumbar radiculopathy [M54.16]     Meds Administered:  Meds ordered this encounter  Medications   dexamethasone (DECADRON) injection 15 mg    Laterality: Left  Location/Site: L1  Needle:5.0 in., 22 ga.  Short bevel or Quincke spinal needle  Needle Placement: Transforaminal  Findings:    -Comments: Excellent flow of contrast along the nerve, nerve root and into the epidural space.  Procedure Details: After squaring off the end-plates to get a true AP view, the C-arm was positioned so that an oblique view of the foramen as noted above was visualized. The target area is just inferior to the "nose of the scotty dog" or sub pedicular. The soft tissues overlying this structure were infiltrated with 2-3 ml. of 1% Lidocaine without Epinephrine.  The spinal needle was inserted toward the target using a "trajectory" view along the fluoroscope beam.  Under AP and lateral visualization, the needle was advanced so it did not puncture dura and was located close the 6 O'Clock position of the pedical in AP tracterory. Biplanar projections were used to confirm position. Aspiration was confirmed to be negative for CSF and/or blood. A 1-2 ml. volume of Isovue-250 was injected and flow of contrast was noted at each level. Radiographs were obtained for documentation purposes.   After attaining the desired flow of contrast documented above, a 0.5 to 1.0 ml test dose of 0.25% Marcaine was injected into each respective transforaminal space.  The patient was observed for 90 seconds post injection.  After no sensory deficits were reported, and normal lower extremity  motor function was noted,   the above injectate was administered so that equal amounts of the injectate were placed at each foramen (level) into the transforaminal epidural space.   Additional Comments:  No complications occurred Dressing: 2 x 2 sterile gauze and Band-Aid    Post-procedure details: Patient was observed during the procedure. Post-procedure instructions were  reviewed.  Patient left the clinic in stable condition.    Clinical History: No specialty comments available.     Objective:  VS:  HT:    WT:   BMI:     BP:133/86  HR:92bpm  TEMP: ( )  RESP:  Physical Exam Vitals and nursing note reviewed.  Constitutional:      General: She is not in acute distress.    Appearance: Normal appearance. She is not ill-appearing.  HENT:     Head: Normocephalic and atraumatic.     Right Ear: External ear normal.     Left Ear: External ear normal.  Eyes:     Extraocular Movements: Extraocular movements intact.  Cardiovascular:     Rate and Rhythm: Normal rate.     Pulses: Normal pulses.  Pulmonary:     Effort: Pulmonary effort is normal. No respiratory distress.  Abdominal:     General: There is no distension.     Palpations: Abdomen is soft.  Musculoskeletal:        General: Tenderness present.     Cervical back: Neck supple.     Right lower leg: No edema.     Left lower leg: No edema.     Comments: Patient has good distal strength with no pain over the greater trochanters.  No clonus or focal weakness.  Skin:    Findings: No erythema, lesion or rash.  Neurological:     General: No focal deficit present.     Mental Status: She is alert and oriented to person, place, and time.     Sensory: No sensory deficit.     Motor: No weakness or abnormal muscle tone.     Coordination: Coordination normal.  Psychiatric:        Mood and Affect: Mood normal.        Behavior: Behavior normal.      Imaging: No results found.

## 2021-10-16 ENCOUNTER — Telehealth: Payer: Self-pay

## 2021-10-16 NOTE — Telephone Encounter (Signed)
LVM for patient to return call to [facility] about paperwork that was received from [employer]. We need to know if this company was selected by the patient prior to signing paper work and return to [employer] with patient HPI

## 2021-10-23 ENCOUNTER — Encounter: Payer: 59 | Admitting: Physical Medicine and Rehabilitation

## 2021-10-25 ENCOUNTER — Other Ambulatory Visit: Payer: Self-pay | Admitting: Family Medicine

## 2021-10-25 ENCOUNTER — Ambulatory Visit: Payer: 59 | Admitting: Internal Medicine

## 2021-10-25 DIAGNOSIS — R739 Hyperglycemia, unspecified: Secondary | ICD-10-CM

## 2021-10-25 NOTE — Telephone Encounter (Signed)
Requested Prescriptions  Pending Prescriptions Disp Refills  . metFORMIN (GLUCOPHAGE-XR) 750 MG 24 hr tablet [Pharmacy Med Name: METFORMIN HCL ER 750 MG TABLET] 180 tablet 0    Sig: TAKE 1 TABLET (750 MG TOTAL) BY MOUTH 2 (TWO) TIMES DAILY AFTER A MEAL.     Endocrinology:  Diabetes - Biguanides Passed - 10/25/2021  2:24 AM      Passed - Cr in normal range and within 360 days    Creatinine  Date Value Ref Range Status  10/19/2013 0.53 (L) 0.60 - 1.30 mg/dL Final   Creatinine, Ser  Date Value Ref Range Status  04/24/2021 0.58 0.44 - 1.00 mg/dL Final         Passed - HBA1C is between 0 and 7.9 and within 180 days    Hgb A1c MFr Bld  Date Value Ref Range Status  07/23/2021 5.6 4.8 - 5.6 % Final    Comment:             Prediabetes: 5.7 - 6.4          Diabetes: >6.4          Glycemic control for adults with diabetes: <7.0          Passed - eGFR in normal range and within 360 days    EGFR (African American)  Date Value Ref Range Status  10/19/2013 >60  Final   GFR calc Af Amer  Date Value Ref Range Status  04/26/2020 119 >59 mL/min/1.73 Final    Comment:    **In accordance with recommendations from the NKF-ASN Task force,**   [facility] is in the process of updating its eGFR calculation to the   2021 CKD-EPI creatinine equation that estimates kidney function   without a race variable.    EGFR (Non-African Amer.)  Date Value Ref Range Status  10/19/2013 >60  Final    Comment:    eGFR values <66m/min/1.73 m2 may be an indication of chronic kidney disease (CKD). Calculated eGFR is useful in patients with stable renal function. The eGFR calculation will not be reliable in acutely ill patients when serum creatinine is changing rapidly. It is not useful in  patients on dialysis. The eGFR calculation may not be applicable to patients at the low and high extremes of body sizes, pregnant women, and vegetarians.    GFR, Estimated  Date Value Ref Range Status  04/24/2021 >60 >60  mL/min Final    Comment:    (NOTE) Calculated using the CKD-EPI Creatinine Equation (2021)    eGFR  Date Value Ref Range Status  11/29/2020 108 >59 mL/min/1.73 Final         Passed - B12 Level in normal range and within 720 days    Vitamin B-12  Date Value Ref Range Status  11/29/2020 440 232 - 1,245 pg/mL Final         Passed - Valid encounter within last 6 months    Recent Outpatient Visits          2 months ago O[facility] (overactive bladder)   [facility]   4 months ago Morbid obesity ([facility]   11 months ago Osteoarthritis of cervical spine, unspecified spinal osteoarthritis complication status   [facility]   1 year ago [facility]ical pneumonia   [facility]  1 year ago [facility]      Future Appointments            In 2 weeks Melvyn Novas Christena Deem, MD [facility]           Passed - CBC within normal limits and completed in the last 12 months    WBC  Date Value Ref Range Status  05/16/2021 6.5 4.0 - 10.5 K/uL Final   RBC  Date Value Ref Range Status  05/16/2021 3.78 (L) 3.87 - 5.11 MIL/uL Final   Hemoglobin  Date Value Ref Range Status  05/16/2021 11.5 (L) 12.0 - 15.0 g/dL Final  11/29/2020 12.3 11.1 - 15.9 g/dL Final   HCT  Date Value Ref Range Status  05/16/2021 36.2 36.0 - 46.0 % Final   Hematocrit  Date Value Ref Range Status  11/29/2020 36.4 34.0 - 46.6 % Final   MCHC  Date Value Ref Range Status  05/16/2021 31.8 30.0 - 36.0 g/dL Final   [facility]  Date Value Ref Range Status  05/16/2021 30.4 26.0 - 34.0 pg Final   MCV  Date Value Ref Range Status  05/16/2021 95.8 80.0 - 100.0 fL Final  11/29/2020 91 79 - 97 fL Final  10/19/2013 92 80 - 100 fL Final   No results found for: "PLTCOUNTKUC", "LABPLAT", "POCPLA" RDW   Date Value Ref Range Status  05/16/2021 12.5 11.5 - 15.5 % Final  11/29/2020 11.9 11.7 - 15.4 % Final  10/19/2013 14.1 11.5 - 14.5 % Final

## 2021-10-28 ENCOUNTER — Other Ambulatory Visit: Payer: Self-pay | Admitting: Internal Medicine

## 2021-10-28 MED ORDER — BREZTRI AEROSPHERE 160-9-4.8 MCG/ACT IN AERO: 2.0000 | INHALATION_SPRAY | Freq: Two times a day (BID) | RESPIRATORY_TRACT | 11 refills | Status: DC

## 2021-10-30 NOTE — Progress Notes (Signed)
Established patient visit  I,Joseline E Rosas,acting as a scribe for Gwyneth Sprout, FNP.,have documented all relevant documentation on the behalf of Gwyneth Sprout, FNP,as directed by  Gwyneth Sprout, FNP while in the presence of Gwyneth Sprout, FNP.   Patient: Megan Jimenez   DOB: 1965/10/05   56 y.o. Female  MRN: [id number] Visit Date: 11/01/2021  Today's healthcare provider: Gwyneth Sprout, FNP  Re Introduced to nurse practitioner role and practice setting.  All questions answered.  Discussed provider/patient relationship and expectations.   Chief Complaint  Patient presents with   follow-up weight and prediabetes   Subjective    HPI  Prediabetes, Follow-up  Lab Results  Component Value Date   HGBA1C 5.6 07/23/2021   HGBA1C 5.8 (H) 11/29/2020   HGBA1C 5.7 (H) 04/26/2020   GLUCOSE 123 (H) 04/24/2021   GLUCOSE 106 (H) 03/29/2021   GLUCOSE 107 (H) 11/29/2020    Last seen for for this 5 months ago. Metformin 750 XR. Current symptoms include none and have been stable.  Follow up for weight management  The patient was last seen for this 5 months ago. Changes made at last visit include Recommend alternative plan of phentermine at 37.5.  She reports excellent compliance with treatment. She is not having side effects. Reports for the first week she was having palpitations but is all better.  -----------------------------------------------------------------------------------------   Pertinent Labs:    Component Value Date/Time   CHOL 208 (H) 04/26/2020 1605   TRIG 140 04/26/2020 1605   CHOLHDL 3.1 04/28/2018 1153   CREATININE 0.58 04/24/2021 0645   CREATININE 0.53 (L) 10/19/2013 1323    Wt Readings from Last 3 Encounters:  11/01/21 246 lb (111.6 kg)  09/05/21 264 lb (119.7 kg)  08/22/21 264 lb (119.7 kg)    -----------------------------------------------------------------------------------------   Medications: Outpatient Medications Prior to Visit   Medication Sig   albuterol (PROVENTIL) (2.5 MG/3ML) 0.083% nebulizer solution Take 3 mLs (2.5 mg total) by nebulization every 6 (six) hours as needed for wheezing or shortness of breath.   albuterol (VENTOLIN HFA) 108 (90 Base) MCG/ACT inhaler Inhale 1-2 puffs into the lungs every 4 (four) hours as needed for wheezing or shortness of breath.   ALPRAZolam (XANAX) 1 MG tablet Take 1 mg by mouth 4 (four) times daily as needed for anxiety.   Ascorbic Acid (VITAMIN C) 1000 MG tablet Take 1,000 mg by mouth daily.   Budeson-Glycopyrrol-Formoterol (BREZTRI AEROSPHERE) 160-9-4.8 MCG/ACT AERO Inhale 2 puffs into the lungs in the morning and at bedtime.   CAPLYTA 42 MG CAPS Take 42 mg by mouth at bedtime.   Eszopiclone 3 MG TABS TAKE 1 TABLET BY MOUTH AT BEDTIME TAKE IMMEDIATELY BEFORE BEDTIME   ferrous sulfate 325 (65 FE) MG tablet Take 325 mg by mouth daily with breakfast.   fluticasone (FLONASE) 50 MCG/ACT nasal spray SPRAY 2 SPRAYS INTO EACH NOSTRIL EVERY DAY   loratadine (CLARITIN) 10 MG tablet Take 10 mg by mouth daily.   metFORMIN (GLUCOPHAGE-XR) 750 MG 24 hr tablet TAKE 1 TABLET (750 MG TOTAL) BY MOUTH 2 (TWO) TIMES DAILY AFTER A MEAL.   methocarbamol (ROBAXIN) 500 MG tablet TAKE 1 TABLET BY MOUTH EVERY 6 HOURS AS NEEDED FOR MUSCLE SPASMS.   montelukast (SINGULAIR) 10 MG tablet Take 1 tablet (10 mg total) by mouth at bedtime.   Multiple Vitamin (MULTIVITAMIN WITH MINERALS) TABS tablet Take 1 tablet by mouth daily.   vitamin B-12 (CYANOCOBALAMIN) 500 MCG tablet Take  500 mcg by mouth daily.   Vitamin D, Ergocalciferol, (DRISDOL) 50000 units CAPS capsule Take 50,000 Units by mouth every Friday.    [DISCONTINUED] phentermine 37.5 MG capsule Take 1 capsule (37.5 mg total) by mouth every morning.   [DISCONTINUED] diclofenac Sodium (VOLTAREN) 1 % GEL APPLY 2 GRAMS TO AFFECTED AREA 4 TIMES A DAY   [DISCONTINUED] hydrocortisone cream 0.5 % Apply 1 application. topically 2 (two) times daily. Use after Rx  steroid, pea size amount. External use only.   [DISCONTINUED] mineral oil-hydrophilic petrolatum (AQUAPHOR) ointment Apply topically as needed for dry skin.   [DISCONTINUED] oxybutynin (DITROPAN XL) 10 MG 24 hr tablet Take 1 tablet (10 mg total) by mouth at bedtime.   [DISCONTINUED] Scar Treatment Products [facility] ADVANCED SCAR GEL) GEL Apply 1 Film topically 3 (three) times daily.   [DISCONTINUED] triamcinolone ointment (KENALOG) 0.5 % Apply 1 application. topically 2 (two) times daily. Pea size amount to external vaginal/vulva. Max of 7 days.   No facility-administered medications prior to visit.    Review of Systems    Objective    BP 124/80   Pulse 92   Temp 98.5 F (36.9 C) (Oral)   Resp 16   Ht '5\' 9"'$  (1.753 m)   Wt 246 lb (111.6 kg)   LMP 09/28/2018   BMI 36.33 kg/m   Physical Exam Vitals and nursing note reviewed.  Constitutional:      General: She is not in acute distress.    Appearance: Normal appearance. She is obese. She is not ill-appearing, toxic-appearing or diaphoretic.  HENT:     Head: Normocephalic and atraumatic.  Cardiovascular:     Rate and Rhythm: Normal rate and regular rhythm.     Pulses: Normal pulses.     Heart sounds: Normal heart sounds. No murmur heard.    No friction rub. No gallop.  Pulmonary:     Effort: Pulmonary effort is normal. No respiratory distress.     Breath sounds: Normal breath sounds. No stridor. No wheezing, rhonchi or rales.  Chest:     Chest wall: No tenderness.  Musculoskeletal:        General: No swelling, tenderness, deformity or signs of injury. Normal range of motion.     Right lower leg: No edema.     Left lower leg: No edema.  Skin:    General: Skin is warm and dry.     Capillary Refill: Capillary refill takes less than 2 seconds.     Coloration: Skin is not jaundiced or pale.     Findings: No bruising, erythema, lesion or rash.  Neurological:     General: No focal deficit present.     Mental Status: She is  alert and oriented to person, place, and time. Mental status is at baseline.     Cranial Nerves: No cranial nerve deficit.     Sensory: No sensory deficit.     Motor: No weakness.     Coordination: Coordination normal.  Psychiatric:        Mood and Affect: Mood normal.        Behavior: Behavior normal.        Thought Content: Thought content normal.        Judgment: Judgment normal.       No results found for any visits on 11/01/21.  Assessment & Plan     Problem List Items Addressed This Visit       Cardiovascular and Mediastinum   Primary hypertension - Primary  Chronic, stable Repeat CMP Currently controlled with diet/exercise  Goal <140/<90      Relevant Orders   Comprehensive metabolic panel     Other   Morbid obesity (HCC)    Chronic, improved Body mass index is 36.33 kg/m. OK to continue phentermine x 3 months to assist PDMP reviewed Continue to recommend balanced, lower carb meals. Smaller meal size, adding snacks. Choosing water as drink of choice and increasing purposeful exercise. Associated with HTN, pre-diabetes, chronic pain        Relevant Medications   phentermine 37.5 MG capsule   Other Relevant Orders   Comprehensive metabolic panel   Hemoglobin A1c   Prediabetes    Chronic, stable Repeat A1c Goal <7% Continue Metformin XR 750 mg BID      Relevant Orders   Hemoglobin A1c     Return in about 3 months (around 02/01/2022) for chonic disease management.      Vonna Kotyk, FNP, have reviewed all documentation for this visit. The documentation on 11/01/21 for the exam, diagnosis, procedures, and orders are all accurate and complete.    Gwyneth Sprout, [place] [phone] (phone) [phone] (fax)  [place]

## 2021-11-01 ENCOUNTER — Ambulatory Visit (INDEPENDENT_AMBULATORY_CARE_PROVIDER_SITE_OTHER): Payer: Medicare Other | Admitting: Family Medicine

## 2021-11-01 ENCOUNTER — Encounter: Payer: Self-pay | Admitting: Family Medicine

## 2021-11-01 VITALS — BP 124/80 | HR 92 | Temp 98.5°F | Resp 16 | Ht 69.0 in | Wt 246.0 lb

## 2021-11-01 DIAGNOSIS — I10 Essential (primary) hypertension: Secondary | ICD-10-CM | POA: Diagnosis not present

## 2021-11-01 DIAGNOSIS — R7303 Prediabetes: Secondary | ICD-10-CM | POA: Insufficient documentation

## 2021-11-01 MED ORDER — PHENTERMINE HCL 37.5 MG PO CAPS: 37.5000 mg | ORAL_CAPSULE | ORAL | 0 refills | Status: DC

## 2021-11-01 NOTE — Assessment & Plan Note (Signed)
Chronic, stable Repeat CMP Currently controlled with diet/exercise  Goal <140/<90

## 2021-11-01 NOTE — Assessment & Plan Note (Addendum)
Chronic, improved Body mass index is 36.33 kg/m. OK to continue phentermine x 3 months to assist PDMP reviewed Continue to recommend balanced, lower carb meals. Smaller meal size, adding snacks. Choosing water as drink of choice and increasing purposeful exercise. Associated with HTN, pre-diabetes, chronic pain

## 2021-11-01 NOTE — Assessment & Plan Note (Signed)
Chronic, stable Repeat A1c Goal <7% Continue Metformin XR 750 mg BID

## 2021-11-02 LAB — COMPREHENSIVE METABOLIC PANEL
ALT: 17 IU/L (ref 0–32)
AST: 12 IU/L (ref 0–40)
Albumin/Globulin Ratio: 2.2 (ref 1.2–2.2)
Albumin: 3.8 g/dL (ref 3.8–4.9)
Alkaline Phosphatase: 68 IU/L (ref 44–121)
BUN/Creatinine Ratio: 25 — ABNORMAL HIGH (ref 9–23)
BUN: 15 mg/dL (ref 6–24)
Bilirubin Total: 0.3 mg/dL (ref 0.0–1.2)
CO2: 25 mmol/L (ref 20–29)
Calcium: 9.2 mg/dL (ref 8.7–10.2)
Chloride: 103 mmol/L (ref 96–106)
Creatinine, Ser: 0.6 mg/dL (ref 0.57–1.00)
Globulin, Total: 1.7 g/dL (ref 1.5–4.5)
Glucose: 100 mg/dL — ABNORMAL HIGH (ref 70–99)
Potassium: 4.7 mmol/L (ref 3.5–5.2)
Sodium: 141 mmol/L (ref 134–144)
Total Protein: 5.5 g/dL — ABNORMAL LOW (ref 6.0–8.5)
eGFR: 105 mL/min/{1.73_m2} (ref >59)

## 2021-11-02 LAB — HEMOGLOBIN A1C
Est. average glucose Bld gHb Est-mCnc: 111 mg/dL
Hgb A1c MFr Bld: 5.5 % (ref 4.8–5.6)

## 2021-11-03 ENCOUNTER — Other Ambulatory Visit: Payer: Self-pay | Admitting: Family Medicine

## 2021-11-03 ENCOUNTER — Other Ambulatory Visit: Payer: Self-pay | Admitting: Specialist

## 2021-11-03 DIAGNOSIS — J4520 Mild intermittent asthma, uncomplicated: Secondary | ICD-10-CM

## 2021-11-04 ENCOUNTER — Other Ambulatory Visit: Payer: Self-pay | Admitting: Family Medicine

## 2021-11-04 DIAGNOSIS — J4520 Mild intermittent asthma, uncomplicated: Secondary | ICD-10-CM

## 2021-11-04 NOTE — Progress Notes (Signed)
Improved blood sugar. Protein remains low at 5.5 g/dL. Improved A1c tp 5.5% Continue to recommend balanced, lower carb meals. Smaller meal size, adding snacks. Choosing water as drink of choice and increasing purposeful exercise.  Gwyneth Sprout, [address] [place], [place] [address] [phone] (phone) [phone] (fax) [place]

## 2021-11-07 ENCOUNTER — Other Ambulatory Visit: Payer: Self-pay | Admitting: Family Medicine

## 2021-11-07 DIAGNOSIS — G479 Sleep disorder, unspecified: Secondary | ICD-10-CM

## 2021-11-08 ENCOUNTER — Ambulatory Visit: Payer: 59 | Admitting: Internal Medicine

## 2021-11-08 ENCOUNTER — Telehealth: Payer: Self-pay | Admitting: Internal Medicine

## 2021-11-08 NOTE — Telephone Encounter (Signed)
Spoke to [name] with [facility], she is questioning if patient should be taking symbicort or Breztri.  Megan Jimenez is aware that per last OV note, patient was prescribed Breztri. Nothing further needed.

## 2021-11-27 ENCOUNTER — Other Ambulatory Visit: Payer: Self-pay | Admitting: Family Medicine

## 2021-11-27 DIAGNOSIS — J301 Allergic rhinitis due to pollen: Secondary | ICD-10-CM

## 2021-12-02 ENCOUNTER — Ambulatory Visit (INDEPENDENT_AMBULATORY_CARE_PROVIDER_SITE_OTHER): Payer: Medicare Other

## 2021-12-02 ENCOUNTER — Encounter: Payer: Self-pay | Admitting: Specialist

## 2021-12-02 ENCOUNTER — Ambulatory Visit (INDEPENDENT_AMBULATORY_CARE_PROVIDER_SITE_OTHER): Payer: Medicare Other | Admitting: Specialist

## 2021-12-02 VITALS — BP 130/87 | HR 82 | Ht 69.0 in | Wt 246.0 lb

## 2021-12-02 DIAGNOSIS — M25551 Pain in right hip: Secondary | ICD-10-CM

## 2021-12-02 DIAGNOSIS — M25512 Pain in left shoulder: Secondary | ICD-10-CM

## 2021-12-02 DIAGNOSIS — M5416 Radiculopathy, lumbar region: Secondary | ICD-10-CM | POA: Diagnosis not present

## 2021-12-02 DIAGNOSIS — Z981 Arthrodesis status: Secondary | ICD-10-CM | POA: Diagnosis not present

## 2021-12-02 DIAGNOSIS — M5136 Other intervertebral disc degeneration, lumbar region: Secondary | ICD-10-CM | POA: Diagnosis not present

## 2021-12-02 DIAGNOSIS — R202 Paresthesia of skin: Secondary | ICD-10-CM

## 2021-12-02 DIAGNOSIS — M19012 Primary osteoarthritis, left shoulder: Secondary | ICD-10-CM

## 2021-12-02 DIAGNOSIS — M47816 Spondylosis without myelopathy or radiculopathy, lumbar region: Secondary | ICD-10-CM

## 2021-12-02 DIAGNOSIS — M5440 Lumbago with sciatica, unspecified side: Secondary | ICD-10-CM | POA: Diagnosis not present

## 2021-12-02 DIAGNOSIS — M25561 Pain in right knee: Secondary | ICD-10-CM

## 2021-12-02 DIAGNOSIS — R200 Anesthesia of skin: Secondary | ICD-10-CM

## 2021-12-02 DIAGNOSIS — M1611 Unilateral primary osteoarthritis, right hip: Secondary | ICD-10-CM

## 2021-12-02 DIAGNOSIS — R32 Unspecified urinary incontinence: Secondary | ICD-10-CM

## 2021-12-02 DIAGNOSIS — R159 Full incontinence of feces: Secondary | ICD-10-CM

## 2021-12-02 DIAGNOSIS — S129XXA Fracture of neck, unspecified, initial encounter: Secondary | ICD-10-CM | POA: Diagnosis not present

## 2021-12-02 MED ORDER — PREGABALIN 50 MG PO CAPS: 50.0000 mg | ORAL_CAPSULE | Freq: Two times a day (BID) | ORAL | 0 refills | Status: DC

## 2021-12-02 MED ORDER — MELOXICAM 15 MG PO TABS: 15.0000 mg | ORAL_TABLET | Freq: Every day | ORAL | 2 refills | Status: DC

## 2021-12-02 NOTE — Patient Instructions (Addendum)
Plan: Avoid frequent bending and stooping  No lifting greater than 10 lbs. May use ice or moist heat for pain. Weight loss is of benefit. Best medication for lumbar disc disease is arthritis medications like meloxicam celebrex and naprosyn. Exercise is important to improve your indurance and does allow people to function better inspite of back pain. Start lyrica and stop gabapentin Referral to neurology due to incontinence and left arm numbness and tingling for eval You have radiographic findings of left shoulder arthrosis, right more than left hip arthrosis.  Xrays of the knees are ordered and I will look at these later today and call with report. Lab work: Vit B12, folate, Uric acid, sed rate, ANA, RF. Follow-Up Instructions: No follow-ups on file.    Findings of knee radiographs call to patient after clinic. Will refer to joint specialist for eval and treatment for right hip OA and right knee OA,

## 2021-12-02 NOTE — Progress Notes (Signed)
Office Visit Note   Patient: Megan Jimenez           Date of Birth: 07/30/1965           MRN: [id number] Visit Date: 12/02/2021              Requested by: Gwyneth Sprout, [address] [place],  [place] [id number] PCP: Gwyneth Sprout, FNP   Assessment & Plan: Visit Diagnoses:  1. S/P cervical spinal fusion   2. Lumbar radiculopathy   3. Left shoulder pain, unspecified chronicity     Plan: Avoid frequent bending and stooping  No lifting greater than 10 lbs. May use ice or moist heat for pain. Weight loss is of benefit. Best medication for lumbar disc disease is arthritis medications like meloxicam celebrex and naprosyn. Exercise is important to improve your indurance and does allow people to function better inspite of back pain. Start lyrica and stop gabapentin Referral to neurology due to incontinence and left arm numbness and tingling for eval You have radiographic findings of left shoulder arthrosis, right more than left hip arthrosis.  Xrays of the knees are ordered and I will look at these later today and call with report. Lab work: Vit B12, folate, Uric acid, sed rate, ANA, RF. Follow-Up Instructions: No follow-ups on file.   Orders:  Orders Placed This Encounter  Procedures   XR Cervical Spine 2 or 3 views   XR Lumbar Spine 2-3 Views   XR Shoulder Left   No orders of the defined types were placed in this encounter.     Procedures: No procedures performed   Clinical Data: No additional findings.   Subjective: Chief Complaint  Patient presents with   Left Shoulder - Pain   Lower Back - Pain    56 year old female with history of back and neck pain and recently with symptoms of pain into the arms and there is tingling with numbness left arm and weakness. Her knees and hips are painful. Feels like someone is jabbing a knife into her knees. She has some problems with weak bladder. Now she relates that she is having both stress incontinence.  Happened 3 times last week. She is having to wear depends, after the 3rd time she had Fecal incontinence. She can not walk a mile, has to hold onto the cart when grocery shopping.   Review of Systems  Constitutional: Negative.   HENT: Negative.    Eyes: Negative.   Respiratory: Negative.    Cardiovascular: Negative.   Gastrointestinal: Negative.   Endocrine: Negative.   Genitourinary: Negative.   Musculoskeletal: Negative.   Skin: Negative.   Allergic/Immunologic: Negative.   Neurological: Negative.   Hematological: Negative.   Psychiatric/Behavioral: Negative.      Objective: Vital Signs: BP 130/87 (BP Location: Left Arm, Patient Position: Sitting)   Pulse 82   Ht '5\' 9"'$  (1.753 m)   Wt 246 lb (111.6 kg)   LMP 09/28/2018   BMI 36.33 kg/m   Physical Exam Constitutional:      Appearance: She is well-developed.  HENT:     Head: Normocephalic and atraumatic.  Eyes:     Pupils: Pupils are equal, round, and reactive to light.  Pulmonary:     Effort: Pulmonary effort is normal.     Breath sounds: Normal breath sounds.  Abdominal:     General: Bowel sounds are normal.     Palpations: Abdomen is soft.  Musculoskeletal:  General: Normal range of motion.     Cervical back: Normal range of motion and neck supple.  Skin:    General: Skin is warm and dry.  Neurological:     Mental Status: She is alert and oriented to person, place, and time.  Psychiatric:        Behavior: Behavior normal.        Thought Content: Thought content normal.        Judgment: Judgment normal.   Ortho Exam  Specialty Comments:  No specialty comments available.  Imaging: No results found.   PMFS History: Patient Active Problem List   Diagnosis Date Noted   Herniated nucleus pulposus, cervical 04/23/2021    Priority: High    Class: Chronic   Spinal stenosis of cervical region 04/23/2021    Priority: High    Class: Chronic   Prediabetes 11/01/2021   OAB (overactive bladder)  08/22/2021   Vaginal atrophy 08/22/2021   Primary hypertension 08/22/2021   Hyperglycemia 07/24/2021   Morbid obesity ([place]) 06/14/2021   Asthmatic bronchitis , chronic ([place]) 05/16/2021   S/P cervical spinal fusion 04/23/2021   Carpal tunnel syndrome, left upper limb 02/28/2021   Genetic testing 12/12/2020   Other spondylosis with radiculopathy, cervical region 11/29/2020   Osteoarthritis of thoracic spine 11/29/2020   Abnormal gait 11/29/2020   Numbness and tingling 11/29/2020   Need for shingles vaccine 11/29/2020   Need for hepatitis C screening test 11/29/2020   Weakness of both lower extremities 11/29/2020   Muscle spasm of back 11/29/2020   Mild intermittent asthma without complication 70/26/3785   Family history of breast cancer 11/28/2020   Family history of ovarian cancer 11/28/2020   Family history of colon cancer 11/28/2020   Family history of throat cancer 11/28/2020   Family history of lung cancer 11/28/2020   Family history of leukemia 11/28/2020   Allergic rhinitis 11/17/2014   Anxiety 09/08/2014   Acute asthma exacerbation 09/08/2014   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/08/2014   Headache, migraine 09/08/2014   Excess, menstruation 09/08/2014   Past Medical History:  Diagnosis Date   Asthma    Bipolar 1 disorder ([place])    COVID-19 03/29/2021   Family history of breast cancer    Family history of colon cancer    Family history of leukemia    Family history of lung cancer    Family history of ovarian cancer    Family history of throat cancer     Family History  Problem Relation Age of Onset   COPD Mother    Hyperlipidemia Mother    Lung cancer Mother 64   Hyperlipidemia Father    Leukemia Father 106   COPD Father    Depression Sister        ANXIETY   Healthy Sister    Breast cancer Maternal Aunt        dx 70s, recurrence 59s   Throat cancer Maternal Uncle    Lung cancer Paternal Uncle    Colon cancer Maternal Grandmother    Ovarian cancer Cousin 69        paternal   Ovarian cancer Cousin 80    Past Surgical History:  Procedure Laterality Date   ANKLE FRACTURE SURGERY Right 1992   ANTERIOR CERVICAL DECOMP/DISCECTOMY FUSION N/A 04/23/2021   Procedure: ANTERIOR CERVICAL DISCECTOMIES AND FUSION C4-5, C5-6 AND C6-7 WITH PLATES, SCREWS, ALLOGRAFT ACDF GRAFT, LOCAL BONE GRAFT, VIVIGEN;  Surgeon: Jessy Oto, MD;  Location: [place];  Service: Orthopedics;  Laterality: N/A;  CARPAL TUNNEL RELEASE Left 04/23/2021   Procedure: LEFT OPEN CARPAL TUNNEL RELEASE;  Surgeon: Jessy Oto, MD;  Location: [place];  Service: Orthopedics;  Laterality: Left;   CHOLECYSTECTOMY     COLONOSCOPY WITH PROPOFOL N/A 07/09/2020   Procedure: COLONOSCOPY WITH PROPOFOL;  Surgeon: Jonathon Bellows, MD;  Location[facility];  Service: Gastroenterology;  Laterality: N/A;   Social History   Occupational History    Comment: Full-time  Tobacco Use   Smoking status: Former    Packs/day: 0.50    Years: 25.00    Total pack years: 12.50    Types: Cigarettes    Quit date: 03/17/2004    Years since quitting: 17.7   Smokeless tobacco: Never  Vaping Use   Vaping Use: Some days   Substances: Flavoring  Substance and Sexual Activity   Alcohol use: Yes    Comment: occ   Drug use: No   Sexual activity: Not on file

## 2021-12-04 LAB — SEDIMENTATION RATE: Sed Rate: 2 mm/h (ref 0–30)

## 2021-12-04 LAB — ANA: Anti Nuclear Antibody (ANA): NEGATIVE

## 2021-12-04 LAB — RHEUMATOID FACTOR: Rheumatoid fact SerPl-aCnc: <14 IU/mL (ref <14)

## 2021-12-04 LAB — URIC ACID: Uric Acid, Serum: 3.1 mg/dL (ref 2.5–7.0)

## 2021-12-04 LAB — VITAMIN B12: Vitamin B-12: 1221 pg/mL — ABNORMAL HIGH (ref 200–1100)

## 2021-12-04 LAB — FOLATE: Folate: >24 ng/mL

## 2021-12-06 ENCOUNTER — Ambulatory Visit
Admission: RE | Admit: 2021-12-06 | Discharge: 2021-12-06 | Disposition: A | Discharge status: Home / Self Care | Payer: Medicare Other | Source: Ambulatory Visit | Attending: Specialist | Admitting: Specialist

## 2021-12-06 DIAGNOSIS — Z981 Arthrodesis status: Secondary | ICD-10-CM

## 2021-12-06 DIAGNOSIS — M542 Cervicalgia: Secondary | ICD-10-CM | POA: Diagnosis not present

## 2021-12-06 DIAGNOSIS — S129XXA Fracture of neck, unspecified, initial encounter: Secondary | ICD-10-CM

## 2021-12-06 DIAGNOSIS — R200 Anesthesia of skin: Secondary | ICD-10-CM

## 2021-12-06 DIAGNOSIS — R159 Full incontinence of feces: Secondary | ICD-10-CM

## 2021-12-10 ENCOUNTER — Encounter: Payer: Self-pay | Admitting: Specialist

## 2021-12-24 ENCOUNTER — Ambulatory Visit
Admission: RE | Admit: 2021-12-24 | Discharge: 2021-12-24 | Disposition: A | Discharge status: Home / Self Care | Payer: Medicare Other | Source: Ambulatory Visit | Attending: Family Medicine | Admitting: Family Medicine

## 2021-12-24 DIAGNOSIS — Z1231 Encounter for screening mammogram for malignant neoplasm of breast: Secondary | ICD-10-CM | POA: Diagnosis not present

## 2021-12-25 ENCOUNTER — Ambulatory Visit (INDEPENDENT_AMBULATORY_CARE_PROVIDER_SITE_OTHER): Payer: Medicare Other | Admitting: Orthopaedic Surgery

## 2021-12-25 ENCOUNTER — Other Ambulatory Visit: Payer: Self-pay | Admitting: Family Medicine

## 2021-12-25 DIAGNOSIS — M1711 Unilateral primary osteoarthritis, right knee: Secondary | ICD-10-CM | POA: Insufficient documentation

## 2021-12-25 DIAGNOSIS — M7061 Trochanteric bursitis, right hip: Secondary | ICD-10-CM | POA: Diagnosis not present

## 2021-12-25 DIAGNOSIS — M25561 Pain in right knee: Secondary | ICD-10-CM | POA: Diagnosis not present

## 2021-12-25 DIAGNOSIS — G8929 Other chronic pain: Secondary | ICD-10-CM | POA: Diagnosis not present

## 2021-12-25 NOTE — Progress Notes (Signed)
Hi Morgaine  Normal mammogram; repeat in 1 year.  Please let us know if you have any questions.  Thank you,  Tally Joe, FNP

## 2021-12-25 NOTE — Progress Notes (Signed)
The patient is a very pleasant and active 56 year old female that I am seeing for the first time.  She is a patient of Dr. Louanne Skye.  She comes in with debilitating right knee pain is getting worse.  This is also causing pain on the right hip on the lateral side.  She is having swelling in the knee and is worse at night.  She has throbbing type of pain and cannot sleep on the right hip at night.  The right knee is the most painful thing to her in terms of all of her joints.  She had previous spine surgery.  She has tried and failed all forms conservative treatment for that right knee.  X-rays on the canopy system for me to review as well.  At this point her right knee pain is daily and it is 10 out of 10.  It is detrimentally affecting her mobility, her quality of life and her actives daily living.  She is a diabetic but has a hemoglobin A1c of below 6.  There is no listed fever, chills, nausea, vomiting.  She denies any chest pain or shortness of breath.  Examination of her right knee shows significant valgus malalignment.  There is severe medial and lateral joint line tenderness as well as patellofemoral tenderness and pain throughout the arc of motion of that knee.  Right hip moves smoothly and fluidly but there is pain to palpation of the trochanteric area of her right hip.  X-rays of the right knee show severe end-stage arthritis with valgus malalignment.  There are osteophytes in all 3 compartments with bone-on-bone wear of the lateral compartment and the patellofemoral joint.  I did explain the fact that she has severe arthritis of her right knee and the best treatment for now would be a knee replacement.  She has been through quad training exercises and therapy.  She has tried all of forms of conservative treatment.  Injections at this point would not be worthwhile given the severity of the arthritis and her clinical exam findings.  I believe she is a fall risk now due to the valgus malalignment of her  knee.  I showed her knee replacement model and explained in detail what the surgery involves.  We described the risk and benefits of surgery and what to expect from an intraoperative and postoperative course.  We will work on getting this scheduled for right knee replacement.

## 2021-12-30 ENCOUNTER — Ambulatory Visit
Admission: RE | Admit: 2021-12-30 | Discharge: 2021-12-30 | Disposition: A | Discharge status: Home / Self Care | Payer: Medicare Other | Source: Ambulatory Visit | Attending: Family Medicine | Admitting: Family Medicine

## 2021-12-30 ENCOUNTER — Ambulatory Visit
Admission: RE | Admit: 2021-12-30 | Discharge: 2021-12-30 | Disposition: A | Discharge status: Home / Self Care | Payer: Medicare Other | Attending: Family Medicine | Admitting: Family Medicine

## 2021-12-30 ENCOUNTER — Ambulatory Visit (INDEPENDENT_AMBULATORY_CARE_PROVIDER_SITE_OTHER): Payer: Medicare Other | Admitting: Family Medicine

## 2021-12-30 ENCOUNTER — Encounter: Payer: Self-pay | Admitting: Family Medicine

## 2021-12-30 VITALS — BP 125/83 | HR 98 | Temp 98.3°F | Resp 20 | Wt 237.7 lb

## 2021-12-30 DIAGNOSIS — J4541 Moderate persistent asthma with (acute) exacerbation: Secondary | ICD-10-CM

## 2021-12-30 DIAGNOSIS — R062 Wheezing: Secondary | ICD-10-CM | POA: Diagnosis not present

## 2021-12-30 DIAGNOSIS — J069 Acute upper respiratory infection, unspecified: Secondary | ICD-10-CM | POA: Diagnosis not present

## 2021-12-30 DIAGNOSIS — R059 Cough, unspecified: Secondary | ICD-10-CM | POA: Diagnosis not present

## 2021-12-30 LAB — POCT INFLUENZA A/B
Influenza A, POC: NEGATIVE
Influenza B, POC: NEGATIVE

## 2021-12-30 LAB — POC COVID19 BINAXNOW: SARS Coronavirus 2 Ag: NEGATIVE

## 2021-12-30 MED ORDER — PREDNISONE 20 MG PO TABS: 40.0000 mg | ORAL_TABLET | Freq: Every day | ORAL | 0 refills | Status: AC

## 2021-12-30 NOTE — Progress Notes (Signed)
I,Sulibeya S Dimas,acting as a scribe for Lavon Paganini, MD.,have documented all relevant documentation on the behalf of Lavon Paganini, MD,as directed by  Lavon Paganini, MD while in the presence of Lavon Paganini, MD.     Established patient visit   Patient: Megan Jimenez   DOB: 10/01/1965   56 y.o. Female  MRN: [id number] Visit Date: 12/30/2021  Today's healthcare provider: Lavon Paganini, MD   Chief Complaint  Patient presents with  . URI   Subjective    URI  Associated symptoms include congestion, coughing, headaches, nausea, rhinorrhea, sinus pain, a sore throat and wheezing.    Upper respiratory symptoms She complains of congestion, fever 102, non productive cough, shortness of breath, sore throat, and wheezing.with fever to 102 degrees Fahrenheit, chills. Onset of symptoms was a few days ago and worsening.She is drinking plenty of fluids.  Past history is significant for asthma. Patient is non-smoker  +malaise, hoarse voice Started with sore throat 4 days ago  Had flu shot already  Home covid test negative  Taking mucinex and tylenol ---------------------------------------------------------------------------------------------------   Medications: Outpatient Medications Prior to Visit  Medication Sig  . Eszopiclone 3 MG TABS Take 1 tablet (3 mg total) by mouth at bedtime.  [name] albuterol (PROVENTIL) (2.5 MG/3ML) 0.083% nebulizer solution Take 3 mLs (2.5 mg total) by nebulization every 6 (six) hours as needed for wheezing or shortness of breath.  [name] albuterol (VENTOLIN HFA) 108 (90 Base) MCG/ACT inhaler TAKE 2 PUFFS BY MOUTH EVERY 4 HOURS AS NEEDED FOR COUGH OR WHEEZING  . ALPRAZolam (XANAX) 1 MG tablet Take 1 mg by mouth 4 (four) times daily as needed for anxiety.  . Ascorbic Acid (VITAMIN C) 1000 MG tablet Take 1,000 mg by mouth daily.  . Budeson-Glycopyrrol-Formoterol (BREZTRI AEROSPHERE) 160-9-4.8 MCG/ACT AERO Inhale 2 puffs into the lungs in the  morning and at bedtime.  . CAPLYTA 42 MG CAPS Take 42 mg by mouth at bedtime.  . ferrous sulfate 325 (65 FE) MG tablet Take 325 mg by mouth daily with breakfast.  . fluticasone (FLONASE) 50 MCG/ACT nasal spray SPRAY 2 SPRAYS INTO EACH NOSTRIL EVERY DAY  . loratadine (CLARITIN) 10 MG tablet Take 10 mg by mouth daily.  . meloxicam (MOBIC) 15 MG tablet Take 1 tablet (15 mg total) by mouth daily.  . metFORMIN (GLUCOPHAGE-XR) 750 MG 24 hr tablet TAKE 1 TABLET (750 MG TOTAL) BY MOUTH 2 (TWO) TIMES DAILY AFTER A MEAL.  . methocarbamol (ROBAXIN) 500 MG tablet TAKE 1 TABLET BY MOUTH EVERY 6 HOURS AS NEEDED FOR MUSCLE SPASMS.  . montelukast (SINGULAIR) 10 MG tablet TAKE 1 TABLET BY MOUTH EVERYDAY AT BEDTIME  . Multiple Vitamin (MULTIVITAMIN WITH MINERALS) TABS tablet Take 1 tablet by mouth daily.  . phentermine 37.5 MG capsule Take 1 capsule (37.5 mg total) by mouth every morning.  . pregabalin (LYRICA) 50 MG capsule Take 1 capsule (50 mg total) by mouth 2 (two) times daily.  . vitamin B-12 (CYANOCOBALAMIN) 500 MCG tablet Take 500 mcg by mouth daily.  . Vitamin D, Ergocalciferol, (DRISDOL) 50000 units CAPS capsule Take 50,000 Units by mouth every Friday.    No facility-administered medications prior to visit.    Review of Systems  Constitutional:  Positive for chills and fatigue.  HENT:  Positive for congestion, rhinorrhea, sinus pain, sore throat and voice change.   Respiratory:  Positive for cough, chest tightness, shortness of breath and wheezing.   Gastrointestinal:  Positive for nausea.  Neurological:  Positive for weakness  and headaches.    {Labs  Heme  Chem  Endocrine  Serology  Results Review (optional):23779}   Objective    BP 125/83 (BP Location: Right Arm, Patient Position: Sitting, Cuff Size: Large)   Pulse 98   Temp 98.3 F (36.8 C) (Oral)   Resp 20   Wt 237 lb 11.2 oz (107.8 kg)   LMP 09/28/2018   SpO2 95%   BMI 35.10 kg/m  BP Readings from Last 3 Encounters:   12/30/21 125/83  12/02/21 130/87  11/01/21 124/80   Wt Readings from Last 3 Encounters:  12/30/21 237 lb 11.2 oz (107.8 kg)  12/02/21 246 lb (111.6 kg)  11/01/21 246 lb (111.6 kg)      Physical Exam Vitals reviewed.  Constitutional:      General: She is not in acute distress.    Appearance: Normal appearance. She is well-developed. She is not diaphoretic.  HENT:     Head: Normocephalic and atraumatic.     Right Ear: Tympanic membrane, ear canal and external ear normal.     Left Ear: Tympanic membrane, ear canal and external ear normal.     Nose: Nose normal.     Mouth/Throat:     Mouth: Mucous membranes are moist.     Pharynx: Oropharynx is clear. Posterior oropharyngeal erythema present. No oropharyngeal exudate.  Eyes:     General: No scleral icterus.    Conjunctiva/sclera: Conjunctivae normal.     Pupils: Pupils are equal, round, and reactive to light.  Neck:     Thyroid: No thyromegaly.  Cardiovascular:     Rate and Rhythm: Normal rate and regular rhythm.     Pulses: Normal pulses.     Heart sounds: Normal heart sounds. No murmur heard. Pulmonary:     Effort: Pulmonary effort is normal. No respiratory distress.     Breath sounds: Wheezing (fine and coarse wheeze diffusely) present. No rhonchi or rales.  Musculoskeletal:     Cervical back: Neck supple.     Right lower leg: No edema.     Left lower leg: No edema.  Lymphadenopathy:     Cervical: No cervical adenopathy.  Skin:    General: Skin is warm and dry.  Neurological:     Mental Status: She is alert and oriented to person, place, and time. Mental status is at baseline.  Psychiatric:        Mood and Affect: Mood normal.        Behavior: Behavior normal.      No results found for any visits on 12/30/21.  Assessment & Plan     1. Moderate persistent asthma with acute exacerbation - new problem likely likely 2/2 URI - now with substantial wheeze - start prednisone 40 mg daily x7 days - discussed that  there could be CAP that we are unable to hear due to wheeze, so check CXR - discussed return/emergent precuaitons - DG Chest 2 View; Future  2. Upper respiratory tract infection, unspecified type - symptoms and exam c/w viral URI with 2ndary asthma exac - check for CAP on CXR as above - flu and covid testing - discussed symptomatic management, natural course, and return precautions  - DG Chest 2 View; Future   Return if symptoms worsen or fail to improve.      I, Lavon Paganini, MD, have reviewed all documentation for this visit. The documentation on 12/30/21 for the exam, diagnosis, procedures, and orders are all accurate and complete.   Virginia Crews, MD,  [facility]

## 2022-01-01 ENCOUNTER — Ambulatory Visit: Payer: Self-pay | Admitting: *Deleted

## 2022-01-01 NOTE — Telephone Encounter (Signed)
Reason for Disposition  [1] Fever returns after gone for over 24 hours AND [2] symptoms worse or not improved  Answer Assessment - Initial Assessment Questions 1. ONSET: "When did the cough begin?"      I'm been sick since last Thursday. Coughing up thick green mucus from chest and blowing it from my nose.   Sinus congestion.  I have a headache so bad.   Fevers intermittently shortness of breath, hoarse voice.   I'm aching all over and weak.   They did a Covid test and flu test are negative.   The x ray was done and it was clear. 2. SEVERITY: "How bad is the cough today?"      Very frequent.  My sister is sick too.    3. SPUTUM: "Describe the color of your sputum" (none, dry cough; clear, white, yellow, green)     Dark green mucus.  My sister's Covid test is positive and we live together. 4. HEMOPTYSIS: "Are you coughing up any blood?" If so ask: "How much?" (flecks, streaks, tablespoons, etc.)     No 5. DIFFICULTY BREATHING: "Are you having difficulty breathing?" If Yes, ask: "How bad is it?" (e.g., mild, moderate, severe)    - MILD: No SOB at rest, mild SOB with walking, speaks normally in sentences, can lie down, no retractions, pulse < 100.    - MODERATE: SOB at rest, SOB with minimal exertion and prefers to sit, cannot lie down flat, speaks in phrases, mild retractions, audible wheezing, pulse 100-120.    - SEVERE: Very SOB at rest, speaks in single words, struggling to breathe, sitting hunched forward, retractions, pulse > 120      Shortness of breath.    I'm on prednisone.  It has helped.   I feel worse.    6. FEVER: "Do you have a fever?" If Yes, ask: "What is your temperature, how was it measured, and when did it start?"     Yes and chills and body aches.     7. CARDIAC HISTORY: "Do you have any history of heart disease?" (e.g., heart attack, congestive heart failure)      Not asked 8. LUNG HISTORY: "Do you have any history of lung disease?"  (e.g., pulmonary embolus, asthma,  emphysema)     Not asked just seen 2 days ago.    9. PE RISK FACTORS: "Do you have a history of blood clots?" (or: recent major surgery, recent prolonged travel, bedridden)     Not asked 10. OTHER SYMPTOMS: "Do you have any other symptoms?" (e.g., runny nose, wheezing, chest pain)       Sinus congestion, green thick mucus from chest and nose,    11. PREGNANCY: "Is there any chance you are pregnant?" "When was your last menstrual period?"       NA due to age 63. TRAVEL: "Have you traveled out of the country in the last month?" (e.g., travel history, exposures)       No travels.   Sister went to urgent care yesterday because she is having the same symptoms and her Covid test was positive.   Pt is having the same symptoms as her sister.   She has not retested for Covid since she is having the same symptoms.    Interested in starting an antiviral.  Protocols used: Cough - Acute Productive-A-AH

## 2022-01-01 NOTE — Telephone Encounter (Signed)
Spoke with patient.

## 2022-01-01 NOTE — Telephone Encounter (Signed)
  Chief Complaint: Seen 12/30/2021 by Dr. Brita Romp for URI symptoms.   Her symptoms have become worse.   Her sister, who lives with her is Covid positive after going to urgent care yesterday for same symptoms as pt.    Symptoms: Thick, green mucus coughed up and blowing from her nose, weak, fever, aching all over, coughing a lot, headaches, a lot of head congestion.   She is sure she is Covid positive too even though 2 days ago her Covid test was negative.  Doesn't have a home test to retest.   Frequency: Since last Thursday but progressively worse since yesterday.    Pertinent Negatives: Patient denies feeling better even with the prednisone Disposition: '[]'$ ED /'[]'$ Urgent Care (no appt availability in office) / '[]'$ Appointment(In office/virtual)/ '[]'$  [facility]/ '[]'$ Home Care/ '[]'$ Refused Recommended Disposition /'[]'$ [facility]/ '[x]'$  Follow-up with PCP Additional Notes: High priority message sent to [facility] for Dr. Brita Romp since that's who she saw.   Pt agreeable to being called back.

## 2022-01-02 ENCOUNTER — Ambulatory Visit: Payer: Medicare Other | Admitting: Specialist

## 2022-01-03 ENCOUNTER — Ambulatory Visit: Payer: Medicare Other | Admitting: Specialist

## 2022-01-06 ENCOUNTER — Other Ambulatory Visit: Payer: Self-pay | Admitting: Specialist

## 2022-01-06 ENCOUNTER — Other Ambulatory Visit: Payer: Self-pay | Admitting: Family Medicine

## 2022-01-09 ENCOUNTER — Ambulatory Visit: Payer: Medicare Other | Admitting: Specialist

## 2022-01-13 ENCOUNTER — Other Ambulatory Visit: Payer: Self-pay

## 2022-01-22 ENCOUNTER — Ambulatory Visit: Payer: Medicare Other | Admitting: Neurology

## 2022-01-22 ENCOUNTER — Encounter: Payer: Self-pay | Admitting: Neurology

## 2022-01-22 VITALS — BP 122/84 | HR 92 | Ht 69.0 in | Wt 231.0 lb

## 2022-01-22 DIAGNOSIS — M5412 Radiculopathy, cervical region: Secondary | ICD-10-CM

## 2022-01-22 DIAGNOSIS — R202 Paresthesia of skin: Secondary | ICD-10-CM | POA: Diagnosis not present

## 2022-01-22 MED ORDER — PREGABALIN 100 MG PO CAPS: 100.0000 mg | ORAL_CAPSULE | Freq: Three times a day (TID) | ORAL | 5 refills | Status: DC

## 2022-01-22 MED ORDER — DULOXETINE HCL 30 MG PO CPEP: 30.0000 mg | ORAL_CAPSULE | Freq: Every day | ORAL | 0 refills | Status: DC

## 2022-01-22 MED ORDER — DULOXETINE HCL 60 MG PO CPEP: 60.0000 mg | ORAL_CAPSULE | Freq: Every day | ORAL | 3 refills | Status: DC

## 2022-01-22 NOTE — Progress Notes (Deleted)
MyChart Video Visit    Virtual Visit via Video Note   This visit type was conducted due to national recommendations for restrictions regarding the COVID-19 Pandemic (e.g. social distancing) in an effort to limit this patient's exposure and mitigate transmission in our community. This patient is at least at moderate risk for complications without adequate follow up. This format is felt to be most appropriate for this patient at this time. Physical exam was limited by quality of the video and audio technology used for the visit.   Patient location: *** Provider location: ***  I discussed the limitations of evaluation and management by telemedicine and the availability of in person appointments. The patient expressed understanding and agreed to proceed.  Patient: Megan Jimenez   DOB: December 26, 1965   56 y.o. Female  MRN: [id number] Visit Date: 01/23/2022  Today's healthcare provider: Lavon Paganini, MD   No chief complaint on file.  Subjective    HPI  Upper respiratory symptoms She complains of {uri sx's' brief:15453}.with {systemic_sx:15294}. Onset of symptoms was {onset initial:119223} and {progression:119226}.She {hydration history:15378}.  Past history is significant for {respiratory illness:412}. Patient is {smoker?:15292}  ---------------------------------------------------------------------------------------------------    Medications: Outpatient Medications Prior to Visit  Medication Sig   albuterol (PROVENTIL) (2.5 MG/3ML) 0.083% nebulizer solution Take 3 mLs (2.5 mg total) by nebulization every 6 (six) hours as needed for wheezing or shortness of breath.   albuterol (VENTOLIN HFA) 108 (90 Base) MCG/ACT inhaler TAKE 2 PUFFS BY MOUTH EVERY 4 HOURS AS NEEDED FOR COUGH OR WHEEZING   ALPRAZolam (XANAX) 1 MG tablet Take 1 mg by mouth 4 (four) times daily as needed for anxiety.   Ascorbic Acid (VITAMIN C) 1000 MG tablet Take 1,000 mg by mouth daily.   CAPLYTA 42 MG CAPS  Take 42 mg by mouth at bedtime.   DULoxetine (CYMBALTA) 30 MG capsule Take 1 capsule (30 mg total) by mouth daily.   DULoxetine (CYMBALTA) 60 MG capsule Take 1 capsule (60 mg total) by mouth daily.   Eszopiclone 3 MG TABS Take 1 tablet (3 mg total) by mouth at bedtime.   ferrous sulfate 325 (65 FE) MG tablet Take 325 mg by mouth daily with breakfast.   fluticasone (FLONASE) 50 MCG/ACT nasal spray SPRAY 2 SPRAYS INTO EACH NOSTRIL EVERY DAY   Fluticasone Furoate-Vilanterol (BREO ELLIPTA IN) Inhale into the lungs.   loratadine (CLARITIN) 10 MG tablet Take 10 mg by mouth daily.   meloxicam (MOBIC) 15 MG tablet Take 1 tablet (15 mg total) by mouth daily.   metFORMIN (GLUCOPHAGE-XR) 750 MG 24 hr tablet TAKE 1 TABLET (750 MG TOTAL) BY MOUTH 2 (TWO) TIMES DAILY AFTER A MEAL.   methocarbamol (ROBAXIN) 500 MG tablet TAKE 1 TABLET BY MOUTH EVERY 6 HOURS AS NEEDED FOR MUSCLE SPASMS.   montelukast (SINGULAIR) 10 MG tablet TAKE 1 TABLET BY MOUTH EVERYDAY AT BEDTIME   Multiple Vitamin (MULTIVITAMIN WITH MINERALS) TABS tablet Take 1 tablet by mouth daily.   phentermine 37.5 MG capsule Take 1 capsule (37.5 mg total) by mouth every morning.   pregabalin (LYRICA) 100 MG capsule Take 1 capsule (100 mg total) by mouth 3 (three) times daily.   vitamin B-12 (CYANOCOBALAMIN) 500 MCG tablet Take 500 mcg by mouth daily.   Vitamin D, Ergocalciferol, (DRISDOL) 50000 units CAPS capsule Take 50,000 Units by mouth every Friday.    No facility-administered medications prior to visit.    Review of Systems  {Labs  Heme  Chem  Endocrine  Serology  Results Review (optional):23779}   Objective    LMP 09/28/2018   {Show previous vital signs (optional):23777}   Physical Exam     Assessment & Plan     ***  No follow-ups on file.     I discussed the assessment and treatment plan with the patient. The patient was provided an opportunity to ask questions and all were answered. The patient agreed with the plan  and demonstrated an understanding of the instructions.   The patient was advised to call back or seek an in-person evaluation if the symptoms worsen or if the condition fails to improve as anticipated.  I provided *** minutes of non-face-to-face time during this encounter.  {provider attestation***:1}  Lavon Paganini, MD [facility] [phone] (phone) [phone] (fax)  [place]

## 2022-01-22 NOTE — Progress Notes (Signed)
Chief Complaint  Patient presents with   New Patient (Initial Visit)    Rm 14. Alone. NP/internal referral for numbness, paresthesias.      ASSESSMENT AND PLAN  Megan Jimenez is a 56 y.o. female   Cervical degenerative changes, status post ACDF C4-5-6, radiating pain to left shoulder left arm paresthesia, Left low back pain, radiating discomfort to left lower extremity, Differentiation diagnoses Include left cervical radiculopathy versus focal neuropathy EMG nerve conduction study Add on Cymbalta, starting 30 mg titrating to 60 mg daily Higher dose of Lyrica 100 mg 3 times daily Personally reviewed MRI of lumbar spine from October 2022 only mild degenerative changes no significant canal foraminal narrowing MRI of cervical spine, prepostsurgical in the postsurgical CT cervical spine, solid ACDF C4-7, no significant canal or foraminal narrowing If above study failed to demonstrate etiology for her left-sided symptoms, may consider MRI of the brain to rule out right hemisphere pathology   DIAGNOSTIC DATA (LABS, IMAGING, TESTING) - I reviewed patient records, labs, notes, testing and imaging myself where available.   MEDICAL HISTORY:  Megan Jimenez, is a 56 year old female seen in request by her orthopedic surgeon Dr. Jessy Oto, for evaluation of left arm radiating paresthesia, initial evaluation is on January 22, 2022, [facility] at [facility], Gwyneth Sprout, FNP   I reviewed and summarized the referring note. PMHX Depression, anxiety Bipolar DM Obesity Cervical ACDF C4-5-6-7, in Feb 2023, presenting with neck pain, radiating pain to both shoulder, she still has left shoulder, arm paresthesia, weakness,  Chronic low back pain, DJD, radiating pain to left leg. Pending right knee replacement on Dec 26th 2023.  She had multiple joint degenerative disease, pending right knee replacement March 11, 2022, complains of significant knee pain, gait  abnormality because of that,   Also had a history of cervical decompression surgery February 2023 by Dr. Louanne Skye, ACDF C4-5 C6-7, prior to the surgery she presented with significant neck pain, radiating pain to both shoulders especially the left side,  I personally reviewed presurgical MRI cervical spine October 2022, multilevel degenerative disease, severe left foraminal stenosis at C4-5, C5-6, moderate on the left at C3-4, right at C4-5, C5-6 bilaterally C6-7  MRI of lumbar showed mild degenerative changes, deformity of right eccentric L4 superior endplate without marrow edema, potentially remote fracture, no signal in foraminal or canal stenosis.  She complains of long history of bladder incontinence since 2018, urinary urgency, to the point of daily accident where past, also worsening bowel incontinence, accident few times a week since 2023  Cervical decompression surgery did not help her neck pain, but she continued to have intermittent radiating discomfort from left neck to left shoulder, left upper extremity, she did feel numbness of her entire left arm, subjective weakness, also some puzzling sensation at the left lateral cervical collar,  She also complains of intermittent radiating pain from left lower back to left posterior thigh,   PHYSICAL EXAM:   Vitals:   01/22/22 1108  BP: 122/84  Pulse: 92  Weight: 231 lb (104.8 kg)  Height: '5\' 9"'$  (1.753 m)   Body mass index is 34.11 kg/m.  PHYSICAL EXAMNIATION:  Gen: NAD, conversant, well nourised, well groomed                     Cardiovascular: Regular rate rhythm, no peripheral edema, warm, nontender. Eyes: Conjunctivae clear without exudates or hemorrhage Neck: Supple, no carotid bruits. Pulmonary: Clear to auscultation bilaterally  NEUROLOGICAL EXAM:  MENTAL STATUS: Speech/cognition: Awake, alert, oriented to history taking and casual conversation CRANIAL NERVES: CN II: Visual fields are full to confrontation. Pupils are  round equal and briskly reactive to light. CN III, IV, VI: extraocular movement are normal. No ptosis. CN V: Facial sensation is intact to light touch CN VII: Face is symmetric with normal eye closure  CN VIII: Hearing is normal to causal conversation. CN IX, X: Phonation is normal. CN XI: Head turning and shoulder shrug are intact  MOTOR: Limited by multiple joints pain, no significant muscle weakness  REFLEXES: Reflexes are 1 and symmetric at the biceps, triceps, knees, and ankles. Plantar responses are flexor.  SENSORY: Intact to light touch, pinprick and vibratory sensation are intact in fingers and toes.  COORDINATION: There is no trunk or limb dysmetria noted.  GAIT/STANCE: Need push-up to get up from seated position, antalgic, dragging right leg  REVIEW OF SYSTEMS:  Full 14 system review of systems performed and notable only for as above All other review of systems were negative.   ALLERGIES: No Known Allergies  HOME MEDICATIONS: Current Outpatient Medications  Medication Sig Dispense Refill   albuterol (PROVENTIL) (2.5 MG/3ML) 0.083% nebulizer solution Take 3 mLs (2.5 mg total) by nebulization every 6 (six) hours as needed for wheezing or shortness of breath. 75 mL 0   albuterol (VENTOLIN HFA) 108 (90 Base) MCG/ACT inhaler TAKE 2 PUFFS BY MOUTH EVERY 4 HOURS AS NEEDED FOR COUGH OR WHEEZING 8.5 each 0   ALPRAZolam (XANAX) 1 MG tablet Take 1 mg by mouth 4 (four) times daily as needed for anxiety.     Ascorbic Acid (VITAMIN C) 1000 MG tablet Take 1,000 mg by mouth daily.     CAPLYTA 42 MG CAPS Take 42 mg by mouth at bedtime.     Eszopiclone 3 MG TABS Take 1 tablet (3 mg total) by mouth at bedtime. 90 tablet 1   ferrous sulfate 325 (65 FE) MG tablet Take 325 mg by mouth daily with breakfast.     fluticasone (FLONASE) 50 MCG/ACT nasal spray SPRAY 2 SPRAYS INTO EACH NOSTRIL EVERY DAY 48 mL 3   Fluticasone Furoate-Vilanterol (BREO ELLIPTA IN) Inhale into the lungs.      loratadine (CLARITIN) 10 MG tablet Take 10 mg by mouth daily.     meloxicam (MOBIC) 15 MG tablet Take 1 tablet (15 mg total) by mouth daily. 30 tablet 2   metFORMIN (GLUCOPHAGE-XR) 750 MG 24 hr tablet TAKE 1 TABLET (750 MG TOTAL) BY MOUTH 2 (TWO) TIMES DAILY AFTER A MEAL. 180 tablet 0   methocarbamol (ROBAXIN) 500 MG tablet TAKE 1 TABLET BY MOUTH EVERY 6 HOURS AS NEEDED FOR MUSCLE SPASMS. 50 tablet 1   montelukast (SINGULAIR) 10 MG tablet TAKE 1 TABLET BY MOUTH EVERYDAY AT BEDTIME 90 tablet 3   Multiple Vitamin (MULTIVITAMIN WITH MINERALS) TABS tablet Take 1 tablet by mouth daily.     phentermine 37.5 MG capsule Take 1 capsule (37.5 mg total) by mouth every morning. 90 capsule 0   pregabalin (LYRICA) 50 MG capsule TAKE 1 CAPSULE BY MOUTH 2 TIMES DAILY. 60 capsule 0   vitamin B-12 (CYANOCOBALAMIN) 500 MCG tablet Take 500 mcg by mouth daily.     Vitamin D, Ergocalciferol, (DRISDOL) 50000 units CAPS capsule Take 50,000 Units by mouth every Friday.   5   No current facility-administered medications for this visit.    PAST MEDICAL HISTORY: Past Medical History:  Diagnosis Date   Asthma  Bipolar 1 disorder ([place])    COVID-19 03/29/2021   Family history of breast cancer    Family history of colon cancer    Family history of leukemia    Family history of lung cancer    Family history of ovarian cancer    Family history of throat cancer     PAST SURGICAL HISTORY: Past Surgical History:  Procedure Laterality Date   ANKLE FRACTURE SURGERY Right 1992   ANTERIOR CERVICAL DECOMP/DISCECTOMY FUSION N/A 04/23/2021   Procedure: ANTERIOR CERVICAL DISCECTOMIES AND FUSION C4-5, C5-6 AND C6-7 WITH PLATES, SCREWS, ALLOGRAFT ACDF GRAFT, LOCAL BONE GRAFT, VIVIGEN;  Surgeon: Jessy Oto, MD;  Location: [place];  Service: Orthopedics;  Laterality: N/A;   CARPAL TUNNEL RELEASE Left 04/23/2021   Procedure: LEFT OPEN CARPAL TUNNEL RELEASE;  Surgeon: Jessy Oto, MD;  Location: [place];  Service: Orthopedics;   Laterality: Left;   CHOLECYSTECTOMY     COLONOSCOPY WITH PROPOFOL N/A 07/09/2020   Procedure: COLONOSCOPY WITH PROPOFOL;  Surgeon: Jonathon Bellows, MD;  Location: [facility];  Service: Gastroenterology;  Laterality: N/A;    FAMILY HISTORY: Family History  Problem Relation Age of Onset   COPD Mother    Hyperlipidemia Mother    Lung cancer Mother 20   Hyperlipidemia Father    Leukemia Father 63   COPD Father    Depression Sister        ANXIETY   Healthy Sister    Breast cancer Maternal Aunt        dx 51s, recurrence 44s   Throat cancer Maternal Uncle    Lung cancer Paternal Uncle    Colon cancer Maternal Grandmother    Ovarian cancer Cousin 80       paternal   Ovarian cancer Cousin 81    SOCIAL HISTORY: Social History   Socioeconomic History   Marital status: Married    Spouse name: Not on file   Number of children: 2   Years of education: College   Highest education level: Not on file  Occupational History    Comment: Full-time  Tobacco Use   Smoking status: Former    Packs/day: 0.50    Years: 25.00    Total pack years: 12.50    Types: Cigarettes    Quit date: 03/17/2004    Years since quitting: 17.8   Smokeless tobacco: Never  Vaping Use   Vaping Use: Some days   Substances: Flavoring  Substance and Sexual Activity   Alcohol use: Yes    Comment: occ   Drug use: No   Sexual activity: Not on file  Other Topics Concern   Not on file  Social History Narrative   Lives at home with husband   Social Determinants of Health   Financial Resource Strain: Low Risk  (09/29/2017)   Overall Financial Resource Strain (CARDIA)    Difficulty of Paying Living Expenses: Not hard at all  Food Insecurity: No Food Insecurity (09/29/2017)   Hunger Vital Sign    Worried About Running Out of Food in the Last Year: Never true    Ran Out of Food in the Last Year: Never true  Transportation Needs: No Transportation Needs (09/29/2017)   PRAPARE - Radiographer, therapeutic (Medical): No    Lack of Transportation (Non-Medical): No  Physical Activity: Insufficiently Active (09/29/2017)   Exercise Vital Sign    Days of Exercise per Week: 2 days    Minutes of Exercise per Session: 20 min  Stress: No Stress Concern Present (09/29/2017)   [place]    Feeling of Stress : Only a little  Social Connections: Moderately Integrated (09/29/2017)   Social Connection and Isolation Panel [NHANES]    Frequency of Communication with Friends and Family: Three times a week    Frequency of Social Gatherings with Friends and Family: Twice a week    Attends Religious Services: 1 to 4 times per year    Active Member of [employer] or Organizations: No    Attends Archivist Meetings: Never    Marital Status: Married  Human resources officer Violence: Not At Risk (09/29/2017)   Humiliation, Afraid, Rape, and Kick questionnaire    Fear of Current or Ex-Partner: No    Emotionally Abused: No    Physically Abused: No    Sexually Abused: No      Marcial Pacas, M.D. Ph.D.  [facility] [address] [place], [place] [address] Ph: [phone] Fax: [phone]  CC:  Jessy Oto, MD No address on file  Gwyneth Sprout, FNP

## 2022-01-23 ENCOUNTER — Telehealth: Payer: Medicare Other | Admitting: Family Medicine

## 2022-01-29 ENCOUNTER — Other Ambulatory Visit: Payer: Self-pay | Admitting: Family Medicine

## 2022-01-29 DIAGNOSIS — R739 Hyperglycemia, unspecified: Secondary | ICD-10-CM

## 2022-02-04 ENCOUNTER — Other Ambulatory Visit: Payer: Self-pay | Admitting: Family Medicine

## 2022-02-05 NOTE — Telephone Encounter (Signed)
Requested medication (s) are due for refill today: yes  Requested medication (s) are on the active medication list: yes  Last refill:  11/01/21 #90  Future visit scheduled: no  Notes to clinic:  med not delegated to reorder by NT   Requested Prescriptions  Pending Prescriptions Disp Refills   phentermine 37.5 MG capsule [Pharmacy Med Name: PHENTERMINE 37.5 MG CAPSULE] 90 capsule 0    Sig: TAKE 1 CAPSULE BY MOUTH EVERY MORNING     Not Delegated - Neurology: Anticonvulsants - Controlled - phentermine hydrochloride Failed - 02/04/2022 11:10 PM      Failed - This refill cannot be delegated      Passed - eGFR in normal range and within 360 days    EGFR (African American)  Date Value Ref Range Status  10/19/2013 >60  Final   GFR calc Af Amer  Date Value Ref Range Status  04/26/2020 119 >59 mL/min/1.73 Final    Comment:    **In accordance with recommendations from the NKF-ASN Task force,**   [facility] is in the process of updating its eGFR calculation to the   2021 CKD-EPI creatinine equation that estimates kidney function   without a race variable.    EGFR (Non-African Amer.)  Date Value Ref Range Status  10/19/2013 >60  Final    Comment:    eGFR values <39m/min/1.73 m2 may be an indication of chronic kidney disease (CKD). Calculated eGFR is useful in patients with stable renal function. The eGFR calculation will not be reliable in acutely ill patients when serum creatinine is changing rapidly. It is not useful in  patients on dialysis. The eGFR calculation may not be applicable to patients at the low and high extremes of body sizes, pregnant women, and vegetarians.    GFR, Estimated  Date Value Ref Range Status  04/24/2021 >60 >60 mL/min Final    Comment:    (NOTE) Calculated using the CKD-EPI Creatinine Equation (2021)    eGFR  Date Value Ref Range Status  11/01/2021 105 >59 mL/min/1.73 Final         Passed - Cr in normal range and within 360 days     Creatinine  Date Value Ref Range Status  10/19/2013 0.53 (L) 0.60 - 1.30 mg/dL Final   Creatinine, Ser  Date Value Ref Range Status  11/01/2021 0.60 0.57 - 1.00 mg/dL Final         Passed - Last BP in normal range    BP Readings from Last 1 Encounters:  01/22/22 122/84         Passed - Valid encounter within last 6 months    Recent Outpatient Visits           1 month ago Moderate persistent asthma with acute exacerbation   [facility]   3 months ago Primary hypertension   [facility]   5 months ago OAB (overactive bladder)   [facility]   7 months ago Morbid obesity ([facility]   [facility]   1 year ago Osteoarthritis of cervical spine, unspecified spinal osteoarthritis complication status   [facility]              Passed - Weight completed in the last 3 months    Wt Readings from Last 1 Encounters:  01/22/22 231 lb (104.8 kg)

## 2022-02-05 NOTE — Telephone Encounter (Signed)
Requested medication (s) are due for refill today: yes  Requested medication (s) are on the active medication list: yes  Last refill:  11/01/21 #90  Future visit scheduled: no  Notes to clinic:  med not delegated to NT to reorder   Requested Prescriptions  Pending Prescriptions Disp Refills   phentermine 37.5 MG capsule [Pharmacy Med Name: PHENTERMINE 37.5 MG CAPSULE] 90 capsule 0    Sig: TAKE 1 CAPSULE BY MOUTH EVERY MORNING     Not Delegated - Neurology: Anticonvulsants - Controlled - phentermine hydrochloride Failed - 02/04/2022 11:10 PM      Failed - This refill cannot be delegated      Passed - eGFR in normal range and within 360 days    EGFR (African American)  Date Value Ref Range Status  10/19/2013 >60  Final   GFR calc Af Amer  Date Value Ref Range Status  04/26/2020 119 >59 mL/min/1.73 Final    Comment:    **In accordance with recommendations from the NKF-ASN Task force,**   [facility] is in the process of updating its eGFR calculation to the   2021 CKD-EPI creatinine equation that estimates kidney function   without a race variable.    EGFR (Non-African Amer.)  Date Value Ref Range Status  10/19/2013 >60  Final    Comment:    eGFR values <52m/min/1.73 m2 may be an indication of chronic kidney disease (CKD). Calculated eGFR is useful in patients with stable renal function. The eGFR calculation will not be reliable in acutely ill patients when serum creatinine is changing rapidly. It is not useful in  patients on dialysis. The eGFR calculation may not be applicable to patients at the low and high extremes of body sizes, pregnant women, and vegetarians.    GFR, Estimated  Date Value Ref Range Status  04/24/2021 >60 >60 mL/min Final    Comment:    (NOTE) Calculated using the CKD-EPI Creatinine Equation (2021)    eGFR  Date Value Ref Range Status  11/01/2021 105 >59 mL/min/1.73 Final         Passed - Cr in normal range and within 360 days     Creatinine  Date Value Ref Range Status  10/19/2013 0.53 (L) 0.60 - 1.30 mg/dL Final   Creatinine, Ser  Date Value Ref Range Status  11/01/2021 0.60 0.57 - 1.00 mg/dL Final         Passed - Last BP in normal range    BP Readings from Last 1 Encounters:  01/22/22 122/84         Passed - Valid encounter within last 6 months    Recent Outpatient Visits           1 month ago Moderate persistent asthma with acute exacerbation   [facility]   3 months ago Primary hypertension   [facility]   5 months ago OAB (overactive bladder)   [facility]   7 months ago Morbid obesity ([facility]   [facility]   1 year ago Osteoarthritis of cervical spine, unspecified spinal osteoarthritis complication status   [facility]              Passed - Weight completed in the last 3 months    Wt Readings from Last 1 Encounters:  01/22/22 231 lb (104.8 kg)

## 2022-02-07 ENCOUNTER — Other Ambulatory Visit: Payer: Self-pay | Admitting: Family Medicine

## 2022-02-09 ENCOUNTER — Other Ambulatory Visit: Payer: Self-pay | Admitting: Family Medicine

## 2022-02-10 NOTE — Telephone Encounter (Signed)
Pt called to report that it is much cheaper to receive this from [address] instead of [facility], please reroute prescription there.

## 2022-02-10 NOTE — Addendum Note (Signed)
Addended by: Durwin Nora on: 02/10/2022 11:57 AM   Modules accepted: Orders

## 2022-02-11 MED ORDER — PHENTERMINE HCL 37.5 MG PO CAPS: 37.5000 mg | ORAL_CAPSULE | Freq: Every morning | ORAL | 0 refills | Status: DC

## 2022-02-11 NOTE — Telephone Encounter (Signed)
Pt called to see if Rx for [facility]ine was [facility] from [facility] to [facility] / she stated that her insurance doesn't cover this medication so it is cheaper for her to purchase at [facility] / please advise when sent / pt stated she has been out of medication for 2 days   [facility] [address] - [place], [place] [employer] AT [facility] [address]

## 2022-02-11 NOTE — Telephone Encounter (Signed)
Requested medications are due for refill today.  unsure  Requested medications are on the active medications list.  yes  Last refill.   Future visit scheduled.   yes  Notes to clinic.  Patient comment: I did not pick up [facility]. [facility]. Please call in to [facility] because it is much cheaper and my insurance doesn't cover it. Thank you! Ps- I am out   Refill not delegated.   Requested Prescriptions  Pending Prescriptions Disp Refills   phentermine 37.5 MG capsule 90 capsule 0    Sig: Take 1 capsule (37.5 mg total) by mouth every morning.     Not Delegated - Neurology: Anticonvulsants - Controlled - phentermine hydrochloride Failed - 02/10/2022 11:57 AM      Failed - This refill cannot be delegated      Passed - eGFR in normal range and within 360 days    EGFR (African American)  Date Value Ref Range Status  10/19/2013 >60  Final   GFR calc Af Amer  Date Value Ref Range Status  04/26/2020 119 >59 mL/min/1.73 Final    Comment:    **In accordance with recommendations from the NKF-ASN Task force,**   [facility] is in the process of updating its eGFR calculation to the   2021 CKD-EPI creatinine equation that estimates kidney function   without a race variable.    EGFR (Non-African Amer.)  Date Value Ref Range Status  10/19/2013 >60  Final    Comment:    eGFR values <50m/min/1.73 m2 may be an indication of chronic kidney disease (CKD). Calculated eGFR is useful in patients with stable renal function. The eGFR calculation will not be reliable in acutely ill patients when serum creatinine is changing rapidly. It is not useful in  patients on dialysis. The eGFR calculation may not be applicable to patients at the low and high extremes of body sizes, pregnant women, and vegetarians.    GFR, Estimated  Date Value Ref Range Status  04/24/2021 >60 >60 mL/min Final    Comment:    (NOTE) Calculated using the CKD-EPI Creatinine Equation (2021)    eGFR  Date  Value Ref Range Status  11/01/2021 105 >59 mL/min/1.73 Final         Passed - Cr in normal range and within 360 days    Creatinine  Date Value Ref Range Status  10/19/2013 0.53 (L) 0.60 - 1.30 mg/dL Final   Creatinine, Ser  Date Value Ref Range Status  11/01/2021 0.60 0.57 - 1.00 mg/dL Final         Passed - Last BP in normal range    BP Readings from Last 1 Encounters:  01/22/22 122/84         Passed - Valid encounter within last 6 months    Recent Outpatient Visits           1 month ago Moderate persistent asthma with acute exacerbation   [facility]   3 months ago Primary hypertension   [facility]   5 months ago OAB (overactive bladder)   [facility]   8 months ago [facility]bid [facility] ([facility]   [facility] F[facility]   1 year ago Osteoarthritis of cervical spine, unspecified spinal osteoarthritis complication status   [facility]              Passed - Weight  completed in the last 3 months    Wt Readings from Last 1 Encounters:  01/22/22 231 lb (104.8 kg)         Refused Prescriptions Disp Refills   phentermine 37.5 MG capsule 90 capsule 0    Sig: Take 1 capsule (37.5 mg total) by mouth every morning.     Not Delegated - Neurology: Anticonvulsants - Controlled - phentermine hydrochloride Failed - 02/10/2022 11:57 AM      Failed - This refill cannot be delegated      Passed - eGFR in normal range and within 360 days    EGFR (African American)  Date Value Ref Range Status  10/19/2013 >60  Final   GFR calc Af Amer  Date Value Ref Range Status  04/26/2020 119 >59 mL/min/1.73 Final    Comment:    **In accordance with recommendations from the NKF-ASN Task force,**   [facility] is in the process of updating its eGFR calculation to the   2021 CKD-EPI creatinine equation that estimates kidney  function   without a race variable.    EGFR (Non-African Amer.)  Date Value Ref Range Status  10/19/2013 >60  Final    Comment:    eGFR values <59m/min/1.73 m2 may be an indication of chronic kidney disease (CKD). Calculated eGFR is useful in patients with stable renal function. The eGFR calculation will not be reliable in acutely ill patients when serum creatinine is changing rapidly. It is not useful in  patients on dialysis. The eGFR calculation may not be applicable to patients at the low and high extremes of body sizes, pregnant women, and vegetarians.    GFR, Estimated  Date Value Ref Range Status  04/24/2021 >60 >60 mL/min Final    Comment:    (NOTE) Calculated using the CKD-EPI Creatinine Equation (2021)    eGFR  Date Value Ref Range Status  11/01/2021 105 >59 mL/min/1.73 Final         Passed - Cr in normal range and within 360 days    Creatinine  Date Value Ref Range Status  10/19/2013 0.53 (L) 0.60 - 1.30 mg/dL Final   Creatinine, Ser  Date Value Ref Range Status  11/01/2021 0.60 0.57 - 1.00 mg/dL Final         Passed - Last BP in normal range    BP Readings from Last 1 Encounters:  01/22/22 122/84         Passed - Valid encounter within last 6 months    Recent Outpatient Visits           1 month ago Moderate persistent asthma with acute exacerbation   [facility]   3 months ago Primary hypertension   [facility] F[facility]   5 months ago OAB (overactive bladder)   [facility]   8 months ago Morbid obesity ([facility]   [facility] F[facility]   1 year ago Osteoarthritis of cervical spine, unspecified spinal osteoarthritis complication status   [facility], FNP              Passed - Weight completed in the last 3 months    Wt Readings from Last 1 Encounters:  01/22/22 231 lb (104.8 kg)

## 2022-02-12 ENCOUNTER — Ambulatory Visit (INDEPENDENT_AMBULATORY_CARE_PROVIDER_SITE_OTHER): Payer: Medicare Other | Admitting: Orthopedic Surgery

## 2022-02-12 ENCOUNTER — Encounter: Payer: Self-pay | Admitting: Orthopedic Surgery

## 2022-02-12 ENCOUNTER — Ambulatory Visit (INDEPENDENT_AMBULATORY_CARE_PROVIDER_SITE_OTHER): Payer: Medicare Other

## 2022-02-12 ENCOUNTER — Telehealth: Payer: Self-pay | Admitting: Physical Medicine and Rehabilitation

## 2022-02-12 VITALS — BP 130/88 | HR 83 | Ht 69.0 in | Wt 231.0 lb

## 2022-02-12 DIAGNOSIS — M5416 Radiculopathy, lumbar region: Secondary | ICD-10-CM

## 2022-02-12 NOTE — Progress Notes (Signed)
Orthopedic Spine Surgery Office Note  Assessment: Patient is a 56 y.o. female with chronic low back pain with exacerbation. Has radicular pain into the posterior aspect of the left leg.    Plan: -Explained that initially conservative treatment is tried as a significant number of patients may experience relief with these treatment modalities. Discussed that the conservative treatments include:  -activity modification  -physical therapy  -over the counter pain medications  -medrol dosepak  -lumbar steroid injections -Patient has tried PT, activity modification, over the counter medications, muscle relaxers, narcotics, steroid injections, oral steroids  -She is due to have a TKA with Dr. Ninfa Linden 03/11/22. Once she has recovered from this, we can figure out her back but she does not need to wait to do her TKA. -She is scheduled to have an EMG/NCS in mid-February, so I want to see her back after that so I can use that information to help dictate what we do next -She has got relief with her last injection and was interested in another so a referral was provided to Dr. Ernestina Patches for a left L1 transforaminal injection (which is what she had last time) -Told her to keep working on weight loss and do core strengthening exercises 3-4/week every week. A referral was provided to her for PT to go over core strengthening -Patient should return to office in 12 weeks, repeat x-rays of lumbar spine at next visit: none   Patient expressed understanding of the plan and all questions were answered to the patient's satisfaction.   ___________________________________________________________________________   History:  Patient is a 56 y.o. female who presents today for lumbar spine. Patient has had years of low back pain. Feels it in her lower back. Had been stable for awhile but within the last month it has gotten significant worse. She feels it everyday and can't do anything without it hurting. It is worse with  flexion through the lumbar spine. She says she is not able to do anything lateral because of this pain. There was no trauma or injury that brought on the pain.     Weakness: yes, feels her left leg is slightly weaker Symptoms of imbalance: yes, chronic Paresthesias and numbness: denies Bowel or bladder incontinence: chronic urinary (since 2018), newer onset of bowel incontinence (last 3 months). Bowel incontinence does not happen often, urinary happens more frequently. Is being worked up by neurology.  Saddle anesthesia: denies  Treatments tried: PT, activity modification, over the counter medications, muscle relaxers, narcotics, steroid injections, oral steroids  Review of systems: Denies fevers and chills, night sweats, unexplained weight loss, history of cancer. Has had pain that wakes her at night in the low back  Past medical history: Depression Anxiety COPD Chronic pain Osteoarthritis Bipolar Asthma  Allergies: NKDA  Past surgical history:  ACDF Cholecystectomy Right ankle surgery Left carpal tunnel release   Social history: Denies use of nicotine product (smoking, vaping, patches, smokeless) Alcohol use: rare Denies recreational drug use   Physical Exam:  General: no acute distress, appears stated age Neurologic: alert, answering questions appropriately, following commands Respiratory: unlabored breathing on room air, symmetric chest rise Psychiatric: appropriate affect, normal cadence to speech   MSK (spine):  -Strength exam      Left  Right EHL    5/5  5/5 TA    5/5  5/5 GSC    5/5  5/5 Knee extension  5/5  5/5 Hip flexion   5/5  5/5  -Sensory exam    Sensation intact to light  touch in L3-S1 nerve distributions of bilateral lower extremities  -Achilles DTR: 1/4 on the left, 1/4 on the right -Patellar tendon DTR: 1/4 on the left, 1/4 on the right -Negative hoffman bilaterally -Normal gait  -Straight leg raise: negative -Contralateral straight  leg raise: negative -Clonus: no beats bilaterally  -Left hip exam: no pain through range of motion, negative stinchfield -Right hip exam: pain with internal rotation otherwise no pain through range of motion, negative stinchfield  Imaging: XR of the lumbar spine from 02/12/2022 was independently reviewed and interpreted, showing disc height loss at L5/S1, L2/3, and L1/2. No evidence of instability. No fracture or dislocation.    Patient name: Megan Jimenez Patient MRN: [id number] Date of visit: 02/12/22

## 2022-02-12 NOTE — Telephone Encounter (Signed)
Pt stated at checkout that Dr. Laurance Flatten is referring her to Dr. Ernestina Patches for cortisone injection in her lower back (Lumbar)... Pt would like to schedule an appt for injection... Pt requesting callback.Marland KitchenMarland Kitchen

## 2022-02-13 ENCOUNTER — Other Ambulatory Visit: Payer: Self-pay | Admitting: Physical Medicine and Rehabilitation

## 2022-02-13 DIAGNOSIS — M5416 Radiculopathy, lumbar region: Secondary | ICD-10-CM

## 2022-02-13 NOTE — Telephone Encounter (Signed)
Spoke with patient and informed her that her referral is in process and we will give her a call once we get it approved through insurance.

## 2022-02-14 ENCOUNTER — Other Ambulatory Visit: Payer: Self-pay | Admitting: Neurology

## 2022-02-19 ENCOUNTER — Ambulatory Visit: Payer: Medicare Other | Admitting: Physical Therapy

## 2022-02-19 ENCOUNTER — Other Ambulatory Visit: Payer: Self-pay

## 2022-02-19 ENCOUNTER — Telehealth: Payer: Self-pay

## 2022-02-19 ENCOUNTER — Encounter: Payer: Self-pay | Admitting: Physical Therapy

## 2022-02-19 DIAGNOSIS — M6281 Muscle weakness (generalized): Secondary | ICD-10-CM

## 2022-02-19 DIAGNOSIS — M5459 Other low back pain: Secondary | ICD-10-CM | POA: Diagnosis not present

## 2022-02-19 NOTE — Therapy (Signed)
OUTPATIENT PHYSICAL THERAPY THORACOLUMBAR EVALUATION   Patient Name: Megan Jimenez MRN: [id number] DOB:1966/01/26, 56 y.o., female Today's Date: 02/19/2022  END OF SESSION:  PT End of Session - 02/19/22 1615     Visit Number 1    Number of Visits 1    Date for PT Re-Evaluation 02/19/22    PT Start Time 1520    PT Stop Time 1605    PT Time Calculation (min) 45 min    Activity Tolerance Patient tolerated treatment well    Behavior During Therapy [facility] for tasks assessed/performed             Past Medical History:  Diagnosis Date   Asthma    Bipolar 1 disorder ([place])    COVID-19 03/29/2021   Family history of breast cancer    Family history of colon cancer    Family history of leukemia    Family history of lung cancer    Family history of ovarian cancer    Family history of throat cancer    Past Surgical History:  Procedure Laterality Date   ANKLE FRACTURE SURGERY Right 1992   ANTERIOR CERVICAL DECOMP/DISCECTOMY FUSION N/A 04/23/2021   Procedure: ANTERIOR CERVICAL DISCECTOMIES AND FUSION C4-5, C5-6 AND C6-7 WITH PLATES, SCREWS, ALLOGRAFT ACDF GRAFT, LOCAL BONE GRAFT, VIVIGEN;  Surgeon: Jessy Oto, MD;  Location: [facility];  Service: Orthopedics;  Laterality: N/A;   CARPAL TUNNEL RELEASE Left 04/23/2021   Procedure: LEFT OPEN CARPAL TUNNEL RELEASE;  Surgeon: Jessy Oto, MD;  Location: [facility];  Service: Orthopedics;  Laterality: Left;   CHOLECYSTECTOMY     COLONOSCOPY WITH PROPOFOL N/A 07/09/2020   Procedure: COLONOSCOPY WITH PROPOFOL;  Surgeon: Jonathon Bellows, MD;  Location: [facility] ENDOSCOPY;  Service: Gastroenterology;  Laterality: N/A;   Patient Active Problem List   Diagnosis Date Noted   Cervical radiculopathy 01/22/2022   Unilateral primary osteoarthritis, right knee 12/25/2021   Prediabetes 11/01/2021   OAB (overactive bladder) 08/22/2021   Vaginal atrophy 08/22/2021   Primary hypertension 08/22/2021   Hyperglycemia 07/24/2021   Morbid obesity ([place]) 06/14/2021    Asthmatic bronchitis , chronic 05/16/2021   Herniated nucleus pulposus, cervical 04/23/2021    Class: Chronic   Spinal stenosis of cervical region 04/23/2021    Class: Chronic   S/P cervical spinal fusion 04/23/2021   Carpal tunnel syndrome, left upper limb 02/28/2021   Genetic testing 12/12/2020   Other spondylosis with radiculopathy, cervical region 11/29/2020   Osteoarthritis of thoracic spine 11/29/2020   Abnormal gait 11/29/2020   Paresthesia 11/29/2020   Need for shingles vaccine 11/29/2020   Need for hepatitis C screening test 11/29/2020   Weakness of both lower extremities 11/29/2020   Muscle spasm of back 11/29/2020   Mild intermittent asthma without complication 85/27/7824   Family history of breast cancer 11/28/2020   Family history of ovarian cancer 11/28/2020   Family history of colon cancer 11/28/2020   Family history of throat cancer 11/28/2020   Family history of lung cancer 11/28/2020   Family history of leukemia 11/28/2020   Allergic rhinitis 11/17/2014   Anxiety 09/08/2014   Acute asthma exacerbation 09/08/2014   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/08/2014   Headache, migraine 09/08/2014   Excess, menstruation 09/08/2014    PCP: Gwyneth Sprout, FNP   REFERRING PROVIDER: Callie Fielding, MD  REFERRING DIAG: M54.16 (ICD-10-CM) - Lumbar radiculopathy  Rationale for Evaluation and Treatment: Rehabilitation  THERAPY DIAG:  Other low back pain  Muscle weakness (generalized)  ONSET DATE: chronic pain  SUBJECTIVE:                                                                                                                                                                                           SUBJECTIVE STATEMENT: She relays chronic pain and DDD, had fusion in her neck and will also need TKA 03/11/22. She says that MD wants her to come to PT to strengthen core and he is considering nerve ablation. Back pain effects her sleeping. Pain sometimes runs down her left  leg. She has had 2 falls and does have some concern about her balance  PERTINENT HISTORY:  Past medical history: Depression Anxiety COPD Chronic pain Osteoarthritis Bipolar Asthma   Allergies: NKDA   Past surgical history:  ACDF Cholecystectomy Right ankle surgery Left carpal tunnel release  PAIN:  Are you having pain? Yes: NPRS scale: 6 currently and does get to 9/10 Pain location: low back, sometimes goes down her left leg Pain description: like electricity going down her leg Aggravating factors: staying in one position too long laying, sitting, standing, picking up things, steps, walking too long Relieving factors: voltaren gel, meloxicam, lyrica, heat  PRECAUTIONS: None  WEIGHT BEARING RESTRICTIONS: No  FALLS:  Has patient fallen in last 6 months? Yes. Number of falls 2  OCCUPATION: disabled  PLOF: Independent  PATIENT GOALS: help with pain, improve strength  NEXT MD VISIT:   OBJECTIVE:   DIAGNOSTIC FINDINGS:  Imaging: XR of the lumbar spine from 02/12/2022 was independently reviewed and interpreted, showing disc height loss at L5/S1, L2/3, and L1/2. No evidence of instability. No fracture or dislocation.  PATIENT SURVEYS:  Eval: did not do back FOTO as she will be having Rt knee TKA soon affecting reliability and validity of this survey.  SCREENING FOR RED FLAGS: Bowel or bladder incontinence: Yes:    COGNITION: Overall cognitive status: Within functional limits for tasks assessed     SENSATION: WFL   POSTURE: flexed trunk   PALPATION:   LUMBAR ROM:   AROM eval  Flexion WNL  Extension 25%  Right lateral flexion 25%  Left lateral flexion 75%  Right rotation 75%  Left rotation 75%   (Blank rows = not tested)  LOWER EXTREMITY ROM:     Active  Right eval Left eval  Hip flexion    Hip extension    Hip abduction    Hip adduction    Hip internal rotation    Hip external rotation    Knee flexion    Knee extension    Ankle  dorsiflexion    Ankle plantarflexion    Ankle inversion    Ankle  eversion     (Blank rows = not tested)  LOWER EXTREMITY MMT:    MMT Right eval Left eval  Hip flexion 4 4  Hip extension    Hip abduction 4 4  Hip adduction    Hip internal rotation    Hip external rotation    Knee flexion 4+ 4+  Knee extension 4+ 4+  Ankle dorsiflexion    Ankle plantarflexion    Ankle inversion    Ankle eversion     (Blank rows = not tested)  LUMBAR SPECIAL TESTS:  Straight leg raise test: Positive and Slump test: Positive    TODAY'S TREATMENT:  Eval HEP creation and review with demonstration  preformed, see below for details Moist heat with IFC TENS therapy X 15 minutes to low back    PATIENT EDUCATION: Education details: HEP, PT plan of care Person educated: Patient Education method: Explanation, Demonstration, Verbal cues, and Handouts Education comprehension: verbalized understanding and needs further education   HOME EXERCISE PROGRAM: Access Code: [email or web address] URL: [email or web address] Date: 02/19/2022 Prepared by: Elsie Ra  Exercises - Standing Lumbar Extension  - 2 x daily - 6 x weekly - 1 sets - 10 reps - 5 hold - Standing Lumbar Spine Flexion Stretch Counter  - 2 x daily - 6 x weekly - 1 sets - 1 reps - 10 hold - Hooklying Isometric Hip Flexion  - 2 x daily - 6 x weekly - 1 sets - 10 reps - 5 sec hold - Supine Bridge  - 2 x daily - 6 x weekly - 1 sets - 10 reps - 5 hold - Standing Row with Anchored Resistance  - 2 x daily - 6 x weekly - 2 sets - 10-20 reps - Shoulder extension with resistance - Neutral  - 2 x daily - 6 x weekly - 2 sets - 10-15 reps - Standing Anti-Rotation Press with Anchored Resistance  - 2 x daily - 6 x weekly - 1 sets - 10 reps  Patient Education - TENS Therapy  ASSESSMENT:  CLINICAL IMPRESSION: Patient referred to PT for chronic low back pain with left side radiculopathy. This will be a one time visit for HEP for core strength  and trial of TENS unit. She will have Rt knee TKA performed on 03/11/22 so then we will see her new PT exam after and can work on her back pain along with knee impairments post op.    OBJECTIVE IMPAIRMENTS: decreased activity tolerance, difficulty walking, decreased balance, decreased endurance, decreased mobility, decreased ROM, decreased strength, impaired flexibility, impaired UE/LE use, postural dysfunction, and pain.  ACTIVITY LIMITATIONS: bending, lifting, carry, locomotion, cleaning, community activity, driving, and or occupation  PERSONAL FACTORS: see above PMH  also affecting patient's functional outcome.  REHAB POTENTIAL: Good  CLINICAL DECISION MAKING: Stable/uncomplicated  EVALUATION COMPLEXITY: Low    GOALS: Short term PT Goals Target date: 02/19/22 Pt will show good understanding of PT plan of care and HEP. MET today  No more goals needed as this is one time visit for now   PLAN: PT FREQUENCY: 1 visit  PT DURATION:   PLANNED INTERVENTIONS (unless contraindicated): aquatic PT, Canalith repositioning, cryotherapy, Electrical stimulation, Iontophoresis with 4 mg/ml dexamethasome, Moist heat, traction, Ultrasound, gait training, Therapeutic exercise, balance training, neuromuscular re-education, patient/family education, prosthetic training, manual techniques, passive ROM, dry needling, taping, vasopnuematic device, vestibular, spinal manipulations, joint manipulations  PLAN FOR NEXT SESSION: one visit for HEP creation and trial of TENS therapy, will begin PT again after  TKA.  Debbe Odea, PT,DPT 02/19/2022, 4:16 PM

## 2022-02-19 NOTE — Telephone Encounter (Signed)
Copied from [place] [id number]. Topic: General - Other >> Feb 19, 2022  9:45 AM Ludger Nutting wrote: Christie Beckers with [facility] did an evaluation on patient and stated that she may be a diabetic and id not currently on a statin medication. Christie Beckers is requesting patient be re-evaluated and drug therapy changed if provider deems it necessary.

## 2022-02-20 NOTE — Telephone Encounter (Signed)
Spoke with patient.

## 2022-02-26 ENCOUNTER — Other Ambulatory Visit: Payer: Self-pay | Admitting: Physician Assistant

## 2022-02-26 DIAGNOSIS — Z01818 Encounter for other preprocedural examination: Secondary | ICD-10-CM

## 2022-02-27 ENCOUNTER — Ambulatory Visit: Payer: Self-pay

## 2022-02-27 ENCOUNTER — Ambulatory Visit (INDEPENDENT_AMBULATORY_CARE_PROVIDER_SITE_OTHER): Payer: Medicare Other | Admitting: Physical Medicine and Rehabilitation

## 2022-02-27 VITALS — BP 121/82 | HR 80

## 2022-02-27 DIAGNOSIS — M5416 Radiculopathy, lumbar region: Secondary | ICD-10-CM | POA: Diagnosis not present

## 2022-02-27 MED ORDER — DEXAMETHASONE SODIUM PHOSPHATE 10 MG/ML IJ SOLN
15.0000 mg | Freq: Once | INTRAMUSCULAR | Status: AC
  Administered 2022-02-27: 15 mg

## 2022-02-27 NOTE — Patient Instructions (Signed)

## 2022-02-27 NOTE — Progress Notes (Signed)
Numeric Pain Rating Scale and Functional Assessment Average Pain 7   In the last MONTH (on 0-10 scale) has pain interfered with the following?  1. General activity like being  able to carry out your everyday physical activities such as walking, climbing stairs, carrying groceries, or moving a chair?  Rating(7)   +Driver, -BT, -Dye Allergies.  Lower back pain with occasional radiation in the left leg

## 2022-02-28 NOTE — Pre-Procedure Instructions (Signed)
Surgical Instructions    Your procedure is scheduled on Tuesday, December 26th.  Report to [facility] Main Entrance "A" at 11:45 A.M., then check in with the Admitting office.  Call this number if you have problems the morning of surgery:  [phone]   If you have any questions prior to your surgery date call [phone]: Open Monday-Friday 8am-4pm    Remember:  Do not eat after midnight the night before your surgery  You may drink clear liquids until 10:45 AM the morning of your surgery.   Clear liquids allowed are: Water, Non-Citrus Juices (without pulp), Carbonated Beverages, Clear Tea, Black Coffee Only (NO MILK, CREAM OR POWDERED CREAMER of any kind), and Gatorade.   Patient Instructions  The night before surgery:  No food after midnight. ONLY clear liquids after midnight   The day of surgery (if you have diabetes): Drink ONE (1) 12 oz G2 given to you in your pre admission testing appointment by 10:45 AM the morning of surgery. Drink in one sitting. Do not sip.  This drink was given to you during your hospital  pre-op appointment visit.  Nothing else to drink after completing the  12 oz bottle of G2.         If you have questions, please contact your surgeon's office.     Take these medicines the morning of surgery with A SIP OF WATER  DULoxetine (CYMBALTA)  fluticasone (FLONASE)  fluticasone furoate-vilanterol (BREO ELLIPTA)  loratadine (CLARITIN)  pregabalin (LYRICA)    If needed: albuterol (PROVENTIL)- if needed, bring with you on day of surgery albuterol (VENTOLIN HFA)  ALPRAZolam (XANAX)  methocarbamol (ROBAXIN)    Phentermine must be stopped 1 week prior to surgery.   As of today, STOP taking any Aspirin (unless otherwise instructed by your surgeon) Aleve, Naproxen, Ibuprofen, Motrin, Advil, Goody's, BC's, all herbal medications, fish oil, and all vitamins. This includes meloxicam (MOBIC).  WHAT DO I DO ABOUT MY DIABETES MEDICATION?   Do not take  metFORMIN (GLUCOPHAGE-XR)  the morning of surgery.    HOW TO MANAGE YOUR DIABETES BEFORE AND AFTER SURGERY  Why is it important to control my blood sugar before and after surgery? Improving blood sugar levels before and after surgery helps healing and can limit problems. A way of improving blood sugar control is eating a healthy diet by:  Eating less sugar and carbohydrates  Increasing activity/exercise  Talking with your doctor about reaching your blood sugar goals High blood sugars (greater than 180 mg/dL) can raise your risk of infections and slow your recovery, so you will need to focus on controlling your diabetes during the weeks before surgery. Make sure that the doctor who takes care of your diabetes knows about your planned surgery including the date and location.  How do I manage my blood sugar before surgery? Check your blood sugar at least 4 times a day, starting 2 days before surgery, to make sure that the level is not too high or low.  Check your blood sugar the morning of your surgery when you wake up and every 2 hours until you get to the Short Stay unit.  If your blood sugar is less than 70 mg/dL, you will need to treat for low blood sugar: Do not take insulin. Treat a low blood sugar (less than 70 mg/dL) with  cup of clear juice (cranberry or apple), 4 glucose tablets, OR glucose gel. Recheck blood sugar in 15 minutes after treatment (to make sure it is greater than  70 mg/dL). If your blood sugar is not greater than 70 mg/dL on recheck, call [phone] for further instructions. Report your blood sugar to the short stay nurse when you get to Short Stay.  If you are admitted to the hospital after surgery: Your blood sugar will be checked by the staff and you will probably be given insulin after surgery (instead of oral diabetes medicines) to make sure you have good blood sugar levels. The goal for blood sugar control after surgery is 80-180 mg/dL.                       Do NOT Smoke (Tobacco/Vaping) for 24 hours prior to your procedure.  If you use a CPAP at night, you may bring your mask/headgear for your overnight stay.   Contacts, glasses, piercing's, hearing aid's, dentures or partials may not be worn into surgery, please bring cases for these belongings.    For patients admitted to the hospital, discharge time will be determined by your treatment team.   Patients discharged the day of surgery will not be allowed to drive home, and someone needs to stay with them for 24 hours.  SURGICAL WAITING ROOM VISITATION Patients having surgery or a procedure may have no more than 2 support people in the waiting area - these visitors may rotate.   Children under the age of 72 must have an adult with them who is not the patient. If the patient needs to stay at the hospital during part of their recovery, the visitor guidelines for inpatient rooms apply. Pre-op nurse will coordinate an appropriate time for 1 support person to accompany patient in pre-op.  This support person may not rotate.   Please refer to the [facility] website for the visitor guidelines for Inpatients (after your surgery is over and you are in a regular room).    Special instructions:   [place]- Preparing For Surgery  Before surgery, you can play an important role. Because skin is not sterile, your skin needs to be as free of germs as possible. You can reduce the number of germs on your skin by washing with CHG (chlorahexidine gluconate) Soap before surgery.  CHG is an antiseptic cleaner which kills germs and bonds with the skin to continue killing germs even after washing.    Oral Hygiene is also important to reduce your risk of infection.  Remember - BRUSH YOUR TEETH THE MORNING OF SURGERY WITH YOUR REGULAR TOOTHPASTE  Please do not use if you have an allergy to CHG or antibacterial soaps. If your skin becomes reddened/irritated stop using the CHG.  Do not shave (including legs and  underarms) for at least 48 hours prior to first CHG shower. It is OK to shave your face.  Please follow these instructions carefully.   Shower the NIGHT BEFORE SURGERY and the MORNING OF SURGERY  If you chose to wash your hair, wash your hair first as usual with your normal shampoo.  After you shampoo, rinse your hair and body thoroughly to remove the shampoo.  Use CHG Soap as you would any other liquid soap. You can apply CHG directly to the skin and wash gently with a scrungie or a clean washcloth.   Apply the CHG Soap to your body ONLY FROM THE NECK DOWN.  Do not use on open wounds or open sores. Avoid contact with your eyes, ears, mouth and genitals (private parts). Wash Face and genitals (private parts)  with your normal soap.  Wash thoroughly, paying special attention to the area where your surgery will be performed.  Thoroughly rinse your body with warm water from the neck down.  DO NOT shower/wash with your normal soap after using and rinsing off the CHG Soap.  Pat yourself dry with a CLEAN TOWEL.  Wear CLEAN PAJAMAS to bed the night before surgery  Place CLEAN SHEETS on your bed the night before your surgery  DO NOT SLEEP WITH PETS.   Day of Surgery: Take a shower with CHG soap. Do not wear jewelry or makeup Do not wear lotions, powders, perfumes, or deodorant. Do not shave 48 hours prior to surgery.   Do not bring valuables to the hospital. [facility] is not responsible for any belongings or valuables. Do not wear nail polish, gel polish, artificial nails, or any other type of covering on natural nails (fingers and toes) If you have artificial nails or gel coating that need to be removed by a nail salon, please have this removed prior to surgery. Artificial nails or gel coating may interfere with anesthesia's ability to adequately monitor your vital signs. Wear Clean/Comfortable clothing the morning of surgery Remember to brush your teeth WITH YOUR REGULAR  TOOTHPASTE.   Please read over the following fact sheets that you were given.    If you received a COVID test during your pre-op visit  it is requested that you wear a mask when out in public, stay away from anyone that may not be feeling well and notify your surgeon if you develop symptoms. If you have been in contact with anyone that has tested positive in the last 10 days please notify you surgeon.

## 2022-03-03 ENCOUNTER — Other Ambulatory Visit: Payer: Self-pay

## 2022-03-03 ENCOUNTER — Encounter (HOSPITAL_COMMUNITY)
Admission: RE | Admit: 2022-03-03 | Discharge: 2022-03-03 | Disposition: A | Discharge status: Home / Self Care | Payer: Medicare Other | Source: Ambulatory Visit | Attending: Orthopaedic Surgery | Admitting: Orthopaedic Surgery

## 2022-03-03 ENCOUNTER — Encounter (HOSPITAL_COMMUNITY): Payer: Self-pay

## 2022-03-03 DIAGNOSIS — Z01818 Encounter for other preprocedural examination: Secondary | ICD-10-CM

## 2022-03-03 DIAGNOSIS — Z01812 Encounter for preprocedural laboratory examination: Secondary | ICD-10-CM | POA: Insufficient documentation

## 2022-03-03 DIAGNOSIS — R7303 Prediabetes: Secondary | ICD-10-CM | POA: Insufficient documentation

## 2022-03-03 HISTORY — DX: Unspecified osteoarthritis, unspecified site: M19.90

## 2022-03-03 HISTORY — DX: Prediabetes: R73.03

## 2022-03-03 HISTORY — DX: Anxiety disorder, unspecified: F41.9

## 2022-03-03 LAB — COMPREHENSIVE METABOLIC PANEL
ALT: 23 U/L (ref 0–44)
AST: 17 U/L (ref 15–41)
Albumin: 3.5 g/dL (ref 3.5–5.0)
Alkaline Phosphatase: 48 U/L (ref 38–126)
Anion gap: 8 (ref 5–15)
BUN: 18 mg/dL (ref 6–20)
CO2: 28 mmol/L (ref 22–32)
Calcium: 9.3 mg/dL (ref 8.9–10.3)
Chloride: 101 mmol/L (ref 98–111)
Creatinine, Ser: 0.61 mg/dL (ref 0.44–1.00)
GFR, Estimated: >60 mL/min (ref >60)
Glucose, Bld: 94 mg/dL (ref 70–99)
Potassium: 4.5 mmol/L (ref 3.5–5.1)
Sodium: 137 mmol/L (ref 135–145)
Total Bilirubin: 0.5 mg/dL (ref 0.3–1.2)
Total Protein: 5.9 g/dL — ABNORMAL LOW (ref 6.5–8.1)

## 2022-03-03 LAB — CBC
HCT: 39.3 % (ref 36.0–46.0)
Hemoglobin: 12.6 g/dL (ref 12.0–15.0)
MCH: 30.9 pg (ref 26.0–34.0)
MCHC: 32.1 g/dL (ref 30.0–36.0)
MCV: 96.3 fL (ref 80.0–100.0)
Platelets: 218 10*3/uL (ref 150–400)
RBC: 4.08 MIL/uL (ref 3.87–5.11)
RDW: 13 % (ref 11.5–15.5)
WBC: 6.8 10*3/uL (ref 4.0–10.5)
nRBC: 0 % (ref 0.0–0.2)

## 2022-03-03 LAB — TYPE AND SCREEN
ABO/RH(D): A POS
Antibody Screen: NEGATIVE

## 2022-03-03 LAB — SURGICAL PCR SCREEN
MRSA, PCR: NEGATIVE
Staphylococcus aureus: NEGATIVE

## 2022-03-03 MED ORDER — MELOXICAM 15 MG PO TABS: 15.0000 mg | ORAL_TABLET | Freq: Every day | ORAL | 2 refills | Status: DC

## 2022-03-03 NOTE — Progress Notes (Signed)
PCP - Tally Joe, FNP Cardiologist - denies  PPM/ICD - denies   Chest x-ray - 12/30/21 EKG - 03/29/21 Stress Test - denies ECHO - denies Cardiac Cath - denies  Sleep Study - denies   DM- pre-diabetic No CBG checks  Last dose of GLP1 agonist-  n/a   ASA/Blood Thinner Instructions: n/a   ERAS Protcol - yes PRE-SURGERY G2- given at PAT  COVID TEST- n/a   Anesthesia review: no  Patient denies shortness of breath, fever, cough and chest pain at PAT appointment   All instructions explained to the patient, with a verbal understanding of the material. Patient agrees to go over the instructions while at home for a better understanding. The opportunity to ask questions was provided.

## 2022-03-05 NOTE — Progress Notes (Signed)
Megan Jimenez - 56 y.o. female MRN [id number]  Date of birth: 10-Mar-1966  Office Visit Note: Visit Date: 02/27/2022 PCP: Gwyneth Sprout, FNP Referred by: Callie Fielding, MD  Subjective: Chief Complaint  Patient presents with   Lower Back - Pain   HPI:  Megan Jimenez is a 56 y.o. female who comes in today at the request of Dr. Ileene Rubens for planned Left L1-2 Lumbar Transforaminal epidural steroid injection with fluoroscopic guidance.  The patient has failed conservative care including home exercise, medications, time and activity modification.  This injection will be diagnostic and hopefully therapeutic.  Please see requesting physician notes for further details and justification.   ROS Otherwise per HPI.  Assessment & Plan: Visit Diagnoses:    ICD-10-CM   1. Lumbar radiculopathy  M54.16 XR C-ARM NO REPORT    Epidural Steroid injection    dexamethasone (DECADRON) injection 15 mg      Plan: No additional findings.   Meds & Orders:  Meds ordered this encounter  Medications   dexamethasone (DECADRON) injection 15 mg    Orders Placed This Encounter  Procedures   XR C-ARM NO REPORT   Epidural Steroid injection    Follow-up: Return for visit to requesting provider as needed.   Procedures: No procedures performed  Lumbosacral Transforaminal Epidural Steroid Injection - Sub-Pedicular Approach with Fluoroscopic Guidance  Patient: Megan Jimenez      Date of Birth: 06-Oct-1965 MRN: [id number] PCP: Gwyneth Sprout, FNP      Visit Date: 02/27/2022   Universal Protocol:    Date/Time: 02/27/2022  Consent Given By: the patient  Position: PRONE  Additional Comments: Vital signs were monitored before and after the procedure. Patient was prepped and draped in the usual sterile fashion. The correct patient, procedure, and site was verified.   Injection Procedure Details:   Procedure diagnoses: Lumbar radiculopathy [M54.16]    Meds Administered:  Meds ordered  this encounter  Medications   dexamethasone (DECADRON) injection 15 mg    Laterality: Left  Location/Site: L1  Needle:5.0 in., 22 ga.  Short bevel or Quincke spinal needle  Needle Placement: Transforaminal  Findings:    -Comments: Excellent flow of contrast along the nerve, nerve root and into the epidural space.  Procedure Details: After squaring off the end-plates to get a true AP view, the C-arm was positioned so that an oblique view of the foramen as noted above was visualized. The target area is just inferior to the "nose of the scotty dog" or sub pedicular. The soft tissues overlying this structure were infiltrated with 2-3 ml. of 1% Lidocaine without Epinephrine.  The spinal needle was inserted toward the target using a "trajectory" view along the fluoroscope beam.  Under AP and lateral visualization, the needle was advanced so it did not puncture dura and was located close the 6 O'Clock position of the pedical in AP tracterory. Biplanar projections were used to confirm position. Aspiration was confirmed to be negative for CSF and/or blood. A 1-2 ml. volume of Isovue-250 was injected and flow of contrast was noted at each level. Radiographs were obtained for documentation purposes.   After attaining the desired flow of contrast documented above, a 0.5 to 1.0 ml test dose of 0.25% Marcaine was injected into each respective transforaminal space.  The patient was observed for 90 seconds post injection.  After no sensory deficits were reported, and normal lower extremity motor function was noted,   the above injectate was administered so  that equal amounts of the injectate were placed at each foramen (level) into the transforaminal epidural space.   Additional Comments:  The patient tolerated the procedure well Dressing: 2 x 2 sterile gauze and Band-Aid    Post-procedure details: Patient was observed during the procedure. Post-procedure instructions were reviewed.  Patient left  the clinic in stable condition.    Clinical History: No specialty comments available.     Objective:  VS:  HT:    WT:   BMI:     BP:121/82  HR:80bpm  TEMP: ( )  RESP:  Physical Exam Vitals and nursing note reviewed.  Constitutional:      General: She is not in acute distress.    Appearance: Normal appearance. She is not ill-appearing.  HENT:     Head: Normocephalic and atraumatic.     Right Ear: External ear normal.     Left Ear: External ear normal.  Eyes:     Extraocular Movements: Extraocular movements intact.  Cardiovascular:     Rate and Rhythm: Normal rate.     Pulses: Normal pulses.  Pulmonary:     Effort: Pulmonary effort is normal. No respiratory distress.  Abdominal:     General: There is no distension.     Palpations: Abdomen is soft.  Musculoskeletal:        General: Tenderness present.     Cervical back: Neck supple.     Right lower leg: No edema.     Left lower leg: No edema.     Comments: Patient has good distal strength with no pain over the greater trochanters.  No clonus or focal weakness.  Skin:    Findings: No erythema, lesion or rash.  Neurological:     General: No focal deficit present.     Mental Status: She is alert and oriented to person, place, and time.     Sensory: No sensory deficit.     Motor: No weakness or abnormal muscle tone.     Coordination: Coordination normal.  Psychiatric:        Mood and Affect: Mood normal.        Behavior: Behavior normal.      Imaging: No results found.

## 2022-03-05 NOTE — Procedures (Signed)
Lumbosacral Transforaminal Epidural Steroid Injection - Sub-Pedicular Approach with Fluoroscopic Guidance  Patient: Megan Jimenez      Date of Birth: 09-Aug-1965 MRN: [id number] PCP: Gwyneth Sprout, FNP      Visit Date: 02/27/2022   Universal Protocol:    Date/Time: 02/27/2022  Consent Given By: the patient  Position: PRONE  Additional Comments: Vital signs were monitored before and after the procedure. Patient was prepped and draped in the usual sterile fashion. The correct patient, procedure, and site was verified.   Injection Procedure Details:   Procedure diagnoses: Lumbar radiculopathy [M54.16]    Meds Administered:  Meds ordered this encounter  Medications   dexamethasone (DECADRON) injection 15 mg    Laterality: Left  Location/Site: L1  Needle:5.0 in., 22 ga.  Short bevel or Quincke spinal needle  Needle Placement: Transforaminal  Findings:    -Comments: Excellent flow of contrast along the nerve, nerve root and into the epidural space.  Procedure Details: After squaring off the end-plates to get a true AP view, the C-arm was positioned so that an oblique view of the foramen as noted above was visualized. The target area is just inferior to the "nose of the scotty dog" or sub pedicular. The soft tissues overlying this structure were infiltrated with 2-3 ml. of 1% Lidocaine without Epinephrine.  The spinal needle was inserted toward the target using a "trajectory" view along the fluoroscope beam.  Under AP and lateral visualization, the needle was advanced so it did not puncture dura and was located close the 6 O'Clock position of the pedical in AP tracterory. Biplanar projections were used to confirm position. Aspiration was confirmed to be negative for CSF and/or blood. A 1-2 ml. volume of Isovue-250 was injected and flow of contrast was noted at each level. Radiographs were obtained for documentation purposes.   After attaining the desired flow of contrast  documented above, a 0.5 to 1.0 ml test dose of 0.25% Marcaine was injected into each respective transforaminal space.  The patient was observed for 90 seconds post injection.  After no sensory deficits were reported, and normal lower extremity motor function was noted,   the above injectate was administered so that equal amounts of the injectate were placed at each foramen (level) into the transforaminal epidural space.   Additional Comments:  The patient tolerated the procedure well Dressing: 2 x 2 sterile gauze and Band-Aid    Post-procedure details: Patient was observed during the procedure. Post-procedure instructions were reviewed.  Patient left the clinic in stable condition.

## 2022-03-07 ENCOUNTER — Telehealth: Payer: Self-pay | Admitting: *Deleted

## 2022-03-07 NOTE — Telephone Encounter (Signed)
Ortho bundle pre-op call attempted; no answer and left VM requesting call back.

## 2022-03-10 NOTE — H&P (Signed)
TOTAL KNEE ADMISSION H&P  Patient is being admitted for right total knee arthroplasty.  Subjective:  Chief Complaint:right knee pain.  HPI: Megan Jimenez, 56 y.o. female, has a history of pain and functional disability in the right knee due to arthritis and has failed non-surgical conservative treatments for greater than 12 weeks to includeNSAID's and/or analgesics, corticosteriod injections, flexibility and strengthening excercises, supervised PT with diminished ADL's post treatment, use of assistive devices, weight reduction as appropriate, and activity modification.  Onset of symptoms was gradual, starting 1 years ago with gradually worsening course since that time. The patient noted no past surgery on the right knee(s).  Patient currently rates pain in the right knee(s) at 10 out of 10 with activity. Patient has night pain, worsening of pain with activity and weight bearing, pain that interferes with activities of daily living, pain with passive range of motion, crepitus, and joint swelling.  Patient has evidence of subchondral sclerosis, periarticular osteophytes, and joint space narrowing by imaging studies. There is no active infection.  Patient Active Problem List   Diagnosis Date Noted   Cervical radiculopathy 01/22/2022   Unilateral primary osteoarthritis, right knee 12/25/2021   Prediabetes 11/01/2021   OAB (overactive bladder) 08/22/2021   Vaginal atrophy 08/22/2021   Primary hypertension 08/22/2021   Hyperglycemia 07/24/2021   Morbid obesity ([place]) 06/14/2021   Asthmatic bronchitis , chronic 05/16/2021   Herniated nucleus pulposus, cervical 04/23/2021   Spinal stenosis of cervical region 04/23/2021   S/P cervical spinal fusion 04/23/2021   Carpal tunnel syndrome, left upper limb 02/28/2021   Genetic testing 12/12/2020   Other spondylosis with radiculopathy, cervical region 11/29/2020   Osteoarthritis of thoracic spine 11/29/2020   Abnormal gait 11/29/2020   Paresthesia  11/29/2020   Need for shingles vaccine 11/29/2020   Need for hepatitis C screening test 11/29/2020   Weakness of both lower extremities 11/29/2020   Muscle spasm of back 11/29/2020   Mild intermittent asthma without complication 58/85/0277   Family history of breast cancer 11/28/2020   Family history of ovarian cancer 11/28/2020   Family history of colon cancer 11/28/2020   Family history of throat cancer 11/28/2020   Family history of lung cancer 11/28/2020   Family history of leukemia 11/28/2020   Allergic rhinitis 11/17/2014   Anxiety 09/08/2014   Acute asthma exacerbation 09/08/2014   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/08/2014   Headache, migraine 09/08/2014   Excess, menstruation 09/08/2014   Past Medical History:  Diagnosis Date   Anxiety    Arthritis    Asthma    Bipolar 1 disorder ([place])    COVID-19 03/29/2021   Family history of breast cancer    Family history of colon cancer    Family history of leukemia    Family history of lung cancer    Family history of ovarian cancer    Family history of throat cancer    Pre-diabetes     Past Surgical History:  Procedure Laterality Date   ANKLE FRACTURE SURGERY Right 1992   ANTERIOR CERVICAL DECOMP/DISCECTOMY FUSION N/A 04/23/2021   Procedure: ANTERIOR CERVICAL DISCECTOMIES AND FUSION C4-5, C5-6 AND C6-7 WITH PLATES, SCREWS, ALLOGRAFT ACDF GRAFT, LOCAL BONE GRAFT, VIVIGEN;  Surgeon: Jessy Oto, MD;  Location: [facility];  Service: Orthopedics;  Laterality: N/A;   CARPAL TUNNEL RELEASE Left 04/23/2021   Procedure: LEFT OPEN CARPAL TUNNEL RELEASE;  Surgeon: Jessy Oto, MD;  Location: [facility];  Service: Orthopedics;  Laterality: Left;   CHOLECYSTECTOMY     COLONOSCOPY WITH  PROPOFOL N/A 07/09/2020   Procedure: COLONOSCOPY WITH PROPOFOL;  Surgeon: Jonathon Bellows, MD;  Location: [facility];  Service: Gastroenterology;  Laterality: N/A;    No current facility-administered medications for this encounter.   Current Outpatient Medications   Medication Sig Dispense Refill Last Dose   albuterol (PROVENTIL) (2.5 MG/3ML) 0.083% nebulizer solution Take 3 mLs (2.5 mg total) by nebulization every 6 (six) hours as needed for wheezing or shortness of breath. 75 mL 0    albuterol (VENTOLIN HFA) 108 (90 Base) MCG/ACT inhaler TAKE 2 PUFFS BY MOUTH EVERY 4 HOURS AS NEEDED FOR COUGH OR WHEEZING 8.5 each 0    ALPRAZolam (XANAX) 1 MG tablet Take 1 mg by mouth 4 (four) times daily as needed for anxiety.      Ascorbic Acid (VITAMIN C) 1000 MG tablet Take 1,000 mg by mouth daily.      Calcium Carb-Cholecalciferol (CALCIUM/VITAMIN D PO) Take 1 tablet by mouth daily.      CAPLYTA 42 MG CAPS Take 42 mg by mouth at bedtime.      DULoxetine (CYMBALTA) 60 MG capsule Take 1 capsule (60 mg total) by mouth daily. 90 capsule 3    Eszopiclone 3 MG TABS Take 1 tablet (3 mg total) by mouth at bedtime. (Patient taking differently: Take 3 mg by mouth at bedtime as needed (sleep).) 90 tablet 1    ferrous sulfate 325 (65 FE) MG tablet Take 325 mg by mouth daily with breakfast.      fluticasone (FLONASE) 50 MCG/ACT nasal spray SPRAY 2 SPRAYS INTO EACH NOSTRIL EVERY DAY 48 mL 3    fluticasone furoate-vilanterol (BREO ELLIPTA) 200-25 MCG/ACT AEPB Inhale 1 puff into the lungs daily.      loratadine (CLARITIN) 10 MG tablet Take 10 mg by mouth daily.      metFORMIN (GLUCOPHAGE-XR) 750 MG 24 hr tablet TAKE 1 TABLET (750 MG TOTAL) BY MOUTH 2 (TWO) TIMES DAILY AFTER A MEAL. 180 tablet 0    methocarbamol (ROBAXIN) 500 MG tablet TAKE 1 TABLET BY MOUTH EVERY 6 HOURS AS NEEDED FOR MUSCLE SPASMS. 50 tablet 1    montelukast (SINGULAIR) 10 MG tablet TAKE 1 TABLET BY MOUTH EVERYDAY AT BEDTIME 90 tablet 3    Multiple Vitamin (MULTIVITAMIN WITH MINERALS) TABS tablet Take 1 tablet by mouth daily.      Omega-3 Fatty Acids (FISH OIL PO) Take 1 capsule by mouth daily.      pregabalin (LYRICA) 100 MG capsule Take 1 capsule (100 mg total) by mouth 3 (three) times daily. 90 capsule 5     vitamin B-12 (CYANOCOBALAMIN) 500 MCG tablet Take 500 mcg by mouth daily.      Vitamin D, Ergocalciferol, (DRISDOL) 50000 units CAPS capsule Take 50,000 Units by mouth every Friday.   5    DULoxetine (CYMBALTA) 30 MG capsule TAKE 1 CAPSULE BY MOUTH EVERY DAY (Patient not taking: Reported on 02/27/2022) 90 capsule 1 Not Taking   meloxicam (MOBIC) 15 MG tablet Take 1 tablet (15 mg total) by mouth daily. 30 tablet 2    phentermine 37.5 MG capsule Take 1 capsule (37.5 mg total) by mouth every morning. 90 capsule 0    No Known Allergies  Social History   Tobacco Use   Smoking status: Former    Packs/day: 0.50    Years: 25.00    Total pack years: 12.50    Types: Cigarettes    Quit date: 03/17/2004    Years since quitting: 17.9   Smokeless tobacco: Never  Substance Use Topics   Alcohol use: Not Currently    Family History  Problem Relation Age of Onset   COPD Mother    Hyperlipidemia Mother    Lung cancer Mother 46   Hyperlipidemia Father    Leukemia Father 58   COPD Father    Depression Sister        ANXIETY   Healthy Sister    Breast cancer Maternal Aunt        dx 34s, recurrence 46s   Throat cancer Maternal Uncle    Lung cancer Paternal Uncle    Colon cancer Maternal Grandmother    Ovarian cancer Cousin 10       paternal   Ovarian cancer Cousin 36     Review of Systems  Objective:  Physical Exam Vitals reviewed.  Constitutional:      Appearance: Normal appearance.  HENT:     Head: Normocephalic and atraumatic.  Eyes:     Extraocular Movements: Extraocular movements intact.     Pupils: Pupils are equal, round, and reactive to light.  Cardiovascular:     Rate and Rhythm: Normal rate and regular rhythm.     Pulses: Normal pulses.  Pulmonary:     Effort: Pulmonary effort is normal.     Breath sounds: Normal breath sounds.  Abdominal:     Palpations: Abdomen is soft.  Musculoskeletal:     Cervical back: Normal range of motion and neck supple.     Right knee: Bony  tenderness and crepitus present. Decreased range of motion. Tenderness present over the medial joint line, lateral joint line and patellar tendon. Abnormal alignment.  Neurological:     Mental Status: She is alert and oriented to person, place, and time.  Psychiatric:        Behavior: Behavior normal.     Vital signs in last 24 hours:    Labs:   Estimated body mass index is 33.88 kg/m as calculated from the following:   Height as of 03/03/22: '5\' 9"'$  (1.753 m).   Weight as of 03/03/22: 104.1 kg.   Imaging Review Plain radiographs demonstrate severe degenerative joint disease of the right knee(s). The overall alignment ismild valgus. The bone quality appears to be good for age and reported activity level.      Assessment/Plan:  End stage arthritis, right knee   The patient history, physical examination, clinical judgment of the provider and imaging studies are consistent with end stage degenerative joint disease of the right knee(s) and total knee arthroplasty is deemed medically necessary. The treatment options including medical management, injection therapy arthroscopy and arthroplasty were discussed at length. The risks and benefits of total knee arthroplasty were presented and reviewed. The risks due to aseptic loosening, infection, stiffness, patella tracking problems, thromboembolic complications and other imponderables were discussed. The patient acknowledged the explanation, agreed to proceed with the plan and consent was signed. Patient is being admitted for inpatient treatment for surgery, pain control, PT, OT, prophylactic antibiotics, VTE prophylaxis, progressive ambulation and ADL's and discharge planning. The patient is planning to be discharged home with home health services

## 2022-03-11 ENCOUNTER — Observation Stay (HOSPITAL_COMMUNITY)
Admission: RE | Admit: 2022-03-11 | Discharge: 2022-03-12 | Disposition: A | Discharge status: Home / Self Care | Payer: Medicare Other | Source: Ambulatory Visit | Attending: Orthopaedic Surgery | Admitting: Orthopaedic Surgery

## 2022-03-11 ENCOUNTER — Ambulatory Visit (HOSPITAL_COMMUNITY): Payer: Medicare Other | Admitting: Certified Registered Nurse Anesthetist

## 2022-03-11 ENCOUNTER — Encounter (HOSPITAL_COMMUNITY): Payer: Self-pay | Admitting: Orthopaedic Surgery

## 2022-03-11 ENCOUNTER — Observation Stay (HOSPITAL_COMMUNITY): Payer: Medicare Other

## 2022-03-11 ENCOUNTER — Ambulatory Visit (HOSPITAL_BASED_OUTPATIENT_CLINIC_OR_DEPARTMENT_OTHER): Payer: Medicare Other | Admitting: Certified Registered Nurse Anesthetist

## 2022-03-11 ENCOUNTER — Encounter (HOSPITAL_COMMUNITY)
Admission: RE | Disposition: A | Discharge status: Home / Self Care | Payer: Self-pay | Source: Ambulatory Visit | Attending: Orthopaedic Surgery

## 2022-03-11 DIAGNOSIS — Z7952 Long term (current) use of systemic steroids: Secondary | ICD-10-CM | POA: Diagnosis not present

## 2022-03-11 DIAGNOSIS — J45909 Unspecified asthma, uncomplicated: Secondary | ICD-10-CM | POA: Diagnosis not present

## 2022-03-11 DIAGNOSIS — Z8616 Personal history of COVID-19: Secondary | ICD-10-CM | POA: Diagnosis not present

## 2022-03-11 DIAGNOSIS — Z79899 Other long term (current) drug therapy: Secondary | ICD-10-CM | POA: Insufficient documentation

## 2022-03-11 DIAGNOSIS — Z96651 Presence of right artificial knee joint: Secondary | ICD-10-CM | POA: Diagnosis not present

## 2022-03-11 DIAGNOSIS — Z7984 Long term (current) use of oral hypoglycemic drugs: Secondary | ICD-10-CM | POA: Diagnosis not present

## 2022-03-11 DIAGNOSIS — M1711 Unilateral primary osteoarthritis, right knee: Principal | ICD-10-CM | POA: Insufficient documentation

## 2022-03-11 DIAGNOSIS — G8918 Other acute postprocedural pain: Secondary | ICD-10-CM | POA: Diagnosis not present

## 2022-03-11 DIAGNOSIS — Z96659 Presence of unspecified artificial knee joint: Secondary | ICD-10-CM

## 2022-03-11 DIAGNOSIS — Z87891 Personal history of nicotine dependence: Secondary | ICD-10-CM | POA: Insufficient documentation

## 2022-03-11 DIAGNOSIS — Z471 Aftercare following joint replacement surgery: Secondary | ICD-10-CM | POA: Diagnosis not present

## 2022-03-11 HISTORY — PX: TOTAL KNEE ARTHROPLASTY: SHX125

## 2022-03-11 LAB — GLUCOSE, CAPILLARY: Glucose-Capillary: 107 mg/dL — ABNORMAL HIGH (ref 70–99)

## 2022-03-11 SURGERY — ARTHROPLASTY, KNEE, TOTAL
Anesthesia: Spinal | Site: Knee | Laterality: Right

## 2022-03-11 MED ORDER — PHENYLEPHRINE HCL-NACL 20-0.9 MG/250ML-% IV SOLN
INTRAVENOUS | Status: DC | PRN
  Administered 2022-03-11: 20 ug/min via INTRAVENOUS

## 2022-03-11 MED ORDER — ACETAMINOPHEN 500 MG PO TABS
1000.0000 mg | ORAL_TABLET | Freq: Once | ORAL | Status: DC
  Filled 2022-03-11: qty 2

## 2022-03-11 MED ORDER — OXYCODONE HCL 5 MG/5ML PO SOLN: 5.0000 mg | Freq: Once | ORAL | Status: DC | PRN

## 2022-03-11 MED ORDER — ALBUTEROL SULFATE (2.5 MG/3ML) 0.083% IN NEBU: 2.5000 mg | INHALATION_SOLUTION | Freq: Four times a day (QID) | RESPIRATORY_TRACT | Status: DC | PRN

## 2022-03-11 MED ORDER — METOCLOPRAMIDE HCL 5 MG/ML IJ SOLN: 5.0000 mg | Freq: Three times a day (TID) | INTRAMUSCULAR | Status: DC | PRN

## 2022-03-11 MED ORDER — FLUTICASONE FUROATE-VILANTEROL 200-25 MCG/ACT IN AEPB
1.0000 | INHALATION_SPRAY | Freq: Every day | RESPIRATORY_TRACT | Status: DC
  Administered 2022-03-12: 1 via RESPIRATORY_TRACT
  Filled 2022-03-11: qty 28

## 2022-03-11 MED ORDER — METHOCARBAMOL 500 MG PO TABS
500.0000 mg | ORAL_TABLET | Freq: Four times a day (QID) | ORAL | Status: DC | PRN
  Administered 2022-03-11 – 2022-03-12 (×2): 500 mg via ORAL
  Filled 2022-03-11 (×2): qty 1

## 2022-03-11 MED ORDER — HYDROMORPHONE HCL 2 MG PO TABS
2.0000 mg | ORAL_TABLET | ORAL | Status: DC | PRN
  Administered 2022-03-11 – 2022-03-12 (×3): 3 mg via ORAL
  Administered 2022-03-12 (×2): 2 mg via ORAL
  Filled 2022-03-11: qty 2
  Filled 2022-03-11: qty 1
  Filled 2022-03-11: qty 2
  Filled 2022-03-11: qty 1
  Filled 2022-03-11: qty 2

## 2022-03-11 MED ORDER — CEFAZOLIN SODIUM-DEXTROSE 2-4 GM/100ML-% IV SOLN
2.0000 g | INTRAVENOUS | Status: AC
  Administered 2022-03-11: 2 g via INTRAVENOUS
  Filled 2022-03-11: qty 100

## 2022-03-11 MED ORDER — TRANEXAMIC ACID-NACL 1000-0.7 MG/100ML-% IV SOLN
1000.0000 mg | INTRAVENOUS | Status: AC
  Administered 2022-03-11: 1000 mg via INTRAVENOUS
  Filled 2022-03-11: qty 100

## 2022-03-11 MED ORDER — LACTATED RINGERS IV SOLN: INTRAVENOUS | Status: DC

## 2022-03-11 MED ORDER — MONTELUKAST SODIUM 10 MG PO TABS
10.0000 mg | ORAL_TABLET | Freq: Every day | ORAL | Status: DC
  Administered 2022-03-11: 10 mg via ORAL
  Filled 2022-03-11: qty 1

## 2022-03-11 MED ORDER — POLYETHYLENE GLYCOL 3350 17 G PO PACK: 17.0000 g | PACK | Freq: Every day | ORAL | Status: DC | PRN

## 2022-03-11 MED ORDER — LORATADINE 10 MG PO TABS
10.0000 mg | ORAL_TABLET | Freq: Every day | ORAL | Status: DC
  Administered 2022-03-12: 10 mg via ORAL
  Filled 2022-03-11: qty 1

## 2022-03-11 MED ORDER — SODIUM CHLORIDE 0.9 % IV SOLN: INTRAVENOUS | Status: DC

## 2022-03-11 MED ORDER — FENTANYL CITRATE (PF) 100 MCG/2ML IJ SOLN
INTRAMUSCULAR | Status: AC
  Filled 2022-03-11: qty 2

## 2022-03-11 MED ORDER — PHENYLEPHRINE 80 MCG/ML (10ML) SYRINGE FOR IV PUSH (FOR BLOOD PRESSURE SUPPORT)
PREFILLED_SYRINGE | INTRAVENOUS | Status: DC | PRN
  Administered 2022-03-11: 80 ug via INTRAVENOUS

## 2022-03-11 MED ORDER — CEFAZOLIN SODIUM-DEXTROSE 1-4 GM/50ML-% IV SOLN
1.0000 g | Freq: Four times a day (QID) | INTRAVENOUS | Status: AC
  Administered 2022-03-11 – 2022-03-12 (×2): 1 g via INTRAVENOUS
  Filled 2022-03-11 (×2): qty 50

## 2022-03-11 MED ORDER — POVIDONE-IODINE 10 % EX SWAB
2.0000 | Freq: Once | CUTANEOUS | Status: AC
  Administered 2022-03-11: 2 via TOPICAL

## 2022-03-11 MED ORDER — PROPOFOL 500 MG/50ML IV EMUL
INTRAVENOUS | Status: DC | PRN
  Administered 2022-03-11: 100 ug/kg/min via INTRAVENOUS
  Administered 2022-03-11: 150 ug/kg/min via INTRAVENOUS

## 2022-03-11 MED ORDER — ORAL CARE MOUTH RINSE: 15.0000 mL | Freq: Once | OROMUCOSAL | Status: AC

## 2022-03-11 MED ORDER — SODIUM CHLORIDE 0.9 % IR SOLN
Status: DC | PRN
  Administered 2022-03-11: 1000 mL

## 2022-03-11 MED ORDER — OXYCODONE HCL 5 MG PO TABS
5.0000 mg | ORAL_TABLET | ORAL | Status: DC | PRN
  Administered 2022-03-12: 10 mg via ORAL
  Filled 2022-03-11 (×2): qty 2

## 2022-03-11 MED ORDER — DOCUSATE SODIUM 100 MG PO CAPS
100.0000 mg | ORAL_CAPSULE | Freq: Two times a day (BID) | ORAL | Status: DC
  Administered 2022-03-11 – 2022-03-12 (×2): 100 mg via ORAL
  Filled 2022-03-11 (×2): qty 1

## 2022-03-11 MED ORDER — FERROUS SULFATE 325 (65 FE) MG PO TABS
325.0000 mg | ORAL_TABLET | Freq: Every day | ORAL | Status: DC
  Administered 2022-03-12: 325 mg via ORAL
  Filled 2022-03-11: qty 1

## 2022-03-11 MED ORDER — PANTOPRAZOLE SODIUM 40 MG PO TBEC
40.0000 mg | DELAYED_RELEASE_TABLET | Freq: Every day | ORAL | Status: DC
  Administered 2022-03-11 – 2022-03-12 (×2): 40 mg via ORAL
  Filled 2022-03-11 (×2): qty 1

## 2022-03-11 MED ORDER — METHOCARBAMOL 1000 MG/10ML IJ SOLN
500.0000 mg | Freq: Four times a day (QID) | INTRAVENOUS | Status: DC | PRN
  Filled 2022-03-11: qty 5

## 2022-03-11 MED ORDER — FENTANYL CITRATE (PF) 100 MCG/2ML IJ SOLN: 50.0000 ug | Freq: Once | INTRAMUSCULAR | Status: AC

## 2022-03-11 MED ORDER — DIPHENHYDRAMINE HCL 12.5 MG/5ML PO ELIX: 12.5000 mg | ORAL_SOLUTION | ORAL | Status: DC | PRN

## 2022-03-11 MED ORDER — ACETAMINOPHEN 325 MG PO TABS
325.0000 mg | ORAL_TABLET | Freq: Four times a day (QID) | ORAL | Status: DC | PRN
  Administered 2022-03-11: 650 mg via ORAL
  Filled 2022-03-11: qty 2

## 2022-03-11 MED ORDER — DULOXETINE HCL 30 MG PO CPEP
60.0000 mg | ORAL_CAPSULE | Freq: Every day | ORAL | Status: DC
  Administered 2022-03-12: 60 mg via ORAL
  Filled 2022-03-11: qty 2

## 2022-03-11 MED ORDER — PROPOFOL 10 MG/ML IV BOLUS
INTRAVENOUS | Status: DC | PRN
  Administered 2022-03-11 (×3): 20 mg via INTRAVENOUS
  Administered 2022-03-11: 40 mg via INTRAVENOUS

## 2022-03-11 MED ORDER — ASPIRIN 81 MG PO CHEW
81.0000 mg | CHEWABLE_TABLET | Freq: Two times a day (BID) | ORAL | Status: DC
  Administered 2022-03-11 – 2022-03-12 (×2): 81 mg via ORAL
  Filled 2022-03-11 (×2): qty 1

## 2022-03-11 MED ORDER — ONDANSETRON HCL 4 MG PO TABS: 4.0000 mg | ORAL_TABLET | Freq: Four times a day (QID) | ORAL | Status: DC | PRN

## 2022-03-11 MED ORDER — ACETAMINOPHEN 500 MG PO TABS
1000.0000 mg | ORAL_TABLET | Freq: Once | ORAL | Status: AC
  Administered 2022-03-11: 1000 mg via ORAL

## 2022-03-11 MED ORDER — VITAMIN C 500 MG PO TABS
1000.0000 mg | ORAL_TABLET | Freq: Every day | ORAL | Status: DC
  Administered 2022-03-11 – 2022-03-12 (×2): 1000 mg via ORAL
  Filled 2022-03-11 (×2): qty 2

## 2022-03-11 MED ORDER — OXYCODONE HCL 5 MG PO TABS: 5.0000 mg | ORAL_TABLET | Freq: Once | ORAL | Status: DC | PRN

## 2022-03-11 MED ORDER — CHLORHEXIDINE GLUCONATE 0.12 % MT SOLN
15.0000 mL | Freq: Once | OROMUCOSAL | Status: AC
  Administered 2022-03-11: 15 mL via OROMUCOSAL
  Filled 2022-03-11: qty 15

## 2022-03-11 MED ORDER — PREGABALIN 100 MG PO CAPS
100.0000 mg | ORAL_CAPSULE | Freq: Three times a day (TID) | ORAL | Status: DC
  Administered 2022-03-11 – 2022-03-12 (×2): 100 mg via ORAL
  Filled 2022-03-11 (×2): qty 1

## 2022-03-11 MED ORDER — MENTHOL 3 MG MT LOZG: 1.0000 | LOZENGE | OROMUCOSAL | Status: DC | PRN

## 2022-03-11 MED ORDER — FENTANYL CITRATE (PF) 100 MCG/2ML IJ SOLN
25.0000 ug | INTRAMUSCULAR | Status: DC | PRN
  Administered 2022-03-11: 50 ug via INTRAVENOUS

## 2022-03-11 MED ORDER — ALBUTEROL SULFATE HFA 108 (90 BASE) MCG/ACT IN AERS: 1.0000 | INHALATION_SPRAY | Freq: Four times a day (QID) | RESPIRATORY_TRACT | Status: DC | PRN

## 2022-03-11 MED ORDER — MIDAZOLAM HCL 2 MG/2ML IJ SOLN: 2.0000 mg | Freq: Once | INTRAMUSCULAR | Status: AC

## 2022-03-11 MED ORDER — VITAMIN D (ERGOCALCIFEROL) 1.25 MG (50000 UNIT) PO CAPS: 50000.0000 [IU] | ORAL_CAPSULE | ORAL | Status: DC

## 2022-03-11 MED ORDER — MIDAZOLAM HCL 2 MG/2ML IJ SOLN
INTRAMUSCULAR | Status: AC
  Filled 2022-03-11: qty 2

## 2022-03-11 MED ORDER — ALPRAZOLAM 0.5 MG PO TABS
1.0000 mg | ORAL_TABLET | Freq: Four times a day (QID) | ORAL | Status: DC | PRN
  Administered 2022-03-11 – 2022-03-12 (×2): 1 mg via ORAL
  Filled 2022-03-11 (×2): qty 2

## 2022-03-11 MED ORDER — PHENOL 1.4 % MT LIQD: 1.0000 | OROMUCOSAL | Status: DC | PRN

## 2022-03-11 MED ORDER — ALUM & MAG HYDROXIDE-SIMETH 200-200-20 MG/5ML PO SUSP: 30.0000 mL | ORAL | Status: DC | PRN

## 2022-03-11 MED ORDER — PROMETHAZINE HCL 25 MG/ML IJ SOLN: 6.2500 mg | INTRAMUSCULAR | Status: DC | PRN

## 2022-03-11 MED ORDER — HYDROMORPHONE HCL 1 MG/ML IJ SOLN
0.5000 mg | INTRAMUSCULAR | Status: DC | PRN
  Administered 2022-03-11 (×3): 1 mg via INTRAVENOUS
  Filled 2022-03-11 (×4): qty 1

## 2022-03-11 MED ORDER — METOCLOPRAMIDE HCL 5 MG PO TABS: 5.0000 mg | ORAL_TABLET | Freq: Three times a day (TID) | ORAL | Status: DC | PRN

## 2022-03-11 MED ORDER — ONDANSETRON HCL 4 MG/2ML IJ SOLN: 4.0000 mg | Freq: Four times a day (QID) | INTRAMUSCULAR | Status: DC | PRN

## 2022-03-11 MED ORDER — MIDAZOLAM HCL 2 MG/2ML IJ SOLN
INTRAMUSCULAR | Status: AC
  Administered 2022-03-11: 2 mg via INTRAVENOUS
  Filled 2022-03-11: qty 2

## 2022-03-11 MED ORDER — FENTANYL CITRATE (PF) 100 MCG/2ML IJ SOLN
INTRAMUSCULAR | Status: AC
  Administered 2022-03-11: 50 ug via INTRAVENOUS
  Filled 2022-03-11: qty 2

## 2022-03-11 MED ORDER — ROPIVACAINE HCL 7.5 MG/ML IJ SOLN
INTRAMUSCULAR | Status: DC | PRN
  Administered 2022-03-11: 20 mL via PERINEURAL

## 2022-03-11 MED ORDER — ONDANSETRON HCL 4 MG/2ML IJ SOLN
INTRAMUSCULAR | Status: DC | PRN
  Administered 2022-03-11: 4 mg via INTRAVENOUS

## 2022-03-11 MED ORDER — MIDAZOLAM HCL 5 MG/5ML IJ SOLN
INTRAMUSCULAR | Status: DC | PRN
  Administered 2022-03-11: 2 mg via INTRAVENOUS

## 2022-03-11 MED ORDER — BUPIVACAINE IN DEXTROSE 0.75-8.25 % IT SOLN
INTRATHECAL | Status: DC | PRN
  Administered 2022-03-11: 1.6 mL via INTRATHECAL

## 2022-03-11 MED ORDER — LUMATEPERONE TOSYLATE 42 MG PO CAPS: 42.0000 mg | ORAL_CAPSULE | Freq: Every day | ORAL | Status: DC

## 2022-03-11 MED ORDER — METFORMIN HCL ER 750 MG PO TB24
750.0000 mg | ORAL_TABLET | Freq: Two times a day (BID) | ORAL | Status: DC
  Administered 2022-03-12: 750 mg via ORAL
  Filled 2022-03-11 (×2): qty 1

## 2022-03-11 SURGICAL SUPPLY — 70 items
BAG COUNTER SPONGE SURGICOUNT (BAG) ×1 IMPLANT
BANDAGE ESMARK 6X9 LF (GAUZE/BANDAGES/DRESSINGS) ×1 IMPLANT
BLADE SAG 18X100X1.27 (BLADE) ×1 IMPLANT
BNDG ELASTIC 4X5.8 VLCR STR LF (GAUZE/BANDAGES/DRESSINGS) IMPLANT
BNDG ELASTIC 6X5.8 VLCR STR LF (GAUZE/BANDAGES/DRESSINGS) ×2 IMPLANT
BNDG ESMARK 6X9 LF (GAUZE/BANDAGES/DRESSINGS) ×1
BOWL SMART MIX CTS (DISPOSABLE) ×1 IMPLANT
COMP FEM STD PS KNEE 7 RT (Joint) ×1 IMPLANT
COMPONENT FEM STD PS KNEE 7 RT (Joint) IMPLANT
COVER SURGICAL LIGHT HANDLE (MISCELLANEOUS) ×1 IMPLANT
CUFF TOURN SGL QUICK 34 (TOURNIQUET CUFF) ×1
CUFF TOURN SGL QUICK 42 (TOURNIQUET CUFF) IMPLANT
CUFF TRNQT CYL 34X4.125X (TOURNIQUET CUFF) ×1 IMPLANT
DRAPE EXTREMITY T 121X128X90 (DISPOSABLE) ×1 IMPLANT
DRAPE HALF SHEET 40X57 (DRAPES) ×1 IMPLANT
DRAPE U-SHAPE 47X51 STRL (DRAPES) ×1 IMPLANT
DRSG ADAPTIC 3X8 NADH LF (GAUZE/BANDAGES/DRESSINGS) IMPLANT
DURAPREP 26ML APPLICATOR (WOUND CARE) ×1 IMPLANT
ELECT CAUTERY BLADE 6.4 (BLADE) ×1 IMPLANT
ELECT REM PT RETURN 9FT ADLT (ELECTROSURGICAL) ×1
ELECTRODE REM PT RTRN 9FT ADLT (ELECTROSURGICAL) ×1 IMPLANT
FACESHIELD WRAPAROUND (MASK) ×2 IMPLANT
FACESHIELD WRAPAROUND OR TEAM (MASK) ×2 IMPLANT
GAUZE PAD ABD 8X10 STRL (GAUZE/BANDAGES/DRESSINGS) ×1 IMPLANT
GAUZE SPONGE 4X4 12PLY STRL (GAUZE/BANDAGES/DRESSINGS) ×1 IMPLANT
GAUZE XEROFORM 1X8 LF (GAUZE/BANDAGES/DRESSINGS) ×1 IMPLANT
GLOVE BIOGEL PI IND STRL 8 (GLOVE) ×2 IMPLANT
GLOVE ORTHO TXT STRL SZ7.5 (GLOVE) ×1 IMPLANT
GLOVE SURG ORTHO 8.0 STRL STRW (GLOVE) ×1 IMPLANT
GOWN STRL REUS W/ TWL LRG LVL3 (GOWN DISPOSABLE) IMPLANT
GOWN STRL REUS W/ TWL XL LVL3 (GOWN DISPOSABLE) ×2 IMPLANT
GOWN STRL REUS W/TWL LRG LVL3 (GOWN DISPOSABLE)
GOWN STRL REUS W/TWL XL LVL3 (GOWN DISPOSABLE) ×2
HANDPIECE INTERPULSE COAX TIP (DISPOSABLE) ×1
HDLS TROCR DRIL PIN KNEE 75 (PIN) ×2
IMMOBILIZER KNEE 22 UNIV (SOFTGOODS) ×1 IMPLANT
IMPL PATELLA METAL SZ32X10 (Joint) IMPLANT
INSERT TIB ASF VIV SZ 6-7 12H (Insert) IMPLANT
IV NS 1000ML (IV SOLUTION) ×1
IV NS 1000ML BAXH (IV SOLUTION) ×1 IMPLANT
KIT BASIN OR (CUSTOM PROCEDURE TRAY) ×1 IMPLANT
KIT TURNOVER KIT B (KITS) ×1 IMPLANT
MANIFOLD NEPTUNE II (INSTRUMENTS) ×1 IMPLANT
NDL 18GX1X1/2 (RX/OR ONLY) (NEEDLE) IMPLANT
NEEDLE 18GX1X1/2 (RX/OR ONLY) (NEEDLE) IMPLANT
NS IRRIG 1000ML POUR BTL (IV SOLUTION) ×1 IMPLANT
PACK TOTAL JOINT (CUSTOM PROCEDURE TRAY) ×1 IMPLANT
PAD ARMBOARD 7.5X6 YLW CONV (MISCELLANEOUS) ×1 IMPLANT
PADDING CAST COTTON 6X4 STRL (CAST SUPPLIES) ×1 IMPLANT
PENCIL BUTTON HOLSTER BLD 10FT (ELECTRODE) IMPLANT
PIN DRILL HDLS TROCAR 75 4PK (PIN) IMPLANT
SCREW FEMALE HEX FIX 25X2.5 (ORTHOPEDIC DISPOSABLE SUPPLIES) IMPLANT
SET HNDPC FAN SPRY TIP SCT (DISPOSABLE) ×1 IMPLANT
SET PAD KNEE POSITIONER (MISCELLANEOUS) ×1 IMPLANT
STAPLER VISISTAT 35W (STAPLE) ×1 IMPLANT
STEM TIB PS KNEE D 0D RT (Stem) IMPLANT
SUCTION FRAZIER HANDLE 10FR (MISCELLANEOUS) ×1
SUCTION TUBE FRAZIER 10FR DISP (MISCELLANEOUS) ×1 IMPLANT
SUT VIC AB 0 CT1 27 (SUTURE) ×1
SUT VIC AB 0 CT1 27XBRD ANBCTR (SUTURE) ×1 IMPLANT
SUT VIC AB 1 CT1 27 (SUTURE) ×2
SUT VIC AB 1 CT1 27XBRD ANBCTR (SUTURE) ×2 IMPLANT
SUT VIC AB 1 CT1 36 (SUTURE) IMPLANT
SUT VIC AB 2-0 CT1 27 (SUTURE) ×2
SUT VIC AB 2-0 CT1 TAPERPNT 27 (SUTURE) ×2 IMPLANT
SYR 50ML LL SCALE MARK (SYRINGE) IMPLANT
TOWEL GREEN STERILE (TOWEL DISPOSABLE) ×1 IMPLANT
TOWEL GREEN STERILE FF (TOWEL DISPOSABLE) ×1 IMPLANT
TRAY CATH 16FR W/PLASTIC CATH (SET/KITS/TRAYS/PACK) IMPLANT
WRAP KNEE MAXI GEL POST OP (GAUZE/BANDAGES/DRESSINGS) ×1 IMPLANT

## 2022-03-11 NOTE — Anesthesia Procedure Notes (Signed)
Anesthesia Regional Block: Adductor canal block   Pre-Anesthetic Checklist: , timeout performed,  Correct Patient, Correct Site, Correct Laterality,  Correct Procedure, Correct Position, site marked,  Risks and benefits discussed,  Surgical consent,  Pre-op evaluation,  At surgeon's request and post-op pain management  Laterality: Right  Prep: chloraprep       Needles:  Injection technique: Single-shot  Needle Type: Echogenic Needle     Needle Length: 10cm  Needle Gauge: 21     Additional Needles:   Narrative:  Start time: 03/11/2022 1:49 PM End time: 03/11/2022 1:52 PM Injection made incrementally with aspirations every 5 mL.  Performed by: Personally  Anesthesiologist: Audry Pili, MD  Additional Notes: No pain on injection. No increased resistance to injection. Injection made in 5cc increments. Good needle visualization. Patient tolerated the procedure well.

## 2022-03-11 NOTE — Anesthesia Procedure Notes (Addendum)
Spinal  Patient location during procedure: OR Start time: 03/11/2022 2:58 PM End time: 03/11/2022 3:04 PM Reason for block: surgical anesthesia Staffing Performed: anesthesiologist  Anesthesiologist: Suzette Battiest, MD Performed by: Suzette Battiest, MD Authorized by: Suzette Battiest, MD   Preanesthetic Checklist Completed: patient identified, IV checked, site marked, risks and benefits discussed, surgical consent, monitors and equipment checked, pre-op evaluation and timeout performed Spinal Block Patient position: sitting Prep: DuraPrep Patient monitoring: heart rate, cardiac monitor, continuous pulse ox and blood pressure Approach: midline Location: L3-4 Injection technique: single-shot Needle Needle type: Pencan  Needle gauge: 24 G Needle length: 9 cm Assessment Sensory level: T4 Events: CSF return

## 2022-03-11 NOTE — Op Note (Signed)
Operative Note  Date of operation: 03/11/2022 Preoperative diagnosis: Right knee primary osteoarthritis Postoperative diagnosis: Same  Procedure: Right press-fit total knee arthroplasty  Implants: Biomet/Zimmer persona press-fit knee system with size 7 CR standard femur, size D right tibial tray, 12 mm medial congruent right fixed-bearing polythene insert, 32 mm press-fit patella button  Surgeon: Lind Guest. Ninfa Linden, MD Assistant: Wandra Arthurs, RNFA  Anesthesia: #1 right lower extremity adductor canal block, #2 spinal Antibiotics: 2 g IV Ancef Tourniquet time: 48 minutes EBL: Less than 326 cc Complications: None  Indications: The patient is only 56 years old but she has debilitating arthritis involving her right knee has been well-documented clinical exam and x-ray findings.  She has tried and failed all forms of conservative treatment.  At this point her right knee pain is daily and it is detrimentally affecting her mobility, her quality of life and actives of daily living.  She does wish to proceed with a knee replacement and we agree with this based on her failure conservative treatment combined with her x-ray findings and clinical exam findings.  The risk and benefits of surgery been explained in detail including the risk of acute blood loss anemia, nerve or vessel injury, fracture, infection, DVT, implant failure and wound healing issues.  We talked about her goals being hopefully decrease pain, improve mobility and overall improve quality of life.  Procedure description: After informed consent was obtained and a right lower extremity block was obtained in the holding room, the patient was brought to the operating room and set up on the operating table where spinal anesthesia was obtained.  She was then laid in supine position on the operating table and a Foley catheter was placed.  A nonsterile tourniquet is placed around her upper right thigh and her right thigh, knee, leg and ankle  were prepped and draped with DuraPrep and sterile drapes including a sterile stockinette.  A timeout was called and she was identified as the correct patient the correct right knee.  An Esmarch was then used to wrap out the leg and the tourniquet was inflated to 300 mm of pressure.  With the knee extended a midline incision was made over the patella and carried proximally distally.  Dissection was carried down to the knee joint and a medial parapatellar arthrotomy was made.  A large joint effusion was encountered.  With the knee in a flexed position we found complete cartilage wear of the patellofemoral joint and the lateral compartment of the knee.  Osteophytes removed from all 3 compartments as well as remnants of the ACL, medial and lateral meniscus.  Using the extramedullary cutting guide in setting are cut to take 2 mm off the low side and correction varus and valgus as well as a 3 degree slope, we made our proximal tibial cut without difficulty.  Attention was then turned to the femur.  A distal femoral cutting guide was placed intramedullary through the notch and the knee.  We placed a right 10 mm cutting block set at 5 degrees externally rotated and made this cut without difficulty.  We brought the knee back down to full extension and with a 10 mm extension block and achieve full extension.  We then back to the femur and put a femoral sizing guide based off the epicondylar axis.  Based off of this we chose a size 7 femur.  We put a 4-in-1 cutting block for a size 7 femur and made her anterior and posterior cuts followed by her chamfer  cuts.  We then back to the tibia and chose a size right D tibial tray for coverage over the tibial plateau so the rotation of the femur and tibial tubercle.  We made our drill holes and keel punch off of this easily.  We did find her bone was of good quality for press-fit implants.  We trialed our size D right tibia with our size 7 right femur.  We trialed a 10 and then a 12 mm  medial congruent right fixed-bearing polythene insert we are pleased with the range of motion and stability of the 12 mm insert.  We then made a patella cut and drilled a single hole for a press-fit size 32 patella button.  We then removed all transportation from the knee and irrigate the knee with normal saline solution.  We dried the needle well with the knee in a flexed position placed our Biomet/Zimmer press-fit right size D tibial tray followed by our size 7 right standard CR press-fit femur.  We placed our 12 mm medial congruent polythene insert and press-fit our size 32 patella button.  I put the knee through several cycles of range of motion and we are pleased with range of motion and stability.  The tourniquet was let down and hemostasis was obtained with electrocautery.  The arthrotomy was closed with interrupted #1 Vicryl suture followed by 0 Vicryl close deep tissue and 2-0 Vicryl close subcutaneous tissue.  The skin was closed with staples.  Well-padded sterile dressing was applied.  The patient was taken recovery in stable condition with all final counts being correct and no complications noted.

## 2022-03-11 NOTE — Anesthesia Procedure Notes (Signed)
Procedure Name: MAC Date/Time: 03/11/2022 3:05 PM  Performed by: Reece Agar, CRNAPre-anesthesia Checklist: Patient identified, Emergency Drugs available, Suction available and Patient being monitored Patient Re-evaluated:Patient Re-evaluated prior to induction Oxygen Delivery Method: Simple face mask

## 2022-03-11 NOTE — Interval H&P Note (Signed)
History and Physical Interval Note: The patient understands that she is here today for a right total knee replacement to treat her severe right knee arthritis.  There is been no acute or interval change in her medical status.  See H&P.  The risks and benefits of surgery have been explained in detail and informed consent was obtained.  The left operative knee has been marked.  03/11/2022 2:02 PM  Megan Jimenez  has presented today for surgery, with the diagnosis of osteoarthritis right knee.  The various methods of treatment have been discussed with the patient and family. After consideration of risks, benefits and other options for treatment, the patient has consented to  Procedure(s) with comments: RIGHT TOTAL KNEE ARTHROPLASTY (Right) - Needs RNFA as a surgical intervention.  The patient's history has been reviewed, patient examined, no change in status, stable for surgery.  I have reviewed the patient's chart and labs.  Questions were answered to the patient's satisfaction.     Mcarthur Rossetti

## 2022-03-11 NOTE — Transfer of Care (Signed)
Immediate Anesthesia Transfer of Care Note  Patient: Megan Jimenez  Procedure(s) Performed: RIGHT TOTAL KNEE ARTHROPLASTY (Right: Knee)  Patient Location: PACU  Anesthesia Type:Regional and Spinal  Level of Consciousness: drowsy  Airway & Oxygen Therapy: Patient Spontanous Breathing and Patient connected to face mask oxygen  Post-op Assessment: Report given to RN and Post -op Vital signs reviewed and stable  Post vital signs: Reviewed and stable  Last Vitals:  Vitals Value Taken Time  BP 109/61 03/11/22 1656  Temp    Pulse 61 03/11/22 1657  Resp 11 03/11/22 1657  SpO2 100 % 03/11/22 1657  Vitals shown include unvalidated device data.  Last Pain:  Vitals:   03/11/22 1259  TempSrc:   PainSc: 7       Patients Stated Pain Goal: 2 (47/84/12 8208)  Complications: No notable events documented.

## 2022-03-11 NOTE — Anesthesia Preprocedure Evaluation (Addendum)
Anesthesia Evaluation  Patient identified by MRN, date of birth, ID band Patient awake    Reviewed: Allergy & Precautions, NPO status , Patient's Chart, lab work & pertinent test results  History of Anesthesia Complications Negative for: history of anesthetic complications  Airway Mallampati: II  TM Distance: >3 FB Neck ROM: Full    Dental  (+) Dental Advisory Given, Chipped,    Pulmonary asthma , former smoker   Pulmonary exam normal        Cardiovascular hypertension, Normal cardiovascular exam     Neuro/Psych  Headaches PSYCHIATRIC DISORDERS Anxiety Depression Bipolar Disorder      GI/Hepatic negative GI ROS, Neg liver ROS,,,  Endo/Other   Pre-DM   Renal/GU negative Renal ROS     Musculoskeletal  (+) Arthritis ,    Abdominal   Peds  Hematology negative hematology ROS (+)   Anesthesia Other Findings   Reproductive/Obstetrics                             Anesthesia Physical Anesthesia Plan  ASA: 2  Anesthesia Plan: Spinal   Post-op Pain Management: Regional block* and Tylenol PO (pre-op)*   Induction:   PONV Risk Score and Plan: 2 and Treatment may vary due to age or medical condition and Propofol infusion  Airway Management Planned: Natural Airway and Simple Face Mask  Additional Equipment: None  Intra-op Plan:   Post-operative Plan:   Informed Consent: I have reviewed the patients History and Physical, chart, labs and discussed the procedure including the risks, benefits and alternatives for the proposed anesthesia with the patient or authorized representative who has indicated his/her understanding and acceptance.       Plan Discussed with: CRNA and Anesthesiologist  Anesthesia Plan Comments: (Labs reviewed, platelets acceptable. Discussed risks and benefits of spinal, including spinal/epidural hematoma, infection, failed block, and PDPH. Patient expressed  understanding and wished to proceed. )       Anesthesia Quick Evaluation

## 2022-03-12 DIAGNOSIS — Z7952 Long term (current) use of systemic steroids: Secondary | ICD-10-CM | POA: Diagnosis not present

## 2022-03-12 DIAGNOSIS — Z79899 Other long term (current) drug therapy: Secondary | ICD-10-CM | POA: Diagnosis not present

## 2022-03-12 DIAGNOSIS — Z7984 Long term (current) use of oral hypoglycemic drugs: Secondary | ICD-10-CM | POA: Diagnosis not present

## 2022-03-12 DIAGNOSIS — Z8616 Personal history of COVID-19: Secondary | ICD-10-CM | POA: Diagnosis not present

## 2022-03-12 DIAGNOSIS — Z87891 Personal history of nicotine dependence: Secondary | ICD-10-CM | POA: Diagnosis not present

## 2022-03-12 DIAGNOSIS — J45909 Unspecified asthma, uncomplicated: Secondary | ICD-10-CM | POA: Diagnosis not present

## 2022-03-12 DIAGNOSIS — M1711 Unilateral primary osteoarthritis, right knee: Secondary | ICD-10-CM | POA: Diagnosis not present

## 2022-03-12 LAB — CBC
HCT: 36.6 % (ref 36.0–46.0)
Hemoglobin: 11.8 g/dL — ABNORMAL LOW (ref 12.0–15.0)
MCH: 30.9 pg (ref 26.0–34.0)
MCHC: 32.2 g/dL (ref 30.0–36.0)
MCV: 95.8 fL (ref 80.0–100.0)
Platelets: 240 10*3/uL (ref 150–400)
RBC: 3.82 MIL/uL — ABNORMAL LOW (ref 3.87–5.11)
RDW: 13 % (ref 11.5–15.5)
WBC: 13.4 10*3/uL — ABNORMAL HIGH (ref 4.0–10.5)
nRBC: 0 % (ref 0.0–0.2)

## 2022-03-12 LAB — BASIC METABOLIC PANEL
Anion gap: 9 (ref 5–15)
BUN: 5 mg/dL — ABNORMAL LOW (ref 6–20)
CO2: 27 mmol/L (ref 22–32)
Calcium: 8.8 mg/dL — ABNORMAL LOW (ref 8.9–10.3)
Chloride: 100 mmol/L (ref 98–111)
Creatinine, Ser: 0.52 mg/dL (ref 0.44–1.00)
GFR, Estimated: >60 mL/min (ref >60)
Glucose, Bld: 130 mg/dL — ABNORMAL HIGH (ref 70–99)
Potassium: 4.2 mmol/L (ref 3.5–5.1)
Sodium: 136 mmol/L (ref 135–145)

## 2022-03-12 LAB — GLUCOSE, CAPILLARY: Glucose-Capillary: 91 mg/dL (ref 70–99)

## 2022-03-12 MED ORDER — GABAPENTIN 600 MG PO TABS: 600.0000 mg | ORAL_TABLET | Freq: Three times a day (TID) | ORAL | 0 refills | Status: DC | PRN

## 2022-03-12 MED ORDER — TIZANIDINE HCL 4 MG PO TABS: 4.0000 mg | ORAL_TABLET | Freq: Four times a day (QID) | ORAL | 1 refills | Status: DC | PRN

## 2022-03-12 MED ORDER — OXYCODONE HCL 5 MG PO TABS: 5.0000 mg | ORAL_TABLET | ORAL | 0 refills | Status: DC | PRN

## 2022-03-12 MED ORDER — ASPIRIN 81 MG PO CHEW: 81.0000 mg | CHEWABLE_TABLET | Freq: Two times a day (BID) | ORAL | 0 refills | Status: DC

## 2022-03-12 MED ORDER — TIZANIDINE HCL 4 MG PO TABS
4.0000 mg | ORAL_TABLET | Freq: Four times a day (QID) | ORAL | Status: DC | PRN
  Administered 2022-03-12: 4 mg via ORAL
  Filled 2022-03-12: qty 1

## 2022-03-12 MED ORDER — KETOROLAC TROMETHAMINE 15 MG/ML IJ SOLN
15.0000 mg | Freq: Four times a day (QID) | INTRAMUSCULAR | Status: AC
  Administered 2022-03-12 (×2): 15 mg via INTRAVENOUS
  Filled 2022-03-12 (×2): qty 1

## 2022-03-12 NOTE — Plan of Care (Signed)
  Problem: Education: Goal: Knowledge of the prescribed therapeutic regimen will improve Outcome: Completed/Met Goal: Individualized Educational Video(s) Outcome: Completed/Met   Problem: Activity: Goal: Ability to avoid complications of mobility impairment will improve Outcome: Completed/Met Goal: Range of joint motion will improve Outcome: Completed/Met   Problem: Clinical Measurements: Goal: Postoperative complications will be avoided or minimized Outcome: Completed/Met   Problem: Pain Management: Goal: Pain level will decrease with appropriate interventions Outcome: Completed/Met   Problem: Skin Integrity: Goal: Will show signs of wound healing Outcome: Completed/Met  Patient alert and oriented, Void, VSS, ambulate, surgical site clean and dry. D/c instructions explain and copy given to the patient all questions answered. Will d/c patient home per order

## 2022-03-12 NOTE — Discharge Summary (Signed)
Patient ID: Megan Jimenez MRN: [id number] DOB/AGE: 57-Jan-1967 57 y.o.  Admit date: 03/11/2022 Discharge date: 03/12/2022  Admission Diagnoses:  Principal Problem:   Unilateral primary osteoarthritis, right knee Active Problems:   Status post total right knee replacement   Status post total knee replacement   Discharge Diagnoses:  Same  Past Medical History:  Diagnosis Date   Anxiety    Arthritis    Asthma    Bipolar 1 disorder ([place])    COVID-19 03/29/2021   Family history of breast cancer    Family history of colon cancer    Family history of leukemia    Family history of lung cancer    Family history of ovarian cancer    Family history of throat cancer    Pre-diabetes     Surgeries: Procedure(s): RIGHT TOTAL KNEE ARTHROPLASTY on 03/11/2022   Consultants:   Discharged Condition: Improved  Hospital Course: Megan Jimenez is an 56 y.o. female who was admitted 03/11/2022 for operative treatment ofUnilateral primary osteoarthritis, right knee. Patient has severe unremitting pain that affects sleep, daily activities, and work/hobbies. After pre-op clearance the patient was taken to the operating room on 03/11/2022 and underwent  Procedure(s): RIGHT TOTAL KNEE ARTHROPLASTY.    Patient was given perioperative antibiotics:  Anti-infectives (From admission, onward)    Start     Dose/Rate Route Frequency Ordered Stop   03/12/22 0600  ceFAZolin (ANCEF) IVPB 2g/100 mL premix        2 g 200 mL/hr over 30 Minutes Intravenous On call to O.R. 03/11/22 1219 03/11/22 1525   03/11/22 2200  ceFAZolin (ANCEF) IVPB 1 g/50 mL premix        1 g 100 mL/hr over 30 Minutes Intravenous Every 6 hours 03/11/22 1806 03/12/22 0408        Patient was given sequential compression devices, early ambulation, and chemoprophylaxis to prevent DVT.  Patient benefited maximally from hospital stay and there were no complications.    Recent vital signs: Patient Vitals for the past 24 hrs:  BP  Temp Temp src Pulse Resp SpO2  03/12/22 0347 (!) 168/91 98.4 F (36.9 C) Oral 70 18 98 %  03/12/22 0049 (!) 159/87 -- -- 71 (!) 22 97 %  03/11/22 2308 (!) 182/88 98.4 F (36.9 C) Oral (!) 59 18 99 %     Recent laboratory studies:  Recent Labs    03/12/22 0731  WBC 13.4*  HGB 11.8*  HCT 36.6  PLT 240  NA 136  K 4.2  CL 100  CO2 27  BUN 5*  CREATININE 0.52  GLUCOSE 130*  CALCIUM 8.8*     Discharge Medications:   Allergies as of 03/12/2022   No Known Allergies      Medication List     TAKE these medications    albuterol (2.5 MG/3ML) 0.083% nebulizer solution Commonly known as: PROVENTIL Take 3 mLs (2.5 mg total) by nebulization every 6 (six) hours as needed for wheezing or shortness of breath.   albuterol 108 (90 Base) MCG/ACT inhaler Commonly known as: VENTOLIN HFA TAKE 2 PUFFS BY MOUTH EVERY 4 HOURS AS NEEDED FOR COUGH OR WHEEZING   ALPRAZolam 1 MG tablet Commonly known as: XANAX Take 1 mg by mouth 4 (four) times daily as needed for anxiety.   aspirin 81 MG chewable tablet Chew 1 tablet (81 mg total) by mouth 2 (two) times daily.   CALCIUM/VITAMIN D PO Take 1 tablet by mouth daily.   Caplyta 42 MG capsule  Generic drug: lumateperone tosylate Take 42 mg by mouth at bedtime.   cyanocobalamin 500 MCG tablet Commonly known as: VITAMIN B12 Take 500 mcg by mouth daily.   DULoxetine 60 MG capsule Commonly known as: Cymbalta Take 1 capsule (60 mg total) by mouth daily.   Eszopiclone 3 MG Tabs Take 1 tablet (3 mg total) by mouth at bedtime. What changed:  when to take this reasons to take this   ferrous sulfate 325 (65 FE) MG tablet Take 325 mg by mouth daily with breakfast.   FISH OIL PO Take 1 capsule by mouth daily.   fluticasone 50 MCG/ACT nasal spray Commonly known as: FLONASE SPRAY 2 SPRAYS INTO EACH NOSTRIL EVERY DAY   fluticasone furoate-vilanterol 200-25 MCG/ACT Aepb Commonly known as: BREO ELLIPTA Inhale 1 puff into the lungs  daily.   gabapentin 600 MG tablet Commonly known as: NEURONTIN Take 1 tablet (600 mg total) by mouth 3 (three) times daily as needed (Burning pain).   loratadine 10 MG tablet Commonly known as: CLARITIN Take 10 mg by mouth daily.   meloxicam 15 MG tablet Commonly known as: MOBIC Take 1 tablet (15 mg total) by mouth daily.   metFORMIN 750 MG 24 hr tablet Commonly known as: GLUCOPHAGE-XR TAKE 1 TABLET (750 MG TOTAL) BY MOUTH 2 (TWO) TIMES DAILY AFTER A MEAL.   methocarbamol 500 MG tablet Commonly known as: ROBAXIN TAKE 1 TABLET BY MOUTH EVERY 6 HOURS AS NEEDED FOR MUSCLE SPASMS.   montelukast 10 MG tablet Commonly known as: SINGULAIR TAKE 1 TABLET BY MOUTH EVERYDAY AT BEDTIME   multivitamin with minerals Tabs tablet Take 1 tablet by mouth daily.   oxyCODONE 5 MG immediate release tablet Commonly known as: Oxy IR/ROXICODONE Take 1-2 tablets (5-10 mg total) by mouth every 4 (four) hours as needed for moderate pain (pain score 4-6).   phentermine 37.5 MG capsule Take 1 capsule (37.5 mg total) by mouth every morning.   pregabalin 100 MG capsule Commonly known as: LYRICA Take 1 capsule (100 mg total) by mouth 3 (three) times daily.   tiZANidine 4 MG tablet Commonly known as: ZANAFLEX Take 1 tablet (4 mg total) by mouth every 6 (six) hours as needed for muscle spasms.   vitamin C 1000 MG tablet Take 1,000 mg by mouth daily.   Vitamin D (Ergocalciferol) 1.25 MG (50000 UNIT) Caps capsule Commonly known as: DRISDOL Take 50,000 Units by mouth every Friday.               Durable Medical Equipment  (From admission, onward)           Start     Ordered   03/11/22 1807  DME 3 n 1  Once        03/11/22 1806   03/11/22 1807  DME Walker rolling  Once       Question Answer Comment  Walker: With 5 Inch Wheels   Patient needs a walker to treat with the following condition Status post total right knee replacement      03/11/22 1806            Diagnostic  Studies: DG Knee Right Port  Result Date: 03/11/2022 CLINICAL DATA:  Status post right knee replacement EXAM: PORTABLE RIGHT KNEE - 1-2 VIEW COMPARISON:  12/02/2021 FINDINGS: New right knee prosthesis is noted in satisfactory position. Old healed proximal fibular fracture is seen. IMPRESSION: Status post right knee replacement Electronically Signed   By: Inez Catalina M.D.   On: 03/11/2022 19:24  XR C-ARM NO REPORT  Result Date: 02/27/2022 Please see Notes tab for imaging impression.  Epidural Steroid injection  Result Date: 02/27/2022 Magnus Sinning, MD     03/05/2022  6:26 AM Lumbosacral Transforaminal Epidural Steroid Injection - Sub-Pedicular Approach with Fluoroscopic Guidance Patient: TEHANI MERSMAN     Date of Birth: Jan 08, 1966 MRN: [id number] PCP: Gwyneth Sprout, FNP     Visit Date: 02/27/2022  Universal Protocol:   Date/Time: 02/27/2022 Consent Given By: the patient Position: PRONE Additional Comments: Vital signs were monitored before and after the procedure. Patient was prepped and draped in the usual sterile fashion. The correct patient, procedure, and site was verified. Injection Procedure Details: Procedure diagnoses: Lumbar radiculopathy [M54.16]  Meds Administered: Meds ordered this encounter Medications  dexamethasone (DECADRON) injection 15 mg Laterality: Left Location/Site: L1 Needle:5.0 in., 22 ga.  Short bevel or Quincke spinal needle Needle Placement: Transforaminal Findings:   -Comments: Excellent flow of contrast along the nerve, nerve root and into the epidural space. Procedure Details: After squaring off the end-plates to get a true AP view, the C-arm was positioned so that an oblique view of the foramen as noted above was visualized. The target area is just inferior to the "nose of the scotty dog" or sub pedicular. The soft tissues overlying this structure were infiltrated with 2-3 ml. of 1% Lidocaine without Epinephrine. The spinal needle was inserted toward the target  using a "trajectory" view along the fluoroscope beam.  Under AP and lateral visualization, the needle was advanced so it did not puncture dura and was located close the 6 O'Clock position of the pedical in AP tracterory. Biplanar projections were used to confirm position. Aspiration was confirmed to be negative for CSF and/or blood. A 1-2 ml. volume of Isovue-250 was injected and flow of contrast was noted at each level. Radiographs were obtained for documentation purposes. After attaining the desired flow of contrast documented above, a 0.5 to 1.0 ml test dose of 0.25% Marcaine was injected into each respective transforaminal space.  The patient was observed for 90 seconds post injection.  After no sensory deficits were reported, and normal lower extremity motor function was noted,   the above injectate was administered so that equal amounts of the injectate were placed at each foramen (level) into the transforaminal epidural space. Additional Comments: The patient tolerated the procedure well Dressing: 2 x 2 sterile gauze and Band-Aid  Post-procedure details: Patient was observed during the procedure. Post-procedure instructions were reviewed. Patient left the clinic in stable condition.    Disposition: Discharge disposition: 01-Home or Self Care          Follow-up Information     Mcarthur Rossetti, MD Follow up in 2 week(s).   Specialty: Orthopedic Surgery Contact information: [place] [address] [place], [place] Follow up.   Specialty: [place] Why: The home health agency will contact you for the first home visit Contact information: [place] [address] [phone]                  Signed: Mcarthur Rossetti 03/12/2022, 9:46 PM

## 2022-03-12 NOTE — Discharge Instructions (Signed)

## 2022-03-12 NOTE — Evaluation (Signed)
Occupational Therapy Evaluation Patient Details Name: Megan Jimenez MRN: [id number] DOB: 1966/01/17 Today's Date: 03/12/2022   History of Present Illness 56 y.o. female admitted 03/11/22 for Right TKA. PMH includes asthma, bipolar disorder, COVID-19, C4-7 ACDF.   Clinical Impression   Pt is typically independent. Presents with R knee pain and impaired standing balance. She requires up to min assist for ADLs and supervision for mobility with RW. Educated pt in compensatory strategies for LB bathing and dressing and use of 3 in 1 as shower seat. Pt verbalized understanding. Will practice tub transfer if pt remains hospitalized, otherwise pt has a written handout describing technique.      Recommendations for follow up therapy are one component of a multi-disciplinary discharge planning process, led by the attending physician.  Recommendations may be updated based on patient status, additional functional criteria and insurance authorization.   Follow Up Recommendations  No OT follow up     Assistance Recommended at Discharge Intermittent Supervision/Assistance  Patient can return home with the following A little help with bathing/dressing/bathroom;Assistance with cooking/housework;Assist for transportation;Help with stairs or ramp for entrance    Functional Status Assessment  Patient has had a recent decline in their functional status and demonstrates the ability to make significant improvements in function in a reasonable and predictable amount of time.  Equipment Recommendations  None recommended by OT    Recommendations for Other Services       Precautions / Restrictions Precautions Precautions: Knee Precaution Booklet Issued: Yes (comment) Precaution Comments: Provided and reviewed handout Restrictions Weight Bearing Restrictions: Yes RLE Weight Bearing: Weight bearing as tolerated      Mobility Bed Mobility Overal bed mobility: Needs Assistance Bed Mobility: Sit to  Supine       Sit to supine: Min assist   General bed mobility comments: light min assist for R LE back into bed    Transfers Overall transfer level: Needs assistance Equipment used: Rolling walker (2 wheels) Transfers: Sit to/from Stand Sit to Stand: Supervision                  Balance     Sitting balance-Leahy Scale: Good       Standing balance-Leahy Scale: Fair                             ADL either performed or assessed with clinical judgement   ADL Overall ADL's : Needs assistance/impaired Eating/Feeding: Independent   Grooming: Supervision/safety;Standing   Upper Body Bathing: Set up;Sitting   Lower Body Bathing: Minimal assistance;Sit to/from stand Lower Body Bathing Details (indicate cue type and reason): recommended seated showering, use of long handled bath sponge and reacher Upper Body Dressing : Set up;Sitting   Lower Body Dressing: Minimal assistance;Sit to/from stand Lower Body Dressing Details (indicate cue type and reason): recommended use of her reacher and sock donner and to dress operated leg first       Toileting - Clothing Manipulation Details (indicate cue type and reason): knowledgeable in use of 3 in1 over toilet   Tub/Shower Transfer Details (indicate cue type and reason): knowledgeable in use of 3 in 1 as shower seat   General ADL Comments: Educated in safely transporting items with RW.     Vision Baseline Vision/History: 1 Wears glasses Ability to See in Adequate Light: 0 Adequate Patient Visual Report: No change from baseline       Perception     Praxis  Pertinent Vitals/Pain Pain Assessment Pain Assessment: Faces Faces Pain Scale: Hurts whole lot Pain Location: R knee Pain Descriptors / Indicators: Aching Pain Intervention(s): Premedicated before session, Repositioned, Ice applied     Hand Dominance Right   Extremity/Trunk Assessment Upper Extremity Assessment Upper Extremity Assessment:  Overall WFL for tasks assessed   Lower Extremity Assessment Lower Extremity Assessment: Defer to PT evaluation RLE Deficits / Details: Post op weakness and guarding RLE: Unable to fully assess due to pain   Cervical / Trunk Assessment Cervical / Trunk Assessment: Normal   Communication Communication Communication: No difficulties   Cognition Arousal/Alertness: Awake/alert Behavior During Therapy: WFL for tasks assessed/performed Overall Cognitive Status: Within Functional Limits for tasks assessed                                       General Comments  Reviewed precautions and handout    Exercises     Shoulder Instructions      Home Living Family/patient expects to be discharged to:: Private residence Living Arrangements: Spouse/significant other Available Help at Discharge: Family;Available PRN/intermittently Type of Home: House Home Access: Stairs to enter [employer] of Steps: 4 Entrance Stairs-Rails: None Home Layout: One level     Bathroom Shower/Tub: Teacher, early years/pre: Standard     Home Equipment: BSC/3in1;Adaptive equipment Adaptive Equipment: Reacher;Sock aid Additional Comments: Plans to go in through front steps no rail. Husband to assist.      Prior Functioning/Environment Prior Level of Function : Independent/Modified Independent             Mobility Comments: works from home          OT Problem List: Impaired balance (sitting and/or standing);Decreased knowledge of use of DME or AE;Pain      OT Treatment/Interventions: DME and/or AE instruction;Self-care/ADL training    OT Goals(Current goals can be found in the care plan section) Acute Rehab OT Goals OT Goal Formulation: With patient Time For Goal Achievement: 03/26/22 Potential to Achieve Goals: Good ADL Goals Pt Will Perform Grooming: with modified independence;standing Pt Will Transfer to Toilet: with modified  independence;ambulating;bedside commode Pt Will Perform Tub/Shower Transfer: Tub transfer;with supervision;ambulating;3 in 1;rolling walker  OT Frequency: Min 2X/week    Co-evaluation              AM-PAC OT "6 Clicks" Daily Activity     Outcome Measure Help from another person eating meals?: None Help from another person taking care of personal grooming?: A Little Help from another person toileting, which includes using toliet, bedpan, or urinal?: A Little Help from another person bathing (including washing, rinsing, drying)?: A Little Help from another person to put on and taking off regular upper body clothing?: None Help from another person to put on and taking off regular lower body clothing?: A Little 6 Click Score: 20   End of Session Equipment Utilized During Treatment: Rolling walker (2 wheels)  Activity Tolerance: Patient tolerated treatment well Patient left: in bed;with call bell/phone within reach  OT Visit Diagnosis: Unsteadiness on feet (R26.81);Other abnormalities of gait and mobility (R26.89);Pain                Time: 7510-2585 OT Time Calculation (min): 19 min Charges:  OT General Charges $OT Visit: 1 Visit OT Evaluation $OT Eval Low Complexity: [place], OTR/L Acute Rehabilitation Services Office: [phone]  Malka So 03/12/2022, 12:23 PM

## 2022-03-12 NOTE — Progress Notes (Signed)
Subjective: 1 Day Post-Op Procedure(s) (LRB): RIGHT TOTAL KNEE ARTHROPLASTY (Right) Patient reports pain as moderate.    Objective: Vital signs in last 24 hours: Temp:  [97.6 F (36.4 C)-98.7 F (37.1 C)] 98.4 F (36.9 C) (12/27 0347) Pulse Rate:  [55-79] 70 (12/27 0347) Resp:  [12-22] 18 (12/27 0347) BP: (109-182)/(61-96) 168/91 (12/27 0347) SpO2:  [96 %-100 %] 98 % (12/27 0347) Weight:  [101.6 kg] 101.6 kg (12/26 1215)  Intake/Output from previous day: 12/26 0701 - 12/27 0700 In: 2520 [P.O.:720; I.V.:1500; IV Piggyback:300] Out: 2930 [Urine:2900; Blood:30] Intake/Output this shift: No intake/output data recorded.  No results for input(s): "HGB" in the last 72 hours. No results for input(s): "WBC", "RBC", "HCT", "PLT" in the last 72 hours. No results for input(s): "NA", "K", "CL", "CO2", "BUN", "CREATININE", "GLUCOSE", "CALCIUM" in the last 72 hours. No results for input(s): "LABPT", "INR" in the last 72 hours.  Sensation intact distally Intact pulses distally Dorsiflexion/Plantar flexion intact Incision: scant drainage   Assessment/Plan: 1 Day Post-Op Procedure(s) (LRB): RIGHT TOTAL KNEE ARTHROPLASTY (Right) Up with therapy      Megan Jimenez 03/12/2022, 7:46 AM

## 2022-03-12 NOTE — Anesthesia Postprocedure Evaluation (Signed)
Anesthesia Post Note  Patient: Megan Jimenez  Procedure(s) Performed: RIGHT TOTAL KNEE ARTHROPLASTY (Right: Knee)     Patient location during evaluation: PACU Anesthesia Type: Spinal Level of consciousness: awake and alert Pain management: pain level controlled Vital Signs Assessment: post-procedure vital signs reviewed and stable Respiratory status: spontaneous breathing and respiratory function stable Cardiovascular status: blood pressure returned to baseline and stable Postop Assessment: spinal receding Anesthetic complications: no   No notable events documented.  Last Vitals:  Vitals:   03/12/22 0049 03/12/22 0347  BP: (!) 159/87 (!) 168/91  Pulse: 71 70  Resp: (!) 22 18  Temp:  36.9 C  SpO2: 97% 98%    Last Pain:  Vitals:   03/12/22 0802  TempSrc:   PainSc: 10-Worst pain ever                 Tiajuana Amass

## 2022-03-12 NOTE — Evaluation (Signed)
Physical Therapy Evaluation Patient Details Name: Megan Jimenez MRN: [id number] DOB: 1965/06/08 Today's Date: 03/12/2022  History of Present Illness  56 y.o. female admitted 03/11/22 for Right TKA. PMH includes asthma, bipolar disorder, COVID-19, C4-7 ACDF.  Clinical Impression  Patient is s/p above surgery resulting in functional limitations due to the deficits listed below (see PT Problem List). Ambulates and transfer with min guard for safety. Slow and guarded due to pain but gait symmetry improved rapidly with cues and feedback. Participated in stair training using posterior approach with RW (husband will assist pt at d/c.) Reviewed TKA precautions, handout provided, and HEP reviewed. Patient will benefit from skilled PT to increase their independence and safety with mobility to allow discharge to the venue listed below.          Recommendations for follow up therapy are one component of a multi-disciplinary discharge planning process, led by the attending physician.  Recommendations may be updated based on patient status, additional functional criteria and insurance authorization.  Follow Up Recommendations Follow physician's recommendations for discharge plan and follow up therapies      Assistance Recommended at Discharge Intermittent Supervision/Assistance  Patient can return home with the following  A little help with walking and/or transfers;A little help with bathing/dressing/bathroom;Assistance with cooking/housework;Help with stairs or ramp for entrance;Assist for transportation    Equipment Recommendations Rolling walker (2 wheels)  Recommendations for Other Services       Functional Status Assessment Patient has had a recent decline in their functional status and demonstrates the ability to make significant improvements in function in a reasonable and predictable amount of time.     Precautions / Restrictions Precautions Precautions: Knee Precaution Booklet Issued:  Yes (comment) Precaution Comments: Provided and reviewed handout Restrictions Weight Bearing Restrictions: Yes RLE Weight Bearing: Weight bearing as tolerated      Mobility  Bed Mobility Overal bed mobility: Needs Assistance Bed Mobility: Supine to Sit     Supine to sit: Supervision     General bed mobility comments: Supervision for safety, extra time. Cues for technique to use LLE to support RLE.    Transfers Overall transfer level: Needs assistance Equipment used: Rolling walker (2 wheels) Transfers: Sit to/from Stand Sit to Stand: Min guard           General transfer comment: Min guard for safety cues for technique. Took a couple of tries but eventually with additional cues able to rise from bed without physical assistance.    Ambulation/Gait Ambulation/Gait assistance: Min guard Gait Distance (Feet): 115 Feet Assistive device: Rolling walker (2 wheels) Gait Pattern/deviations: Step-to pattern, Step-through pattern, Decreased stride length, Decreased stance time - right, Decreased weight shift to right, Antalgic Gait velocity: dec Gait velocity interpretation: <1.31 ft/sec, indicative of household ambulator   General Gait Details: Educated on gait with RW for support. Progressed from step-to pattern to step-through pattern. Reduced Rt knee flexion through gait cycle. No overt buckling noted however heavy use of RW for support.  Stairs Stairs: Yes Stairs assistance: Min assist Stair Management: No rails, Step to pattern, Backwards, With walker Number of Stairs: 4 General stair comments: Educated on stair navigation with walker, min assist to block RW. Cues for sequencing. Good control with non operative LE and UE support.  Wheelchair Mobility    Modified Rankin (Stroke Patients Only)       Balance Overall balance assessment: Needs assistance Sitting-balance support: No upper extremity supported, Feet supported Sitting balance-Leahy Scale: Good      Standing  balance support: No upper extremity supported, During functional activity Standing balance-Leahy Scale: Fair Standing balance comment: Reduced WB through RLE                             Pertinent Vitals/Pain Pain Assessment Pain Assessment: 0-10 Pain Score: 8  Pain Location: Rt knee, earlier Rt groin pain but now better Pain Descriptors / Indicators: Aching Pain Intervention(s): Limited activity within patient's tolerance, Monitored during session, Repositioned, Premedicated before session    Home Living Family/patient expects to be discharged to:: Private residence Living Arrangements: Spouse/significant other Available Help at Discharge: Family;Available PRN/intermittently Type of Home: House Home Access: Stairs to enter Entrance Stairs-Rails: None Entrance Stairs-Number of Steps: 4   Home Layout: One level Home Equipment: BSC/3in1 Additional Comments: Plans to go in through front steps no rail. Husband to assist.    Prior Function Prior Level of Function : Independent/Modified Independent             Mobility Comments: works from home       [place]: Right    Extremity/Trunk Assessment   Upper Extremity Assessment Upper Extremity Assessment: Defer to OT evaluation    Lower Extremity Assessment Lower Extremity Assessment: RLE deficits/detail RLE Deficits / Details: Post op weakness and guarding RLE: Unable to fully assess due to pain       Communication   Communication: No difficulties  Cognition Arousal/Alertness: Awake/alert Behavior During Therapy: WFL for tasks assessed/performed Overall Cognitive Status: Within Functional Limits for tasks assessed                                          General Comments General comments (skin integrity, edema, etc.): Reviewed precautions and handout    Exercises Total Joint Exercises Ankle Circles/Pumps: AROM, Both, Supine, 10 reps Quad Sets:  Strengthening, Both, 5 reps, Seated Towel Squeeze: Strengthening, Both, 5 reps, Seated Short Arc Quad: Right, AAROM, 5 reps, Seated Heel Slides: AAROM, Right, 5 reps, Seated Hip ABduction/ADduction: Strengthening, Both, 5 reps, Supine Goniometric ROM: 7-62 deg Rt knee flexion   Assessment/Plan    PT Assessment Patient needs continued PT services  PT Problem List Decreased strength;Decreased range of motion;Decreased activity tolerance;Decreased balance;Decreased mobility;Decreased knowledge of use of DME;Decreased knowledge of precautions;Obesity;Pain       PT Treatment Interventions DME instruction;Gait training;Stair training;Functional mobility training;Therapeutic activities;Therapeutic exercise;Balance training;Neuromuscular re-education;Patient/family education;Modalities    PT Goals (Current goals can be found in the Care Plan section)  Acute Rehab PT Goals Patient Stated Goal: Get well, reduce pain PT Goal Formulation: With patient Time For Goal Achievement: 03/19/22 Potential to Achieve Goals: Good    Frequency 7X/week     Co-evaluation               AM-PAC PT "6 Clicks" Mobility  Outcome Measure Help needed turning from your back to your side while in a flat bed without using bedrails?: A Little Help needed moving from lying on your back to sitting on the side of a flat bed without using bedrails?: A Little Help needed moving to and from a bed to a chair (including a wheelchair)?: A Little Help needed standing up from a chair using your arms (e.g., wheelchair or bedside chair)?: A Little Help needed to walk in hospital room?: A Little Help needed climbing 3-5 steps with a  railing? : A Little 6 Click Score: 18    End of Session Equipment Utilized During Treatment: Gait belt Activity Tolerance: Patient tolerated treatment well Patient left: in chair;with call bell/phone within reach Nurse Communication: Mobility status PT Visit Diagnosis: Unsteadiness on feet  (R26.81);Other abnormalities of gait and mobility (R26.89);Difficulty in walking, not elsewhere classified (R26.2);Pain Pain - Right/Left: Right Pain - part of body: Knee    Time: 0935-1007 PT Time Calculation (min) (ACUTE ONLY): 32 min   Charges:   PT Evaluation $PT Eval Low Complexity: 1 Low PT Treatments $Gait Training: 8-22 mins        Candie Mile, PT, DPT Physical Therapist Acute Rehabilitation [facility] [place] 03/12/2022, 10:36 AM

## 2022-03-12 NOTE — Progress Notes (Signed)
Physical Therapy Treatment Patient Details Name: Megan Jimenez MRN: [id number] DOB: 10/17/1965 Today's Date: 03/12/2022   History of Present Illness 56 y.o. female admitted 03/11/22 for Right TKA. PMH includes asthma, bipolar disorder, COVID-19, C4-7 ACDF.    PT Comments    Tolerated treatment well this afternoon. Reviewed stair navigation, including alternative method. Now comfortable with both techniques (posterior with RW and lateral with rail.) No evidence of buckling while ambulating with RW for support. Reviewed precautions and other techniques for bed mobility using LLE and/or bed sheet to assist RLE. All questions answered. Patient will continue to benefit from skilled physical therapy services to further improve independence with functional mobility.   Recommendations for follow up therapy are one component of a multi-disciplinary discharge planning process, led by the attending physician.  Recommendations may be updated based on patient status, additional functional criteria and insurance authorization.  Follow Up Recommendations  Follow physician's recommendations for discharge plan and follow up therapies     Assistance Recommended at Discharge Intermittent Supervision/Assistance  Patient can return home with the following A little help with walking and/or transfers;A little help with bathing/dressing/bathroom;Assistance with cooking/housework;Help with stairs or ramp for entrance;Assist for transportation   Equipment Recommendations  Rolling walker (2 wheels)    Recommendations for Other Services       Precautions / Restrictions Precautions Precautions: Knee Precaution Booklet Issued: Yes (comment) Precaution Comments: Provided and reviewed handout Restrictions Weight Bearing Restrictions: Yes RLE Weight Bearing: Weight bearing as tolerated     Mobility  Bed Mobility Overal bed mobility: Needs Assistance Bed Mobility: Supine to Sit, Sit to Supine     Supine  to sit: Supervision Sit to supine: Supervision   General bed mobility comments: Educated on use of sheet to assist with RLE, able to complete without physical assistance into and out of bed.    Transfers Overall transfer level: Needs assistance Equipment used: Rolling walker (2 wheels) Transfers: Sit to/from Stand Sit to Stand: Supervision           General transfer comment: Good strength to power-up. States she has used bathroom independently.    Ambulation/Gait Ambulation/Gait assistance: Supervision Gait Distance (Feet): 60 Feet Assistive device: Rolling walker (2 wheels) Gait Pattern/deviations: Step-through pattern, Decreased stride length, Decreased stance time - right, Decreased weight shift to right, Antalgic Gait velocity: dec Gait velocity interpretation: <1.31 ft/sec, indicative of household ambulator   General Gait Details: Further cues for gait symmetry. Rt ankle eversion suspect due to wear of sandal she is wearing. Tolerated shorter distance well without buckling, still using fair amount of support from RW.   Stairs Stairs: Yes Stairs assistance: Min assist, Min guard Stair Management: No rails, Step to pattern, Backwards, With walker, One rail Right, Sideways Number of Stairs: 4 (x2) General stair comments: Reviewed posterior approach with RW, completes safely, pt verb understanding. Additional training for use of single rail on Rt similar to home set-up. Step to lateral approach, min guard for safety, seemingly more effortful for pt but without LOB or need for physical assist.   Wheelchair Mobility    Modified Rankin (Stroke Patients Only)       Balance Overall balance assessment: Needs assistance Sitting-balance support: No upper extremity supported, Feet supported Sitting balance-Leahy Scale: Good     Standing balance support: No upper extremity supported, During functional activity Standing balance-Leahy Scale: Fair Standing balance comment:  Reduced WB through RLE  Cognition Arousal/Alertness: Awake/alert Behavior During Therapy: WFL for tasks assessed/performed Overall Cognitive Status: Within Functional Limits for tasks assessed                                          Exercises Total Joint Exercises Ankle Circles/Pumps: AROM, Both, Supine, 10 reps Quad Sets: Strengthening, Both, 5 reps, Seated Towel Squeeze: Strengthening, Both, 5 reps, Seated Short Arc Quad: Right, AAROM, 5 reps, Seated Heel Slides: AAROM, Right, 5 reps, Seated Hip ABduction/ADduction: Strengthening, Both, 5 reps, Supine Goniometric ROM: 7-62 deg Rt knee flexion    General Comments General comments (skin integrity, edema, etc.): Reviewed precautions, answered questions/concerns.      Pertinent Vitals/Pain Pain Assessment Pain Assessment: Faces Pain Score: 8  Faces Pain Scale: Hurts even more Pain Location: Rt knee, earlier Rt groin pain but now better Pain Descriptors / Indicators: Aching Pain Intervention(s): Monitored during session, Repositioned, Limited activity within patient's tolerance    Home Living Family/patient expects to be discharged to:: Private residence Living Arrangements: Spouse/significant other Available Help at Discharge: Family;Available PRN/intermittently Type of Home: House Home Access: Stairs to enter Entrance Stairs-Rails: None Entrance Stairs-Number of Steps: 4   Home Layout: One level Home Equipment: BSC/3in1;Adaptive equipment Additional Comments: Plans to go in through front steps no rail. Husband to assist.    Prior Function            PT Goals (current goals can now be found in the care plan section) Acute Rehab PT Goals Patient Stated Goal: Get well, reduce pain PT Goal Formulation: With patient Time For Goal Achievement: 03/19/22 Potential to Achieve Goals: Good Progress towards PT goals: Progressing toward goals    Frequency     7X/week      PT Plan Current plan remains appropriate    Co-evaluation              AM-PAC PT "6 Clicks" Mobility   Outcome Measure  Help needed turning from your back to your side while in a flat bed without using bedrails?: A Little Help needed moving from lying on your back to sitting on the side of a flat bed without using bedrails?: A Little Help needed moving to and from a bed to a chair (including a wheelchair)?: A Little Help needed standing up from a chair using your arms (e.g., wheelchair or bedside chair)?: A Little Help needed to walk in hospital room?: A Little Help needed climbing 3-5 steps with a railing? : A Little 6 Click Score: 18    End of Session Equipment Utilized During Treatment: Gait belt Activity Tolerance: Patient tolerated treatment well Patient left: with call bell/phone within reach;in bed Nurse Communication: Mobility status PT Visit Diagnosis: Unsteadiness on feet (R26.81);Other abnormalities of gait and mobility (R26.89);Difficulty in walking, not elsewhere classified (R26.2);Pain Pain - Right/Left: Right Pain - part of body: Knee     Time: 2637-8588 PT Time Calculation (min) (ACUTE ONLY): 22 min  Charges:  $Gait Training: 8-22 mins                     Candie Mile, PT, DPT Physical Therapist Acute Rehabilitation Services [place] 03/12/2022, 1:56 PM

## 2022-03-13 ENCOUNTER — Telehealth: Payer: Self-pay

## 2022-03-13 ENCOUNTER — Telehealth: Payer: Self-pay | Admitting: *Deleted

## 2022-03-13 ENCOUNTER — Encounter (HOSPITAL_COMMUNITY): Payer: Self-pay | Admitting: Orthopaedic Surgery

## 2022-03-13 ENCOUNTER — Other Ambulatory Visit: Payer: Self-pay | Admitting: Orthopaedic Surgery

## 2022-03-13 MED ORDER — OXYCODONE HCL 5 MG PO TABS: 5.0000 mg | ORAL_TABLET | ORAL | 0 refills | Status: DC | PRN

## 2022-03-13 NOTE — Telephone Encounter (Signed)
Left VM updating patient that medication has been sent to new pharmacy.

## 2022-03-13 NOTE — Care Plan (Signed)
(  Late entry for 03/09/22 and 03/10/22)- Attempted call to patient 3 times prior to surgery to reach her to discuss Ortho bundle needs. No answer and left VM. Met with patient in hospital for first time after surgery on 03/11/22. Provided post op care instructions and card.

## 2022-03-13 NOTE — Telephone Encounter (Signed)
Ortho bundle D/C call attempted. Left VM requesting call back.

## 2022-03-13 NOTE — Telephone Encounter (Signed)
Copied from [place] [id number]. Topic: General - Other >> Mar 05, 2022  5:03 PM Sabas Sous wrote: Reason for CRM: Jenecia calling from [facility] is requesting to speak to a nurse regarding clinical information. She is faxing the information over now, wants to confirm if office receives it. Please advise   Best contact: [phone]

## 2022-03-13 NOTE — Telephone Encounter (Signed)
Patient called stating she couldn't pick up her Rx of Oxycodone 5 mg yesterday because her pharmacy was out of this. She asked this be sent to [facility] in [place]. Thank you.

## 2022-03-14 ENCOUNTER — Other Ambulatory Visit: Payer: Self-pay | Admitting: Orthopaedic Surgery

## 2022-03-14 ENCOUNTER — Telehealth: Payer: Self-pay | Admitting: *Deleted

## 2022-03-14 MED ORDER — OXYCODONE HCL 5 MG PO TABS: 5.0000 mg | ORAL_TABLET | ORAL | 0 refills | Status: DC | PRN

## 2022-03-14 NOTE — Telephone Encounter (Signed)
Spoke to patient today after d/c this week. Got new Rx of pain medication from pharmacy yesterday. She is taking 1-2 (mostly 2 ) every 4 hours. She will most likely need refill by Sunday and we are closed Monday. Pharmacy is [facility]. Thank you.

## 2022-03-14 NOTE — Telephone Encounter (Signed)
Spoke with patient about 10 minutes ago and then received call back from her after hanging up. She was very upset stating she has just found out her 57 yo sister has died. She is very emotional and was not able to get her HHPT eval done today. I let [facility] know that her appointment today will need to be canceled and rescheduled for over the weekend. Just as an Pharmacist, hospital.

## 2022-03-16 DIAGNOSIS — Z79899 Other long term (current) drug therapy: Secondary | ICD-10-CM | POA: Diagnosis not present

## 2022-03-16 DIAGNOSIS — Z9181 History of falling: Secondary | ICD-10-CM | POA: Diagnosis not present

## 2022-03-16 DIAGNOSIS — Z96651 Presence of right artificial knee joint: Secondary | ICD-10-CM | POA: Diagnosis not present

## 2022-03-16 DIAGNOSIS — Z791 Long term (current) use of non-steroidal anti-inflammatories (NSAID): Secondary | ICD-10-CM | POA: Diagnosis not present

## 2022-03-16 DIAGNOSIS — Z8616 Personal history of COVID-19: Secondary | ICD-10-CM | POA: Diagnosis not present

## 2022-03-16 DIAGNOSIS — J4489 Other specified chronic obstructive pulmonary disease: Secondary | ICD-10-CM | POA: Diagnosis not present

## 2022-03-16 DIAGNOSIS — Z7951 Long term (current) use of inhaled steroids: Secondary | ICD-10-CM | POA: Diagnosis not present

## 2022-03-16 DIAGNOSIS — I10 Essential (primary) hypertension: Secondary | ICD-10-CM | POA: Diagnosis not present

## 2022-03-16 DIAGNOSIS — J4520 Mild intermittent asthma, uncomplicated: Secondary | ICD-10-CM | POA: Diagnosis not present

## 2022-03-16 DIAGNOSIS — G43909 Migraine, unspecified, not intractable, without status migrainosus: Secondary | ICD-10-CM | POA: Diagnosis not present

## 2022-03-16 DIAGNOSIS — Z471 Aftercare following joint replacement surgery: Secondary | ICD-10-CM | POA: Diagnosis not present

## 2022-03-16 DIAGNOSIS — N3281 Overactive bladder: Secondary | ICD-10-CM | POA: Diagnosis not present

## 2022-03-16 DIAGNOSIS — Z981 Arthrodesis status: Secondary | ICD-10-CM | POA: Diagnosis not present

## 2022-03-16 DIAGNOSIS — M4802 Spinal stenosis, cervical region: Secondary | ICD-10-CM | POA: Diagnosis not present

## 2022-03-16 DIAGNOSIS — M4722 Other spondylosis with radiculopathy, cervical region: Secondary | ICD-10-CM | POA: Diagnosis not present

## 2022-03-16 DIAGNOSIS — Z7984 Long term (current) use of oral hypoglycemic drugs: Secondary | ICD-10-CM | POA: Diagnosis not present

## 2022-03-16 DIAGNOSIS — M47814 Spondylosis without myelopathy or radiculopathy, thoracic region: Secondary | ICD-10-CM | POA: Diagnosis not present

## 2022-03-16 DIAGNOSIS — R7303 Prediabetes: Secondary | ICD-10-CM | POA: Diagnosis not present

## 2022-03-18 ENCOUNTER — Other Ambulatory Visit: Payer: Self-pay | Admitting: *Deleted

## 2022-03-18 ENCOUNTER — Other Ambulatory Visit: Payer: Self-pay | Admitting: Orthopaedic Surgery

## 2022-03-18 ENCOUNTER — Telehealth: Payer: Self-pay | Admitting: *Deleted

## 2022-03-18 DIAGNOSIS — Z96651 Presence of right artificial knee joint: Secondary | ICD-10-CM

## 2022-03-18 DIAGNOSIS — M1711 Unilateral primary osteoarthritis, right knee: Secondary | ICD-10-CM

## 2022-03-18 MED ORDER — HYDROMORPHONE HCL 4 MG PO TABS: 4.0000 mg | ORAL_TABLET | ORAL | 0 refills | Status: DC | PRN

## 2022-03-18 NOTE — Telephone Encounter (Signed)
Patient called stating she is really frustrated because her pain medication is having no effect on her pain. She reports taking 2 at a time every 4 hours and gets no relief. Yesterday she reports she took 4 at one time, which provided some relief. She is taking Zanaflex, Gabapentin, and has Lyrica prescribed by a different provider, but is nervous about taking that with the Gabapentin due to the pharmacist telling her she shouldn't be taking both. I told her I'd pass this along. She is asking for something different for pain and states she "has a high tolerance to medications not pain".

## 2022-03-18 NOTE — Telephone Encounter (Signed)
Call to patient and updated. 

## 2022-03-19 ENCOUNTER — Other Ambulatory Visit: Payer: Self-pay | Admitting: Orthopaedic Surgery

## 2022-03-20 DIAGNOSIS — I10 Essential (primary) hypertension: Secondary | ICD-10-CM | POA: Diagnosis not present

## 2022-03-20 DIAGNOSIS — R7303 Prediabetes: Secondary | ICD-10-CM | POA: Diagnosis not present

## 2022-03-20 DIAGNOSIS — Z79899 Other long term (current) drug therapy: Secondary | ICD-10-CM | POA: Diagnosis not present

## 2022-03-20 DIAGNOSIS — Z96651 Presence of right artificial knee joint: Secondary | ICD-10-CM | POA: Diagnosis not present

## 2022-03-20 DIAGNOSIS — M4802 Spinal stenosis, cervical region: Secondary | ICD-10-CM | POA: Diagnosis not present

## 2022-03-20 DIAGNOSIS — J4520 Mild intermittent asthma, uncomplicated: Secondary | ICD-10-CM | POA: Diagnosis not present

## 2022-03-20 DIAGNOSIS — Z791 Long term (current) use of non-steroidal anti-inflammatories (NSAID): Secondary | ICD-10-CM | POA: Diagnosis not present

## 2022-03-20 DIAGNOSIS — Z7951 Long term (current) use of inhaled steroids: Secondary | ICD-10-CM | POA: Diagnosis not present

## 2022-03-20 DIAGNOSIS — M47814 Spondylosis without myelopathy or radiculopathy, thoracic region: Secondary | ICD-10-CM | POA: Diagnosis not present

## 2022-03-20 DIAGNOSIS — Z471 Aftercare following joint replacement surgery: Secondary | ICD-10-CM | POA: Diagnosis not present

## 2022-03-20 DIAGNOSIS — N3281 Overactive bladder: Secondary | ICD-10-CM | POA: Diagnosis not present

## 2022-03-20 DIAGNOSIS — Z8616 Personal history of COVID-19: Secondary | ICD-10-CM | POA: Diagnosis not present

## 2022-03-20 DIAGNOSIS — G43909 Migraine, unspecified, not intractable, without status migrainosus: Secondary | ICD-10-CM | POA: Diagnosis not present

## 2022-03-20 DIAGNOSIS — Z981 Arthrodesis status: Secondary | ICD-10-CM | POA: Diagnosis not present

## 2022-03-20 DIAGNOSIS — M4722 Other spondylosis with radiculopathy, cervical region: Secondary | ICD-10-CM | POA: Diagnosis not present

## 2022-03-20 DIAGNOSIS — J4489 Other specified chronic obstructive pulmonary disease: Secondary | ICD-10-CM | POA: Diagnosis not present

## 2022-03-20 DIAGNOSIS — Z7984 Long term (current) use of oral hypoglycemic drugs: Secondary | ICD-10-CM | POA: Diagnosis not present

## 2022-03-20 DIAGNOSIS — Z9181 History of falling: Secondary | ICD-10-CM | POA: Diagnosis not present

## 2022-03-21 DIAGNOSIS — M4802 Spinal stenosis, cervical region: Secondary | ICD-10-CM | POA: Diagnosis not present

## 2022-03-21 DIAGNOSIS — Z7984 Long term (current) use of oral hypoglycemic drugs: Secondary | ICD-10-CM | POA: Diagnosis not present

## 2022-03-21 DIAGNOSIS — Z79899 Other long term (current) drug therapy: Secondary | ICD-10-CM | POA: Diagnosis not present

## 2022-03-21 DIAGNOSIS — N3281 Overactive bladder: Secondary | ICD-10-CM | POA: Diagnosis not present

## 2022-03-21 DIAGNOSIS — I10 Essential (primary) hypertension: Secondary | ICD-10-CM | POA: Diagnosis not present

## 2022-03-21 DIAGNOSIS — M47814 Spondylosis without myelopathy or radiculopathy, thoracic region: Secondary | ICD-10-CM | POA: Diagnosis not present

## 2022-03-21 DIAGNOSIS — Z471 Aftercare following joint replacement surgery: Secondary | ICD-10-CM | POA: Diagnosis not present

## 2022-03-21 DIAGNOSIS — M4722 Other spondylosis with radiculopathy, cervical region: Secondary | ICD-10-CM | POA: Diagnosis not present

## 2022-03-21 DIAGNOSIS — G43909 Migraine, unspecified, not intractable, without status migrainosus: Secondary | ICD-10-CM | POA: Diagnosis not present

## 2022-03-21 DIAGNOSIS — Z96651 Presence of right artificial knee joint: Secondary | ICD-10-CM | POA: Diagnosis not present

## 2022-03-21 DIAGNOSIS — Z9181 History of falling: Secondary | ICD-10-CM | POA: Diagnosis not present

## 2022-03-21 DIAGNOSIS — Z791 Long term (current) use of non-steroidal anti-inflammatories (NSAID): Secondary | ICD-10-CM | POA: Diagnosis not present

## 2022-03-21 DIAGNOSIS — Z981 Arthrodesis status: Secondary | ICD-10-CM | POA: Diagnosis not present

## 2022-03-21 DIAGNOSIS — J4489 Other specified chronic obstructive pulmonary disease: Secondary | ICD-10-CM | POA: Diagnosis not present

## 2022-03-21 DIAGNOSIS — R7303 Prediabetes: Secondary | ICD-10-CM | POA: Diagnosis not present

## 2022-03-21 DIAGNOSIS — J4520 Mild intermittent asthma, uncomplicated: Secondary | ICD-10-CM | POA: Diagnosis not present

## 2022-03-21 DIAGNOSIS — Z8616 Personal history of COVID-19: Secondary | ICD-10-CM | POA: Diagnosis not present

## 2022-03-21 DIAGNOSIS — Z7951 Long term (current) use of inhaled steroids: Secondary | ICD-10-CM | POA: Diagnosis not present

## 2022-03-22 ENCOUNTER — Other Ambulatory Visit: Payer: Self-pay | Admitting: Orthopaedic Surgery

## 2022-03-23 ENCOUNTER — Encounter: Payer: Self-pay | Admitting: Family Medicine

## 2022-03-24 ENCOUNTER — Other Ambulatory Visit: Payer: Self-pay | Admitting: Orthopaedic Surgery

## 2022-03-24 ENCOUNTER — Ambulatory Visit (INDEPENDENT_AMBULATORY_CARE_PROVIDER_SITE_OTHER): Payer: Medicare Other | Admitting: Orthopaedic Surgery

## 2022-03-24 ENCOUNTER — Encounter: Payer: Self-pay | Admitting: Orthopaedic Surgery

## 2022-03-24 ENCOUNTER — Telehealth: Payer: Self-pay | Admitting: Orthopaedic Surgery

## 2022-03-24 DIAGNOSIS — M4722 Other spondylosis with radiculopathy, cervical region: Secondary | ICD-10-CM | POA: Diagnosis not present

## 2022-03-24 DIAGNOSIS — Z9181 History of falling: Secondary | ICD-10-CM | POA: Diagnosis not present

## 2022-03-24 DIAGNOSIS — G43909 Migraine, unspecified, not intractable, without status migrainosus: Secondary | ICD-10-CM | POA: Diagnosis not present

## 2022-03-24 DIAGNOSIS — Z471 Aftercare following joint replacement surgery: Secondary | ICD-10-CM | POA: Diagnosis not present

## 2022-03-24 DIAGNOSIS — Z7984 Long term (current) use of oral hypoglycemic drugs: Secondary | ICD-10-CM | POA: Diagnosis not present

## 2022-03-24 DIAGNOSIS — M47814 Spondylosis without myelopathy or radiculopathy, thoracic region: Secondary | ICD-10-CM | POA: Diagnosis not present

## 2022-03-24 DIAGNOSIS — J4489 Other specified chronic obstructive pulmonary disease: Secondary | ICD-10-CM | POA: Diagnosis not present

## 2022-03-24 DIAGNOSIS — Z96651 Presence of right artificial knee joint: Secondary | ICD-10-CM

## 2022-03-24 DIAGNOSIS — Z79899 Other long term (current) drug therapy: Secondary | ICD-10-CM | POA: Diagnosis not present

## 2022-03-24 DIAGNOSIS — R7303 Prediabetes: Secondary | ICD-10-CM | POA: Diagnosis not present

## 2022-03-24 DIAGNOSIS — I10 Essential (primary) hypertension: Secondary | ICD-10-CM | POA: Diagnosis not present

## 2022-03-24 DIAGNOSIS — M4802 Spinal stenosis, cervical region: Secondary | ICD-10-CM | POA: Diagnosis not present

## 2022-03-24 DIAGNOSIS — Z8616 Personal history of COVID-19: Secondary | ICD-10-CM | POA: Diagnosis not present

## 2022-03-24 DIAGNOSIS — Z981 Arthrodesis status: Secondary | ICD-10-CM | POA: Diagnosis not present

## 2022-03-24 DIAGNOSIS — N3281 Overactive bladder: Secondary | ICD-10-CM | POA: Diagnosis not present

## 2022-03-24 DIAGNOSIS — J4520 Mild intermittent asthma, uncomplicated: Secondary | ICD-10-CM | POA: Diagnosis not present

## 2022-03-24 DIAGNOSIS — Z7951 Long term (current) use of inhaled steroids: Secondary | ICD-10-CM | POA: Diagnosis not present

## 2022-03-24 DIAGNOSIS — Z791 Long term (current) use of non-steroidal anti-inflammatories (NSAID): Secondary | ICD-10-CM | POA: Diagnosis not present

## 2022-03-24 MED ORDER — OXYCODONE HCL 5 MG PO TABS: 5.0000 mg | ORAL_TABLET | ORAL | 0 refills | Status: DC | PRN

## 2022-03-24 NOTE — Progress Notes (Signed)
The patient is here for her first postoperative visit status post a right total knee arthroplasty.  She reports good range of motion and strength.  She has been on Dilaudid but needs to now get down to oxycodone per her request.  She is to transition to outpatient physical therapy in another week and that is been set up for her in [place].  She is pleased overall.  On exam her calf is soft.  She has good flexion extension of her right knee.  She is moving it better and bending better than most patients at 2 weeks.  I am very pleased overall.  Her staples are removed and Steri-Strips applied.  She has good flexion extension of her ankle and foot as well.  From my standpoint she will transition to outpatient physical therapy.  I sent in some oxycodone for pain.  Will see her back in a month to see how she is doing overall but no x-rays are needed.

## 2022-03-24 NOTE — Telephone Encounter (Signed)
Patient needs her oxycodone sent  to [facility] [place] [facility] [address] [facility]

## 2022-03-25 ENCOUNTER — Other Ambulatory Visit: Payer: Self-pay | Admitting: Family Medicine

## 2022-03-25 DIAGNOSIS — N3946 Mixed incontinence: Secondary | ICD-10-CM

## 2022-03-26 ENCOUNTER — Telehealth: Payer: Self-pay | Admitting: *Deleted

## 2022-03-26 DIAGNOSIS — J4520 Mild intermittent asthma, uncomplicated: Secondary | ICD-10-CM | POA: Diagnosis not present

## 2022-03-26 DIAGNOSIS — Z7951 Long term (current) use of inhaled steroids: Secondary | ICD-10-CM | POA: Diagnosis not present

## 2022-03-26 DIAGNOSIS — M4802 Spinal stenosis, cervical region: Secondary | ICD-10-CM | POA: Diagnosis not present

## 2022-03-26 DIAGNOSIS — I10 Essential (primary) hypertension: Secondary | ICD-10-CM | POA: Diagnosis not present

## 2022-03-26 DIAGNOSIS — R7303 Prediabetes: Secondary | ICD-10-CM | POA: Diagnosis not present

## 2022-03-26 DIAGNOSIS — M4722 Other spondylosis with radiculopathy, cervical region: Secondary | ICD-10-CM | POA: Diagnosis not present

## 2022-03-26 DIAGNOSIS — Z96651 Presence of right artificial knee joint: Secondary | ICD-10-CM | POA: Diagnosis not present

## 2022-03-26 DIAGNOSIS — M47814 Spondylosis without myelopathy or radiculopathy, thoracic region: Secondary | ICD-10-CM | POA: Diagnosis not present

## 2022-03-26 DIAGNOSIS — Z9181 History of falling: Secondary | ICD-10-CM | POA: Diagnosis not present

## 2022-03-26 DIAGNOSIS — Z8616 Personal history of COVID-19: Secondary | ICD-10-CM | POA: Diagnosis not present

## 2022-03-26 DIAGNOSIS — J4489 Other specified chronic obstructive pulmonary disease: Secondary | ICD-10-CM | POA: Diagnosis not present

## 2022-03-26 DIAGNOSIS — Z471 Aftercare following joint replacement surgery: Secondary | ICD-10-CM | POA: Diagnosis not present

## 2022-03-26 DIAGNOSIS — Z981 Arthrodesis status: Secondary | ICD-10-CM | POA: Diagnosis not present

## 2022-03-26 DIAGNOSIS — G43909 Migraine, unspecified, not intractable, without status migrainosus: Secondary | ICD-10-CM | POA: Diagnosis not present

## 2022-03-26 DIAGNOSIS — N3281 Overactive bladder: Secondary | ICD-10-CM | POA: Diagnosis not present

## 2022-03-26 DIAGNOSIS — Z79899 Other long term (current) drug therapy: Secondary | ICD-10-CM | POA: Diagnosis not present

## 2022-03-26 DIAGNOSIS — Z7984 Long term (current) use of oral hypoglycemic drugs: Secondary | ICD-10-CM | POA: Diagnosis not present

## 2022-03-26 DIAGNOSIS — Z791 Long term (current) use of non-steroidal anti-inflammatories (NSAID): Secondary | ICD-10-CM | POA: Diagnosis not present

## 2022-03-26 NOTE — Telephone Encounter (Signed)
14 day in office visit completed- Ortho bundle.

## 2022-03-27 ENCOUNTER — Telehealth: Payer: Self-pay | Admitting: *Deleted

## 2022-03-27 ENCOUNTER — Other Ambulatory Visit: Payer: Self-pay | Admitting: Orthopaedic Surgery

## 2022-03-27 MED ORDER — OXYCODONE HCL 5 MG PO TABS: 5.0000 mg | ORAL_TABLET | ORAL | 0 refills | Status: DC | PRN

## 2022-03-27 NOTE — Telephone Encounter (Signed)
Patient left VM stating she will need additional pain medication over the weekend most likely. She also requested additional Home health due to transportation issues. I explained that it is more appropriate to proceed with OPPT at this time than to continue with HHPT. Thanks.

## 2022-03-28 ENCOUNTER — Other Ambulatory Visit: Payer: Self-pay | Admitting: Orthopaedic Surgery

## 2022-03-28 DIAGNOSIS — J4489 Other specified chronic obstructive pulmonary disease: Secondary | ICD-10-CM | POA: Diagnosis not present

## 2022-03-28 DIAGNOSIS — N3281 Overactive bladder: Secondary | ICD-10-CM | POA: Diagnosis not present

## 2022-03-28 DIAGNOSIS — I10 Essential (primary) hypertension: Secondary | ICD-10-CM | POA: Diagnosis not present

## 2022-03-28 DIAGNOSIS — Z8616 Personal history of COVID-19: Secondary | ICD-10-CM | POA: Diagnosis not present

## 2022-03-28 DIAGNOSIS — Z981 Arthrodesis status: Secondary | ICD-10-CM | POA: Diagnosis not present

## 2022-03-28 DIAGNOSIS — Z79899 Other long term (current) drug therapy: Secondary | ICD-10-CM | POA: Diagnosis not present

## 2022-03-28 DIAGNOSIS — M4802 Spinal stenosis, cervical region: Secondary | ICD-10-CM | POA: Diagnosis not present

## 2022-03-28 DIAGNOSIS — J4520 Mild intermittent asthma, uncomplicated: Secondary | ICD-10-CM | POA: Diagnosis not present

## 2022-03-28 DIAGNOSIS — G43909 Migraine, unspecified, not intractable, without status migrainosus: Secondary | ICD-10-CM | POA: Diagnosis not present

## 2022-03-28 DIAGNOSIS — R7303 Prediabetes: Secondary | ICD-10-CM | POA: Diagnosis not present

## 2022-03-28 DIAGNOSIS — Z96651 Presence of right artificial knee joint: Secondary | ICD-10-CM | POA: Diagnosis not present

## 2022-03-28 DIAGNOSIS — Z9181 History of falling: Secondary | ICD-10-CM | POA: Diagnosis not present

## 2022-03-28 DIAGNOSIS — Z471 Aftercare following joint replacement surgery: Secondary | ICD-10-CM | POA: Diagnosis not present

## 2022-03-28 DIAGNOSIS — M47814 Spondylosis without myelopathy or radiculopathy, thoracic region: Secondary | ICD-10-CM | POA: Diagnosis not present

## 2022-03-28 DIAGNOSIS — Z7951 Long term (current) use of inhaled steroids: Secondary | ICD-10-CM | POA: Diagnosis not present

## 2022-03-28 DIAGNOSIS — Z791 Long term (current) use of non-steroidal anti-inflammatories (NSAID): Secondary | ICD-10-CM | POA: Diagnosis not present

## 2022-03-28 DIAGNOSIS — M4722 Other spondylosis with radiculopathy, cervical region: Secondary | ICD-10-CM | POA: Diagnosis not present

## 2022-03-28 DIAGNOSIS — Z7984 Long term (current) use of oral hypoglycemic drugs: Secondary | ICD-10-CM | POA: Diagnosis not present

## 2022-03-29 ENCOUNTER — Encounter: Payer: Self-pay | Admitting: Orthopaedic Surgery

## 2022-03-31 ENCOUNTER — Other Ambulatory Visit: Payer: Self-pay | Admitting: Orthopaedic Surgery

## 2022-03-31 MED ORDER — OXYCODONE HCL 5 MG PO TABS: 5.0000 mg | ORAL_TABLET | ORAL | 0 refills | Status: DC | PRN

## 2022-04-02 NOTE — Therapy (Incomplete)
OUTPATIENT PHYSICAL THERAPY LOWER EXTREMITY EVALUATION   Patient Name: Megan Jimenez MRN: [id number] DOB:08/11/1965, 57 y.o., female Today's Date: 04/02/2022  END OF SESSION:   Past Medical History:  Diagnosis Date   Anxiety    Arthritis    Asthma    Bipolar 1 disorder ([place])    COVID-19 03/29/2021   Family history of breast cancer    Family history of colon cancer    Family history of leukemia    Family history of lung cancer    Family history of ovarian cancer    Family history of throat cancer    Pre-diabetes    Past Surgical History:  Procedure Laterality Date   ANKLE FRACTURE SURGERY Right 1992   ANTERIOR CERVICAL DECOMP/DISCECTOMY FUSION N/A 04/23/2021   Procedure: ANTERIOR CERVICAL DISCECTOMIES AND FUSION C4-5, C5-6 AND C6-7 WITH PLATES, SCREWS, ALLOGRAFT ACDF GRAFT, LOCAL BONE GRAFT, VIVIGEN;  Surgeon: Jessy Oto, MD;  Location: [place];  Service: Orthopedics;  Laterality: N/A;   CARPAL TUNNEL RELEASE Left 04/23/2021   Procedure: LEFT OPEN CARPAL TUNNEL RELEASE;  Surgeon: Jessy Oto, MD;  Location: [place];  Service: Orthopedics;  Laterality: Left;   CHOLECYSTECTOMY     COLONOSCOPY WITH PROPOFOL N/A 07/09/2020   Procedure: COLONOSCOPY WITH PROPOFOL;  Surgeon: Jonathon Bellows, MD;  Location[facility] ENDOSCOPY;  Service: Gastroenterology;  Laterality: N/A;   TOTAL KNEE ARTHROPLASTY Right 03/11/2022   Procedure: RIGHT TOTAL KNEE ARTHROPLASTY;  Surgeon: Mcarthur Rossetti, MD;  Location: [place];  Service: Orthopedics;  Laterality: Right;  Needs RNFA   Patient Active Problem List   Diagnosis Date Noted   Status post total right knee replacement 03/11/2022   Status post total knee replacement 03/11/2022   Cervical radiculopathy 01/22/2022   Unilateral primary osteoarthritis, right knee 12/25/2021   Prediabetes 11/01/2021   OAB (overactive bladder) 08/22/2021   Vaginal atrophy 08/22/2021   Primary hypertension 08/22/2021   Hyperglycemia 07/24/2021   Morbid obesity  ([place]) 06/14/2021   Asthmatic bronchitis , chronic 05/16/2021   Herniated nucleus pulposus, cervical 04/23/2021    Class: Chronic   Spinal stenosis of cervical region 04/23/2021    Class: Chronic   S/P cervical spinal fusion 04/23/2021   Carpal tunnel syndrome, left upper limb 02/28/2021   Genetic testing 12/12/2020   Other spondylosis with radiculopathy, cervical region 11/29/2020   Osteoarthritis of thoracic spine 11/29/2020   Abnormal gait 11/29/2020   Paresthesia 11/29/2020   Need for shingles vaccine 11/29/2020   Need for hepatitis C screening test 11/29/2020   Weakness of both lower extremities 11/29/2020   Muscle spasm of back 11/29/2020   Mild intermittent asthma without complication 27/05/5007   Family history of breast cancer 11/28/2020   Family history of ovarian cancer 11/28/2020   Family history of colon cancer 11/28/2020   Family history of throat cancer 11/28/2020   Family history of lung cancer 11/28/2020   Family history of leukemia 11/28/2020   Allergic rhinitis 11/17/2014   Anxiety 09/08/2014   Acute asthma exacerbation 09/08/2014   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/08/2014   Headache, migraine 09/08/2014   Excess, menstruation 09/08/2014    PCP: ***  REFERRING PROVIDER: ***  REFERRING DIAG: ***  THERAPY DIAG:  No diagnosis found.  Rationale for Evaluation and Treatment: {HABREHAB:27488}  ONSET DATE: ***  SUBJECTIVE:   SUBJECTIVE STATEMENT: ***  PERTINENT HISTORY: *** PAIN:  Are you having pain? {OPRCPAIN:27236}  PRECAUTIONS: {Therapy precautions:24002}  WEIGHT BEARING RESTRICTIONS: {Yes ***/No:24003}  FALLS:  Has patient fallen in last  6 months? {fallsyesno:27318}  LIVING ENVIRONMENT: Lives with: {OPRC lives with:25569::"lives with their family"} Lives in: {Lives in:25570} Stairs: {opstairs:27293} Has following equipment at home: {Assistive devices:23999}  OCCUPATION: ***  PLOF: {PLOF:24004}  PATIENT GOALS: ***  NEXT MD VISIT:    OBJECTIVE:   DIAGNOSTIC FINDINGS: ***  PATIENT SURVEYS:  {rehab surveys:24030}  COGNITION: Overall cognitive status: {cognition:24006}     SENSATION: {sensation:27233}  EDEMA:  {edema:24020}  MUSCLE LENGTH: Hamstrings: Right *** deg; Left *** deg Thomas test: Right *** deg; Left *** deg  POSTURE: {posture:25561}  PALPATION: ***  LOWER EXTREMITY ROM:  {AROM/PROM:27142} ROM Right eval Left eval  Hip flexion    Hip extension    Hip abduction    Hip adduction    Hip internal rotation    Hip external rotation    Knee flexion    Knee extension    Ankle dorsiflexion    Ankle plantarflexion    Ankle inversion    Ankle eversion     (Blank rows = not tested)  LOWER EXTREMITY MMT:  MMT Right eval Left eval  Hip flexion    Hip extension    Hip abduction    Hip adduction    Hip internal rotation    Hip external rotation    Knee flexion    Knee extension    Ankle dorsiflexion    Ankle plantarflexion    Ankle inversion    Ankle eversion     (Blank rows = not tested)  LOWER EXTREMITY SPECIAL TESTS:  {LEspecialtests:26242}  FUNCTIONAL TESTS:  {Functional tests:24029}  GAIT: Distance walked: *** Assistive device utilized: {Assistive devices:23999} Level of assistance: {Levels of assistance:24026} Comments: ***   TODAY'S TREATMENT:                                                                                                                              DATE: ***    PATIENT EDUCATION:  Education details: *** Person educated: {Person educated:25204} Education method: {Education Method:25205} Education comprehension: {Education Comprehension:25206}  HOME EXERCISE PROGRAM: ***  ASSESSMENT:  CLINICAL IMPRESSION: Patient is a *** y.o. *** who was seen today for physical therapy evaluation and treatment for ***.   OBJECTIVE IMPAIRMENTS: {opptimpairments:25111}.   ACTIVITY LIMITATIONS: {activitylimitations:27494}  PARTICIPATION LIMITATIONS:  {participationrestrictions:25113}  PERSONAL FACTORS: {Personal factors:25162} are also affecting patient's functional outcome.   REHAB POTENTIAL: {rehabpotential:25112}  CLINICAL DECISION MAKING: {clinical decision making:25114}  EVALUATION COMPLEXITY: {Evaluation complexity:25115}   GOALS: Goals reviewed with patient? {yes/no:20286}  SHORT TERM GOALS: Target date: *** *** Baseline: Goal status: {GOALSTATUS:25110}  2.  *** Baseline:  Goal status: {GOALSTATUS:25110}  3.  *** Baseline:  Goal status: {GOALSTATUS:25110}  4.  *** Baseline:  Goal status: {GOALSTATUS:25110}  5.  *** Baseline:  Goal status: {GOALSTATUS:25110}  6.  *** Baseline:  Goal status: {GOALSTATUS:25110}  LONG TERM GOALS: Target date: ***  *** Baseline:  Goal status: {GOALSTATUS:25110}  2.  *** Baseline:  Goal status: {GOALSTATUS:25110}  3.  ***  Baseline:  Goal status: {GOALSTATUS:25110}  4.  *** Baseline:  Goal status: {GOALSTATUS:25110}  5.  *** Baseline:  Goal status: {GOALSTATUS:25110}  6.  *** Baseline:  Goal status: {GOALSTATUS:25110}   PLAN:  PT FREQUENCY: {rehab frequency:25116}  PT DURATION: {rehab duration:25117}  PLANNED INTERVENTIONS: {rehab planned interventions:25118::"Therapeutic exercises","Therapeutic activity","Neuromuscular re-education","Balance training","Gait training","Patient/Family education","Self Care","Joint mobilization"}  PLAN FOR NEXT SESSION: ***   Ardis Rowan, PT 04/02/2022, 7:53 AM

## 2022-04-03 ENCOUNTER — Ambulatory Visit (HOSPITAL_COMMUNITY): Payer: Medicare Other

## 2022-04-04 ENCOUNTER — Other Ambulatory Visit: Payer: Self-pay | Admitting: Orthopaedic Surgery

## 2022-04-04 ENCOUNTER — Telehealth: Payer: Self-pay | Admitting: *Deleted

## 2022-04-04 MED ORDER — OXYCODONE HCL 5 MG PO TABS: 5.0000 mg | ORAL_TABLET | ORAL | 0 refills | Status: DC | PRN

## 2022-04-04 NOTE — Telephone Encounter (Signed)
Patient requesting refill of pain medication. Pharmacy [facility] in [place]. Thanks.

## 2022-04-07 ENCOUNTER — Ambulatory Visit: Payer: Medicare Other | Attending: Family Medicine

## 2022-04-07 DIAGNOSIS — M1711 Unilateral primary osteoarthritis, right knee: Secondary | ICD-10-CM | POA: Diagnosis not present

## 2022-04-07 DIAGNOSIS — Z96651 Presence of right artificial knee joint: Secondary | ICD-10-CM | POA: Diagnosis not present

## 2022-04-07 DIAGNOSIS — R269 Unspecified abnormalities of gait and mobility: Secondary | ICD-10-CM | POA: Diagnosis not present

## 2022-04-07 DIAGNOSIS — R262 Difficulty in walking, not elsewhere classified: Secondary | ICD-10-CM | POA: Insufficient documentation

## 2022-04-07 DIAGNOSIS — R29898 Other symptoms and signs involving the musculoskeletal system: Secondary | ICD-10-CM | POA: Insufficient documentation

## 2022-04-07 DIAGNOSIS — M5459 Other low back pain: Secondary | ICD-10-CM | POA: Diagnosis not present

## 2022-04-07 DIAGNOSIS — M25661 Stiffness of right knee, not elsewhere classified: Secondary | ICD-10-CM | POA: Insufficient documentation

## 2022-04-07 DIAGNOSIS — M25561 Pain in right knee: Secondary | ICD-10-CM | POA: Insufficient documentation

## 2022-04-07 DIAGNOSIS — M6281 Muscle weakness (generalized): Secondary | ICD-10-CM | POA: Insufficient documentation

## 2022-04-07 DIAGNOSIS — M542 Cervicalgia: Secondary | ICD-10-CM | POA: Diagnosis not present

## 2022-04-07 NOTE — Therapy (Signed)
OUTPATIENT PHYSICAL THERAPY LOWER EXTREMITY EVALUATION   Patient Name: Megan Jimenez MRN: [id number] DOB:1966/01/05, 57 y.o., female Today's Date: 04/07/2022  END OF SESSION:   Past Medical History:  Diagnosis Date   Anxiety    Arthritis    Asthma    Bipolar 1 disorder ([place])    COVID-19 03/29/2021   Family history of breast cancer    Family history of colon cancer    Family history of leukemia    Family history of lung cancer    Family history of ovarian cancer    Family history of throat cancer    Pre-diabetes    Past Surgical History:  Procedure Laterality Date   ANKLE FRACTURE SURGERY Right 1992   ANTERIOR CERVICAL DECOMP/DISCECTOMY FUSION N/A 04/23/2021   Procedure: ANTERIOR CERVICAL DISCECTOMIES AND FUSION C4-5, C5-6 AND C6-7 WITH PLATES, SCREWS, ALLOGRAFT ACDF GRAFT, LOCAL BONE GRAFT, VIVIGEN;  Surgeon: Jessy Oto, MD;  Location: [place];  Service: Orthopedics;  Laterality: N/A;   CARPAL TUNNEL RELEASE Left 04/23/2021   Procedure: LEFT OPEN CARPAL TUNNEL RELEASE;  Surgeon: Jessy Oto, MD;  Location: [place];  Service: Orthopedics;  Laterality: Left;   CHOLECYSTECTOMY     COLONOSCOPY WITH PROPOFOL N/A 07/09/2020   Procedure: COLONOSCOPY WITH PROPOFOL;  Surgeon: Jonathon Bellows, MD;  Location: [facility] ENDOSCOPY;  Service: Gastroenterology;  Laterality: N/A;   TOTAL KNEE ARTHROPLASTY Right 03/11/2022   Procedure: RIGHT TOTAL KNEE ARTHROPLASTY;  Surgeon: Mcarthur Rossetti, MD;  Location: [place];  Service: Orthopedics;  Laterality: Right;  Needs RNFA   Patient Active Problem List   Diagnosis Date Noted   Status post total right knee replacement 03/11/2022   Status post total knee replacement 03/11/2022   Cervical radiculopathy 01/22/2022   Unilateral primary osteoarthritis, right knee 12/25/2021   Prediabetes 11/01/2021   OAB (overactive bladder) 08/22/2021   Vaginal atrophy 08/22/2021   Primary hypertension 08/22/2021   Hyperglycemia 07/24/2021   Morbid obesity  ([place]) 06/14/2021   Asthmatic bronchitis , chronic 05/16/2021   Herniated nucleus pulposus, cervical 04/23/2021    Class: Chronic   Spinal stenosis of cervical region 04/23/2021    Class: Chronic   S/P cervical spinal fusion 04/23/2021   Carpal tunnel syndrome, left upper limb 02/28/2021   Genetic testing 12/12/2020   Other spondylosis with radiculopathy, cervical region 11/29/2020   Osteoarthritis of thoracic spine 11/29/2020   Abnormal gait 11/29/2020   Paresthesia 11/29/2020   Need for shingles vaccine 11/29/2020   Need for hepatitis C screening test 11/29/2020   Weakness of both lower extremities 11/29/2020   Muscle spasm of back 11/29/2020   Mild intermittent asthma without complication 75/64/3329   Family history of breast cancer 11/28/2020   Family history of ovarian cancer 11/28/2020   Family history of colon cancer 11/28/2020   Family history of throat cancer 11/28/2020   Family history of lung cancer 11/28/2020   Family history of leukemia 11/28/2020   Allergic rhinitis 11/17/2014   Anxiety 09/08/2014   Acute asthma exacerbation 09/08/2014   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/08/2014   Headache, migraine 09/08/2014   Excess, menstruation 09/08/2014    PCP: Tally Joe, FNP  REFERRING PROVIDER: Tally Joe, FNP  REFERRING DIAG:  M17.11 (ICD-10-CM) - Unilateral primary osteoarthritis, right knee  Z96.651 (ICD-10-CM) - Status post total right knee replacement    THERAPY DIAG:  No diagnosis found.  Rationale for Evaluation and Treatment: Rehabilitation  ONSET DATE: 03/11/22  SUBJECTIVE:   SUBJECTIVE STATEMENT:  Pt received a R TKA  on 03/11/22.  Pt notes that her knee has been doing well and the at home therapist were happy with how she was doing.  Pt states she is hoping that she could use the cane if possible.  Pt notes that she experiences the majority of her pain in the morning when she wakes up.  She typically puts her ice on when she wakes up and takes her tylenol  arthritis, or her pain meds if the pain is great enough.  She states that it typically takes her about an hour to get things moving well.    PERTINENT HISTORY:  Pt is s/p R TKA on 03/11/22.  Pt received HHPT following surgery and is not seeking outpatient services at this location.  No restrictions according to pt at this time.    PAIN:  Are you having pain? Yes: NPRS scale: 4/10 Pain location: R Lateral Portion Pain description: Burning around the numb portion/shooting pains occasionally Aggravating factors: Waking up in the AM with stiffness Relieving factors: Ice and Movement  PRECAUTIONS: None  WEIGHT BEARING RESTRICTIONS: No  FALLS: Has patient fallen in last 6 months? Yes. Number of falls 2  LIVING ENVIRONMENT: Lives with: lives with their spouse Lives in: House/apartment Stairs: Yes: External: 4 steps; can reach both Has following equipment at home: Gilford Rile - 2 wheeled  OCCUPATION: Retired - [employer]  PLOF: Needs assistance with ADLs and Needs assistance with homemaking  PATIENT GOALS: Get back to where she can ride her motorcycle ([employer])  NEXT MD VISIT: Dr. Ninfa Linden, 03/31/22  OBJECTIVE:   DIAGNOSTIC FINDINGS:   CLINICAL DATA:  Status post right knee replacement   EXAM: PORTABLE RIGHT KNEE - 1-2 VIEW   COMPARISON:  12/02/2021   FINDINGS: New right knee prosthesis is noted in satisfactory position. Old healed proximal fibular fracture is seen.   IMPRESSION: Status post right knee replacement     Electronically Signed   By: Inez Catalina M.D.   On: 03/11/2022 19:24  PATIENT SURVEYS:  LEFS 58.75% FOTO 57/63   COGNITION: Overall cognitive status: Within functional limits for tasks assessed     SENSATION: WFL; normal lack of lateral sensation upon testing.  EDEMA:  Some swelling noted on the distal medial portion of the incision site  POSTURE: No Significant postural limitations  PALPATION: No pain noted in the knee at this  time.  LOWER EXTREMITY ROM:  Active ROM Right eval Left eval  Knee flexion 110 deg 126 deg  Knee extension 8 deg lacking 2 deg hyperextension   (Blank rows = not tested)  LOWER EXTREMITY MMT:  MMT Right eval Left eval  Hip flexion 4 4+  Hip extension 4 4+  Knee flexion 4- 4+  Knee extension 4+ 5  Ankle dorsiflexion 4 5   (Blank rows = not tested)    FUNCTIONAL TESTS:  5 times sit to stand: 14.97 Timed up and go (TUG): 12.33 6 minute walk test: deferred to next visit 10 meter walk test: 11.78 sec   GAIT: Distance walked: 20 meters Assistive device utilized: Single point cane Level of assistance: CGA Comments:  Pt with unsteady gait pattern without UE support, due to knee instability.  Pt lacking adequate medial quad strength for proper IR of the R LE to have proper gait pattern.   TODAY'S TREATMENT: DATE: 04/07/22   Eval Only  PATIENT EDUCATION:   Education details: Pt educated on role of PT and services provided during current POC, along with prognosis and information about  the clinic. Person educated: Patient Education method: Explanation Education comprehension: verbalized understanding  HOME EXERCISE PROGRAM:  Access Code: [email or web address] URL: [email or web address] Date: 04/07/2022 Prepared by: [place]  Exercises - Supine Active Straight Leg Raise  - 1 x daily - 7 x weekly - 3 sets - 10 reps - Seated Long Arc Quad  - 1 x daily - 7 x weekly - 3 sets - 10 reps - Supine Heel Slide  - 1 x daily - 7 x weekly - 3 sets - 10 reps - Mini Squat with Counter Support  - 1 x daily - 7 x weekly - 3 sets - 10 reps - Supine Knee Extension Strengthening  - 1 x daily - 7 x weekly - 3 sets - 10 reps - Seated Heel Slide  - 1 x daily - 7 x weekly - 3 sets - 10 reps  ASSESSMENT:  CLINICAL IMPRESSION: Patient is a 57 y.o. female who was seen today for physical therapy evaluation and treatment for s/p R TKA.  Pt presents with physical impairments of  decreased activity tolerance, decreased ROM of R knee, increased pain in R knee, and decreased strength in B LE as noted.  Pt will benefit from skilled therapy to address tolerance, ROM, pain, and strength impairments necessary for improvement in quality of life.  Pt. demonstrates understanding of this plan of care and agrees with this plan.    OBJECTIVE IMPAIRMENTS: Abnormal gait, decreased activity tolerance, decreased balance, decreased endurance, decreased knowledge of use of DME, decreased mobility, difficulty walking, decreased ROM, decreased strength, impaired flexibility, and pain.   ACTIVITY LIMITATIONS: bending, sitting, standing, squatting, stairs, transfers, bed mobility, toileting, dressing, and locomotion level  PARTICIPATION LIMITATIONS: meal prep, cleaning, laundry, shopping, community activity, and yard work  PERSONAL FACTORS: Education, Past/current experiences, Time since onset of injury/illness/exacerbation, and 1 comorbidity: arthritis  are also affecting patient's functional outcome.   REHAB POTENTIAL: Excellent  CLINICAL DECISION MAKING: Stable/uncomplicated  EVALUATION COMPLEXITY: Low   GOALS: Goals reviewed with patient? Yes  SHORT TERM GOALS: Target date: 05/05/2022  Pt will be independent with HEP in order to demonstrate increased ability to perform tasks related to occupation/hobbies. Baseline: Goal status: INITIAL    LONG TERM GOALS: Target date: 06/30/2022  1.  Patient (> 25 years old) will complete five times sit to stand test in <10 seconds indicating an increased LE strength and improved balance. Baseline: 14.97 sec Goal status: INITIAL  2.  Patient will increase FOTO score to equal to or greater than 11.78 sec to demonstrate statistically significant improvement in mobility and quality of life.  Baseline: 57/63 Goal status: INITIAL   3.  Patient will improve LEFS score by >9% to demonstrate increased functional ability to perform daily  tasks. Baseline: 58.75% Goal status: INITIAL   4.  Patient will reduce timed up and go to <10 seconds to reduce fall risk and demonstrate improved transfer/gait ability. Baseline: 12.33 Goal status: INITIAL  5.  Patient will increase 10 meter walk test to >1.71ms as to improve gait speed for better community ambulation and to reduce fall risk. Baseline: 11.78 sec; 0.85 m/s Goal status: INITIAL  6.  Patient will increase six minute walk test distance to >1000 for progression to community ambulator and improve gait ability Baseline: Not performed at this time. Goal status: INITIAL     PLAN:  PT FREQUENCY: 1-2x/week  PT DURATION: 12 weeks  PLANNED INTERVENTIONS: Therapeutic exercises, Therapeutic activity, Neuromuscular re-education, Balance training, Gait training, Patient/Family education, Self  Care, Joint mobilization, Joint manipulation, Stair training, Vestibular training, Canalith repositioning, Spinal manipulation, Spinal mobilization, Cryotherapy, Moist heat, scar mobilization, and Manual therapy  PLAN FOR NEXT SESSION:  Perform 6MWT and assess compliance with HEP.     Gwenlyn Saran, PT, DPT Physical Therapist- [facility]  04/07/22, 5:22 PM

## 2022-04-10 ENCOUNTER — Ambulatory Visit: Payer: Medicare Other

## 2022-04-10 ENCOUNTER — Other Ambulatory Visit: Payer: Self-pay | Admitting: Orthopaedic Surgery

## 2022-04-10 DIAGNOSIS — R269 Unspecified abnormalities of gait and mobility: Secondary | ICD-10-CM | POA: Diagnosis not present

## 2022-04-10 DIAGNOSIS — R262 Difficulty in walking, not elsewhere classified: Secondary | ICD-10-CM

## 2022-04-10 DIAGNOSIS — M25561 Pain in right knee: Secondary | ICD-10-CM | POA: Diagnosis not present

## 2022-04-10 DIAGNOSIS — M1711 Unilateral primary osteoarthritis, right knee: Secondary | ICD-10-CM | POA: Diagnosis not present

## 2022-04-10 DIAGNOSIS — M6281 Muscle weakness (generalized): Secondary | ICD-10-CM

## 2022-04-10 DIAGNOSIS — M5459 Other low back pain: Secondary | ICD-10-CM | POA: Diagnosis not present

## 2022-04-10 DIAGNOSIS — M25661 Stiffness of right knee, not elsewhere classified: Secondary | ICD-10-CM | POA: Diagnosis not present

## 2022-04-10 DIAGNOSIS — Z96651 Presence of right artificial knee joint: Secondary | ICD-10-CM | POA: Diagnosis not present

## 2022-04-10 DIAGNOSIS — R29898 Other symptoms and signs involving the musculoskeletal system: Secondary | ICD-10-CM | POA: Diagnosis not present

## 2022-04-10 DIAGNOSIS — M542 Cervicalgia: Secondary | ICD-10-CM | POA: Diagnosis not present

## 2022-04-10 MED ORDER — OXYCODONE HCL 5 MG PO TABS: 5.0000 mg | ORAL_TABLET | ORAL | 0 refills | Status: DC | PRN

## 2022-04-10 NOTE — Therapy (Signed)
OUTPATIENT PHYSICAL THERAPY LOWER EXTREMITY EVALUATION   Patient Name: Megan Jimenez MRN: [id number] DOB:[birth year], 57 y.o., female Today's Date: 04/10/2022  END OF SESSION:  PT End of Session - 04/10/22 1439     Visit Number 2    Number of Visits 24    Date for PT Re-Evaluation 06/30/22    PT Start Time 9326    PT Stop Time 1515    PT Time Calculation (min) 38 min    Equipment Utilized During Treatment Gait belt    Activity Tolerance Patient tolerated treatment well    Behavior During Therapy WFL for tasks assessed/performed             Past Medical History:  Diagnosis Date   Anxiety    Arthritis    Asthma    Bipolar 1 disorder ([place])    COVID-19 03/29/2021   Family history of breast cancer    Family history of colon cancer    Family history of leukemia    Family history of lung cancer    Family history of ovarian cancer    Family history of throat cancer    Pre-diabetes    Past Surgical History:  Procedure Laterality Date   ANKLE FRACTURE SURGERY Right 1992   ANTERIOR CERVICAL DECOMP/DISCECTOMY FUSION N/A 04/23/2021   Procedure: ANTERIOR CERVICAL DISCECTOMIES AND FUSION C4-5, C5-6 AND C6-7 WITH PLATES, SCREWS, ALLOGRAFT ACDF GRAFT, LOCAL BONE GRAFT, VIVIGEN;  Surgeon: Jessy Oto, MD;  Location: [place];  Service: Orthopedics;  Laterality: N/A;   CARPAL TUNNEL RELEASE Left 04/23/2021   Procedure: LEFT OPEN CARPAL TUNNEL RELEASE;  Surgeon: Jessy Oto, MD;  Location: [facility];  Service: Orthopedics;  Laterality: Left;   CHOLECYSTECTOMY     COLONOSCOPY WITH PROPOFOL N/A 07/09/2020   Procedure: COLONOSCOPY WITH PROPOFOL;  Surgeon: Jonathon Bellows, MD;  Location: [facility] ENDOSCOPY;  Service: Gastroenterology;  Laterality: N/A;   TOTAL KNEE ARTHROPLASTY Right 03/11/2022   Procedure: RIGHT TOTAL KNEE ARTHROPLASTY;  Surgeon: Mcarthur Rossetti, MD;  Location: [place];  Service: Orthopedics;  Laterality: Right;  Needs RNFA   Patient Active Problem List   Diagnosis Date  Noted   Status post total right knee replacement 03/11/2022   Status post total knee replacement 03/11/2022   Cervical radiculopathy 01/22/2022   Unilateral primary osteoarthritis, right knee 12/25/2021   Prediabetes 11/01/2021   OAB (overactive bladder) 08/22/2021   Vaginal atrophy 08/22/2021   Primary hypertension 08/22/2021   Hyperglycemia 07/24/2021   Morbid obesity ([place]) 06/14/2021   Asthmatic bronchitis , chronic 05/16/2021   Herniated nucleus pulposus, cervical 04/23/2021    Class: Chronic   Spinal stenosis of cervical region 04/23/2021    Class: Chronic   S/P cervical spinal fusion 04/23/2021   Carpal tunnel syndrome, left upper limb 02/28/2021   Genetic testing 12/12/2020   Other spondylosis with radiculopathy, cervical region 11/29/2020   Osteoarthritis of thoracic spine 11/29/2020   Abnormal gait 11/29/2020   Paresthesia 11/29/2020   Need for shingles vaccine 11/29/2020   Need for hepatitis C screening test 11/29/2020   Weakness of both lower extremities 11/29/2020   Muscle spasm of back 11/29/2020   Mild intermittent asthma without complication 71/24/5809   Family history of breast cancer 11/28/2020   Family history of ovarian cancer 11/28/2020   Family history of colon cancer 11/28/2020   Family history of throat cancer 11/28/2020   Family history of lung cancer 11/28/2020   Family history of leukemia 11/28/2020   Allergic rhinitis 11/17/2014  Anxiety 09/08/2014   Acute asthma exacerbation 09/08/2014   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/08/2014   Headache, migraine 09/08/2014   Excess, menstruation 09/08/2014    PCP: Tally Joe, FNP  REFERRING PROVIDER: Tally Joe, FNP  REFERRING DIAG:  M17.11 (ICD-10-CM) - Unilateral primary osteoarthritis, right knee  Z96.651 (ICD-10-CM) - Status post total right knee replacement    THERAPY DIAG:  Decreased range of motion (ROM) of right knee  Acute pain of right knee  Right leg weakness  Muscle weakness  (generalized)  Gait abnormality  Difficulty in walking, not elsewhere classified  Rationale for Evaluation and Treatment: Rehabilitation  ONSET DATE: 03/11/22  SUBJECTIVE:   SUBJECTIVE STATEMENT:  Pt reports no new complaints upon arrival.  Pt was able to get a new cane at [facility]   Pt has been applying silicone gel on her incision the past 2 days.    PERTINENT HISTORY:  Pt is s/p R TKA on 03/11/22.  Pt received HHPT following surgery and is not seeking outpatient services at this location.  No restrictions according to pt at this time.    PAIN:  Are you having pain? Yes: NPRS scale: 4/10 Pain location: R Lateral Portion Pain description: Burning around the numb portion/shooting pains occasionally Aggravating factors: Waking up in the AM with stiffness Relieving factors: Ice and Movement  PRECAUTIONS: None  WEIGHT BEARING RESTRICTIONS: No  FALLS: Has patient fallen in last 6 months? Yes. Number of falls 2  LIVING ENVIRONMENT: Lives with: lives with their spouse Lives in: House/apartment Stairs: Yes: External: 4 steps; can reach both Has following equipment at home: Gilford Rile - 2 wheeled  OCCUPATION: Retired - [employer]  PLOF: Needs assistance with ADLs and Needs assistance with homemaking  PATIENT GOALS: Get back to where she can ride her motorcycle ([employer])  NEXT MD VISIT: Dr. Ninfa Linden, 03/31/22  OBJECTIVE:   DIAGNOSTIC FINDINGS:   CLINICAL DATA:  Status post right knee replacement   EXAM: PORTABLE RIGHT KNEE - 1-2 VIEW   COMPARISON:  12/02/2021   FINDINGS: New right knee prosthesis is noted in satisfactory position. Old healed proximal fibular fracture is seen.   IMPRESSION: Status post right knee replacement     Electronically Signed   By: Inez Catalina M.D.   On: 03/11/2022 19:24  PATIENT SURVEYS:  LEFS 58.75% FOTO 57/63   COGNITION: Overall cognitive status: Within functional limits for tasks assessed     SENSATION: WFL;  normal lack of lateral sensation upon testing.  EDEMA:  Some swelling noted on the distal medial portion of the incision site  POSTURE: No Significant postural limitations  PALPATION: No pain noted in the knee at this time.  LOWER EXTREMITY ROM:  Active ROM Right eval Left eval  Knee flexion 110 deg 126 deg  Knee extension 8 deg lacking 2 deg hyperextension   (Blank rows = not tested)  LOWER EXTREMITY MMT:  MMT Right eval Left eval  Hip flexion 4 4+  Hip extension 4 4+  Knee flexion 4- 4+  Knee extension 4+ 5  Ankle dorsiflexion 4 5   (Blank rows = not tested)    FUNCTIONAL TESTS:  5 times sit to stand: 14.97 Timed up and go (TUG): 12.33 6 minute walk test: deferred to next visit 10 meter walk test: 11.78 sec   GAIT: Distance walked: 20 meters Assistive device utilized: Single point cane Level of assistance: CGA Comments:  Pt with unsteady gait pattern without UE support, due to knee instability.  Pt lacking adequate medial  quad strength for proper IR of the R LE to have proper gait pattern.   TODAY'S TREATMENT: DATE: 04/10/22   TherEx:  Seated knee flexion slides with use of washcloth for easier glide and sitting on airex pad for improved technique, 2x15 Supine quad set with towel under ankle, 2x10 with 3 sec holds Supine SLR with quad set prior to lift off to prevent extension lag, 2x10 with slowed eccentric lowering  6MWT performed and noted below:    Manual:  STM to anterior tibialis and distal quads for reduction of tissue restriction Patellar mobilization in all planes of mobility for improved ROM of the knee and pain reduction in the R knee Supine low load long duration knee extension for improved terminal knee extension, x3 minutes Supine desensitization with use of wash cloth for decreased hypersensitivity Cross friction massage applied to the incision site to prevent tissue adhesion    PATIENT EDUCATION:   Education details: Pt educated  on role of PT and services provided during current POC, along with prognosis and information about the clinic. Person educated: Patient Education method: Explanation Education comprehension: verbalized understanding  HOME EXERCISE PROGRAM:  Access Code: [email or web address] URL: [email or web address] Date: 04/07/2022 Prepared by: [place] [employer]  Exercises - Supine Active Straight Leg Raise  - 1 x daily - 7 x weekly - 3 sets - 10 reps - Seated Long Arc Quad  - 1 x daily - 7 x weekly - 3 sets - 10 reps - Supine Heel Slide  - 1 x daily - 7 x weekly - 3 sets - 10 reps - Mini Squat with Counter Support  - 1 x daily - 7 x weekly - 3 sets - 10 reps - Supine Knee Extension Strengthening  - 1 x daily - 7 x weekly - 3 sets - 10 reps - Seated Heel Slide  - 1 x daily - 7 x weekly - 3 sets - 10 reps  ASSESSMENT:  CLINICAL IMPRESSION:  Pt performed well and put forth great effort throughout the session.  Pt demonstrated improved gait mechanics with the use of the cane and has been compliant with HEP as demonstrated by ability to perform the exercises given.  Pt  encouraged to continue to perform in order to prevent extension lag of the R LE when performing SLR.  Pt also given desensitization exercise to perform at home in order to relieve the hypersensitive area distal to the incision site.   Pt will continue to benefit from skilled therapy to address remaining deficits in order to improve overall QoL and return to PLOF.       OBJECTIVE IMPAIRMENTS: Abnormal gait, decreased activity tolerance, decreased balance, decreased endurance, decreased knowledge of use of DME, decreased mobility, difficulty walking, decreased ROM, decreased strength, impaired flexibility, and pain.   ACTIVITY LIMITATIONS: bending, sitting, standing, squatting, stairs, transfers, bed mobility, toileting, dressing, and locomotion level  PARTICIPATION LIMITATIONS: meal prep, cleaning, laundry, shopping, community activity, and  yard work  PERSONAL FACTORS: Education, Past/current experiences, Time since onset of injury/illness/exacerbation, and 1 comorbidity: arthritis  are also affecting patient's functional outcome.   REHAB POTENTIAL: Excellent  CLINICAL DECISION MAKING: Stable/uncomplicated  EVALUATION COMPLEXITY: Low   GOALS: Goals reviewed with patient? Yes  SHORT TERM GOALS: Target date: 05/05/2022  Pt will be independent with HEP in order to demonstrate increased ability to perform tasks related to occupation/hobbies. Baseline: Goal status: INITIAL    LONG TERM GOALS: Target date: 06/30/2022  1.  Patient (> 94 years old) will  complete five times sit to stand test in <10 seconds indicating an increased LE strength and improved balance. Baseline: 14.97 sec Goal status: INITIAL  2.  Patient will increase FOTO score to equal to or greater than 11.78 sec to demonstrate statistically significant improvement in mobility and quality of life.  Baseline: 57/63 Goal status: INITIAL   3.  Patient will improve LEFS score by >9% to demonstrate increased functional ability to perform daily tasks. Baseline: 58.75% Goal status: INITIAL   4.  Patient will reduce timed up and go to <10 seconds to reduce fall risk and demonstrate improved transfer/gait ability. Baseline: 12.33 Goal status: INITIAL  5.  Patient will increase 10 meter walk test to >1.50ms as to improve gait speed for better community ambulation and to reduce fall risk. Baseline: 11.78 sec; 0.85 m/s Goal status: INITIAL  6.  Patient will increase six minute walk test distance to >1000 for progression to community ambulator and improve gait ability Baseline: Not performed at this time. Goal status: INITIAL     PLAN:  PT FREQUENCY: 1-2x/week  PT DURATION: 12 weeks  PLANNED INTERVENTIONS: Therapeutic exercises, Therapeutic activity, Neuromuscular re-education, Balance training, Gait training, Patient/Family education, Self Care, Joint  mobilization, Joint manipulation, Stair training, Vestibular training, Canalith repositioning, Spinal manipulation, Spinal mobilization, Cryotherapy, Moist heat, scar mobilization, and Manual therapy  PLAN FOR NEXT SESSION:    Perform 6MWT and assess compliance with HEP.     JGwenlyn Saran PT, DPT Physical Therapist- [facility] 04/10/22, 5:26 PM

## 2022-04-15 ENCOUNTER — Ambulatory Visit: Payer: Medicare Other

## 2022-04-15 DIAGNOSIS — R269 Unspecified abnormalities of gait and mobility: Secondary | ICD-10-CM

## 2022-04-15 DIAGNOSIS — M25561 Pain in right knee: Secondary | ICD-10-CM | POA: Diagnosis not present

## 2022-04-15 DIAGNOSIS — M6281 Muscle weakness (generalized): Secondary | ICD-10-CM

## 2022-04-15 DIAGNOSIS — M5459 Other low back pain: Secondary | ICD-10-CM | POA: Diagnosis not present

## 2022-04-15 DIAGNOSIS — M25661 Stiffness of right knee, not elsewhere classified: Secondary | ICD-10-CM

## 2022-04-15 DIAGNOSIS — M542 Cervicalgia: Secondary | ICD-10-CM | POA: Diagnosis not present

## 2022-04-15 DIAGNOSIS — M1711 Unilateral primary osteoarthritis, right knee: Secondary | ICD-10-CM | POA: Diagnosis not present

## 2022-04-15 DIAGNOSIS — R29898 Other symptoms and signs involving the musculoskeletal system: Secondary | ICD-10-CM

## 2022-04-15 DIAGNOSIS — Z96651 Presence of right artificial knee joint: Secondary | ICD-10-CM | POA: Diagnosis not present

## 2022-04-15 DIAGNOSIS — R262 Difficulty in walking, not elsewhere classified: Secondary | ICD-10-CM

## 2022-04-15 NOTE — Therapy (Signed)
OUTPATIENT PHYSICAL THERAPY LOWER EXTREMITY TREATMENT   Patient Name: Megan Jimenez MRN: [id number] DOB:19-Aug-1965, 57 y.o., female Today's Date: 04/15/2022  END OF SESSION:  PT End of Session - 04/15/22 1359     Visit Number 3    Number of Visits 24    Date for PT Re-Evaluation 06/30/22    PT Start Time 0626    PT Stop Time 1430    PT Time Calculation (min) 32 min    Equipment Utilized During Treatment Gait belt    Activity Tolerance Patient tolerated treatment well    Behavior During Therapy WFL for tasks assessed/performed              Past Medical History:  Diagnosis Date   Anxiety    Arthritis    Asthma    Bipolar 1 disorder ([place])    COVID-19 03/29/2021   Family history of breast cancer    Family history of colon cancer    Family history of leukemia    Family history of lung cancer    Family history of ovarian cancer    Family history of throat cancer    Pre-diabetes    Past Surgical History:  Procedure Laterality Date   ANKLE FRACTURE SURGERY Right 1992   ANTERIOR CERVICAL DECOMP/DISCECTOMY FUSION N/A 04/23/2021   Procedure: ANTERIOR CERVICAL DISCECTOMIES AND FUSION C4-5, C5-6 AND C6-7 WITH PLATES, SCREWS, ALLOGRAFT ACDF GRAFT, LOCAL BONE GRAFT, VIVIGEN;  Surgeon: Jessy Oto, MD;  Location: [place];  Service: Orthopedics;  Laterality: N/A;   CARPAL TUNNEL RELEASE Left 04/23/2021   Procedure: LEFT OPEN CARPAL TUNNEL RELEASE;  Surgeon: Jessy Oto, MD;  Location: [place];  Service: Orthopedics;  Laterality: Left;   CHOLECYSTECTOMY     COLONOSCOPY WITH PROPOFOL N/A 07/09/2020   Procedure: COLONOSCOPY WITH PROPOFOL;  Surgeon: Jonathon Bellows, MD;  Location: [facility] ENDOSCOPY;  Service: Gastroenterology;  Laterality: N/A;   TOTAL KNEE ARTHROPLASTY Right 03/11/2022   Procedure: RIGHT TOTAL KNEE ARTHROPLASTY;  Surgeon: Mcarthur Rossetti, MD;  Location: [place];  Service: Orthopedics;  Laterality: Right;  Needs RNFA   Patient Active Problem List   Diagnosis Date  Noted   Status post total right knee replacement 03/11/2022   Status post total knee replacement 03/11/2022   Cervical radiculopathy 01/22/2022   Unilateral primary osteoarthritis, right knee 12/25/2021   Prediabetes 11/01/2021   OAB (overactive bladder) 08/22/2021   Vaginal atrophy 08/22/2021   Primary hypertension 08/22/2021   Hyperglycemia 07/24/2021   Morbid obesity ([place]) 06/14/2021   Asthmatic bronchitis , chronic 05/16/2021   Herniated nucleus pulposus, cervical 04/23/2021    Class: Chronic   Spinal stenosis of cervical region 04/23/2021    Class: Chronic   S/P cervical spinal fusion 04/23/2021   Carpal tunnel syndrome, left upper limb 02/28/2021   Genetic testing 12/12/2020   Other spondylosis with radiculopathy, cervical region 11/29/2020   Osteoarthritis of thoracic spine 11/29/2020   Abnormal gait 11/29/2020   Paresthesia 11/29/2020   Need for shingles vaccine 11/29/2020   Need for hepatitis C screening test 11/29/2020   Weakness of both lower extremities 11/29/2020   Muscle spasm of back 11/29/2020   Mild intermittent asthma without complication 94/85/4627   Family history of breast cancer 11/28/2020   Family history of ovarian cancer 11/28/2020   Family history of colon cancer 11/28/2020   Family history of throat cancer 11/28/2020   Family history of lung cancer 11/28/2020   Family history of leukemia 11/28/2020   Allergic rhinitis 11/17/2014  Anxiety 09/08/2014   Acute asthma exacerbation 09/08/2014   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/08/2014   Headache, migraine 09/08/2014   Excess, menstruation 09/08/2014    PCP: Tally Joe, FNP  REFERRING PROVIDER: Tally Joe, FNP  REFERRING DIAG:  M17.11 (ICD-10-CM) - Unilateral primary osteoarthritis, right knee  Z96.651 (ICD-10-CM) - Status post total right knee replacement    THERAPY DIAG:  Decreased range of motion (ROM) of right knee  Acute pain of right knee  Right leg weakness  Muscle weakness  (generalized)  Gait abnormality  Difficulty in walking, not elsewhere classified  Rationale for Evaluation and Treatment: Rehabilitation  ONSET DATE: 03/11/22  SUBJECTIVE:   SUBJECTIVE STATEMENT:  Pt running late to the clinic, but otherwise doing well.  Pt notes her exercises are going well and she has been consistent with those.  PERTINENT HISTORY:  Pt is s/p R TKA on 03/11/22.  Pt received HHPT following surgery and is not seeking outpatient services at this location.  No restrictions according to pt at this time.    PAIN:  Are you having pain? Yes: NPRS scale: 3/10 Pain location: R Lateral Portion Pain description: Burning around the numb portion/shooting pains occasionally Aggravating factors: Waking up in the AM with stiffness Relieving factors: Ice and Movement  PRECAUTIONS: None  WEIGHT BEARING RESTRICTIONS: No  FALLS: Has patient fallen in last 6 months? Yes. Number of falls 2  LIVING ENVIRONMENT: Lives with: lives with their spouse Lives in: House/apartment Stairs: Yes: External: 4 steps; can reach both Has following equipment at home: Gilford Rile - 2 wheeled  OCCUPATION: Retired - [employer]  PLOF: Needs assistance with ADLs and Needs assistance with homemaking  PATIENT GOALS: Get back to where she can ride her motorcycle ([employer])  NEXT MD VISIT: Dr. Ninfa Linden, 03/31/22  OBJECTIVE:   DIAGNOSTIC FINDINGS:   CLINICAL DATA:  Status post right knee replacement   EXAM: PORTABLE RIGHT KNEE - 1-2 VIEW   COMPARISON:  12/02/2021   FINDINGS: New right knee prosthesis is noted in satisfactory position. Old healed proximal fibular fracture is seen.   IMPRESSION: Status post right knee replacement     Electronically Signed   By: Inez Catalina M.D.   On: 03/11/2022 19:24  PATIENT SURVEYS:  LEFS 58.75% FOTO 57/63   COGNITION: Overall cognitive status: Within functional limits for tasks assessed     SENSATION: WFL; normal lack of lateral  sensation upon testing.  EDEMA:  Some swelling noted on the distal medial portion of the incision site  POSTURE: No Significant postural limitations  PALPATION: No pain noted in the knee at this time.  LOWER EXTREMITY ROM:  Active ROM Right eval Left eval  Knee flexion 110 deg 126 deg  Knee extension 8 deg lacking 2 deg hyperextension   (Blank rows = not tested)  LOWER EXTREMITY MMT:  MMT Right eval Left eval  Hip flexion 4 4+  Hip extension 4 4+  Knee flexion 4- 4+  Knee extension 4+ 5  Ankle dorsiflexion 4 5   (Blank rows = not tested)    FUNCTIONAL TESTS:  5 times sit to stand: 14.97 Timed up and go (TUG): 12.33 6 minute walk test: deferred to next visit 10 meter walk test: 11.78 sec   GAIT: Distance walked: 20 meters Assistive device utilized: Single point cane Level of assistance: CGA Comments:  Pt with unsteady gait pattern without UE support, due to knee instability.  Pt lacking adequate medial quad strength for proper IR of the R LE to  have proper gait pattern.   TODAY'S TREATMENT: DATE: 04/15/22    TherEx:  Seated hamstring curls with BTB, 2x15 each LE Standing terminal knee extension into rainbow physioball, 2x15 Supine SLR with quad set prior to lift off to prevent extension lag, 2x10 with slowed eccentric lowering Prone quad stretch, 30 sec bouts x4 with overpressure from therapist Lunges onto 2nd step of stairs, with UE focusing on increased knee flexion, 2x15 with 3 sec holds   Manual:  STM to anterior tibialis and distal quads for reduction of tissue restriction Patellar mobilization in all planes of mobility for improved ROM of the knee and pain reduction in the R knee Supine low load long duration knee extension for improved terminal knee extension, x3 minutes Cross friction massage applied to the incision site to prevent tissue adhesion    PATIENT EDUCATION:   Education details: Pt educated on role of PT and services provided  during current POC, along with prognosis and information about the clinic. Person educated: Patient Education method: Explanation Education comprehension: verbalized understanding  HOME EXERCISE PROGRAM:  Access Code: [email or web address] URL: [email or web address] Date: 04/07/2022 Prepared by: [place] [employer]  Exercises - Supine Active Straight Leg Raise  - 1 x daily - 7 x weekly - 3 sets - 10 reps - Seated Long Arc Quad  - 1 x daily - 7 x weekly - 3 sets - 10 reps - Supine Heel Slide  - 1 x daily - 7 x weekly - 3 sets - 10 reps - Mini Squat with Counter Support  - 1 x daily - 7 x weekly - 3 sets - 10 reps - Supine Knee Extension Strengthening  - 1 x daily - 7 x weekly - 3 sets - 10 reps - Seated Heel Slide  - 1 x daily - 7 x weekly - 3 sets - 10 reps  ASSESSMENT:  CLINICAL IMPRESSION:  Pt session limited due to pt arriving late to the clinic.  Pt tolerated all exercises well and has been making steady improvements with ROM.  Pt only lacking 3 deg of extension during session today.  Pt also able to demonstrate improved SLR by not having any extension lag while performing the exercise.  Pt advised to continue to perform the HEP and was given prone quad stretch as additional exercise going forward.   Pt will continue to benefit from skilled therapy to address remaining deficits in order to improve overall QoL and return to PLOF.        OBJECTIVE IMPAIRMENTS: Abnormal gait, decreased activity tolerance, decreased balance, decreased endurance, decreased knowledge of use of DME, decreased mobility, difficulty walking, decreased ROM, decreased strength, impaired flexibility, and pain.   ACTIVITY LIMITATIONS: bending, sitting, standing, squatting, stairs, transfers, bed mobility, toileting, dressing, and locomotion level  PARTICIPATION LIMITATIONS: meal prep, cleaning, laundry, shopping, community activity, and yard work  PERSONAL FACTORS: Education, Past/current experiences, Time since  onset of injury/illness/exacerbation, and 1 comorbidity: arthritis  are also affecting patient's functional outcome.   REHAB POTENTIAL: Excellent  CLINICAL DECISION MAKING: Stable/uncomplicated  EVALUATION COMPLEXITY: Low   GOALS: Goals reviewed with patient? Yes  SHORT TERM GOALS: Target date: 05/05/2022  Pt will be independent with HEP in order to demonstrate increased ability to perform tasks related to occupation/hobbies. Baseline: Goal status: INITIAL    LONG TERM GOALS: Target date: 06/30/2022  1.  Patient (> 1 years old) will complete five times sit to stand test in <10 seconds indicating an increased LE strength and improved balance. Baseline:  14.97 sec Goal status: INITIAL  2.  Patient will increase FOTO score to equal to or greater than 11.78 sec to demonstrate statistically significant improvement in mobility and quality of life.  Baseline: 57/63 Goal status: INITIAL   3.  Patient will improve LEFS score by >9% to demonstrate increased functional ability to perform daily tasks. Baseline: 58.75% Goal status: INITIAL   4.  Patient will reduce timed up and go to <10 seconds to reduce fall risk and demonstrate improved transfer/gait ability. Baseline: 12.33 Goal status: INITIAL  5.  Patient will increase 10 meter walk test to >1.64ms as to improve gait speed for better community ambulation and to reduce fall risk. Baseline: 11.78 sec; 0.85 m/s Goal status: INITIAL  6.  Patient will increase six minute walk test distance to >1000 for progression to community ambulator and improve gait ability Baseline: Not performed at this time. Goal status: INITIAL     PLAN:  PT FREQUENCY: 1-2x/week  PT DURATION: 12 weeks  PLANNED INTERVENTIONS: Therapeutic exercises, Therapeutic activity, Neuromuscular re-education, Balance training, Gait training, Patient/Family education, Self Care, Joint mobilization, Joint manipulation, Stair training, Vestibular training, Canalith  repositioning, Spinal manipulation, Spinal mobilization, Cryotherapy, Moist heat, scar mobilization, and Manual therapy  PLAN FOR NEXT SESSION:   Continue with ROM and strengthening exercises.    JGwenlyn Saran PT, DPT Physical Therapist- [facility] 04/15/22, 5:23 PM

## 2022-04-16 ENCOUNTER — Other Ambulatory Visit: Payer: Self-pay | Admitting: Orthopaedic Surgery

## 2022-04-16 MED ORDER — OXYCODONE HCL 5 MG PO TABS: 5.0000 mg | ORAL_TABLET | ORAL | 0 refills | Status: DC | PRN

## 2022-04-17 ENCOUNTER — Ambulatory Visit: Payer: Medicare Other | Attending: Orthopaedic Surgery

## 2022-04-17 DIAGNOSIS — M6281 Muscle weakness (generalized): Secondary | ICD-10-CM | POA: Diagnosis not present

## 2022-04-17 DIAGNOSIS — M25561 Pain in right knee: Secondary | ICD-10-CM | POA: Diagnosis not present

## 2022-04-17 DIAGNOSIS — M542 Cervicalgia: Secondary | ICD-10-CM | POA: Insufficient documentation

## 2022-04-17 DIAGNOSIS — M5459 Other low back pain: Secondary | ICD-10-CM | POA: Diagnosis not present

## 2022-04-17 DIAGNOSIS — R262 Difficulty in walking, not elsewhere classified: Secondary | ICD-10-CM | POA: Diagnosis not present

## 2022-04-17 DIAGNOSIS — R29898 Other symptoms and signs involving the musculoskeletal system: Secondary | ICD-10-CM | POA: Insufficient documentation

## 2022-04-17 DIAGNOSIS — M25661 Stiffness of right knee, not elsewhere classified: Secondary | ICD-10-CM | POA: Insufficient documentation

## 2022-04-17 DIAGNOSIS — R269 Unspecified abnormalities of gait and mobility: Secondary | ICD-10-CM | POA: Insufficient documentation

## 2022-04-17 NOTE — Therapy (Signed)
OUTPATIENT PHYSICAL THERAPY LOWER EXTREMITY TREATMENT   Patient Name: Megan Jimenez MRN: [id number] DOB:[birth year], 57 y.o., female Today's Date: 04/17/2022  END OF SESSION:     Past Medical History:  Diagnosis Date   Anxiety    Arthritis    Asthma    Bipolar 1 disorder ([place])    COVID-19 03/29/2021   Family history of breast cancer    Family history of colon cancer    Family history of leukemia    Family history of lung cancer    Family history of ovarian cancer    Family history of throat cancer    Pre-diabetes    Past Surgical History:  Procedure Laterality Date   ANKLE FRACTURE SURGERY Right 1992   ANTERIOR CERVICAL DECOMP/DISCECTOMY FUSION N/A 04/23/2021   Procedure: ANTERIOR CERVICAL DISCECTOMIES AND FUSION C4-5, C5-6 AND C6-7 WITH PLATES, SCREWS, ALLOGRAFT ACDF GRAFT, LOCAL BONE GRAFT, VIVIGEN;  Surgeon: Jessy Oto, MD;  Location: [facility];  Service: Orthopedics;  Laterality: N/A;   CARPAL TUNNEL RELEASE Left 04/23/2021   Procedure: LEFT OPEN CARPAL TUNNEL RELEASE;  Surgeon: Jessy Oto, MD;  Location: [facility];  Service: Orthopedics;  Laterality: Left;   CHOLECYSTECTOMY     COLONOSCOPY WITH PROPOFOL N/A 07/09/2020   Procedure: COLONOSCOPY WITH PROPOFOL;  Surgeon: Jonathon Bellows, MD;  Location: [facility];  Service: Gastroenterology;  Laterality: N/A;   TOTAL KNEE ARTHROPLASTY Right 03/11/2022   Procedure: RIGHT TOTAL KNEE ARTHROPLASTY;  Surgeon: Mcarthur Rossetti, MD;  Location: [facility];  Service: Orthopedics;  Laterality: Right;  Needs RNFA   Patient Active Problem List   Diagnosis Date Noted   Status post total right knee replacement 03/11/2022   Status post total knee replacement 03/11/2022   Cervical radiculopathy 01/22/2022   Unilateral primary osteoarthritis, right knee 12/25/2021   Prediabetes 11/01/2021   OAB (overactive bladder) 08/22/2021   Vaginal atrophy 08/22/2021   Primary hypertension 08/22/2021   Hyperglycemia 07/24/2021   Morbid obesity  ([place]) 06/14/2021   Asthmatic bronchitis , chronic 05/16/2021   Herniated nucleus pulposus, cervical 04/23/2021    Class: Chronic   Spinal stenosis of cervical region 04/23/2021    Class: Chronic   S/P cervical spinal fusion 04/23/2021   Carpal tunnel syndrome, left upper limb 02/28/2021   Genetic testing 12/12/2020   Other spondylosis with radiculopathy, cervical region 11/29/2020   Osteoarthritis of thoracic spine 11/29/2020   Abnormal gait 11/29/2020   Paresthesia 11/29/2020   Need for shingles vaccine 11/29/2020   Need for hepatitis C screening test 11/29/2020   Weakness of both lower extremities 11/29/2020   Muscle spasm of back 11/29/2020   Mild intermittent asthma without complication 81/19/1478   Family history of breast cancer 11/28/2020   Family history of ovarian cancer 11/28/2020   Family history of colon cancer 11/28/2020   Family history of throat cancer 11/28/2020   Family history of lung cancer 11/28/2020   Family history of leukemia 11/28/2020   Allergic rhinitis 11/17/2014   Anxiety 09/08/2014   Acute asthma exacerbation 09/08/2014   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/08/2014   Headache, migraine 09/08/2014   Excess, menstruation 09/08/2014    PCP: Tally Joe, FNP  REFERRING PROVIDER: Tally Joe, FNP  REFERRING DIAG:  M17.11 (ICD-10-CM) - Unilateral primary osteoarthritis, right knee  Z96.651 (ICD-10-CM) - Status post total right knee replacement    THERAPY DIAG:  No diagnosis found.  Rationale for Evaluation and Treatment: Rehabilitation  ONSET DATE: 03/11/22  SUBJECTIVE:   SUBJECTIVE STATEMENT:  Pt again running  late to the clinic but reports pain is decrease today than compared to yesterday.     PERTINENT HISTORY:  Pt is s/p R TKA on 03/11/22.  Pt received HHPT following surgery and is not seeking outpatient services at this location.  No restrictions according to pt at this time.    PAIN:  Are you having pain? Yes: NPRS scale: 2-3/10 Pain  location: R Lateral Portion Pain description: Burning around the numb portion/shooting pains occasionally Aggravating factors: Waking up in the AM with stiffness Relieving factors: Ice and Movement  PRECAUTIONS: None  WEIGHT BEARING RESTRICTIONS: No  FALLS: Has patient fallen in last 6 months? Yes. Number of falls 2  LIVING ENVIRONMENT: Lives with: lives with their spouse Lives in: House/apartment Stairs: Yes: External: 4 steps; can reach both Has following equipment at home: Gilford Rile - 2 wheeled  OCCUPATION: Retired - [employer]  PLOF: Needs assistance with ADLs and Needs assistance with homemaking  PATIENT GOALS: Get back to where she can ride her motorcycle ([employer])  NEXT MD VISIT: Dr. Ninfa Linden, 03/31/22  OBJECTIVE:   DIAGNOSTIC FINDINGS:   CLINICAL DATA:  Status post right knee replacement   EXAM: PORTABLE RIGHT KNEE - 1-2 VIEW   COMPARISON:  12/02/2021   FINDINGS: New right knee prosthesis is noted in satisfactory position. Old healed proximal fibular fracture is seen.   IMPRESSION: Status post right knee replacement     Electronically Signed   By: Inez Catalina M.D.   On: 03/11/2022 19:24  PATIENT SURVEYS:  LEFS 58.75% FOTO 57/63   COGNITION: Overall cognitive status: Within functional limits for tasks assessed     SENSATION: WFL; normal lack of lateral sensation upon testing.  EDEMA:  Some swelling noted on the distal medial portion of the incision site  POSTURE: No Significant postural limitations  PALPATION: No pain noted in the knee at this time.  LOWER EXTREMITY ROM:  Active ROM Right eval Left eval  Knee flexion 110 deg 126 deg  Knee extension 8 deg lacking 2 deg hyperextension   (Blank rows = not tested)  LOWER EXTREMITY MMT:  MMT Right eval Left eval  Hip flexion 4 4+  Hip extension 4 4+  Knee flexion 4- 4+  Knee extension 4+ 5  Ankle dorsiflexion 4 5   (Blank rows = not tested)   FUNCTIONAL TESTS:  5  times sit to stand: 14.97 Timed up and go (TUG): 12.33 6 minute walk test: deferred to next visit 10 meter walk test: 11.78 sec   GAIT: Distance walked: 20 meters Assistive device utilized: Single point cane Level of assistance: CGA Comments:  Pt with unsteady gait pattern without UE support, due to knee instability.  Pt lacking adequate medial quad strength for proper IR of the R LE to have proper gait pattern.   TODAY'S TREATMENT: DATE: 04/17/22  TherEx:  Seated hamstring curls with BTB, 2x15 each LE Standing terminal knee extension into rainbow physioball, 2x15 Supine SLR with quad set prior to lift off to prevent extension lag, 2x10 with slowed eccentric lowering Prone quad stretch, 30 sec bouts x4 with overpressure from therapist Seated leg press, 85# with B LE, 2x10 Seated singular leg press, 40#, with R LE, 2x10 Standing squats with rainbow physioball between knees, 2x15  Manual:  STM to anterior tibialis and distal quads for reduction of tissue restriction Patellar mobilization in all planes of mobility for improved ROM of the knee and pain reduction in the R knee Supine low load long duration  knee extension for improved terminal knee extension, x3 minutes Cross friction massage applied to the incision site to prevent tissue adhesion    PATIENT EDUCATION:   Education details: Pt educated on role of PT and services provided during current POC, along with prognosis and information about the clinic. Person educated: Patient Education method: Explanation Education comprehension: verbalized understanding  HOME EXERCISE PROGRAM:  Access Code: [email or web address] URL: [email or web address] Date: 04/07/2022 Prepared by: [place]  Exercises - Supine Active Straight Leg Raise  - 1 x daily - 7 x weekly - 3 sets - 10 reps - Seated Long Arc Quad  - 1 x daily - 7 x weekly - 3 sets - 10 reps - Supine Heel Slide  - 1 x daily - 7 x weekly - 3 sets - 10 reps - Mini  Squat with Counter Support  - 1 x daily - 7 x weekly - 3 sets - 10 reps - Supine Knee Extension Strengthening  - 1 x daily - 7 x weekly - 3 sets - 10 reps - Seated Heel Slide  - 1 x daily - 7 x weekly - 3 sets - 10 reps  ASSESSMENT:  CLINICAL IMPRESSION:  Pt tolerated treatment session well and put forth great effort throughout the session.  Pt noted to have good technique with the squats and leg press after being verbally instructed on how to perform.  Pt to continue with current HEP in order to continue improving LE strength and ROM.   Pt will continue to benefit from skilled therapy to address remaining deficits in order to improve overall QoL and return to PLOF.          OBJECTIVE IMPAIRMENTS: Abnormal gait, decreased activity tolerance, decreased balance, decreased endurance, decreased knowledge of use of DME, decreased mobility, difficulty walking, decreased ROM, decreased strength, impaired flexibility, and pain.   ACTIVITY LIMITATIONS: bending, sitting, standing, squatting, stairs, transfers, bed mobility, toileting, dressing, and locomotion level  PARTICIPATION LIMITATIONS: meal prep, cleaning, laundry, shopping, community activity, and yard work  PERSONAL FACTORS: Education, Past/current experiences, Time since onset of injury/illness/exacerbation, and 1 comorbidity: arthritis  are also affecting patient's functional outcome.   REHAB POTENTIAL: Excellent  CLINICAL DECISION MAKING: Stable/uncomplicated  EVALUATION COMPLEXITY: Low   GOALS: Goals reviewed with patient? Yes  SHORT TERM GOALS: Target date: 05/05/2022  Pt will be independent with HEP in order to demonstrate increased ability to perform tasks related to occupation/hobbies. Baseline: Goal status: INITIAL    LONG TERM GOALS: Target date: 06/30/2022  1.  Patient (> 39 years old) will complete five times sit to stand test in <10 seconds indicating an increased LE strength and improved balance. Baseline: 14.97  sec Goal status: INITIAL  2.  Patient will increase FOTO score to equal to or greater than 11.78 sec to demonstrate statistically significant improvement in mobility and quality of life.  Baseline: 57/63 Goal status: INITIAL   3.  Patient will improve LEFS score by >9% to demonstrate increased functional ability to perform daily tasks. Baseline: 58.75% Goal status: INITIAL   4.  Patient will reduce timed up and go to <10 seconds to reduce fall risk and demonstrate improved transfer/gait ability. Baseline: 12.33 Goal status: INITIAL  5.  Patient will increase 10 meter walk test to >1.41ms as to improve gait speed for better community ambulation and to reduce fall risk. Baseline: 11.78 sec; 0.85 m/s Goal status: INITIAL  6.  Patient will increase six minute walk test distance to >1000 for progression to  community ambulator and improve gait ability Baseline: Not performed at this time. Goal status: INITIAL     PLAN:  PT FREQUENCY: 1-2x/week  PT DURATION: 12 weeks  PLANNED INTERVENTIONS: Therapeutic exercises, Therapeutic activity, Neuromuscular re-education, Balance training, Gait training, Patient/Family education, Self Care, Joint mobilization, Joint manipulation, Stair training, Vestibular training, Canalith repositioning, Spinal manipulation, Spinal mobilization, Cryotherapy, Moist heat, scar mobilization, and Manual therapy  PLAN FOR NEXT SESSION:   Continue with ROM and strengthening exercises.    Gwenlyn Saran, PT, DPT Physical Therapist- [facility]  04/17/22, 2:14 PM

## 2022-04-21 ENCOUNTER — Ambulatory Visit (INDEPENDENT_AMBULATORY_CARE_PROVIDER_SITE_OTHER): Payer: Medicare Other | Admitting: Orthopaedic Surgery

## 2022-04-21 ENCOUNTER — Encounter: Payer: Self-pay | Admitting: Orthopaedic Surgery

## 2022-04-21 ENCOUNTER — Other Ambulatory Visit: Payer: Self-pay | Admitting: Orthopaedic Surgery

## 2022-04-21 DIAGNOSIS — Z96651 Presence of right artificial knee joint: Secondary | ICD-10-CM

## 2022-04-21 MED ORDER — OXYCODONE HCL 5 MG PO TABS: 5.0000 mg | ORAL_TABLET | Freq: Three times a day (TID) | ORAL | 0 refills | Status: DC | PRN

## 2022-04-21 NOTE — Progress Notes (Signed)
The patient is a 57 year old who is now 6 weeks status post a right total knee arthroplasty.  She is doing excellent with physical therapy.  She has most of her pain that is severe in the mornings.  On exam her extension is full and her flexion is almost full.  The knee feels ligamentously stable.  I am very pleased how much she has pushed herself through therapy.  From my standpoint she will transition to home exercise program probably soon.  Will see her back in 4 weeks to see how she is doing overall but no x-rays are needed.  I did tell her that I will refill her oxycodone but then she needs to start weaning from there to hydrocodone next.

## 2022-04-22 ENCOUNTER — Ambulatory Visit: Payer: Medicare Other

## 2022-04-22 DIAGNOSIS — R269 Unspecified abnormalities of gait and mobility: Secondary | ICD-10-CM

## 2022-04-22 DIAGNOSIS — M25561 Pain in right knee: Secondary | ICD-10-CM

## 2022-04-22 DIAGNOSIS — R29898 Other symptoms and signs involving the musculoskeletal system: Secondary | ICD-10-CM | POA: Diagnosis not present

## 2022-04-22 DIAGNOSIS — M25661 Stiffness of right knee, not elsewhere classified: Secondary | ICD-10-CM | POA: Diagnosis not present

## 2022-04-22 DIAGNOSIS — R262 Difficulty in walking, not elsewhere classified: Secondary | ICD-10-CM | POA: Diagnosis not present

## 2022-04-22 DIAGNOSIS — M5459 Other low back pain: Secondary | ICD-10-CM | POA: Diagnosis not present

## 2022-04-22 DIAGNOSIS — M542 Cervicalgia: Secondary | ICD-10-CM | POA: Diagnosis not present

## 2022-04-22 DIAGNOSIS — M6281 Muscle weakness (generalized): Secondary | ICD-10-CM

## 2022-04-22 NOTE — Therapy (Signed)
OUTPATIENT PHYSICAL THERAPY LOWER EXTREMITY TREATMENT   Patient Name: Megan Jimenez MRN: [id number] DOB:03-May-1965, 57 y.o., female Today's Date: 04/22/2022  END OF SESSION:  PT End of Session - 04/22/22 1617     Visit Number 5    Number of Visits 24    Date for PT Re-Evaluation 06/30/22    PT Start Time 1616    PT Stop Time 6834    PT Time Calculation (min) 37 min    Equipment Utilized During Treatment Gait belt    Activity Tolerance Patient tolerated treatment well    Behavior During Therapy WFL for tasks assessed/performed               Past Medical History:  Diagnosis Date   Anxiety    Arthritis    Asthma    Bipolar 1 disorder ([place])    COVID-19 03/29/2021   Family history of breast cancer    Family history of colon cancer    Family history of leukemia    Family history of lung cancer    Family history of ovarian cancer    Family history of throat cancer    Pre-diabetes    Past Surgical History:  Procedure Laterality Date   ANKLE FRACTURE SURGERY Right 1992   ANTERIOR CERVICAL DECOMP/DISCECTOMY FUSION N/A 04/23/2021   Procedure: ANTERIOR CERVICAL DISCECTOMIES AND FUSION C4-5, C5-6 AND C6-7 WITH PLATES, SCREWS, ALLOGRAFT ACDF GRAFT, LOCAL BONE GRAFT, VIVIGEN;  Surgeon: Jessy Oto, MD;  Location: [place];  Service: Orthopedics;  Laterality: N/A;   CARPAL TUNNEL RELEASE Left 04/23/2021   Procedure: LEFT OPEN CARPAL TUNNEL RELEASE;  Surgeon: Jessy Oto, MD;  Location: [place];  Service: Orthopedics;  Laterality: Left;   CHOLECYSTECTOMY     COLONOSCOPY WITH PROPOFOL N/A 07/09/2020   Procedure: COLONOSCOPY WITH PROPOFOL;  Surgeon: Jonathon Bellows, MD;  Location: [facility] ENDOSCOPY;  Service: Gastroenterology;  Laterality: N/A;   TOTAL KNEE ARTHROPLASTY Right 03/11/2022   Procedure: RIGHT TOTAL KNEE ARTHROPLASTY;  Surgeon: Mcarthur Rossetti, MD;  Location: [place];  Service: Orthopedics;  Laterality: Right;  Needs RNFA   Patient Active Problem List   Diagnosis Date  Noted   Status post total right knee replacement 03/11/2022   Status post total knee replacement 03/11/2022   Cervical radiculopathy 01/22/2022   Unilateral primary osteoarthritis, right knee 12/25/2021   Prediabetes 11/01/2021   OAB (overactive bladder) 08/22/2021   Vaginal atrophy 08/22/2021   Primary hypertension 08/22/2021   Hyperglycemia 07/24/2021   Morbid obesity ([place]) 06/14/2021   Asthmatic bronchitis , chronic 05/16/2021   Herniated nucleus pulposus, cervical 04/23/2021    Class: Chronic   Spinal stenosis of cervical region 04/23/2021    Class: Chronic   S/P cervical spinal fusion 04/23/2021   Carpal tunnel syndrome, left upper limb 02/28/2021   Genetic testing 12/12/2020   Other spondylosis with radiculopathy, cervical region 11/29/2020   Osteoarthritis of thoracic spine 11/29/2020   Abnormal gait 11/29/2020   Paresthesia 11/29/2020   Need for shingles vaccine 11/29/2020   Need for hepatitis C screening test 11/29/2020   Weakness of both lower extremities 11/29/2020   Muscle spasm of back 11/29/2020   Mild intermittent asthma without complication 19/62/2297   Family history of breast cancer 11/28/2020   Family history of ovarian cancer 11/28/2020   Family history of colon cancer 11/28/2020   Family history of throat cancer 11/28/2020   Family history of lung cancer 11/28/2020   Family history of leukemia 11/28/2020   Allergic rhinitis  11/17/2014   Anxiety 09/08/2014   Acute asthma exacerbation 09/08/2014   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/08/2014   Headache, migraine 09/08/2014   Excess, menstruation 09/08/2014    PCP: Tally Joe, FNP  REFERRING PROVIDER: Tally Joe, FNP  REFERRING DIAG:  M17.11 (ICD-10-CM) - Unilateral primary osteoarthritis, right knee  Z96.651 (ICD-10-CM) - Status post total right knee replacement    THERAPY DIAG:  Decreased range of motion (ROM) of right knee  Acute pain of right knee  Right leg weakness  Muscle weakness  (generalized)  Gait abnormality  Difficulty in walking, not elsewhere classified  Other low back pain  Cervicalgia  Rationale for Evaluation and Treatment: Rehabilitation  ONSET DATE: 03/11/22  SUBJECTIVE:   SUBJECTIVE STATEMENT:  Pt was able to participate with taking the dogs to the dog park, without any difficulty.  Pt notes that she is feeling more normal with the walking.     PERTINENT HISTORY:  Pt is s/p R TKA on 03/11/22.  Pt received HHPT following surgery and is not seeking outpatient services at this location.  No restrictions according to pt at this time.    PAIN:  Are you having pain? Yes: NPRS scale: 2-3/10 Pain location: R Lateral Portion Pain description: Burning around the numb portion/shooting pains occasionally Aggravating factors: Waking up in the AM with stiffness Relieving factors: Ice and Movement  PRECAUTIONS: None  WEIGHT BEARING RESTRICTIONS: No  FALLS: Has patient fallen in last 6 months? Yes. Number of falls 2  LIVING ENVIRONMENT: Lives with: lives with their spouse Lives in: House/apartment Stairs: Yes: External: 4 steps; can reach both Has following equipment at home: Gilford Rile - 2 wheeled  OCCUPATION: Retired - [employer]  PLOF: Needs assistance with ADLs and Needs assistance with homemaking  PATIENT GOALS: Get back to where she can ride her motorcycle ([employer])  NEXT MD VISIT: Dr. Ninfa Linden, 03/31/22  OBJECTIVE:   DIAGNOSTIC FINDINGS:   CLINICAL DATA:  Status post right knee replacement   EXAM: PORTABLE RIGHT KNEE - 1-2 VIEW   COMPARISON:  12/02/2021   FINDINGS: New right knee prosthesis is noted in satisfactory position. Old healed proximal fibular fracture is seen.   IMPRESSION: Status post right knee replacement     Electronically Signed   By: Inez Catalina M.D.   On: 03/11/2022 19:24  PATIENT SURVEYS:  LEFS 58.75% FOTO 57/63   COGNITION: Overall cognitive status: Within functional limits for  tasks assessed     SENSATION: WFL; normal lack of lateral sensation upon testing.  EDEMA:  Some swelling noted on the distal medial portion of the incision site  POSTURE: No Significant postural limitations  PALPATION: No pain noted in the knee at this time.  LOWER EXTREMITY ROM:  Active ROM Right eval Left eval  Knee flexion 110 deg 126 deg  Knee extension 8 deg lacking 2 deg hyperextension   (Blank rows = not tested)  LOWER EXTREMITY MMT:  MMT Right eval Left eval  Hip flexion 4 4+  Hip extension 4 4+  Knee flexion 4- 4+  Knee extension 4+ 5  Ankle dorsiflexion 4 5   (Blank rows = not tested)   FUNCTIONAL TESTS:  5 times sit to stand: 14.97 Timed up and go (TUG): 12.33 6 minute walk test: deferred to next visit 10 meter walk test: 11.78 sec   GAIT: Distance walked: 20 meters Assistive device utilized: Single point cane Level of assistance: CGA Comments:  Pt with unsteady gait pattern without UE support, due to knee instability.  Pt lacking adequate medial quad strength for proper IR of the R LE to have proper gait pattern.   TODAY'S TREATMENT: DATE: 04/22/22   TherEx:  Ambulation to cancer center and back to gym to assess gait and endurance level without the use of AD Ascending/descending stairs (2 flights) for technique and assessment of strength with task Generating HEP for aquatic exercises to perform at local [employer].  Manual:  Patellar mobilization in all planes of mobility for improved ROM of the knee and pain reduction in the R knee Cross friction massage applied to the incision site to prevent tissue adhesion    PATIENT EDUCATION:   Education details: Pt educated on role of PT and services provided during current POC, along with prognosis and information about the clinic. Person educated: Patient Education method: Explanation Education comprehension: verbalized understanding  HOME EXERCISE PROGRAM:  Access Code: [email or web address] URL:  [email or web address] Date: 04/07/2022 Prepared by: [place]  Exercises - Supine Active Straight Leg Raise  - 1 x daily - 7 x weekly - 3 sets - 10 reps - Seated Long Arc Quad  - 1 x daily - 7 x weekly - 3 sets - 10 reps - Supine Heel Slide  - 1 x daily - 7 x weekly - 3 sets - 10 reps - Mini Squat with Counter Support  - 1 x daily - 7 x weekly - 3 sets - 10 reps - Supine Knee Extension Strengthening  - 1 x daily - 7 x weekly - 3 sets - 10 reps - Seated Heel Slide  - 1 x daily - 7 x weekly - 3 sets - 10 reps    Access Code: [facility] URL: [email or web address] Date: 04/22/2022 Prepared by: [employer] [facility]  Aquatic Exercises - Standing Hip Abduction Adduction at [facility]  - 1 x daily - 7 x weekly - 3 sets - 10 reps - Standing Hip Flexion Extension at [facility]  - 1 x daily - 7 x weekly - 3 sets - 10 reps - Hamstring Stretch at [facility]  - 1 x daily - 7 x weekly - 3 sets - 10 reps - Sports administrator with Noodle at [facility]  - 1 x daily - 7 x weekly - 3 sets - 10 reps - Squat  - 1 x daily - 7 x weekly - 3 sets - 10 reps - Standing March at [facility]  - 1 x daily - 7 x weekly - 3 sets - 10 reps - Heel Toe Raises at [facility]  - 1 x daily - 7 x weekly - 3 sets - 10 reps - Alternating Forward Lunge  - 1 x daily - 7 x weekly - 3 sets - 10 reps - Flutter Kick at [facility]  - 1 x daily - 7 x weekly - 3 sets - 10 reps  ASSESSMENT:  CLINICAL IMPRESSION:  Pt arrived to clinic late, however is moving well and performing well with her exercises.  Pt given HEP for aquatic exercises as noted above and will attempt to start at the [facility] with those exercises this week.  Pt also received good report from MD, who is happy with therapy progression and ability to achieve ROM at this time.  Pt advised to continue with the current HEP and to add stairs and therapy would include the leg press at next session in order to engage the quads as they are still somewhat weak.  Pt agreeable  to this current POC and will  be d/c next week.   Pt will continue to benefit from skilled therapy to address remaining deficits in order to improve overall QoL and return to PLOF.            OBJECTIVE IMPAIRMENTS: Abnormal gait, decreased activity tolerance, decreased balance, decreased endurance, decreased knowledge of use of DME, decreased mobility, difficulty walking, decreased ROM, decreased strength, impaired flexibility, and pain.   ACTIVITY LIMITATIONS: bending, sitting, standing, squatting, stairs, transfers, bed mobility, toileting, dressing, and locomotion level  PARTICIPATION LIMITATIONS: meal prep, cleaning, laundry, shopping, community activity, and yard work  PERSONAL FACTORS: Education, Past/current experiences, Time since onset of injury/illness/exacerbation, and 1 comorbidity: arthritis  are also affecting patient's functional outcome.   REHAB POTENTIAL: Excellent  CLINICAL DECISION MAKING: Stable/uncomplicated  EVALUATION COMPLEXITY: Low   GOALS: Goals reviewed with patient? Yes  SHORT TERM GOALS: Target date: 05/05/2022  Pt will be independent with HEP in order to demonstrate increased ability to perform tasks related to occupation/hobbies. Baseline: Goal status: INITIAL    LONG TERM GOALS: Target date: 06/30/2022  1.  Patient (> 49 years old) will complete five times sit to stand test in <10 seconds indicating an increased LE strength and improved balance. Baseline: 14.97 sec Goal status: INITIAL  2.  Patient will increase FOTO score to equal to or greater than 11.78 sec to demonstrate statistically significant improvement in mobility and quality of life.  Baseline: 57/63 Goal status: INITIAL   3.  Patient will improve LEFS score by >9% to demonstrate increased functional ability to perform daily tasks. Baseline: 58.75% Goal status: INITIAL   4.  Patient will reduce timed up and go to <10 seconds to reduce fall risk and demonstrate improved  transfer/gait ability. Baseline: 12.33 Goal status: INITIAL  5.  Patient will increase 10 meter walk test to >1.96ms as to improve gait speed for better community ambulation and to reduce fall risk. Baseline: 11.78 sec; 0.85 m/s Goal status: INITIAL  6.  Patient will increase six minute walk test distance to >1000 for progression to community ambulator and improve gait ability Baseline: Not performed at this time. Goal status: INITIAL     PLAN:  PT FREQUENCY: 1-2x/week  PT DURATION: 12 weeks  PLANNED INTERVENTIONS: Therapeutic exercises, Therapeutic activity, Neuromuscular re-education, Balance training, Gait training, Patient/Family education, Self Care, Joint mobilization, Joint manipulation, Stair training, Vestibular training, Canalith repositioning, Spinal manipulation, Spinal mobilization, Cryotherapy, Moist heat, scar mobilization, and Manual therapy  PLAN FOR NEXT SESSION:   Continue with ROM and strengthening exercises. D/C next week on Tuesday.   JGwenlyn Saran PT, DPT Physical Therapist- [facility] 04/22/22, 6:11 PM

## 2022-04-24 ENCOUNTER — Ambulatory Visit: Payer: Medicare Other

## 2022-04-25 ENCOUNTER — Other Ambulatory Visit: Payer: Self-pay | Admitting: Orthopaedic Surgery

## 2022-04-25 MED ORDER — OXYCODONE HCL 5 MG PO TABS: 5.0000 mg | ORAL_TABLET | Freq: Three times a day (TID) | ORAL | 0 refills | Status: DC | PRN

## 2022-04-28 ENCOUNTER — Ambulatory Visit (HOSPITAL_COMMUNITY): Payer: Medicare Other | Admitting: Physical Therapy

## 2022-04-29 ENCOUNTER — Ambulatory Visit: Payer: Medicare Other

## 2022-04-29 ENCOUNTER — Other Ambulatory Visit: Payer: Self-pay | Admitting: Family Medicine

## 2022-04-29 DIAGNOSIS — R739 Hyperglycemia, unspecified: Secondary | ICD-10-CM

## 2022-04-30 ENCOUNTER — Ambulatory Visit: Payer: Medicare Other | Admitting: Urology

## 2022-04-30 ENCOUNTER — Other Ambulatory Visit: Payer: Self-pay | Admitting: Orthopaedic Surgery

## 2022-04-30 VITALS — BP 161/95 | HR 102 | Ht 70.0 in | Wt 214.0 lb

## 2022-04-30 DIAGNOSIS — N3946 Mixed incontinence: Secondary | ICD-10-CM | POA: Diagnosis not present

## 2022-04-30 DIAGNOSIS — N3281 Overactive bladder: Secondary | ICD-10-CM

## 2022-04-30 LAB — BLADDER SCAN AMB NON-IMAGING: SCA Result: 3

## 2022-04-30 MED ORDER — OXYCODONE HCL 5 MG PO TABS: 5.0000 mg | ORAL_TABLET | Freq: Three times a day (TID) | ORAL | 0 refills | Status: DC | PRN

## 2022-04-30 MED ORDER — MIRABEGRON ER 50 MG PO TB24: 50.0000 mg | ORAL_TABLET | Freq: Every day | ORAL | 0 refills | Status: DC

## 2022-04-30 NOTE — Patient Instructions (Signed)

## 2022-04-30 NOTE — Progress Notes (Signed)
04/30/22 4:09 PM   Megan Jimenez 25-Mar-1965 [id number]  CC: Overactive bladder, urge incontinence  HPI: 57 year old female who reports at least a year of severe overactive bladder symptoms and urge incontinence as well as nocturia, and some nocturnal enuresis.  She requires numerous pads throughout the day, and will have nocturia up to 5 times at night.  She almost always will wake up wet.  She denies any stress incontinence, and leakage is always associated with urgency.  She was tried on oxybutynin by PCP, but dose unknown.  She denies any dysuria or gross hematuria.  She drinks only water during the day.  She is seeing a neurologist later this week for some nerve issues.  She has a history of neck surgery.  Urinalysis today contaminated with greater than 10 epithelial cells, 11-30 WBC, 3-10 RBC, moderate bacteria, 1+ leukocyte, nitrite negative.  Will send for culture.  PVR normal at 82m.  PMH: Past Medical History:  Diagnosis Date   Anxiety    Arthritis    Asthma    [place] 1 disorder [place]    COVID-19 03/29/2021   Family history of breast cancer    Family history of colon cancer    Family history of leukemia    Family history of lung cancer    Family history of ovarian cancer    Family history of throat cancer    Pre-diabetes     Surgical History: Past Surgical History:  Procedure Laterality Date   ANKLE FRACTURE SURGERY Right 1992   ANTERIOR CERVICAL DECOMP/DISCECTOMY FUSION N/A 04/23/2021   Procedure: ANTERIOR CERVICAL DISCECTOMIES AND FUSION C4-5, C5-6 AND C6-7 WITH PLATES, SCREWS, ALLOGRAFT ACDF GRAFT, LOCAL BONE GRAFT, VIVIGEN;  Surgeon: NJessy Oto MD;  Location[place]  Service: Orthopedics;  Laterality: N/A;   CARPAL TUNNEL RELEASE Left 04/23/2021   Procedure: LEFT OPEN CARPAL TUNNEL RELEASE;  Surgeon: NJessy Oto MD;  [place]  Service: Orthopedics;  Laterality: Left;   CHOLECYSTECTOMY     COLONOSCOPY WITH PROPOFOL N/A 07/09/2020   Procedure:  COLONOSCOPY WITH PROPOFOL;  Surgeon: AJonathon Bellows MD;  [facility]  Service: Gastroenterology;  Laterality: N/A;   TOTAL KNEE ARTHROPLASTY Right 03/11/2022   Procedure: RIGHT TOTAL KNEE ARTHROPLASTY;  Surgeon: BMcarthur Rossetti MD;  [place]  Service: Orthopedics;  Laterality: Right;  Needs RNFA    Family History: Family History  Problem Relation Age of Onset   COPD Mother    Hyperlipidemia Mother    Lung cancer Mother [phone]  Hyperlipidemia Father    Leukemia Father 535  COPD Father    Depression Sister        ANXIETY   Healthy Sister    Breast cancer Maternal Aunt        dx 341s recurrence 455s  Throat cancer Maternal Uncle    Lung cancer Paternal Uncle    Colon cancer Maternal Grandmother    Ovarian cancer Cousin 461      paternal   Ovarian cancer Cousin 57   Social History:  reports that she quit smoking about 18 years ago. Her smoking use included cigarettes. She has a 12.50 pack-year smoking history. She has never used smokeless tobacco. She reports that she does not currently use alcohol. She reports that she does not use drugs.  Physical Exam: BP (!) 161/95   Pulse (!) 102   Ht 5' 10"$  (1.778 m)   Wt 214 lb (97.1 kg)  LMP 09/28/2018   BMI 30.71 kg/m    Constitutional:  Alert and oriented, No acute distress. Cardiovascular: No clubbing, cyanosis, or edema. Respiratory: Normal respiratory effort, no increased work of breathing. GI: Abdomen is soft, nontender, nondistended, no abdominal masses  Assessment & Plan:   57 year old female with severe OAB symptoms and significant incontinence day and night, failed trial of oxybutynin(dose unknown) through PCP.  Also has some nerve related issues and is following up with neurology later this week.  We discussed that overactive bladder (OAB) is not a disease, but is a symptom complex that is generally not life-threatening.  Symptoms typically include urinary urgency, frequency, and urge  incontinence.  There are numerous treatment options, however there are risks and benefits with both medical and surgical management.  First-line treatment is behavioral therapies including bladder training, pelvic floor muscle training, and fluid management.  Second line treatments include oral antimuscarinics(Ditropan er, Trospium) and beta-3 agonist (Mybetriq). There is typically a period of medication trial (4-8 weeks) to find the optimal therapy and dosing. If symptoms are bothersome despite the above management, third line options include intra-detrusor botox, peripheral tibial nerve stimulation (PTNS), and interstim (SNS). These are more invasive treatments with higher side effect profile, but may improve quality of life for patients with severe OAB symptoms.   -I recommended a trial of Myrbetriq 50 mg daily, samples given -Follow-up urine culture, though contaminated with  squamous cells -Recommend neurology consider brain MRI to evaluate for MS -May benefit from further imaging with her low-grade microscopic hematuria on contaminated UA -RTC 4 to 6 weeks with Dr. Sheran Luz, MD 04/30/2022  [facility] [address], [place], [place] [address] [phone]

## 2022-05-01 ENCOUNTER — Ambulatory Visit: Payer: Medicare Other

## 2022-05-01 LAB — URINALYSIS, COMPLETE
Bilirubin, UA: NEGATIVE
Glucose, UA: NEGATIVE
Nitrite, UA: NEGATIVE
Specific Gravity, UA: 1.02 (ref 1.005–1.030)
Urobilinogen, Ur: 0.2 mg/dL (ref 0.2–1.0)
pH, UA: 7.5 (ref 5.0–7.5)

## 2022-05-01 LAB — MICROSCOPIC EXAMINATION: Epithelial Cells (non renal): >10 /hpf — AB (ref 0–10)

## 2022-05-02 ENCOUNTER — Ambulatory Visit: Payer: Medicare Other | Admitting: Neurology

## 2022-05-02 ENCOUNTER — Other Ambulatory Visit: Payer: Self-pay

## 2022-05-02 ENCOUNTER — Other Ambulatory Visit: Payer: Medicare Other

## 2022-05-02 VITALS — BP 131/86 | HR 84 | Ht 70.0 in | Wt 214.0 lb

## 2022-05-02 DIAGNOSIS — R269 Unspecified abnormalities of gait and mobility: Secondary | ICD-10-CM | POA: Diagnosis not present

## 2022-05-02 DIAGNOSIS — R8290 Unspecified abnormal findings in urine: Secondary | ICD-10-CM

## 2022-05-02 DIAGNOSIS — R202 Paresthesia of skin: Secondary | ICD-10-CM

## 2022-05-02 DIAGNOSIS — R32 Unspecified urinary incontinence: Secondary | ICD-10-CM | POA: Insufficient documentation

## 2022-05-02 DIAGNOSIS — M5412 Radiculopathy, cervical region: Secondary | ICD-10-CM

## 2022-05-02 NOTE — Progress Notes (Signed)
Chief Complaint  Patient presents with   Procedure    Rm EMG/NCV 4.      ASSESSMENT AND PLAN  Megan Jimenez is a 57 y.o. female   Cervical degenerative changes, status post ACDF C4-5-6, radiating pain to left shoulder left arm paresthesia,   EMG nerve conduction study today showed mild chronic left C5 radiculopathy, mild left carpal tunnel syndrome, which can potentially explain her left arm symptoms, presurgical MRI cervical spine reveals significant degenerative changes with severe left C4-5-6 foraminal stenosis, She is also concerned about worsening urinary incontinence, especially since 2024, worried about the possibility of MS, desire further evaluation, Proceed with MRI of brain   DIAGNOSTIC DATA (LABS, IMAGING, TESTING) - I reviewed patient records, labs, notes, testing and imaging myself where available.   MEDICAL HISTORY:  Megan Jimenez, is a 57 year old female seen in request by her orthopedic surgeon Dr. Jessy Oto, for evaluation of left arm radiating paresthesia, initial evaluation is on January 22, 2022, primary care is at [facility], Gwyneth Sprout, FNP   I reviewed and summarized the referring note. PMHX Depression, anxiety Bipolar DM Obesity Cervical ACDF C4-5-6-7, in Feb 2023, presenting with neck pain, radiating pain to both shoulder, she still has left shoulder, arm paresthesia, weakness,  Chronic low back pain, DJD, radiating pain to left leg. Pending right knee replacement on Dec 26th 2023.  She had multiple joint degenerative disease, pending right knee replacement March 11, 2022, complains of significant knee pain, gait abnormality because of that,   Also had a history of cervical decompression surgery February 2023 by Dr. Louanne Skye, ACDF C4-5 C6-7, prior to the surgery she presented with significant neck pain, radiating pain to both shoulders especially the left side,  I personally reviewed presurgical MRI cervical spine  October 2022, multilevel degenerative disease, severe left foraminal stenosis at C4-5, C5-6, moderate on the left at C3-4, right at C4-5, C5-6 bilaterally C6-7  MRI of lumbar showed mild degenerative changes, deformity of right eccentric L4 superior endplate without marrow edema, potentially remote fracture, no signal in foraminal or canal stenosis.  She complains of long history of bladder incontinence since 2018, urinary urgency, to the point of daily accident where past, also worsening bowel incontinence, accident few times a week since 2023  Cervical decompression surgery did not help her neck pain, but she continued to have intermittent radiating discomfort from left neck to left shoulder, left upper extremity, she did feel numbness of her entire left arm, subjective weakness, also some puzzling sensation at the left lateral cervical collar,  She also complains of intermittent radiating pain from left lower back to left posterior thigh,  Update Feb 16/2024, Patient to return for electrodiagnostic study today, showed evidence of mild chronic left C5 radiculopathy, mild left carpal tunnel syndromes  We again reviewed presurgical MRI, multilevel degenerative changes, severe foraminal stenosis at left C C4-5-6, which can potentially explain her current left arm symptoms,  Today she also complains of worsening lower urinary incontinence, is under the care of urologist, cervical decompression surgery did not help her urinary urgency, sometimes to the point of incontinence, desire further evaluation, worried about the possibility of MS, denies history of visual loss,   PHYSICAL EXAM:   Vitals:   05/02/22 1040  BP: 131/86  Pulse: 84  Weight: 214 lb (97.1 kg)  Height: 5' 10"$  (1.778 m)   Body mass index is 30.71 kg/m.  PHYSICAL EXAMNIATION:  Gen: NAD, conversant, well nourised, well groomed  Cardiovascular: Regular rate rhythm, no peripheral edema, warm,  nontender. Eyes: Conjunctivae clear without exudates or hemorrhage Neck: Supple, no carotid bruits. Pulmonary: Clear to auscultation bilaterally   NEUROLOGICAL EXAM:  MENTAL STATUS: Speech/cognition: Awake, alert, oriented to history taking and casual conversation CRANIAL NERVES: CN II: Visual fields are full to confrontation. Pupils are round equal and briskly reactive to light. CN III, IV, VI: extraocular movement are normal. No ptosis. CN V: Facial sensation is intact to light touch CN VII: Face is symmetric with normal eye closure  CN VIII: Hearing is normal to causal conversation. CN IX, X: Phonation is normal. CN XI: Head turning and shoulder shrug are intact  MOTOR: Limited by multiple joints pain, no significant muscle weakness, well-healed right knee surgical scar REFLEXES: Reflexes are 1 and symmetric at the biceps, triceps, knees, and ankles. Plantar responses are flexor.  SENSORY: Intact to light touch, pinprick and vibratory sensation are intact in fingers and toes.  COORDINATION: There is no trunk or limb dysmetria noted.  GAIT/STANCE: Need push-up to get up from seated position, antalgic, dragging right leg  REVIEW OF SYSTEMS:  Full 14 system review of systems performed and notable only for as above All other review of systems were negative.   ALLERGIES: No Known Allergies  HOME MEDICATIONS: Current Outpatient Medications  Medication Sig Dispense Refill   albuterol (PROVENTIL) (2.5 MG/3ML) 0.083% nebulizer solution Take 3 mLs (2.5 mg total) by nebulization every 6 (six) hours as needed for wheezing or shortness of breath. 75 mL 0   albuterol (VENTOLIN HFA) 108 (90 Base) MCG/ACT inhaler TAKE 2 PUFFS BY MOUTH EVERY 4 HOURS AS NEEDED FOR COUGH OR WHEEZING 8.5 each 0   ALPRAZolam (XANAX) 1 MG tablet Take 1 mg by mouth 4 (four) times daily as needed for anxiety.     Ascorbic Acid (VITAMIN C) 1000 MG tablet Take 1,000 mg by mouth daily.     Calcium  Carb-Cholecalciferol (CALCIUM/VITAMIN D PO) Take 1 tablet by mouth daily.     CAPLYTA 42 MG CAPS Take 42 mg by mouth at bedtime.     [employer] ASPIRIN ADULT LOW DOSE 81 MG chewable tablet CHEW 1 TABLET (81 MG TOTAL) BY MOUTH 2 (TWO) TIMES DAILY. 180 tablet 1   DULoxetine (CYMBALTA) 60 MG capsule Take 1 capsule (60 mg total) by mouth daily. 90 capsule 3   Eszopiclone 3 MG TABS Take 1 tablet (3 mg total) by mouth at bedtime. (Patient taking differently: Take 3 mg by mouth at bedtime as needed (sleep).) 90 tablet 1   ferrous sulfate 325 (65 FE) MG tablet Take 325 mg by mouth daily with breakfast.     fluticasone (FLONASE) 50 MCG/ACT nasal spray SPRAY 2 SPRAYS INTO EACH NOSTRIL EVERY DAY 48 mL 3   fluticasone furoate-vilanterol (BREO ELLIPTA) 200-25 MCG/ACT AEPB Inhale 1 puff into the lungs daily.     gabapentin (NEURONTIN) 600 MG tablet Take 1 tablet (600 mg total) by mouth 3 (three) times daily as needed (Burning pain). 60 tablet 0   loratadine (CLARITIN) 10 MG tablet Take 10 mg by mouth daily.     meloxicam (MOBIC) 15 MG tablet Take 1 tablet (15 mg total) by mouth daily. 30 tablet 2   metFORMIN (GLUCOPHAGE-XR) 750 MG 24 hr tablet TAKE 1 TABLET (750 MG TOTAL) BY MOUTH 2 (TWO) TIMES DAILY AFTER A MEAL. 180 tablet 0   methocarbamol (ROBAXIN) 500 MG tablet TAKE 1 TABLET BY MOUTH EVERY 6 HOURS AS NEEDED FOR MUSCLE SPASMS. [place]  tablet 1   mirabegron ER (MYRBETRIQ) 50 MG TB24 tablet Take 1 tablet (50 mg total) by mouth daily. 30 tablet 0   montelukast (SINGULAIR) 10 MG tablet TAKE 1 TABLET BY MOUTH EVERYDAY AT BEDTIME 90 tablet 3   Multiple Vitamin (MULTIVITAMIN WITH MINERALS) TABS tablet Take 1 tablet by mouth daily.     Omega-3 Fatty Acids (FISH OIL PO) Take 1 capsule by mouth daily.     oxyCODONE (OXY IR/ROXICODONE) 5 MG immediate release tablet Take 1-2 tablets (5-10 mg total) by mouth 3 (three) times daily as needed for severe pain. 30 tablet 0   phentermine 37.5 MG capsule Take 1 capsule (37.5 mg total) by  mouth every morning. 90 capsule 0   pregabalin (LYRICA) 100 MG capsule Take 1 capsule (100 mg total) by mouth 3 (three) times daily. 90 capsule 5   tiZANidine (ZANAFLEX) 4 MG tablet TAKE 1 TABLET BY MOUTH EVERY 6 HOURS AS NEEDED FOR MUSCLE SPASMS. 360 tablet 1   vitamin B-12 (CYANOCOBALAMIN) 500 MCG tablet Take 500 mcg by mouth daily.     Vitamin D, Ergocalciferol, (DRISDOL) 50000 units CAPS capsule Take 50,000 Units by mouth every Friday.   5   No current facility-administered medications for this visit.    PAST MEDICAL HISTORY: Past Medical History:  Diagnosis Date   Anxiety    Arthritis    Asthma    Bipolar 1 disorder ([place])    COVID-19 03/29/2021   Family history of breast cancer    Family history of colon cancer    Family history of leukemia    Family history of lung cancer    Family history of ovarian cancer    Family history of throat cancer    Pre-diabetes     PAST SURGICAL HISTORY: Past Surgical History:  Procedure Laterality Date   ANKLE FRACTURE SURGERY Right 1992   ANTERIOR CERVICAL DECOMP/DISCECTOMY FUSION N/A 04/23/2021   Procedure: ANTERIOR CERVICAL DISCECTOMIES AND FUSION C4-5, C5-6 AND C6-7 WITH PLATES, SCREWS, ALLOGRAFT ACDF GRAFT, LOCAL BONE GRAFT, VIVIGEN;  Surgeon: Jessy Oto, MD;  Location: [facility];  Service: Orthopedics;  Laterality: N/A;   CARPAL TUNNEL RELEASE Left 04/23/2021   Procedure: LEFT OPEN CARPAL TUNNEL RELEASE;  Surgeon: Jessy Oto, MD;  Location: [facility];  Service: Orthopedics;  Laterality: Left;   CHOLECYSTECTOMY     COLONOSCOPY WITH PROPOFOL N/A 07/09/2020   Procedure: COLONOSCOPY WITH PROPOFOL;  Surgeon: Jonathon Bellows, MD;  Location: [facility];  Service: Gastroenterology;  Laterality: N/A;   TOTAL KNEE ARTHROPLASTY Right 03/11/2022   Procedure: RIGHT TOTAL KNEE ARTHROPLASTY;  Surgeon: Mcarthur Rossetti, MD;  Location: [facility];  Service: Orthopedics;  Laterality: Right;  Needs RNFA    FAMILY HISTORY: Family History  Problem  Relation Age of Onset   COPD Mother    Hyperlipidemia Mother    Lung cancer Mother 62   Hyperlipidemia Father    Leukemia Father 42   COPD Father    Depression Sister        ANXIETY   Healthy Sister    Breast cancer Maternal Aunt        dx 14s, recurrence 48s   Throat cancer Maternal Uncle    Lung cancer Paternal Uncle    Colon cancer Maternal Grandmother    Ovarian cancer Cousin 79       paternal   Ovarian cancer Cousin 57    SOCIAL HISTORY: Social History   Socioeconomic History   Marital status: Married  Spouse name: Not on file   Number of children: 2   Years of education: College   Highest education level: Not on file  Occupational History    Comment: Full-time  Tobacco Use   Smoking status: Former    Packs/day: 0.50    Years: 25.00    Total pack years: 12.50    Types: Cigarettes    Quit date: 03/17/2004    Years since quitting: 18.1   Smokeless tobacco: Never  Vaping Use   Vaping Use: Some days   Substances: Flavoring  Substance and Sexual Activity   Alcohol use: Not Currently   Drug use: No   Sexual activity: Not on file  Other Topics Concern   Not on file  Social History Narrative   Lives at home with husband   Social Determinants of Health   Financial Resource Strain: Low Risk  (09/29/2017)   Overall Financial Resource Strain (CARDIA)    Difficulty of Paying Living Expenses: Not hard at all  Food Insecurity: No Food Insecurity (09/29/2017)   Hunger Vital Sign    Worried About Running Out of Food in the Last Year: Never true    Ran Out of Food in the Last Year: Never true  Transportation Needs: No Transportation Needs (09/29/2017)   PRAPARE - Hydrologist (Medical): No    Lack of Transportation (Non-Medical): No  Physical Activity: Insufficiently Active (09/29/2017)   Exercise Vital Sign    Days of Exercise per Week: 2 days    Minutes of Exercise per Session: 20 min  Stress: No Stress Concern Present (09/29/2017)    [place]    Feeling of Stress : Only a little  Social Connections: Moderately Integrated (09/29/2017)   Social Connection and Isolation Panel [NHANES]    Frequency of Communication with Friends and Family: Three times a week    Frequency of Social Gatherings with Friends and Family: Twice a week    Attends Religious Services: 1 to 4 times per year    Active Member of [employer] or Organizations: No    Attends Archivist Meetings: Never    Marital Status: Married  Human resources officer Violence: Not At Risk (09/29/2017)   Humiliation, Afraid, Rape, and Kick questionnaire    Fear of Current or Ex-Partner: No    Emotionally Abused: No    Physically Abused: No    Sexually Abused: No      Marcial Pacas, M.D. Ph.D.  [facility] [address] [place], [place] [address] Ph: [phone] Fax: [phone]  CC:  Gwyneth Sprout, [place],   [id number]  Gwyneth Sprout, FNP

## 2022-05-02 NOTE — Procedures (Signed)
Full Name: Megan Jimenez Gender: Female MRN #: [id number] Date of Birth: [birth year]    Visit Date: 05/02/2022 10:37 Age: 57 Years Examining Physician: Dr. Marcial Pacas Referring Physician: Dr. Marcial Pacas Height: 5 feet 9 inch History: 57 year old right-handed female with history of cervical decompression surgery, now presenting with intermittent left arm paresthesia  Summary of the test:  Nerve conduction study: Bilateral sural, superficial peroneal sensory responses were within normal limit.  Right median sensory response showed borderline prolonged peak latency with normal snap amplitude.  Right ulnar sensory response showed low normal range snap amplitude with normal peak latency  Right tibial motor responses were normal, left tibial, bilateral peroneal motor responses showed mildly decreased CMAP amplitude, this can be due to technical difficulties, suboptimal stimulations.  Left median and ulnar motor responses were normal.  Electromyography: Selected needle examination of bilateral upper extremity muscles and cervical paraspinal muscles were performed.  There is evidence of mild chronic neuropathic changes involving left C5 myotomes, there was no evidence of active denervation at left cervical paraspinal muscles.  Conclusion: This is a mild abnormal study.  There is evidence of mild left chronic C5 radiculopathy, in addition, there is also evidence of mild left median neuropathy consistent with mild left carpal tunnel syndrome.      ------------------------------- Marcial Pacas M.D. Ph.D.  [facility] [address] [place],  [address] Tel: [phone] Fax: [phone]  Verbal informed consent was obtained from the patient, patient was informed of potential risk of procedure, including bruising, bleeding, hematoma formation, infection, muscle weakness, muscle pain, numbness, among others.        [place]    Nerve / Sites Muscle Latency  Ref. Amplitude Ref. Rel Amp Segments Distance Velocity Ref. Area    ms ms mV mV %  cm m/s m/s mVms  L Median - APB     Wrist APB 4.1 ?4.4 4.5 ?4.0 100 Wrist - APB 7   14.4     Upper arm APB 7.7  4.4  98.7 Upper arm - Wrist 20 56 ?49 18.1  L Ulnar - ADM     Wrist ADM 2.9 ?3.3 6.8 ?6.0 100 Wrist - ADM 7   27.6     B.Elbow ADM 5.1  7.0  103 B.Elbow - Wrist 14 64 ?49 29.2     A.Elbow ADM 7.6  6.9  98.4 A.Elbow - B.Elbow 15 59 ?49 27.6  R Peroneal - EDB     Ankle EDB 5.1 ?6.5 3.2 ?2.0 100 Ankle - EDB 9   13.7     Fib head EDB 11.6  1.4  43.7 Fib head - Ankle 25 39 ?44 6.5     Pop fossa EDB 14.2  0.7  46.8 Pop fossa - Fib head 13 49 ?44 2.9         Pop fossa - Ankle      L Peroneal - EDB     Ankle EDB 5.6 ?6.5 1.6 ?2.0 100 Ankle - EDB 9   7.5     Fib head EDB 11.1  1.6  102 Fib head - Ankle 24 43 ?44 9.5     Pop fossa EDB 14.3  1.2  70.6 Pop fossa - Fib head 13 41 ?44 6.3         Pop fossa - Ankle      R Tibial - AH     Ankle AH 5.5 ?5.8 6.0 ?4.0 100 Ankle - AH 9  18.1     Pop fossa AH 15.7  6.0  100 Pop fossa - Ankle 42 41 ?41 19.7  L Tibial - AH     Ankle AH 5.7 ?5.8 3.0 ?4.0 100 Ankle - AH 9   6.6     Pop fossa AH 14.1  1.6  52.3 Pop fossa - Ankle 39 47 ?41 4.2                 SNC    Nerve / Sites Rec. Site Peak Lat Ref.  Amp Ref. Segments Distance    ms ms V V  cm  R Sural - Ankle (Calf)     Calf Ankle 3.0 ?4.4 7 ?6 Calf - Ankle 14  L Sural - Ankle (Calf)     Calf Ankle 3.9 ?4.4 10 ?6 Calf - Ankle 14  R Superficial peroneal - Ankle     Lat leg Ankle 4.3 ?4.4 6 ?6 Lat leg - Ankle 14  L Superficial peroneal - Ankle     Lat leg Ankle 3.9 ?4.4 6 ?6 Lat leg - Ankle 14  L Median - Orthodromic (Dig II, Mid palm)     Dig II Wrist 3.4 ?3.4 16 ?10 Dig II - Wrist 13  L Ulnar - Orthodromic, (Dig V, Mid palm)     Dig V Wrist 2.7 ?3.1 4 ?5 Dig V - Wrist 41                 F  Wave    Nerve F Lat Ref.   ms ms  R Tibial - AH 53.1 ?56.0  L Ulnar - ADM 29.2 ?32.0         EMG Summary  Table    Spontaneous MUAP Recruitment  Muscle IA Fib PSW Fasc Other Amp Dur. Poly Pattern  L. First dorsal interosseous Normal None None None _______ Normal Normal Normal Normal  L. Pronator teres Normal None None None _______ Normal Normal Normal Normal  L. Biceps brachii Normal None None None _______ Normal Normal Normal Reduced  L. Deltoid Normal None None None _______ Normal Normal Normal Reduced  L. Triceps brachii Normal None None None _______ Normal Normal Normal Normal  L. Brachioradialis Normal None None None _______ Normal Normal Normal Normal  R. Brachioradialis Normal None None None _______ Normal Normal Normal Normal  R. Biceps brachii Normal None None None _______ Normal Normal Normal Reduced  R. Deltoid Normal None None None _______ Normal Normal Normal Reduced  R. Triceps brachii Normal None None None _______ Normal Normal Normal Normal  R. Cervical paraspinals Normal None None None _______ Normal Normal Normal Normal  L. Cervical paraspinals Normal None None None _______ Normal Normal Normal Normal

## 2022-05-06 ENCOUNTER — Other Ambulatory Visit: Payer: Self-pay | Admitting: Orthopaedic Surgery

## 2022-05-06 ENCOUNTER — Ambulatory Visit: Payer: Medicare Other

## 2022-05-07 ENCOUNTER — Ambulatory Visit: Payer: Medicare Other | Admitting: Orthopedic Surgery

## 2022-05-07 ENCOUNTER — Other Ambulatory Visit: Payer: Self-pay | Admitting: Orthopaedic Surgery

## 2022-05-07 ENCOUNTER — Telehealth: Payer: Medicare Other | Admitting: Physician Assistant

## 2022-05-07 DIAGNOSIS — B9689 Other specified bacterial agents as the cause of diseases classified elsewhere: Secondary | ICD-10-CM

## 2022-05-07 DIAGNOSIS — J069 Acute upper respiratory infection, unspecified: Secondary | ICD-10-CM

## 2022-05-07 MED ORDER — OXYCODONE HCL 5 MG PO TABS: 5.0000 mg | ORAL_TABLET | Freq: Three times a day (TID) | ORAL | 0 refills | Status: DC | PRN

## 2022-05-07 MED ORDER — AZELASTINE HCL 0.1 % NA SOLN: 1.0000 | Freq: Two times a day (BID) | NASAL | 0 refills | Status: DC

## 2022-05-07 MED ORDER — PROMETHAZINE-DM 6.25-15 MG/5ML PO SYRP: 5.0000 mL | ORAL_SOLUTION | Freq: Four times a day (QID) | ORAL | 0 refills | Status: DC | PRN

## 2022-05-07 MED ORDER — AMOXICILLIN-POT CLAVULANATE 875-125 MG PO TABS: 1.0000 | ORAL_TABLET | Freq: Two times a day (BID) | ORAL | 0 refills | Status: DC

## 2022-05-07 NOTE — Progress Notes (Signed)
E-Visit for Upper Respiratory Infection   We are sorry you are not feeling well.  Here is how we plan to help!  Based on what you have shared with me, it looks like you may have a bacterial upper respiratory infection.   Symptoms vary from person to person, with common symptoms including sore throat, cough, fatigue or lack of energy and feeling of general discomfort.  A low-grade fever of up to 100.4 may present, but is often uncommon.  Symptoms vary however, and are closely related to a person's age or underlying illnesses.  The most common symptoms associated with an upper respiratory infection are nasal discharge or congestion, cough, sneezing, headache and pressure in the ears and face.  These symptoms usually persist for about 3 to 10 days, but can last up to 2 weeks.  It is important to know that upper respiratory infections do not cause serious illness or complications in most cases.    Upper respiratory infections can be transmitted from person to person, with the most common method of transmission being a person's hands. Also, these can be transmitted when someone coughs or sneezes; thus, it is important to cover the mouth to reduce this risk.  To keep the spread of the illness at [place], good hand hygiene is very important.  I have prescribed Augmentin 875-146m Take 1 tablet twice daily for 10 days  For nasal congestion, you may use an oral decongestants such as Mucinex D or if you have glaucoma or high blood pressure use plain Mucinex.  Saline nasal spray or nasal drops can help and can safely be used as often as needed for congestion.  For your congestion, I have prescribed Azelastine nasal spray two sprays in each nostril twice a day  If you do not have a history of heart disease, hypertension, diabetes or thyroid disease, prostate/bladder issues or glaucoma, you may also use Sudafed to treat nasal congestion.  It is highly recommended that you consult with a pharmacist or your primary care  physician to ensure this medication is safe for you to take.     If you have a cough, you may use cough suppressants such as Delsym and Robitussin.  If you have glaucoma or high blood pressure, you can also use Coricidin HBP.   For cough I have prescribed for you Promethazine DM cough syrup Take 585mevery 6-8 hours as needed for cough.  If you have a sore or scratchy throat, use a saltwater gargle-  to  teaspoon of salt dissolved in a 4-ounce to 8-ounce glass of warm water.  Gargle the solution for approximately 15-30 seconds and then spit.  It is important not to swallow the solution.  You can also use throat lozenges/cough drops and Chloraseptic spray to help with throat pain or discomfort.  Warm or cold liquids can also be helpful in relieving throat pain.  For headache, pain or general discomfort, you can use Ibuprofen or Tylenol as directed.   Some authorities believe that zinc sprays or the use of Echinacea may shorten the course of your symptoms.   HOME CARE Only take medications as instructed by your medical team. Be sure to drink plenty of fluids. Water is fine as well as fruit juices, sodas and electrolyte beverages. You may want to stay away from caffeine or alcohol. If you are nauseated, try taking small sips of liquids. How do you know if you are getting enough fluid? Your urine should be a pale yellow or almost colorless.  Get rest. Taking a steamy shower or using a humidifier may help nasal congestion and ease sore throat pain. You can place a towel over your head and breathe in the steam from hot water coming from a faucet. Using a saline nasal spray works much the same way. Cough drops, hard candies and sore throat lozenges may ease your cough. Avoid close contacts especially the very young and the elderly Cover your mouth if you cough or sneeze Always remember to wash your hands.   GET HELP RIGHT AWAY IF: You develop worsening fever. If your symptoms do not improve within  10 days You develop yellow or green discharge from your nose over 3 days. You have coughing fits You develop a severe head ache or visual changes. You develop shortness of breath, difficulty breathing or start having chest pain Your symptoms persist after you have completed your treatment plan  MAKE SURE YOU  Understand these instructions. Will watch your condition. Will get help right away if you are not doing well or get worse.  Thank you for choosing an e-visit.  Your e-visit answers were reviewed by a board certified advanced clinical practitioner to complete your personal care plan. Depending upon the condition, your plan could have included both over the counter or prescription medications.  Please review your pharmacy choice. Make sure the pharmacy is open so you can pick up prescription now. If there is a problem, you may contact your provider through [employer] and have the prescription routed to another pharmacy.  Your safety is important to [place]. If you have drug allergies check your prescription carefully.   For the next 24 hours you can use MyChart to ask questions about today's visit, request a non-urgent call back, or ask for a work or school excuse. You will get an email in the next two days asking about your experience. I hope that your e-visit has been valuable and will speed your recovery.  I have spent 5 minutes in review of e-visit questionnaire, review and updating patient chart, medical decision making and response to patient.   Mar Daring, PA-C

## 2022-05-08 ENCOUNTER — Ambulatory Visit: Payer: Medicare Other | Admitting: Orthopedic Surgery

## 2022-05-08 LAB — CULTURE, URINE COMPREHENSIVE

## 2022-05-14 ENCOUNTER — Other Ambulatory Visit: Payer: Self-pay | Admitting: Orthopaedic Surgery

## 2022-05-14 MED ORDER — HYDROCODONE-ACETAMINOPHEN 5-325 MG PO TABS: 1.0000 | ORAL_TABLET | Freq: Four times a day (QID) | ORAL | 0 refills | Status: DC | PRN

## 2022-05-14 NOTE — Telephone Encounter (Signed)
Have to wean to hydrocodone instead of oxycodone.

## 2022-05-19 ENCOUNTER — Ambulatory Visit (INDEPENDENT_AMBULATORY_CARE_PROVIDER_SITE_OTHER): Payer: Medicare Other | Admitting: Orthopaedic Surgery

## 2022-05-19 ENCOUNTER — Encounter: Payer: Self-pay | Admitting: Orthopaedic Surgery

## 2022-05-19 DIAGNOSIS — Z96651 Presence of right artificial knee joint: Secondary | ICD-10-CM

## 2022-05-19 MED ORDER — HYDROCODONE-ACETAMINOPHEN 5-325 MG PO TABS: 1.0000 | ORAL_TABLET | Freq: Four times a day (QID) | ORAL | 0 refills | Status: DC | PRN

## 2022-05-19 NOTE — Progress Notes (Signed)
The patient is a 57 year old well-known to me.  She is now 10 weeks status post a right total knee arthroplasty.  On exam her extension is full and her flexion is full.  The knee feels limply stable.  There is swelling to be expected.  She says it is always worse in the morning when she gets up.  She has been pushing through with her mobility.  She is still taking hydrocodone and does need a refill.  She understands that we need her to wean at some point from narcotic pain medicine but I gave her praise that she is doing great and has pushed herself very hard.  From my standpoint the next time I need to see her is not for 3 months.  Will have an AP and lateral of her right knee at that visit.  All question concerns were answered and addressed.  I did refill her hydrocodone.

## 2022-05-21 ENCOUNTER — Ambulatory Visit
Admission: RE | Admit: 2022-05-21 | Discharge: 2022-05-21 | Disposition: A | Discharge status: Home / Self Care | Payer: Medicare Other | Source: Ambulatory Visit | Attending: Neurology | Admitting: Neurology

## 2022-05-21 DIAGNOSIS — R269 Unspecified abnormalities of gait and mobility: Secondary | ICD-10-CM

## 2022-05-21 DIAGNOSIS — R32 Unspecified urinary incontinence: Secondary | ICD-10-CM | POA: Diagnosis not present

## 2022-05-21 DIAGNOSIS — M5412 Radiculopathy, cervical region: Secondary | ICD-10-CM | POA: Diagnosis not present

## 2022-05-22 ENCOUNTER — Encounter: Payer: Self-pay | Admitting: Radiology

## 2022-05-22 ENCOUNTER — Other Ambulatory Visit: Payer: Self-pay | Admitting: Family Medicine

## 2022-05-22 ENCOUNTER — Other Ambulatory Visit: Payer: Self-pay | Admitting: Orthopaedic Surgery

## 2022-05-22 MED ORDER — HYDROCODONE-ACETAMINOPHEN 5-325 MG PO TABS: 1.0000 | ORAL_TABLET | Freq: Four times a day (QID) | ORAL | 0 refills | Status: DC | PRN

## 2022-05-23 ENCOUNTER — Other Ambulatory Visit: Payer: Self-pay | Admitting: Family Medicine

## 2022-05-27 ENCOUNTER — Other Ambulatory Visit: Payer: Self-pay | Admitting: Orthopaedic Surgery

## 2022-05-28 ENCOUNTER — Other Ambulatory Visit: Payer: Self-pay | Admitting: Orthopaedic Surgery

## 2022-05-28 MED ORDER — HYDROCODONE-ACETAMINOPHEN 5-325 MG PO TABS: 1.0000 | ORAL_TABLET | Freq: Four times a day (QID) | ORAL | 0 refills | Status: DC | PRN

## 2022-05-30 ENCOUNTER — Ambulatory Visit: Payer: Medicare Other | Admitting: Orthopedic Surgery

## 2022-06-02 ENCOUNTER — Ambulatory Visit: Payer: Medicare Other | Admitting: Urology

## 2022-06-04 ENCOUNTER — Other Ambulatory Visit: Payer: Self-pay | Admitting: Orthopaedic Surgery

## 2022-06-04 ENCOUNTER — Encounter: Payer: Self-pay | Admitting: Urology

## 2022-06-04 ENCOUNTER — Ambulatory Visit: Payer: Medicare Other | Admitting: Orthopedic Surgery

## 2022-06-04 MED ORDER — HYDROCODONE-ACETAMINOPHEN 5-325 MG PO TABS: 1.0000 | ORAL_TABLET | Freq: Four times a day (QID) | ORAL | 0 refills | Status: DC | PRN

## 2022-06-09 ENCOUNTER — Other Ambulatory Visit: Payer: Self-pay | Admitting: Orthopaedic Surgery

## 2022-06-09 MED ORDER — HYDROCODONE-ACETAMINOPHEN 5-325 MG PO TABS: 1.0000 | ORAL_TABLET | Freq: Four times a day (QID) | ORAL | 0 refills | Status: DC | PRN

## 2022-06-09 NOTE — Progress Notes (Deleted)
Complete physical exam   Patient: Megan Jimenez   DOB: 06-03-1965   57 y.o. Female  MRN: [id number] Visit Date: 06/10/2022  Today's healthcare provider: Gwyneth Sprout, FNP   No chief complaint on file.  Subjective    Megan Jimenez is a 57 y.o. female who presents today for a complete physical exam.  She reports consuming a {diet types:17450} diet. {Exercise:19826} She generally feels {well/fairly well/poorly:18703}. She reports sleeping {well/fairly well/poorly:18703}. She {does/does not:200015} have additional problems to discuss today.  HPI  ***  Past Medical History:  Diagnosis Date   Anxiety    Arthritis    Asthma    Bipolar 1 disorder ([place])    COVID-19 03/29/2021   Family history of breast cancer    Family history of colon cancer    Family history of leukemia    Family history of lung cancer    Family history of ovarian cancer    Family history of throat cancer    Pre-diabetes    Past Surgical History:  Procedure Laterality Date   ANKLE FRACTURE SURGERY Right 1992   ANTERIOR CERVICAL DECOMP/DISCECTOMY FUSION N/A 04/23/2021   Procedure: ANTERIOR CERVICAL DISCECTOMIES AND FUSION C4-5, C5-6 AND C6-7 WITH PLATES, SCREWS, ALLOGRAFT ACDF GRAFT, LOCAL BONE GRAFT, VIVIGEN;  Surgeon: Jessy Oto, MD;  Location: [place];  Service: Orthopedics;  Laterality: N/A;   CARPAL TUNNEL RELEASE Left 04/23/2021   Procedure: LEFT OPEN CARPAL TUNNEL RELEASE;  Surgeon: Jessy Oto, MD;  Location: [place];  Service: Orthopedics;  Laterality: Left;   CHOLECYSTECTOMY     COLONOSCOPY WITH PROPOFOL N/A 07/09/2020   Procedure: COLONOSCOPY WITH PROPOFOL;  Surgeon: Jonathon Bellows, MD;  Location: [facility] ENDOSCOPY;  Service: Gastroenterology;  Laterality: N/A;   TOTAL KNEE ARTHROPLASTY Right 03/11/2022   Procedure: RIGHT TOTAL KNEE ARTHROPLASTY;  Surgeon: Mcarthur Rossetti, MD;  Location: [place];  Service: Orthopedics;  Laterality: Right;  Needs RNFA   Social History   Socioeconomic  History   Marital status: Married    Spouse name: Not on file   Number of children: 2   Years of education: College   Highest education level: Not on file  Occupational History    Comment: Full-time  Tobacco Use   Smoking status: Former    Packs/day: 0.50    Years: 25.00    Additional pack years: 0.00    Total pack years: 12.50    Types: Cigarettes    Quit date: 03/17/2004    Years since quitting: 18.2   Smokeless tobacco: Never  Vaping Use   Vaping Use: Some days   Substances: Flavoring  Substance and Sexual Activity   Alcohol use: Not Currently   Drug use: No   Sexual activity: Not on file  Other Topics Concern   Not on file  Social History Narrative   Lives at home with husband   Social Determinants of Health   Financial Resource Strain: Low Risk  (09/29/2017)   Overall Financial Resource Strain (CARDIA)    Difficulty of Paying Living Expenses: Not hard at all  Food Insecurity: No Food Insecurity (09/29/2017)   Hunger Vital Sign    Worried About Running Out of Food in the Last Year: Never true    Ran Out of Food in the Last Year: Never true  Transportation Needs: No Transportation Needs (09/29/2017)   PRAPARE - Hydrologist (Medical): No    Lack of Transportation (Non-Medical): No  Physical  Activity: Insufficiently Active (09/29/2017)   Exercise Vital Sign    Days of Exercise per Week: 2 days    Minutes of Exercise per Session: 20 min  Stress: No Stress Concern Present (09/29/2017)   [place]    Feeling of Stress : Only a little  Social Connections: Moderately Integrated (09/29/2017)   Social Connection and Isolation Panel [NHANES]    Frequency of Communication with Friends and Family: Three times a week    Frequency of Social Gatherings with Friends and Family: Twice a week    Attends Religious Services: 1 to 4 times per year    Active Member of [employer] or Organizations: No     Attends Archivist Meetings: Never    Marital Status: Married  Human resources officer Violence: Not At Risk (09/29/2017)   Humiliation, Afraid, Rape, and Kick questionnaire    Fear of Current or Ex-Partner: No    Emotionally Abused: No    Physically Abused: No    Sexually Abused: No   Family Status  Relation Name Status   Mother  Deceased at age 95   Father  Deceased at age 40       LEUKEMIA   Sister  Alive   Sister mat half sis [place]  Deceased   [place]  Deceased   Pat Aunt  Alive   Psychiatrist x3 Deceased   MGM  Deceased   MGF  Deceased   PGM  Deceased   PGF  Deceased   Cousin paternal Deceased   Cousin paternal Deceased   Family History  Problem Relation Age of Onset   COPD Mother    Hyperlipidemia Mother    Lung cancer Mother 79   Hyperlipidemia Father    Leukemia Father 32   COPD Father    Depression Sister        ANXIETY   Healthy Sister    Breast cancer Maternal Aunt        dx 36s, recurrence 15s   Throat cancer Maternal Uncle    Lung cancer Paternal Uncle    Colon cancer Maternal Grandmother    Ovarian cancer Cousin 46       paternal   Ovarian cancer Cousin 28   No Known Allergies  Patient Care Team: Gwyneth Sprout, FNP as PCP - General (Family Medicine)   Medications: Outpatient Medications Prior to Visit  Medication Sig   albuterol (PROVENTIL) (2.5 MG/3ML) 0.083% nebulizer solution Take 3 mLs (2.5 mg total) by nebulization every 6 (six) hours as needed for wheezing or shortness of breath.   albuterol (VENTOLIN HFA) 108 (90 Base) MCG/ACT inhaler TAKE 2 PUFFS BY MOUTH EVERY 4 HOURS AS NEEDED FOR COUGH OR WHEEZING   ALPRAZolam (XANAX) 1 MG tablet Take 1 mg by mouth 4 (four) times daily as needed for anxiety.   amoxicillin-clavulanate (AUGMENTIN) 875-125 MG tablet Take 1 tablet by mouth 2 (two) times daily.   Ascorbic Acid (VITAMIN C) 1000 MG tablet Take 1,000 mg by mouth daily.   azelastine (ASTELIN) 0.1 % nasal spray Place 1 spray  into both nostrils 2 (two) times daily. Use in each nostril as directed   Calcium Carb-Cholecalciferol (CALCIUM/VITAMIN D PO) Take 1 tablet by mouth daily.   CAPLYTA 42 MG CAPS Take 42 mg by mouth at bedtime.   [facility] ASPIRIN ADULT LOW DOSE 81 MG chewable tablet CHEW 1 TABLET (81 MG TOTAL) BY MOUTH 2 (TWO) TIMES DAILY.  DULoxetine (CYMBALTA) 60 MG capsule Take 1 capsule (60 mg total) by mouth daily.   Eszopiclone 3 MG TABS Take 1 tablet (3 mg total) by mouth at bedtime. (Patient taking differently: Take 3 mg by mouth at bedtime as needed (sleep).)   ferrous sulfate 325 (65 FE) MG tablet Take 325 mg by mouth daily with breakfast.   fluticasone furoate-vilanterol (BREO ELLIPTA) 200-25 MCG/ACT AEPB Inhale 1 puff into the lungs daily.   HYDROcodone-acetaminophen (NORCO/VICODIN) 5-325 MG tablet Take 1-2 tablets by mouth every 6 (six) hours as needed for moderate pain.   loratadine (CLARITIN) 10 MG tablet Take 10 mg by mouth daily.   meloxicam (MOBIC) 15 MG tablet TAKE 1 TABLET (15 MG TOTAL) BY MOUTH DAILY.   metFORMIN (GLUCOPHAGE-XR) 750 MG 24 hr tablet TAKE 1 TABLET (750 MG TOTAL) BY MOUTH 2 (TWO) TIMES DAILY AFTER A MEAL.   methocarbamol (ROBAXIN) 500 MG tablet TAKE 1 TABLET BY MOUTH EVERY 6 HOURS AS NEEDED FOR MUSCLE SPASMS.   mirabegron ER (MYRBETRIQ) 50 MG TB24 tablet Take 1 tablet (50 mg total) by mouth daily.   Multiple Vitamin (MULTIVITAMIN WITH MINERALS) TABS tablet Take 1 tablet by mouth daily.   Omega-3 Fatty Acids (FISH OIL PO) Take 1 capsule by mouth daily.   phentermine 37.5 MG capsule Take 1 capsule (37.5 mg total) by mouth every morning.   pregabalin (LYRICA) 100 MG capsule Take 1 capsule (100 mg total) by mouth 3 (three) times daily.   promethazine-dextromethorphan (PROMETHAZINE-DM) 6.25-15 MG/5ML syrup Take 5 mLs by mouth 4 (four) times daily as needed.   vitamin B-12 (CYANOCOBALAMIN) 500 MCG tablet Take 500 mcg by mouth daily.   Vitamin D, Ergocalciferol, (DRISDOL) 50000 units CAPS  capsule Take 50,000 Units by mouth every Friday.    No facility-administered medications prior to visit.    Review of Systems  {Labs  Heme  Chem  Endocrine  Serology  Results Review (optional):23779}  Objective    LMP 09/28/2018  {Show previous vital signs (optional):23777}   Physical Exam  ***  Last depression screening scores    12/30/2021    4:20 PM 11/01/2021    3:10 PM 06/21/2020    8:26 AM  PHQ 2/9 Scores  PHQ - 2 Score 2 2 5   PHQ- 9 Score 8 9 22    Last fall risk screening    12/30/2021    4:20 PM  Fall Risk   Falls in the past year? 1  Number falls in past yr: 0  Injury with Fall? 1  Risk for fall due to : History of fall(s)  Follow up Falls evaluation completed;Education provided;Falls prevention discussed   Last Audit-C alcohol use screening    12/30/2021    4:21 PM  Alcohol Use Disorder Test (AUDIT)  1. How often do you have a drink containing alcohol? 2  2. How many drinks containing alcohol do you have on a typical day when you are drinking? 1  3. How often do you have six or more drinks on one occasion? 0  AUDIT-C Score 3   A score of 3 or more in women, and 4 or more in men indicates increased risk for alcohol abuse, EXCEPT if all of the points are from question 1   No results found for any visits on 06/10/22.  Assessment & Plan    Routine Health Maintenance and Physical Exam  Exercise Activities and Dietary recommendations  Goals   None     Immunization History  Administered Date(s) Administered  Influenza,inj,Quad PF,6+ Mos 03/28/2018, 02/27/2019   Influenza-Unspecified 11/26/2020, 12/09/2021   Moderna Sars-Covid-2 Vaccination 11/28/2019, 12/26/2019   PNEUMOCOCCAL CONJUGATE-20 12/12/2020   Pneumococcal Polysaccharide-23 10/25/2015   Td 05/30/1997   Tdap 10/25/2015   Zoster Recombinat (Shingrix) 11/29/2020    Health Maintenance  Topic Date Due   Medicare Annual Wellness (AWV)  Never done   HIV Screening  Never done    Zoster Vaccines- Shingrix (2 of 2) 01/24/2021   PAP SMEAR-Modifier  04/26/2021   COVID-19 Vaccine (3 - 2023-24 season) 11/15/2021   MAMMOGRAM  12/25/2023   DTaP/Tdap/Td (3 - Td or Tdap) 10/24/2025   COLONOSCOPY (Pts 45-25yrs Insurance coverage will need to be confirmed)  07/10/2027   INFLUENZA VACCINE  Completed   Hepatitis C Screening  Completed   HPV VACCINES  Aged Out    Discussed health benefits of physical activity, and encouraged her to engage in regular exercise appropriate for her age and condition.  ***  No follow-ups on file.     {provider attestation***:1}   Gwyneth Sprout, [place] [phone] (phone) [phone] (fax)  [place]

## 2022-06-10 ENCOUNTER — Encounter: Payer: Medicare Other | Admitting: Family Medicine

## 2022-06-10 ENCOUNTER — Ambulatory Visit: Payer: Medicare Other | Admitting: Orthopaedic Surgery

## 2022-06-10 NOTE — Progress Notes (Deleted)
Established patient visit   Patient: Megan Jimenez   DOB: 1965/06/11   57 y.o. Female  MRN: [id number] Visit Date: 06/11/2022  Today's healthcare provider: Gwyneth Sprout, FNP   No chief complaint on file.  Subjective    HPI  ***  Medications: Outpatient Medications Prior to Visit  Medication Sig   albuterol (PROVENTIL) (2.5 MG/3ML) 0.083% nebulizer solution Take 3 mLs (2.5 mg total) by nebulization every 6 (six) hours as needed for wheezing or shortness of breath.   albuterol (VENTOLIN HFA) 108 (90 Base) MCG/ACT inhaler TAKE 2 PUFFS BY MOUTH EVERY 4 HOURS AS NEEDED FOR COUGH OR WHEEZING   ALPRAZolam (XANAX) 1 MG tablet Take 1 mg by mouth 4 (four) times daily as needed for anxiety.   amoxicillin-clavulanate (AUGMENTIN) 875-125 MG tablet Take 1 tablet by mouth 2 (two) times daily.   Ascorbic Acid (VITAMIN C) 1000 MG tablet Take 1,000 mg by mouth daily.   azelastine (ASTELIN) 0.1 % nasal spray Place 1 spray into both nostrils 2 (two) times daily. Use in each nostril as directed   Calcium Carb-Cholecalciferol (CALCIUM/VITAMIN D PO) Take 1 tablet by mouth daily.   CAPLYTA 42 MG CAPS Take 42 mg by mouth at bedtime.   [employer]S ASPIRIN ADULT LOW DOSE 81 MG chewable tablet CHEW 1 TABLET (81 MG TOTAL) BY MOUTH 2 (TWO) TIMES DAILY.   DULoxetine (CYMBALTA) 60 MG capsule Take 1 capsule (60 mg total) by mouth daily.   Eszopiclone 3 MG TABS Take 1 tablet (3 mg total) by mouth at bedtime. (Patient taking differently: Take 3 mg by mouth at bedtime as needed (sleep).)   ferrous sulfate 325 (65 FE) MG tablet Take 325 mg by mouth daily with breakfast.   fluticasone furoate-vilanterol (BREO ELLIPTA) 200-25 MCG/ACT AEPB Inhale 1 puff into the lungs daily.   HYDROcodone-acetaminophen (NORCO/VICODIN) 5-325 MG tablet Take 1-2 tablets by mouth every 6 (six) hours as needed for moderate pain.   loratadine (CLARITIN) 10 MG tablet Take 10 mg by mouth daily.   meloxicam (MOBIC) 15 MG tablet TAKE 1 TABLET (15  MG TOTAL) BY MOUTH DAILY.   metFORMIN (GLUCOPHAGE-XR) 750 MG 24 hr tablet TAKE 1 TABLET (750 MG TOTAL) BY MOUTH 2 (TWO) TIMES DAILY AFTER A MEAL.   methocarbamol (ROBAXIN) 500 MG tablet TAKE 1 TABLET BY MOUTH EVERY 6 HOURS AS NEEDED FOR MUSCLE SPASMS.   mirabegron ER (MYRBETRIQ) 50 MG TB24 tablet Take 1 tablet (50 mg total) by mouth daily.   Multiple Vitamin (MULTIVITAMIN WITH MINERALS) TABS tablet Take 1 tablet by mouth daily.   Omega-3 Fatty Acids (FISH OIL PO) Take 1 capsule by mouth daily.   phentermine 37.5 MG capsule Take 1 capsule (37.5 mg total) by mouth every morning.   pregabalin (LYRICA) 100 MG capsule Take 1 capsule (100 mg total) by mouth 3 (three) times daily.   promethazine-dextromethorphan (PROMETHAZINE-DM) 6.25-15 MG/5ML syrup Take 5 mLs by mouth 4 (four) times daily as needed.   vitamin B-12 (CYANOCOBALAMIN) 500 MCG tablet Take 500 mcg by mouth daily.   Vitamin D, Ergocalciferol, (DRISDOL) 50000 units CAPS capsule Take 50,000 Units by mouth every Friday.    No facility-administered medications prior to visit.    Review of Systems  {Labs  Heme  Chem  Endocrine  Serology  Results Review (optional):23779}   Objective    LMP 09/28/2018  {Show previous vital signs (optional):23777}  Physical Exam  ***  No results found for any visits on 06/11/22.  Assessment & Plan     ***  No follow-ups on file.      {provider attestation***:1}   Gwyneth Sprout, [place] [phone] (phone) [phone] (fax)  [place]

## 2022-06-11 ENCOUNTER — Encounter: Payer: Medicare Other | Admitting: Family Medicine

## 2022-06-13 ENCOUNTER — Other Ambulatory Visit: Payer: Self-pay | Admitting: Orthopaedic Surgery

## 2022-06-14 ENCOUNTER — Other Ambulatory Visit: Payer: Self-pay | Admitting: Orthopaedic Surgery

## 2022-06-16 MED ORDER — HYDROCODONE-ACETAMINOPHEN 5-325 MG PO TABS: 1.0000 | ORAL_TABLET | Freq: Four times a day (QID) | ORAL | 0 refills | Status: DC | PRN

## 2022-06-18 ENCOUNTER — Ambulatory Visit (INDEPENDENT_AMBULATORY_CARE_PROVIDER_SITE_OTHER): Payer: Medicare Other | Admitting: Family Medicine

## 2022-06-18 ENCOUNTER — Encounter: Payer: Self-pay | Admitting: Family Medicine

## 2022-06-18 VITALS — BP 127/75 | HR 75 | Temp 97.7°F | Resp 16 | Ht 67.0 in | Wt 204.0 lb

## 2022-06-18 DIAGNOSIS — I10 Essential (primary) hypertension: Secondary | ICD-10-CM

## 2022-06-18 DIAGNOSIS — Z0000 Encounter for general adult medical examination without abnormal findings: Secondary | ICD-10-CM | POA: Diagnosis not present

## 2022-06-18 DIAGNOSIS — N3946 Mixed incontinence: Secondary | ICD-10-CM

## 2022-06-18 MED ORDER — PHENTERMINE HCL 37.5 MG PO CAPS: 37.5000 mg | ORAL_CAPSULE | Freq: Every morning | ORAL | 0 refills | Status: DC

## 2022-06-18 NOTE — Assessment & Plan Note (Signed)
UTD on dental; due for vision UTD on PAP, mammo, colon Things to do to keep yourself healthy  - Exercise at least 30-45 minutes a day, 3-4 days a week.  - Eat a low-fat diet with lots of fruits and vegetables, up to 7-9 servings per day.  - Seatbelts can save your life. Wear them always.  - Smoke detectors on every level of your home, check batteries every year.  - Eye Doctor - have an eye exam every 1-2 years  - Safe sex - if you may be exposed to STDs, use a condom.  - Alcohol -  If you drink, do it moderately, less than 2 drinks per day.  - [place]. Choose someone to speak for you if you are not able.  - Depression is common in our stressful world.If you're feeling down or losing interest in things you normally enjoy, please come in for a visit.  - Violence - If anyone is threatening or hurting you, please call immediately.

## 2022-06-18 NOTE — Assessment & Plan Note (Signed)
Welcome to Medicare, EKG taken Confirms SR in 60s without ischemia. Normal EKG.

## 2022-06-18 NOTE — Assessment & Plan Note (Signed)
Chronic, improving OK for refill of phentermine to assist; continue 3 month follow up for HR, BP and weight measuring

## 2022-06-18 NOTE — Patient Instructions (Signed)
The CDC recommends two doses of Shingrix (the new shingles vaccine) separated by 2 to 6 months for adults age 58 years and older. I recommend checking with your insurance plan regarding coverage for this vaccine.    

## 2022-06-18 NOTE — Assessment & Plan Note (Signed)
Chronic, impairs every day Referral to urogyn to assist

## 2022-06-18 NOTE — Progress Notes (Signed)
I,Sulibeya S Dimas,acting as a Education administrator for Gwyneth Sprout, FNP.,have documented all relevant documentation on the behalf of Gwyneth Sprout, FNP,as directed by  Gwyneth Sprout, FNP while in the presence of Gwyneth Sprout, FNP.  Annual Wellness Visit  Patient: Megan Jimenez, Female    DOB: 1965-06-26, 57 y.o.   MRN: [id number] Visit Date: 06/18/2022  Today's Provider: Gwyneth Sprout, FNP  Re Introduced to nurse practitioner role and practice setting.  All questions answered.  Discussed provider/patient relationship and expectations.  Subjective    Megan Jimenez is a 57 y.o. female who presents today for her Annual Wellness Visit. She reports consuming a  high protein, fruits and vegetables  diet. Home exercise routine includes walking 15-30 minutes a day. She generally feels well. She reports sleeping well. She does not have additional problems to discuss today.   HPI  Medications: Outpatient Medications Prior to Visit  Medication Sig   albuterol (PROVENTIL) (2.5 MG/3ML) 0.083% nebulizer solution Take 3 mLs (2.5 mg total) by nebulization every 6 (six) hours as needed for wheezing or shortness of breath.   albuterol (VENTOLIN HFA) 108 (90 Base) MCG/ACT inhaler TAKE 2 PUFFS BY MOUTH EVERY 4 HOURS AS NEEDED FOR COUGH OR WHEEZING   ALPRAZolam (XANAX) 1 MG tablet Take 1 mg by mouth 4 (four) times daily as needed for anxiety.   Ascorbic Acid (VITAMIN C) 1000 MG tablet Take 1,000 mg by mouth daily.   azelastine (ASTELIN) 0.1 % nasal spray Place 1 spray into both nostrils 2 (two) times daily. Use in each nostril as directed   Calcium Carb-Cholecalciferol (CALCIUM/VITAMIN D PO) Take 1 tablet by mouth daily.   CAPLYTA 42 MG CAPS Take 42 mg by mouth at bedtime.   [facility] ASPIRIN ADULT LOW DOSE 81 MG chewable tablet CHEW 1 TABLET (81 MG TOTAL) BY MOUTH 2 (TWO) TIMES DAILY.   DULoxetine (CYMBALTA) 60 MG capsule Take 1 capsule (60 mg total) by mouth daily.   ferrous sulfate 325 (65 FE) MG tablet Take 325  mg by mouth daily with breakfast.   meloxicam (MOBIC) 15 MG tablet TAKE 1 TABLET (15 MG TOTAL) BY MOUTH DAILY.   metFORMIN (GLUCOPHAGE-XR) 750 MG 24 hr tablet TAKE 1 TABLET (750 MG TOTAL) BY MOUTH 2 (TWO) TIMES DAILY AFTER A MEAL.   Multiple Vitamin (MULTIVITAMIN WITH MINERALS) TABS tablet Take 1 tablet by mouth daily.   Omega-3 Fatty Acids (FISH OIL PO) Take 1 capsule by mouth daily.   pregabalin (LYRICA) 100 MG capsule Take 1 capsule (100 mg total) by mouth 3 (three) times daily.   vitamin B-12 (CYANOCOBALAMIN) 500 MCG tablet Take 500 mcg by mouth daily.   Vitamin D, Ergocalciferol, (DRISDOL) 50000 units CAPS capsule Take 50,000 Units by mouth every Friday.    [DISCONTINUED] HYDROcodone-acetaminophen (NORCO/VICODIN) 5-325 MG tablet Take 1-2 tablets by mouth every 6 (six) hours as needed for moderate pain.   [DISCONTINUED] HYDROcodone-acetaminophen (NORCO/VICODIN) 5-325 MG tablet Take 1-2 tablets by mouth every 6 (six) hours as needed for moderate pain.   [DISCONTINUED] amoxicillin-clavulanate (AUGMENTIN) 875-125 MG tablet Take 1 tablet by mouth 2 (two) times daily.   [DISCONTINUED] Eszopiclone 3 MG TABS Take 1 tablet (3 mg total) by mouth at bedtime. (Patient taking differently: Take 3 mg by mouth at bedtime as needed (sleep).)   [DISCONTINUED] fluticasone furoate-vilanterol (BREO ELLIPTA) 200-25 MCG/ACT AEPB Inhale 1 puff into the lungs daily.   [DISCONTINUED] loratadine (CLARITIN) 10 MG tablet Take 10 mg by mouth daily.   [  DISCONTINUED] methocarbamol (ROBAXIN) 500 MG tablet TAKE 1 TABLET BY MOUTH EVERY 6 HOURS AS NEEDED FOR MUSCLE SPASMS.   [DISCONTINUED] mirabegron ER (MYRBETRIQ) 50 MG TB24 tablet Take 1 tablet (50 mg total) by mouth daily.   [DISCONTINUED] phentermine 37.5 MG capsule Take 1 capsule (37.5 mg total) by mouth every morning.   [DISCONTINUED] promethazine-dextromethorphan (PROMETHAZINE-DM) 6.25-15 MG/5ML syrup Take 5 mLs by mouth 4 (four) times daily as needed.   No  facility-administered medications prior to visit.    No Known Allergies  Patient Care Team: Gwyneth Sprout, FNP as PCP - General (Family Medicine)  Review of Systems  All other systems reviewed and are negative.   Last CBC Lab Results  Component Value Date   WBC 13.4 (H) 03/12/2022   HGB 11.8 (L) 03/12/2022   HCT 36.6 03/12/2022   MCV 95.8 03/12/2022   MCH 30.9 03/12/2022   RDW 13.0 03/12/2022   PLT 240 [id number]   Last metabolic panel Lab Results  Component Value Date   GLUCOSE 130 (H) 03/12/2022   NA 136 03/12/2022   K 4.2 03/12/2022   CL 100 03/12/2022   CO2 27 03/12/2022   BUN 5 (L) 03/12/2022   CREATININE 0.52 03/12/2022   GFRNONAA >60 03/12/2022   CALCIUM 8.8 (L) 03/12/2022   PROT 5.9 (L) 03/03/2022   ALBUMIN 3.5 03/03/2022   LABGLOB 1.7 11/01/2021   AGRATIO 2.2 11/01/2021   BILITOT 0.5 03/03/2022   ALKPHOS 48 03/03/2022   AST 17 03/03/2022   ALT 23 03/03/2022   ANIONGAP 9 03/12/2022   Last lipids Lab Results  Component Value Date   CHOL 208 (H) 04/26/2020   HDL 67 04/26/2020   LDLCALC 117 (H) 04/26/2020   TRIG 140 04/26/2020   CHOLHDL 3.1 04/28/2018   Last hemoglobin A1c Lab Results  Component Value Date   HGBA1C 5.5 11/01/2021   Last thyroid functions Lab Results  Component Value Date   TSH 0.764 11/29/2020   Last vitamin D Lab Results  Component Value Date   VD25OH 41.3 11/29/2020   Last vitamin B12 and Folate Lab Results  Component Value Date   VITAMINB12 1,221 (H) 12/02/2021   FOLATE >24.0 12/02/2021        Objective    Vitals: BP 127/75 (BP Location: Right Arm, Patient Position: Sitting, Cuff Size: Large)   Pulse 75   Temp 97.7 F (36.5 C) (Temporal)   Resp 16   Ht 5\' 7"  (1.702 m)   Wt 204 lb (92.5 kg)   LMP 09/28/2018   SpO2 100%   BMI 31.95 kg/m   BP Readings from Last 3 Encounters:  06/18/22 127/75  05/02/22 131/86  04/30/22 (!) 161/95   Wt Readings from Last 3 Encounters:  06/18/22 204 lb (92.5 kg)   05/02/22 214 lb (97.1 kg)  04/30/22 214 lb (97.1 kg)   Physical Exam Constitutional:      Appearance: Normal appearance. She is obese.  HENT:     Head: Normocephalic and atraumatic.  Cardiovascular:     Rate and Rhythm: Normal rate.  Pulmonary:     Effort: Pulmonary effort is normal.  Musculoskeletal:        General: Normal range of motion.  Skin:    General: Skin is dry.  Neurological:     General: No focal deficit present.     Mental Status: She is alert and oriented to person, place, and time. Mental status is at baseline.  Psychiatric:  Mood and Affect: Mood normal.        Behavior: Behavior normal.        Thought Content: Thought content normal.        Judgment: Judgment normal.    Most recent functional status assessment:    06/18/2022    2:32 PM  In your present state of health, do you have any difficulty performing the following activities:  Hearing? 0  Vision? 0  Difficulty concentrating or making decisions? 0  Walking or climbing stairs? 0  Dressing or bathing? 0  Doing errands, shopping? 0   Most recent fall risk assessment:    06/18/2022    2:32 PM  Fall Risk   Falls in the past year? 0  Number falls in past yr: 0  Injury with Fall? 0  Risk for fall due to : No Fall Risks  Follow up Falls evaluation completed    Most recent depression screenings:    06/18/2022    2:32 PM 12/30/2021    4:20 PM  PHQ 2/9 Scores  PHQ - 2 Score 0 2  PHQ- 9 Score 0 8   Most recent cognitive screening:     No data to display         Most recent Audit-C alcohol use screening    06/18/2022    2:32 PM  Alcohol Use Disorder Test (AUDIT)  1. How often do you have a drink containing alcohol? 0  2. How many drinks containing alcohol do you have on a typical day when you are drinking? 0  3. How often do you have six or more drinks on one occasion? 0  AUDIT-C Score 0   A score of 3 or more in women, and 4 or more in men indicates increased risk for alcohol abuse,  EXCEPT if all of the points are from question 1   No results found for any visits on 06/18/22.  Assessment & Plan     Annual wellness visit done today including the all of the following: Reviewed patient's Family Medical History Reviewed and updated list of patient's medical providers Assessment of cognitive impairment was done Assessed patient's functional ability Established a written schedule for health screening [place] Completed and Reviewed  Exercise Activities and Dietary recommendations  Goals   None     Immunization History  Administered Date(s) Administered   Influenza,inj,Quad PF,6+ Mos 03/28/2018, 02/27/2019   Influenza-Unspecified 11/26/2020, 12/09/2021   Moderna Sars-Covid-2 Vaccination 11/28/2019, 12/26/2019   PNEUMOCOCCAL CONJUGATE-20 12/12/2020   Pneumococcal Polysaccharide-23 10/25/2015   Td 05/30/1997   Tdap 10/25/2015   Zoster Recombinat (Shingrix) 11/29/2020    Health Maintenance  Topic Date Due   HIV Screening  Never done   Zoster Vaccines- Shingrix (2 of 2) 01/24/2021   PAP SMEAR-Modifier  04/26/2021   COVID-19 Vaccine (3 - 2023-24 season) 11/15/2021   INFLUENZA VACCINE  10/16/2022   Medicare Annual Wellness (AWV)  06/18/2023   MAMMOGRAM  12/25/2023   DTaP/Tdap/Td (3 - Td or Tdap) 10/24/2025   COLONOSCOPY (Pts 45-20yrs Insurance coverage will need to be confirmed)  07/10/2027   Hepatitis C Screening  Completed   HPV VACCINES  Aged Out     Discussed health benefits of physical activity, and encouraged her to engage in regular exercise appropriate for her age and condition.    Problem List Items Addressed This Visit       Cardiovascular and Mediastinum   Primary hypertension    Welcome to Medicare, EKG taken Confirms  SR in 28s without ischemia. Normal EKG.        Other   Morbid obesity    Chronic, improving OK for refill of phentermine to assist; continue 3 month follow up for HR, BP and weight measuring        Relevant Medications   phentermine 37.5 MG capsule   Urinary incontinence    Chronic, impairs every day Referral to urogyn to assist      Relevant Orders   Ambulatory referral to Urogynecology   Welcome to Medicare preventive visit - Primary    UTD on dental; due for vision UTD on PAP, mammo, colon Things to do to keep yourself healthy  - Exercise at least 30-45 minutes a day, 3-4 days a week.  - Eat a low-fat diet with lots of fruits and vegetables, up to 7-9 servings per day.  - Seatbelts can save your life. Wear them always.  - Smoke detectors on every level of your home, check batteries every year.  - Eye Doctor - have an eye exam every 1-2 years  - Safe sex - if you may be exposed to STDs, use a condom.  - Alcohol -  If you drink, do it moderately, less than 2 drinks per day.  - [place]. Choose someone to speak for you if you are not able.  - Depression is common in our stressful world.If you're feeling down or losing interest in things you normally enjoy, please come in for a visit.  - Violence - If anyone is threatening or hurting you, please call immediately.       Return in about 3 months (around 09/17/2022) for weight mgmt.    Vonna Kotyk, FNP, have reviewed all documentation for this visit. The documentation on 06/18/22 for the exam, diagnosis, procedures, and orders are all accurate and complete.  Gwyneth Sprout, [place] [phone] (phone) [phone] (fax)  [place]

## 2022-06-22 ENCOUNTER — Other Ambulatory Visit: Payer: Self-pay | Admitting: Orthopaedic Surgery

## 2022-06-23 ENCOUNTER — Encounter: Payer: Self-pay | Admitting: Family Medicine

## 2022-06-30 ENCOUNTER — Encounter: Payer: Self-pay | Admitting: Family Medicine

## 2022-07-01 NOTE — Progress Notes (Unsigned)
I,J'ya E Kristeen Lantz,acting as a scribe for Jacky Kindle, FNP.,have documented all relevant documentation on the behalf of Jacky Kindle, FNP,as directed by  Jacky Kindle, FNP while in the presence of Jacky Kindle, FNP.  MyChart Video Visit  Virtual Visit via Video Note   This format is felt to be most appropriate for this patient at this time. Physical exam was limited by quality of the video and audio technology used for the visit.   Patient location: Home Provider location: [facility]   I discussed the limitations of evaluation and management by telemedicine and the availability of in person appointments. The patient expressed understanding and agreed to proceed.  Patient: Megan Jimenez   DOB: 07-Apr-1965   57 y.o. Female  MRN: [id number] Visit Date: 07/02/2022  Today's healthcare provider: Jacky Kindle, FNP   Subjective    HPI  Patient wanted to discuss the medical necessity of excess skin removal, she reports that it is causing her a lot of problems. Patient is aware that she is due for PAP.   Patient Medications: Outpatient Medications Prior to Visit  Medication Sig   albuterol (PROVENTIL) (2.5 MG/3ML) 0.083% nebulizer solution Take 3 mLs (2.5 mg total) by nebulization every 6 (six) hours as needed for wheezing or shortness of breath.   albuterol (VENTOLIN HFA) 108 (90 Base) MCG/ACT inhaler TAKE 2 PUFFS BY MOUTH EVERY 4 HOURS AS NEEDED FOR COUGH OR WHEEZING   ALPRAZolam (XANAX) 1 MG tablet Take 1 mg by mouth 4 (four) times daily as needed for anxiety.   Ascorbic Acid (VITAMIN C) 1000 MG tablet Take 1,000 mg by mouth daily.   azelastine (ASTELIN) 0.1 % nasal spray Place 1 spray into both nostrils 2 (two) times daily. Use in each nostril as directed   Calcium Carb-Cholecalciferol (CALCIUM/VITAMIN D PO) Take 1 tablet by mouth daily.   CAPLYTA 42 MG CAPS Take 42 mg by mouth at bedtime.   [employer]S ASPIRIN ADULT LOW DOSE 81 MG chewable tablet CHEW 1 TABLET (81 MG  TOTAL) BY MOUTH 2 (TWO) TIMES DAILY.   DULoxetine (CYMBALTA) 60 MG capsule Take 1 capsule (60 mg total) by mouth daily.   ferrous sulfate 325 (65 FE) MG tablet Take 325 mg by mouth daily with breakfast.   gabapentin (NEURONTIN) 600 MG tablet TAKE ONE TABLET BY MOUTH THREE TIMES A DAY AS NEEDED FOR BURNING PAIN   meloxicam (MOBIC) 15 MG tablet TAKE 1 TABLET (15 MG TOTAL) BY MOUTH DAILY.   metFORMIN (GLUCOPHAGE-XR) 750 MG 24 hr tablet TAKE 1 TABLET (750 MG TOTAL) BY MOUTH 2 (TWO) TIMES DAILY AFTER A MEAL.   Multiple Vitamin (MULTIVITAMIN WITH MINERALS) TABS tablet Take 1 tablet by mouth daily.   Omega-3 Fatty Acids (FISH OIL PO) Take 1 capsule by mouth daily.   phentermine 37.5 MG capsule Take 1 capsule (37.5 mg total) by mouth every morning.   pregabalin (LYRICA) 100 MG capsule Take 1 capsule (100 mg total) by mouth 3 (three) times daily.   vitamin B-12 (CYANOCOBALAMIN) 500 MCG tablet Take 500 mcg by mouth daily.   Vitamin D, Ergocalciferol, (DRISDOL) 50000 units CAPS capsule Take 50,000 Units by mouth every Friday.    No facility-administered medications prior to visit.    Review of Systems   Objective    LMP 09/28/2018   Physical Exam Constitutional:      Appearance: Normal appearance. She is obese.  Pulmonary:     Effort: Pulmonary effort is normal.  Abdominal:     Comments: Patient reports ongoing loose abdominal skin and frequent cleaning of abdominal/pannus rolls due to moisture and concern for irritation including possible fungal rash from moisture  Neurological:     Mental Status: She is alert.      Assessment & Plan     Problem List Items Addressed This Visit       Musculoskeletal and Integument   Dermatitis associated with moisture - Primary    Abdominal skin fold; cleans her self every time she uses the restroom due to ongoing moisture      Fungal skin infection    Concern for reddened, irritated skin with possible ulcerations from moisture and presence of  fungus/skin fold presence Use of OTC antifungal powders creams at current time         Other   Chronic pain syndrome    Ongoing back, neck, knee pain; believes that if she had less abdominal weight she would have better pain control      Loose skin    Has lost weight and now has ongoing skin concerns from weight loss to abdomen from weight loss Concern for infection risk of panniculitis and ongoing fungal concerns from constant skin moisture       Urinary incontinence    Likely affected by ongoing abdominal adiposity         No follow-ups on file.     I discussed the assessment and treatment plan with the patient. The patient was provided an opportunity to ask questions and all were answered. The patient agreed with the plan and demonstrated an understanding of the instructions.   The patient was advised to call back or seek an in-person evaluation if the symptoms worsen or if the condition fails to improve as anticipated.  I provided 15 minutes of face-to-face time during this encounter regarding skin concerns and chronic health.  Leilani Merl, FNP, have reviewed all documentation for this visit. The documentation on 07/02/22 for the exam, diagnosis, procedures, and orders are all accurate and complete.   Jacky Kindle, FNP [facility] [phone] (phone) [phone] (fax)  [facility]

## 2022-07-02 ENCOUNTER — Telehealth (INDEPENDENT_AMBULATORY_CARE_PROVIDER_SITE_OTHER): Payer: Medicare Other | Admitting: Family Medicine

## 2022-07-02 ENCOUNTER — Encounter: Payer: Self-pay | Admitting: Family Medicine

## 2022-07-02 DIAGNOSIS — R32 Unspecified urinary incontinence: Secondary | ICD-10-CM

## 2022-07-02 DIAGNOSIS — B369 Superficial mycosis, unspecified: Secondary | ICD-10-CM

## 2022-07-02 DIAGNOSIS — L987 Excessive and redundant skin and subcutaneous tissue: Secondary | ICD-10-CM

## 2022-07-02 DIAGNOSIS — G894 Chronic pain syndrome: Secondary | ICD-10-CM | POA: Diagnosis not present

## 2022-07-02 DIAGNOSIS — L308 Other specified dermatitis: Secondary | ICD-10-CM | POA: Diagnosis not present

## 2022-07-02 NOTE — Assessment & Plan Note (Signed)
Concern for reddened, irritated skin with possible ulcerations from moisture and presence of fungus/skin fold presence Use of OTC antifungal powders creams at current time

## 2022-07-02 NOTE — Assessment & Plan Note (Signed)
Has lost weight and now has ongoing skin concerns from weight loss to abdomen from weight loss Concern for infection risk of panniculitis and ongoing fungal concerns from constant skin moisture

## 2022-07-02 NOTE — Assessment & Plan Note (Signed)
Likely affected by ongoing abdominal adiposity

## 2022-07-02 NOTE — Assessment & Plan Note (Signed)
Ongoing back, neck, knee pain; believes that if she had less abdominal weight she would have better pain control

## 2022-07-02 NOTE — Assessment & Plan Note (Signed)
Abdominal skin fold; cleans her self every time she uses the restroom due to ongoing moisture

## 2022-07-03 ENCOUNTER — Ambulatory Visit: Payer: Medicare Other | Admitting: Orthopedic Surgery

## 2022-07-03 DIAGNOSIS — M5442 Lumbago with sciatica, left side: Secondary | ICD-10-CM | POA: Diagnosis not present

## 2022-07-03 DIAGNOSIS — G8929 Other chronic pain: Secondary | ICD-10-CM | POA: Diagnosis not present

## 2022-07-03 MED ORDER — MELOXICAM 7.5 MG PO TABS: 7.5000 mg | ORAL_TABLET | Freq: Every day | ORAL | 2 refills | Status: DC

## 2022-07-03 NOTE — Progress Notes (Signed)
Orthopedic Spine Surgery Office Note  Assessment: Patient is a 57 y.o. female with chronic low back pain with no radicular symptoms   Plan: -Patient has tried PT, activity modification, over the counter medications, muscle relaxers, narcotics, steroid injections, oral steroids  -Recommended weight loss, continued core strengthening, over-the-counter medications, Voltaren gel over the affected area -Explained why lumbar fusion for back pain can be problematic and lead to long-term consequences with repeat surgery -Offered pain management as an additional treatment option, but patient was not interested in that referral at this time -Patient should return to office on an as-needed basis   Patient expressed understanding of the plan and all questions were answered to the patient's satisfaction.   ___________________________________________________________________________  History: Patient is a 57 y.o. female who has been previously seen in the office for low back pain that radiated into the posterior aspect of her left leg.  She states the left leg pain is more periodic and is not nearly as severe as her low back pain.  The back pain waxes and wanes in terms of intensity.  She notices it if she is on her motorcycle or if she is particular active.  No pain radiating into the right lower extremity.  Denies paresthesias and numbness.  Previous treatments: PT, activity modification, over the counter medications, muscle relaxers, narcotics, steroid injections, oral steroids    Physical Exam:  General: no acute distress, appears stated age Neurologic: alert, answering questions appropriately, following commands Respiratory: unlabored breathing on room air, symmetric chest rise Psychiatric: appropriate affect, normal cadence to speech   MSK (spine):  -Strength exam      Left  Right EHL    5/5  5/5 TA    5/5  5/5 GSC    5/5  5/5 Knee extension  5/5  5/5 Hip flexion   5/5  5/5  -Sensory  exam    Sensation intact to light touch in L3-S1 nerve distributions of bilateral lower extremities  -Achilles DTR: 1/4 on the left, 1/4 on the right -Patellar tendon DTR: 1/4 on the left, 1/4 on the right  -Straight leg raise: negative bilaterally -Femoral nerve stretch test: negative bilaterally  -Clonus: no beats bilaterally  Imaging: XR of the lumbar spine from 02/12/2022 was previously independently reviewed and interpreted, showing disc height loss at L5/S1, L2/3, and L1/2. No evidence of instability. No fracture or dislocation.    Patient name: Megan Jimenez Patient MRN: [id number] Date of visit: 07/03/22

## 2022-07-06 ENCOUNTER — Encounter: Payer: Self-pay | Admitting: Orthopedic Surgery

## 2022-07-07 MED ORDER — DULOXETINE HCL 60 MG PO CPEP: 60.0000 mg | ORAL_CAPSULE | Freq: Every day | ORAL | 2 refills | Status: DC

## 2022-07-07 MED ORDER — GABAPENTIN 600 MG PO TABS: ORAL_TABLET | ORAL | 3 refills | Status: DC

## 2022-07-08 ENCOUNTER — Telehealth: Payer: Self-pay | Admitting: Orthopedic Surgery

## 2022-07-08 NOTE — Telephone Encounter (Signed)
I called and gave Trista Dr. Buel Ream DEA number

## 2022-07-08 NOTE — Telephone Encounter (Signed)
Tasia Catchings Retail buyer) called from [employer] about needed DEA number for script of Gabapentin. Please call Tasia Catchings at [phone].

## 2022-07-23 ENCOUNTER — Other Ambulatory Visit: Payer: Self-pay | Admitting: Family Medicine

## 2022-07-23 DIAGNOSIS — R739 Hyperglycemia, unspecified: Secondary | ICD-10-CM

## 2022-08-06 ENCOUNTER — Ambulatory Visit: Payer: Medicare Other | Admitting: Plastic Surgery

## 2022-08-06 ENCOUNTER — Encounter: Payer: Self-pay | Admitting: Plastic Surgery

## 2022-08-06 VITALS — BP 135/86 | HR 96 | Ht 69.0 in | Wt 202.0 lb

## 2022-08-06 DIAGNOSIS — R21 Rash and other nonspecific skin eruption: Secondary | ICD-10-CM

## 2022-08-06 DIAGNOSIS — L987 Excessive and redundant skin and subcutaneous tissue: Secondary | ICD-10-CM | POA: Diagnosis not present

## 2022-08-06 NOTE — Progress Notes (Signed)
Referring Provider Jacky Kindle, FNP [address] [place],  [place] [address]   CC:  Chief Complaint  Patient presents with   Advice Only      Megan Jimenez is an 57 y.o. female.  HPI: Megan Jimenez is a 57 year old female who presents today with complaints of excess skin on her anterior abdominal wall which she feels cannot years with her ability to perform normal hygiene including urinating.  Additionally she states that the excess skin causes sweating and rashes in the intertriginous regions.  She is requesting surgical removal of her pannus.  No Known Allergies  Outpatient Encounter Medications as of 08/06/2022  Medication Sig   albuterol (PROVENTIL) (2.5 MG/3ML) 0.083% nebulizer solution Take 3 mLs (2.5 mg total) by nebulization every 6 (six) hours as needed for wheezing or shortness of breath.   albuterol (VENTOLIN HFA) 108 (90 Base) MCG/ACT inhaler TAKE 2 PUFFS BY MOUTH EVERY 4 HOURS AS NEEDED FOR COUGH OR WHEEZING (Patient not taking: Reported on 07/02/2022)   ALPRAZolam (XANAX) 1 MG tablet Take 1 mg by mouth 4 (four) times daily as needed for anxiety.   Ascorbic Acid (VITAMIN C) 1000 MG tablet Take 1,000 mg by mouth daily.   azelastine (ASTELIN) 0.1 % nasal spray Place 1 spray into both nostrils 2 (two) times daily. Use in each nostril as directed   Calcium Carb-Cholecalciferol (CALCIUM/VITAMIN D PO) Take 1 tablet by mouth daily.   CAPLYTA 42 MG CAPS Take 42 mg by mouth at bedtime.   [facility] ASPIRIN ADULT LOW DOSE 81 MG chewable tablet CHEW 1 TABLET (81 MG TOTAL) BY MOUTH 2 (TWO) TIMES DAILY.   DULoxetine (CYMBALTA) 60 MG capsule Take 1 capsule (60 mg total) by mouth daily.   ferrous sulfate 325 (65 FE) MG tablet Take 325 mg by mouth daily with breakfast.   gabapentin (NEURONTIN) 600 MG tablet TAKE ONE TABLET BY MOUTH THREE TIMES A DAY AS NEEDED FOR BURNING PAIN   meloxicam (MOBIC) 7.5 MG tablet Take 1 tablet (7.5 mg total) by mouth daily.   metFORMIN (GLUCOPHAGE-XR) 750 MG  24 hr tablet TAKE 1 TABLET (750 MG TOTAL) BY MOUTH 2 (TWO) TIMES DAILY AFTER A MEAL.   Multiple Vitamin (MULTIVITAMIN WITH MINERALS) TABS tablet Take 1 tablet by mouth daily.   Omega-3 Fatty Acids (FISH OIL PO) Take 1 capsule by mouth daily.   phentermine 37.5 MG capsule Take 1 capsule (37.5 mg total) by mouth every morning.   vitamin B-12 (CYANOCOBALAMIN) 500 MCG tablet Take 500 mcg by mouth daily.   Vitamin D, Ergocalciferol, (DRISDOL) 50000 units CAPS capsule Take 50,000 Units by mouth every Friday.    No facility-administered encounter medications on file as of 08/06/2022.     Past Medical History:  Diagnosis Date   Anxiety    Arthritis    Asthma    Bipolar 1 disorder (HCC)    COVID-19 03/29/2021   Family history of breast cancer    Family history of colon cancer    Family history of leukemia    Family history of lung cancer    Family history of ovarian cancer    Family history of throat cancer    Pre-diabetes     Past Surgical History:  Procedure Laterality Date   ANKLE FRACTURE SURGERY Right 1992   ANTERIOR CERVICAL DECOMP/DISCECTOMY FUSION N/A 04/23/2021   Procedure: ANTERIOR CERVICAL DISCECTOMIES AND FUSION C4-5, C5-6 AND C6-7 WITH PLATES, SCREWS, ALLOGRAFT ACDF GRAFT, LOCAL BONE GRAFT, VIVIGEN;  Surgeon: Vira Browns  E, MD;  Location: [facility] OR;  Service: Orthopedics;  Laterality: N/A;   CARPAL TUNNEL RELEASE Left 04/23/2021   Procedure: LEFT OPEN CARPAL TUNNEL RELEASE;  Surgeon: Kerrin Champagne, MD;  Location: [facility] OR;  Service: Orthopedics;  Laterality: Left;   CHOLECYSTECTOMY     COLONOSCOPY WITH PROPOFOL N/A 07/09/2020   Procedure: COLONOSCOPY WITH PROPOFOL;  Surgeon: Wyline Mood, MD;  Location[facility] END[facility]COPY;  Service: Gastroenterology;  Laterality: N/A;   TOTAL KNEE ARTHROPLASTY Right 03/11/2022   Procedure: RIGHT TOTAL KNEE ARTHROPLASTY;  Surgeon: Kathryne Hitch, MD;  Location: [facility] OR;  Service: Orthopedics;  Laterality: Right;  Needs RNFA    Family History   Problem Relation Age of Onset   COPD Mother    Hyperlipidemia Mother    Lung cancer Mother 94   Hyperlipidemia Father    Leukemia Father 62   COPD Father    Depression Sister        ANXIETY   Healthy Sister    Breast cancer Maternal Aunt        dx 30s, recurrence 40s   Throat cancer Maternal Uncle    Lung cancer Paternal Uncle    Colon cancer Maternal Grandmother    Ovarian cancer Cousin 39       paternal   Ovarian cancer Cousin 33    Social History   Social History Narrative   Lives at home with husband     Review of Systems General: Denies fevers, chills, weight loss CV: Denies chest pain, shortness of breath, palpitations Pannus: Patient has excess skin and fat which she states interferes with her activities of daily living specifically toileting.  When she sits the pannus interferes with her ability to reach her genitalia.  Additionally she feels that the weight of the pannus is pressing on her bladder worsening her issues with incontinence.  She does endorse rashes in the intertriginous regions but this was treated with medications that she was given by her primary care physician  Physical Exam    08/06/2022    3:40 PM 06/18/2022    2:12 PM 05/02/2022   10:40 AM  Vitals with BMI  Height 5\' 9"  5\' 7"  5\' 10"   Weight 202 lbs 204 lbs 214 lbs  BMI 29.82 31.94 30.71  Systolic 135 127 161  Diastolic 86 75 86  Pulse 96 75 84    General:  No acute distress,  Alert and oriented, Non-Toxic, Normal speech and affect Pannus: The patient has a significant amount of anterior abdominal wall skin which hangs to the symphysis pubis.  When she is sitting the pannus does extend onto her thighs. Mammogram: October 2023 BI-RADS 1 Assessment/Plan Pannus: Patient is requesting surgical vision of the skin of the lower abdominal wall.  She would be a good candidate for a panniculectomy.  We did discuss the fact that this will not address any of the excess skin above her umbilicus.  We  discussed the procedure and the location of the incisions.  We discussed the use of drains postoperatively and the fact that they may remain for anywhere to 4 weeks.  We discussed the risks of bleeding, infection, and wound healing issues.  Will schedule surgery at her request.  All questions answered to her satisfaction.  Photographs obtained today with her consent.  Santiago Glad 08/06/2022, 4:40 PM

## 2022-08-12 ENCOUNTER — Ambulatory Visit: Payer: Medicare Other | Admitting: Orthopaedic Surgery

## 2022-08-12 ENCOUNTER — Encounter: Payer: Self-pay | Admitting: Orthopaedic Surgery

## 2022-08-14 ENCOUNTER — Telehealth: Payer: Self-pay

## 2022-08-14 NOTE — Telephone Encounter (Signed)
Received a refill request for pregabalin 100mg . I do not see that medication on her med list. Dr. Zannie Cove last note on 01/23/23 stated:Higher dose of Lyrica 100 mg 3 times daily   I am routing to [employer] to ask for guidance on what to do seeing has how the medication isn't listed but [facility] [place] is requesting refill. I also do not have access to check the registry.

## 2022-08-24 ENCOUNTER — Other Ambulatory Visit: Payer: Self-pay | Admitting: Family Medicine

## 2022-08-27 ENCOUNTER — Ambulatory Visit: Payer: Medicare Other | Admitting: Orthopaedic Surgery

## 2022-08-27 ENCOUNTER — Ambulatory Visit: Payer: Self-pay

## 2022-08-27 NOTE — Patient Outreach (Signed)
  Care Coordination   08/27/2022 Name: CHAYLEE EHRSAM MRN: [id number] DOB: 1965/07/04   Care Coordination Outreach Attempts:  An unsuccessful telephone outreach was attempted today to offer the patient information about available care coordination services.  Follow Up Plan:  Additional outreach attempts will be made to offer the patient care coordination information and services.   Encounter Outcome:  No Answer   Care Coordination Interventions:  No, not indicated    SIG Lysle Morales, BSW Social Worker [facility] Care Management  [phone]

## 2022-08-29 ENCOUNTER — Encounter: Payer: Self-pay | Admitting: *Deleted

## 2022-09-02 ENCOUNTER — Encounter: Payer: Self-pay | Admitting: Orthopedic Surgery

## 2022-09-02 DIAGNOSIS — M545 Low back pain, unspecified: Secondary | ICD-10-CM

## 2022-09-03 NOTE — Telephone Encounter (Signed)
This encounter was created in error - please disregard.

## 2022-09-08 ENCOUNTER — Ambulatory Visit: Payer: Medicare Other | Admitting: Orthopaedic Surgery

## 2022-09-10 ENCOUNTER — Other Ambulatory Visit: Payer: Self-pay | Admitting: Family Medicine

## 2022-09-11 NOTE — Telephone Encounter (Signed)
Requested medication (s) are due for refill today: Yes  Requested medication (s) are on the active medication list: Yes  Last refill:  06/18/22 90 tabs 0 RF  Future visit scheduled: Yes  Notes to clinic:  Unable to refill per protocol, cannot delegate.      Requested Prescriptions  Pending Prescriptions Disp Refills   phentermine 37.5 MG capsule [Pharmacy Med Name: PHENTERMINE 37.5 MG CAPSULE] 90 capsule 0    Sig: TAKE ONE CAPSULE BY MOUTH EVERY MORNING     Not Delegated - Neurology: Anticonvulsants - Controlled - phentermine hydrochloride Failed - 09/10/2022  7:18 PM      Failed - This refill cannot be delegated      Passed - eGFR in normal range and within 360 days    EGFR (African American)  Date Value Ref Range Status  10/19/2013 >60  Final   GFR calc Af Amer  Date Value Ref Range Status  04/26/2020 119 >59 mL/min/1.73 Final    Comment:    **In accordance with recommendations from the NKF-ASN Task force,**   [facility] is in the process of updating its eGFR calculation to the   2021 CKD-EPI creatinine equation that estimates kidney function   without a race variable.    EGFR (Non-African Amer.)  Date Value Ref Range Status  10/19/2013 >60  Final    Comment:    eGFR values <75mL/min/1.73 m2 may be an indication of chronic kidney disease (CKD). Calculated eGFR is useful in patients with stable renal function. The eGFR calculation will not be reliable in acutely ill patients when serum creatinine is changing rapidly. It is not useful in  patients on dialysis. The eGFR calculation may not be applicable to patients at the low and high extremes of body sizes, pregnant women, and vegetarians.    GFR, Estimated  Date Value Ref Range Status  03/12/2022 >60 >60 mL/min Final    Comment:    (NOTE) Calculated using the CKD-EPI Creatinine Equation (2021)    eGFR  Date Value Ref Range Status  11/01/2021 105 >59 mL/min/1.73 Final         Passed - Cr in normal range and  within 360 days    Creatinine  Date Value Ref Range Status  10/19/2013 0.53 (L) 0.60 - 1.30 mg/dL Final   Creatinine, Ser  Date Value Ref Range Status  03/12/2022 0.52 0.44 - 1.00 mg/dL Final         Passed - Last BP in normal range    BP Readings from Last 1 Encounters:  08/06/22 135/86         Passed - Valid encounter within last 6 months    Recent Outpatient Visits           2 months ago Dermatitis associated with moisture   [facility] Jacky Kindle, FNP   2 months ago [employer] to [employer] preventive visit   [facility] Merita Norton T, FNP   8 months ago Moderate persistent asthma with acute exacerbation   [facility] Beryle Flock, Marzella Schlein, MD   10 months ago Primary hypertension   [facility] Merita Norton T, FNP   1 year ago OAB (overactive bladder)   [facility] Jacky Kindle, FNP       Future Appointments             In 1 week Jacky Kindle, FNP [facility]   In 2 weeks Kathryne Hitch, MD [facility]            Passed - Weight completed in the last 3 months    Wt Readings from Last 1 Encounters:  08/06/22 202 lb (91.6 kg)

## 2022-09-17 ENCOUNTER — Ambulatory Visit: Payer: Self-pay

## 2022-09-17 DIAGNOSIS — I119 Hypertensive heart disease without heart failure: Secondary | ICD-10-CM

## 2022-09-17 DIAGNOSIS — J45901 Unspecified asthma with (acute) exacerbation: Secondary | ICD-10-CM

## 2022-09-17 NOTE — Patient Outreach (Signed)
  Care Coordination   Initial Visit Note   09/17/2022 Name: Megan Jimenez MRN: [id number] DOB: 02-Jun-1965  Megan Jimenez is a 57 y.o. year old female who sees Jacky Kindle, FNP for primary care. I spoke with  Sandi Raveling by phone today.  What matters to the patients health and wellness today?  Patient wants [facility] services to improve medical and mental health.  Patient request staff contact her another day as she is in the community and not available to schedule at this time.    Goals Addressed             This Visit's Progress    Decrease cost of medication, improve blood sugar level and start therapy       Care Coordination Interventions: Patient wants to connect with Pharmacy services to address cost of medication   Patient wants to connect with LCSW to start therapy session and consider connecting to on-going therapy Patient wants help from the Nurse to better manage her blood sugar level         SDOH assessments and interventions completed:  No     Care Coordination Interventions:  Yes, provided   Interventions Today    Flowsheet Row Most Recent Value  General Interventions   General Interventions Discussed/Reviewed General Interventions Discussed, Referral to Nurse  Mental Health Interventions   Mental Health Discussed/Reviewed Refer to Social Work for counseling  Refer to Social Work for counseling regarding Anxiety/Coping, Depression  Pharmacy Interventions   Pharmacy Dicussed/Reviewed Referral to Pharmacist  Referral to Pharmacist Cannot afford medications       Follow up plan: Referral made to [facility] [employer] [facility], [facility]ity fundraiser, Pharmacy.    Encounter Outcome:  Pt. Visit Completed

## 2022-09-18 ENCOUNTER — Other Ambulatory Visit: Payer: Self-pay | Admitting: Family Medicine

## 2022-09-18 DIAGNOSIS — R739 Hyperglycemia, unspecified: Secondary | ICD-10-CM

## 2022-09-19 ENCOUNTER — Other Ambulatory Visit: Payer: Self-pay | Admitting: Family Medicine

## 2022-09-19 DIAGNOSIS — R739 Hyperglycemia, unspecified: Secondary | ICD-10-CM

## 2022-09-19 MED ORDER — PHENTERMINE HCL 37.5 MG PO CAPS: 37.5000 mg | ORAL_CAPSULE | Freq: Every morning | ORAL | 2 refills | Status: DC

## 2022-09-19 MED ORDER — METFORMIN HCL ER 750 MG PO TB24: 750.0000 mg | ORAL_TABLET | Freq: Two times a day (BID) | ORAL | 0 refills | Status: DC

## 2022-09-23 ENCOUNTER — Telehealth: Payer: Self-pay

## 2022-09-23 NOTE — Progress Notes (Signed)
   Care Guide Note  09/23/2022 Name: [name] MRN: [id number] DOB: Aug 17, 1965  Referred by: Jacky Kindle, FNP Reason for referral : Care Coordination (Outreach to schedule with Pharm d )   Megan Jimenez is a 57 y.o. year old female who is a primary care patient of Jacky Kindle, FNP. Sandi Raveling was referred to the pharmacist for assistance related to Anxiety.    Successful contact was made with the patient to discuss pharmacy services including being ready for the pharmacist to call at least 5 minutes before the scheduled appointment time, to have medication bottles and any blood sugar or blood pressure readings ready for review. The patient agreed to meet with the pharmacist via with the pharmacist via telephone visit on (date/time).  10/20/2022  Penne Lash, RMA Care Guide [facility]  [place], [place] [address] Direct Dial: [phone] [email or web address]

## 2022-09-24 ENCOUNTER — Ambulatory Visit: Payer: Medicare Other | Admitting: Family Medicine

## 2022-09-30 ENCOUNTER — Encounter: Payer: Self-pay | Admitting: Family Medicine

## 2022-09-30 ENCOUNTER — Ambulatory Visit (INDEPENDENT_AMBULATORY_CARE_PROVIDER_SITE_OTHER): Payer: Medicare Other | Admitting: Family Medicine

## 2022-09-30 VITALS — BP 126/93 | HR 91 | Ht 69.0 in | Wt 194.6 lb

## 2022-09-30 DIAGNOSIS — F319 Bipolar disorder, unspecified: Secondary | ICD-10-CM | POA: Insufficient documentation

## 2022-09-30 DIAGNOSIS — Z7689 Persons encountering health services in other specified circumstances: Secondary | ICD-10-CM | POA: Diagnosis not present

## 2022-09-30 DIAGNOSIS — F39 Unspecified mood [affective] disorder: Secondary | ICD-10-CM | POA: Diagnosis not present

## 2022-09-30 MED ORDER — DULOXETINE HCL 30 MG PO CPEP
ORAL_CAPSULE | ORAL | 0 refills | Status: DC
Start: 2022-09-30 — End: 2023-12-15

## 2022-09-30 MED ORDER — PHENTERMINE HCL 37.5 MG PO CAPS
37.5000 mg | ORAL_CAPSULE | Freq: Every morning | ORAL | 0 refills | Status: DC
Start: 2022-09-30 — End: 2023-02-16

## 2022-09-30 NOTE — Assessment & Plan Note (Signed)
Chronic, exacerbated Notes sister has passed and having a hard time since passing Increase cymbalta to assist from 60 daily to 60/30 with close f/u at 4 weeks

## 2022-09-30 NOTE — Progress Notes (Signed)
Established patient visit   Patient: Megan Jimenez   DOB: [birth year]   56 y.o. Female  MRN: [id number] Visit Date: 09/30/2022  Today's healthcare provider: Jacky Kindle, FNP  Introduced to nurse practitioner role and practice setting.  All questions answered.  Discussed provider/patient relationship and expectations.   Chief Complaint  Patient presents with   Weight Management Screening    Patient is present for weight mgmt f/u she was last seen 3 months ago and given refill of phentermine to assist. Patient reports    Subjective    HPI HPI     Weight Management Screening    Additional comments: Patient is present for weight mgmt f/u she was last seen 3 months ago and given refill of phentermine to assist. Patient reports       Last edited by Acey Lav, CMA on 09/30/2022  1:17 PM.      Medications: Outpatient Medications Prior to Visit  Medication Sig   ALPRAZolam (XANAX) 1 MG tablet Take 1 mg by mouth 4 (four) times daily as needed for anxiety.   Ascorbic Acid (VITAMIN C) 1000 MG tablet Take 1,000 mg by mouth daily.   Calcium Carb-Cholecalciferol (CALCIUM/VITAMIN D PO) Take 1 tablet by mouth daily.   CAPLYTA 42 MG CAPS Take 42 mg by mouth at bedtime.   DULoxetine (CYMBALTA) 60 MG capsule Take 1 capsule (60 mg total) by mouth daily.   ferrous sulfate 325 (65 FE) MG tablet Take 325 mg by mouth daily with breakfast.   meloxicam (MOBIC) 7.5 MG tablet Take 1 tablet (7.5 mg total) by mouth daily.   metFORMIN (GLUCOPHAGE-XR) 750 MG 24 hr tablet Take 1 tablet (750 mg total) by mouth 2 (two) times daily after a meal.   Multiple Vitamin (MULTIVITAMIN WITH MINERALS) TABS tablet Take 1 tablet by mouth daily.   Omega-3 Fatty Acids (FISH OIL PO) Take 1 capsule by mouth daily.   vitamin B-12 (CYANOCOBALAMIN) 500 MCG tablet Take 500 mcg by mouth daily.   Vitamin D, Ergocalciferol, (DRISDOL) 50000 units CAPS capsule Take 50,000 Units by mouth every Friday.     [DISCONTINUED] albuterol (PROVENTIL) (2.5 MG/3ML) 0.083% nebulizer solution Take 3 mLs (2.5 mg total) by nebulization every 6 (six) hours as needed for wheezing or shortness of breath.   [DISCONTINUED] gabapentin (NEURONTIN) 600 MG tablet TAKE ONE TABLET BY MOUTH THREE TIMES A DAY AS NEEDED FOR BURNING PAIN   [DISCONTINUED] phentermine 37.5 MG capsule Take 1 capsule (37.5 mg total) by mouth every morning.   No facility-administered medications prior to visit.    Review of Systems    Objective    BP (!) 137/90 (BP Location: Left Arm, Patient Position: Sitting, Cuff Size: Normal)   Pulse (!) 101   Ht 5\' 9"  (1.753 m)   Wt 194 lb 9.6 oz (88.3 kg)   LMP 09/28/2018   SpO2 100%   BMI 28.74 kg/m   Physical Exam Vitals and nursing note reviewed.  Constitutional:      General: She is not in acute distress.    Appearance: Normal appearance. She is overweight. She is not ill-appearing, toxic-appearing or diaphoretic.  HENT:     Head: Normocephalic and atraumatic.  Cardiovascular:     Rate and Rhythm: Regular rhythm. Tachycardia present.     Pulses: Normal pulses.     Heart sounds: Normal heart sounds. No murmur heard.    No friction rub. No gallop.  Pulmonary:     Effort: Pulmonary effort  is normal. No respiratory distress.     Breath sounds: Normal breath sounds. No stridor. No wheezing, rhonchi or rales.  Chest:     Chest wall: No tenderness.  Musculoskeletal:        General: No swelling, tenderness, deformity or signs of injury. Normal range of motion.     Right lower leg: No edema.     Left lower leg: No edema.  Skin:    General: Skin is warm and dry.     Capillary Refill: Capillary refill takes less than 2 seconds.     Coloration: Skin is not jaundiced or pale.     Findings: No bruising, erythema, lesion or rash.  Neurological:     General: No focal deficit present.     Mental Status: She is alert and oriented to person, place, and time. Mental status is at baseline.      Cranial Nerves: No cranial nerve deficit.     Sensory: No sensory deficit.     Motor: No weakness.     Coordination: Coordination normal.  Psychiatric:        Mood and Affect: Mood normal.        Behavior: Behavior normal.        Thought Content: Thought content normal.        Judgment: Judgment normal.     Comments: Tearfulness remembering sister who recently passed      No results found for any visits on 09/30/22.  Assessment & Plan     Problem List Items Addressed This Visit       Other   Encounter for weight management - Primary    Chronic, improved weight Body mass index is 28.74 kg/m. 8 lbs noted from previous encounter; no longer obese Working with Government social research officer for insurance auth for skin removal Tachycardia and mild htn noted today; however, situational stressors and traffic on way; denies anorexia or insomnia       Relevant Medications   phentermine 37.5 MG capsule   Mood disorder (HCC)    Chronic, exacerbated Notes sister has passed and having a hard time since passing Increase cymbalta to assist from 60 daily to 60/30 with close f/u at 4 weeks      Relevant Medications   DULoxetine (CYMBALTA) 30 MG capsule     Return in about 4 weeks (around 10/28/2022) for anxiety and depression- ok for VV.      Leilani Merl, FNP, have reviewed all documentation for this visit. The documentation on 09/30/22 for the exam, diagnosis, procedures, and orders are all accurate and complete.    Jacky Kindle, FNP  [facility] [phone] (phone) [phone] (fax)  [facility]

## 2022-09-30 NOTE — Assessment & Plan Note (Signed)
Chronic, improved weight Body mass index is 28.74 kg/m. 8 lbs noted from previous encounter; no longer obese Working with Government social research officer for insurance auth for skin removal Tachycardia and mild htn noted today; however, situational stressors and traffic on way; denies anorexia or insomnia

## 2022-10-01 ENCOUNTER — Other Ambulatory Visit: Payer: Self-pay

## 2022-10-01 ENCOUNTER — Ambulatory Visit: Payer: Medicare Other | Admitting: Orthopaedic Surgery

## 2022-10-01 ENCOUNTER — Encounter: Payer: Self-pay | Admitting: Orthopaedic Surgery

## 2022-10-01 ENCOUNTER — Other Ambulatory Visit (INDEPENDENT_AMBULATORY_CARE_PROVIDER_SITE_OTHER): Payer: Medicare Other

## 2022-10-01 ENCOUNTER — Telehealth: Payer: Self-pay | Admitting: *Deleted

## 2022-10-01 DIAGNOSIS — Z96651 Presence of right artificial knee joint: Secondary | ICD-10-CM

## 2022-10-01 NOTE — Telephone Encounter (Signed)
Seen in office for Ortho bundle 6 month meeting. No further CM needs.

## 2022-10-01 NOTE — Progress Notes (Signed)
The patient is now almost 7 months status post a right total knee replacement.  She is doing great from a knee standpoint.  She is being followed by my partner Dr. Christell Constant for her spine issues.  She is off the gabapentin and other pain medicines.  She does have some numbness and tingling in her right knee but she says the stability is great and there is really no pain with her right knee replacement.  This was performed with press-fit implants.  Examination of her right knee shows full flexion and full extension.  There is no effusion.  There is no significant swelling.  The knee is ligamentously stable.  The incision is healed.  There is lateral leg numbness which is definitely related to the surgery itself.  2 views of the right knee show well-seated press-fit total knee arthroplasty with no evidence of loosening and no complicating features.  I am pleased with the alignment.  At this point follow-up for her right knee can be as needed.  If she does develop any issues with that at all she needs to let us know immediately.  All questions and concerns were answered and addressed.

## 2022-10-02 DIAGNOSIS — R030 Elevated blood-pressure reading, without diagnosis of hypertension: Secondary | ICD-10-CM | POA: Diagnosis not present

## 2022-10-02 DIAGNOSIS — Z131 Encounter for screening for diabetes mellitus: Secondary | ICD-10-CM | POA: Diagnosis not present

## 2022-10-02 DIAGNOSIS — Z0130 Encounter for examination of blood pressure without abnormal findings: Secondary | ICD-10-CM | POA: Diagnosis not present

## 2022-10-02 DIAGNOSIS — Z9189 Other specified personal risk factors, not elsewhere classified: Secondary | ICD-10-CM | POA: Diagnosis not present

## 2022-10-02 DIAGNOSIS — Z1159 Encounter for screening for other viral diseases: Secondary | ICD-10-CM | POA: Diagnosis not present

## 2022-10-02 DIAGNOSIS — Z0000 Encounter for general adult medical examination without abnormal findings: Secondary | ICD-10-CM | POA: Diagnosis not present

## 2022-10-08 DIAGNOSIS — Z0130 Encounter for examination of blood pressure without abnormal findings: Secondary | ICD-10-CM | POA: Diagnosis not present

## 2022-10-08 DIAGNOSIS — Z0189 Encounter for other specified special examinations: Secondary | ICD-10-CM | POA: Diagnosis not present

## 2022-10-08 DIAGNOSIS — Z9189 Other specified personal risk factors, not elsewhere classified: Secondary | ICD-10-CM | POA: Diagnosis not present

## 2022-10-08 DIAGNOSIS — R030 Elevated blood-pressure reading, without diagnosis of hypertension: Secondary | ICD-10-CM | POA: Diagnosis not present

## 2022-10-10 ENCOUNTER — Other Ambulatory Visit: Payer: Self-pay | Admitting: Orthopedic Surgery

## 2022-10-16 ENCOUNTER — Telehealth: Payer: Self-pay

## 2022-10-16 NOTE — Telephone Encounter (Signed)
Copied from CRM [id number]. Topic: General - Other >> Oct 16, 2022  8:20 AM Phill Myron wrote: Patient may possibly have diabetes and she is not on a statin, per the current ADA guidelines and ACC AHA cholesterol guideline recommendations  NP Suzie Portela can you please consider re-evaluating therapy for this patient and prescribe a statin, if it is clinically appropriate and if the provider thinks it is appropriate. PLease consider sending a new prescription to the patients pharmacy as soon as possible So we can close this open care opportunity before the end of the year and if the member does not tolerate or meet exclusion criteria for statin use. Please document the appropriate ICD no. 10. Diagnosis code in their record.

## 2022-10-20 ENCOUNTER — Telehealth: Payer: Self-pay | Admitting: Pharmacist

## 2022-10-20 ENCOUNTER — Other Ambulatory Visit: Payer: Medicare Other | Admitting: Pharmacist

## 2022-10-20 ENCOUNTER — Other Ambulatory Visit: Payer: Self-pay | Admitting: Family Medicine

## 2022-10-20 DIAGNOSIS — R739 Hyperglycemia, unspecified: Secondary | ICD-10-CM

## 2022-10-20 NOTE — Progress Notes (Signed)
   Outreach Note  10/20/2022 Name: Megan Jimenez MRN: [id number] DOB: [birth year]  Referred by: Jacky Kindle, FNP  Was unable to reach patient via telephone today and have left HIPAA compliant voicemail asking patient to return my call.   Follow Up Plan: Will collaborate with Care Guide to outreach to schedule follow up with me  Estelle Grumbles, PharmD, [facility] Clinical Pharmacist [facility] [phone]

## 2022-10-21 NOTE — Telephone Encounter (Signed)
Requested Prescriptions  Pending Prescriptions Disp Refills   metFORMIN (GLUCOPHAGE-XR) 750 MG 24 hr tablet [Pharmacy Med Name: METFORMIN HCL ER 750 MG TABLET] 180 tablet 0    Sig: TAKE 1 TABLET (750 MG TOTAL) BY MOUTH 2 (TWO) TIMES DAILY AFTER A MEAL.     Endocrinology:  Diabetes - Biguanides Failed - 10/20/2022  2:40 AM      Failed - HBA1C is between 0 and 7.9 and within 180 days    Hgb A1c MFr Bld  Date Value Ref Range Status  11/01/2021 5.5 4.8 - 5.6 % Final    Comment:             Prediabetes: 5.7 - 6.4          Diabetes: >6.4          Glycemic control for adults with diabetes: <7.0          Failed - B12 Level in normal range and within 720 days    Vitamin B-12  Date Value Ref Range Status  12/02/2021 1,221 (H) 200 - 1,100 pg/mL Final         Failed - CBC within normal limits and completed in the last 12 months    WBC  Date Value Ref Range Status  03/12/2022 13.4 (H) 4.0 - 10.5 K/uL Final   RBC  Date Value Ref Range Status  03/12/2022 3.82 (L) 3.87 - 5.11 MIL/uL Final   Hemoglobin  Date Value Ref Range Status  03/12/2022 11.8 (L) 12.0 - 15.0 g/dL Final  65/78/4696 29.5 11.1 - 15.9 g/dL Final   HCT  Date Value Ref Range Status  03/12/2022 36.6 36.0 - 46.0 % Final   Hematocrit  Date Value Ref Range Status  11/29/2020 36.4 34.0 - 46.6 % Final   MCHC  Date Value Ref Range Status  03/12/2022 32.2 30.0 - 36.0 g/dL Final   [facility]  Date Value Ref Range Status  03/12/2022 30.9 26.0 - 34.0 pg Final   MCV  Date Value Ref Range Status  03/12/2022 95.8 80.0 - 100.0 fL Final  11/29/2020 91 79 - 97 fL Final  10/19/2013 92 80 - 100 fL Final   No results found for: "PLTCOUNTKUC", "LABPLAT", "POCPLA" RDW  Date Value Ref Range Status  03/12/2022 13.0 11.5 - 15.5 % Final  11/29/2020 11.9 11.7 - 15.4 % Final  10/19/2013 14.1 11.5 - 14.5 % Final         Passed - Cr in normal range and within 360 days    Creatinine  Date Value Ref Range Status  10/19/2013 0.53 (L)  0.60 - 1.30 mg/dL Final   Creatinine, Ser  Date Value Ref Range Status  03/12/2022 0.52 0.44 - 1.00 mg/dL Final         Passed - eGFR in normal range and within 360 days    EGFR (African American)  Date Value Ref Range Status  10/19/2013 >60  Final   GFR calc Af Amer  Date Value Ref Range Status  04/26/2020 119 >59 mL/min/1.73 Final    Comment:    **In accordance with recommendations from the NKF-ASN Task force,**   [facility] is in the process of updating its eGFR calculation to the   2021 CKD-EPI creatinine equation that estimates kidney function   without a race variable.    EGFR (Non-African Amer.)  Date Value Ref Range Status  10/19/2013 >60  Final    Comment:    eGFR values <64mL/min/1.73 m2 may be an indication of  chronic kidney disease (CKD). Calculated eGFR is useful in patients with stable renal function. The eGFR calculation will not be reliable in acutely ill patients when serum creatinine is changing rapidly. It is not useful in  patients on dialysis. The eGFR calculation may not be applicable to patients at the low and high extremes of body sizes, pregnant women, and vegetarians.    GFR, Estimated  Date Value Ref Range Status  03/12/2022 >60 >60 mL/min Final    Comment:    (NOTE) Calculated using the CKD-EPI Creatinine Equation (2021)    eGFR  Date Value Ref Range Status  11/01/2021 105 >59 mL/min/1.73 Final         Passed - Valid encounter within last 6 months    Recent Outpatient Visits           3 weeks ago Encounter for weight management   [facility] Merita Norton T, FNP   3 months ago Dermatitis associated with moisture   [facility] Jacky Kindle, FNP   4 months ago Welcome to [employer] preventive visit   [facility] Merita Norton T, FNP   9 months ago Moderate persistent asthma with acute exacerbation   [facility] [place], Marzella Schlein, MD   11 months ago Primary hypertension   [facility] Jacky Kindle, [place]

## 2022-10-22 ENCOUNTER — Encounter: Payer: Self-pay | Admitting: Plastic Surgery

## 2022-10-23 NOTE — Patient Instructions (Signed)
Visit Information  Thank you for taking time to visit with me today. Please don't hesitate to contact me if I can be of assistance to you.   Following are the goals we discussed today:   Goals Addressed             This Visit's Progress    Decrease cost of medication, improve blood sugar level and start therapy       Care Coordination Interventions: Patient wants to connect with Pharmacy services to address cost of medication   Patient wants to connect with LCSW to start therapy session and consider connecting to on-going therapy Patient wants help from the Nurse to better manage her blood sugar level         Please call the care guide team at [phone] if you need to cancel or reschedule your appointment.   If you are experiencing a Mental Health or Behavioral Health Crisis or need someone to talk to, please call the Suicide and Crisis Lifeline: 988 call the [place] National Suicide Prevention Lifeline: (952)317-8720 or TTY: 2175022088 TTY (678) 617-7078) to talk to a trained counselor call 1-800-273-TALK (toll free, 24 hour hotline) go to [facility] [address] [place] [phone]) call the [facility] Crisis Line: [phone] call 911  Patient verbalizes understanding of instructions and care plan provided today and agrees to view in MyChart. Active MyChart status and patient understanding of how to access instructions and care plan via MyChart confirmed with patient.     No further follow up required:    Lysle Morales, BSW Social Worker [facility] Care Management  [phone]

## 2022-10-30 DIAGNOSIS — Z79899 Other long term (current) drug therapy: Secondary | ICD-10-CM | POA: Diagnosis not present

## 2022-10-30 DIAGNOSIS — Z0130 Encounter for examination of blood pressure without abnormal findings: Secondary | ICD-10-CM | POA: Diagnosis not present

## 2022-10-30 DIAGNOSIS — M545 Low back pain, unspecified: Secondary | ICD-10-CM | POA: Diagnosis not present

## 2022-10-30 DIAGNOSIS — R030 Elevated blood-pressure reading, without diagnosis of hypertension: Secondary | ICD-10-CM | POA: Diagnosis not present

## 2022-11-03 ENCOUNTER — Encounter: Payer: Self-pay | Admitting: Orthopedic Surgery

## 2022-11-03 DIAGNOSIS — G8929 Other chronic pain: Secondary | ICD-10-CM

## 2022-11-04 NOTE — Addendum Note (Signed)
Addended by: Willia Craze on: 11/04/2022 03:17 PM   Modules accepted: Orders

## 2022-11-16 ENCOUNTER — Other Ambulatory Visit: Payer: Self-pay | Admitting: Physician Assistant

## 2022-11-16 ENCOUNTER — Other Ambulatory Visit: Payer: Self-pay | Admitting: Family Medicine

## 2022-11-16 ENCOUNTER — Other Ambulatory Visit: Payer: Self-pay | Admitting: Orthopaedic Surgery

## 2022-11-16 DIAGNOSIS — R739 Hyperglycemia, unspecified: Secondary | ICD-10-CM

## 2022-11-19 ENCOUNTER — Telehealth: Payer: Self-pay

## 2022-11-19 NOTE — Progress Notes (Signed)
  Care Coordination Note  11/19/2022 Name: [name] MRN: [id number] DOB: 30-Dec-1965  [name] is a 57 y.o. year old female who is a primary care patient of Jacky Kindle, FNP and is actively engaged with the Chronic Care Management team. I reached out to Sandi Raveling by phone today to assist with re-scheduling an initial visit with the Pharmacist  Follow up plan: Unsuccessful telephone outreach attempt made. A HIPAA compliant phone message was left for the patient providing contact information and requesting a return call.  If patient returns call to provider office, please advise to call CCM Care Guide Penne Lash  at [phone]  Penne Lash, RMA Care Guide [facility]  [place], [place] [address] Direct Dial: [phone] [email or web address]

## 2022-11-19 NOTE — Telephone Encounter (Signed)
Requested Prescriptions  Pending Prescriptions Disp Refills   metFORMIN (GLUCOPHAGE-XR) 750 MG 24 hr tablet [Pharmacy Med Name: METFORMIN HCL ER 750 MG TABLET] 180 tablet 0    Sig: TAKE 1 TABLET (750 MG TOTAL) BY MOUTH 2 (TWO) TIMES DAILY AFTER A MEAL.     Endocrinology:  Diabetes - Biguanides Failed - 11/16/2022 11:32 AM      Failed - HBA1C is between 0 and 7.9 and within 180 days    Hgb A1c MFr Bld  Date Value Ref Range Status  11/01/2021 5.5 4.8 - 5.6 % Final    Comment:             Prediabetes: 5.7 - 6.4          Diabetes: >6.4          Glycemic control for adults with diabetes: <7.0          Failed - B12 Level in normal range and within 720 days    Vitamin B-12  Date Value Ref Range Status  12/02/2021 1,221 (H) 200 - 1,100 pg/mL Final         Failed - CBC within normal limits and completed in the last 12 months    WBC  Date Value Ref Range Status  03/12/2022 13.4 (H) 4.0 - 10.5 K/uL Final   RBC  Date Value Ref Range Status  03/12/2022 3.82 (L) 3.87 - 5.11 MIL/uL Final   Hemoglobin  Date Value Ref Range Status  03/12/2022 11.8 (L) 12.0 - 15.0 g/dL Final  40/98/1191 47.8 11.1 - 15.9 g/dL Final   HCT  Date Value Ref Range Status  03/12/2022 36.6 36.0 - 46.0 % Final   Hematocrit  Date Value Ref Range Status  11/29/2020 36.4 34.0 - 46.6 % Final   MCHC  Date Value Ref Range Status  03/12/2022 32.2 30.0 - 36.0 g/dL Final   [facility]  Date Value Ref Range Status  03/12/2022 30.9 26.0 - 34.0 pg Final   MCV  Date Value Ref Range Status  03/12/2022 95.8 80.0 - 100.0 fL Final  11/29/2020 91 79 - 97 fL Final  10/19/2013 92 80 - 100 fL Final   No results found for: "PLTCOUNTKUC", "LABPLAT", "POCPLA" RDW  Date Value Ref Range Status  03/12/2022 13.0 11.5 - 15.5 % Final  11/29/2020 11.9 11.7 - 15.4 % Final  10/19/2013 14.1 11.5 - 14.5 % Final         Passed - Cr in normal range and within 360 days    Creatinine  Date Value Ref Range Status  10/19/2013 0.53 (L)  0.60 - 1.30 mg/dL Final   Creatinine, Ser  Date Value Ref Range Status  03/12/2022 0.52 0.44 - 1.00 mg/dL Final         Passed - eGFR in normal range and within 360 days    EGFR (African American)  Date Value Ref Range Status  10/19/2013 >60  Final   GFR calc Af Amer  Date Value Ref Range Status  04/26/2020 119 >59 mL/min/1.73 Final    Comment:    **In accordance with recommendations from the NKF-ASN Task force,**   [facility] is in the process of updating its eGFR calculation to the   2021 CKD-EPI creatinine equation that estimates kidney function   without a race variable.    EGFR (Non-African Amer.)  Date Value Ref Range Status  10/19/2013 >60  Final    Comment:    eGFR values <21mL/min/1.73 m2 may be an indication of chronic  kidney disease (CKD). Calculated eGFR is useful in patients with stable renal function. The eGFR calculation will not be reliable in acutely ill patients when serum creatinine is changing rapidly. It is not useful in  patients on dialysis. The eGFR calculation may not be applicable to patients at the low and high extremes of body sizes, pregnant women, and vegetarians.    GFR, Estimated  Date Value Ref Range Status  03/12/2022 >60 >60 mL/min Final    Comment:    (NOTE) Calculated using the CKD-EPI Creatinine Equation (2021)    eGFR  Date Value Ref Range Status  11/01/2021 105 >59 mL/min/1.73 Final         Passed - Valid encounter within last 6 months    Recent Outpatient Visits           1 month ago Encounter for weight management   [facility] Merita Norton T, FNP   4 months ago Dermatitis associated with moisture   [facility] Jacky Kindle, FNP   5 months ago Welcome to [employer] preventive visit   [facility] Merita Norton T, FNP   10 months ago Moderate persistent asthma with acute exacerbation   [facility] [place], Marzella Schlein, MD   1 year ago Primary hypertension   [facility] Jacky Kindle, FNP       Future Appointments             In 2 weeks Sherene Sires Charlaine Dalton, MD [facility]

## 2022-11-27 ENCOUNTER — Encounter: Payer: Self-pay | Admitting: Surgical

## 2022-11-27 ENCOUNTER — Ambulatory Visit (INDEPENDENT_AMBULATORY_CARE_PROVIDER_SITE_OTHER): Payer: Medicare Other | Admitting: Surgical

## 2022-11-27 DIAGNOSIS — L987 Excessive and redundant skin and subcutaneous tissue: Secondary | ICD-10-CM

## 2022-11-27 NOTE — Progress Notes (Signed)
No show

## 2022-12-03 NOTE — Progress Notes (Deleted)
Megan Jimenez, female    DOB: [birth year]   MRN: [id number]   Brief patient profile:  57  yowf   quit smoking 2006 wt at 220  born prematurely onset "asthma"  around 2000 and worse episodes p quit to point where needed freq inhalers  referred to pulmonary clinic in [facility]  05/16/2021 with daily need for inhalers since 2017 somewhat improved BREO 100 x 2022.     History of Present Illness  05/16/2021  Pulmonary/ 1st office eval/ Ameria Sanjurjo / [facility]  Chief Complaint  Patient presents with   pulmonary consult    Hx of asthma--recent course of prednisone and abx. C/o sob with exertion.    Dyspnea:  at best can do [employer]/ pushes cart / and mb flat 100 ft  Cough: throat clearing /slt yellowsih sometimes  Sleep: flat bed one pillow  SABA use: varies  with activity level Rec Plan A = Automatic = Always=    Symbicort 160 Take 2 puffs first thing in am and then another 2 puffs about 12 hours later.   Work on inhaler technique:    Plan B = Backup (to supplement plan A, not to replace it) Only use your albuterol inhaler   Ok to try albuterol 15 min before an activity Plan C = Crisis (instead of Plan B but only if Plan B stops working) - only use your albuterol nebulizer if you first try Plan B and it fails to help > ok to use the nebulizer up to every 4 hours but if start needing it regularly call for immediate appointment Please remember to go to the lab department   for your tests - we will call you with the results when they are available. 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  Feels chest is tight at time.  Says she feels it is allergy related.  Dyspnea:  [employer] shopping somewhat easier than on BREO Cough: not as much   Sleeping: able to lie flat with body pillow  SABA use: still using twice a day hfa./ no neb  02: none  Rec Plan A = Automatic = Always=    Try breztri Take 2 puffs first thing in am and then another 2 puffs about 12 hours later.  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   Dyspnea:  *** Cough: *** Sleeping: ***   resp cc  SABA use: *** 02: ***  Lung cancer screening: ***   No obvious day to  day or daytime variability or assoc excess/ purulent sputum or mucus plugs or hemoptysis or cp or chest tightness, subjective wheeze or overt sinus or hb symptoms.    Also denies any obvious fluctuation of symptoms with weather or environmental changes or other aggravating or alleviating factors except as outlined above   No unusual exposure hx or h/o childhood pna/ asthma or knowledge of premature birth.  Current Allergies, Complete Past Medical History, Past Surgical History, Family History, and Social History were reviewed in [employer] record.  ROS  The following are not active complaints unless bolded Hoarseness, sore throat, dysphagia, dental problems, itching, sneezing,  nasal congestion or discharge of excess mucus or purulent secretions, ear ache,   fever, chills, sweats, unintended wt loss or wt gain, classically pleuritic or exertional cp,  orthopnea pnd or arm/hand swelling   or leg swelling, presyncope, palpitations, abdominal pain, anorexia, nausea, vomiting, diarrhea  or change in bowel habits or change in bladder habits, change in stools or change in urine, dysuria, hematuria,  rash, arthralgias, visual complaints, headache, numbness, weakness or ataxia or problems with walking or coordination,  change in mood or  memory.        No outpatient medications have been marked as taking for the 12/04/22 encounter (Appointment) with Nyoka Cowden, MD.              Past Medical History:  Diagnosis Date   Asthma    Bipolar 1 disorder (HCC)    COVID-19 03/29/2021   Family history of breast cancer    Family history of colon cancer    Family history of leukemia    Family history of lung cancer    Family history of ovarian cancer    Family history of throat cancer        Objective:    wts   12/04/2022        ***   07/18/21 269 lb 6.4 oz (122.2 kg)  06/14/21 269 lb 8 oz (122.2 kg)  06/06/21 275 lb (124.7 kg)     Vital signs reviewed  12/04/2022  - Note at rest 02 sats  ***% on ***   General appearance:    ***    Min barr        Assessment

## 2022-12-04 ENCOUNTER — Ambulatory Visit: Payer: Medicare Other | Admitting: Internal Medicine

## 2022-12-05 ENCOUNTER — Ambulatory Visit (INDEPENDENT_AMBULATORY_CARE_PROVIDER_SITE_OTHER): Payer: Medicare Other | Admitting: Student

## 2022-12-05 VITALS — BP 115/74 | HR 98 | Ht 70.0 in | Wt 196.0 lb

## 2022-12-05 DIAGNOSIS — L987 Excessive and redundant skin and subcutaneous tissue: Secondary | ICD-10-CM

## 2022-12-05 MED ORDER — ONDANSETRON HCL 4 MG PO TABS: 4.0000 mg | ORAL_TABLET | Freq: Three times a day (TID) | ORAL | 0 refills | Status: DC | PRN

## 2022-12-05 MED ORDER — OXYCODONE HCL 5 MG PO TABS
5.0000 mg | ORAL_TABLET | Freq: Four times a day (QID) | ORAL | 0 refills | Status: DC | PRN
Start: 2022-12-05 — End: 2022-12-09
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
## 2022-12-09 ENCOUNTER — Other Ambulatory Visit: Payer: Self-pay | Admitting: Student

## 2022-12-09 MED ORDER — OXYCODONE HCL 5 MG PO TABS: 5.0000 mg | ORAL_TABLET | Freq: Four times a day (QID) | ORAL | 0 refills | Status: DC | PRN

## 2022-12-09 NOTE — Progress Notes (Signed)
[facility]  I called them and had them cancel the order.  Sending this order of oxycodone to [facility] for patient to pick up.

## 2022-12-10 NOTE — Progress Notes (Deleted)
Megan Jimenez, female    DOB: 1965-05-16   MRN: [id number]   Brief patient profile:  57  yowf   quit smoking 2006 wt at 220  born prematurely onset "asthma"  around 2000 and worse episodes p quit to point where needed freq inhalers  referred to pulmonary clinic in [facility]  05/16/2021 with daily need for inhalers since 2017 somewhat improved BREO 100 x 2022.     History of Present Illness  05/16/2021  Pulmonary/ 1st office eval/ Pinkney Venard / [facility]  Chief Complaint  Patient presents with   pulmonary consult    Hx of asthma--recent course of prednisone and abx. C/o sob with exertion.    Dyspnea:  at best can do [employer]/ pushes cart / and mb flat 100 ft  Cough: throat clearing /slt yellowsih sometimes  Sleep: flat bed one pillow  SABA use: varies  with activity level Rec Plan A = Automatic = Always=    Symbicort 160 Take 2 puffs first thing in am and then another 2 puffs about 12 hours later.   Work on inhaler technique:    Plan B = Backup (to supplement plan A, not to replace it) Only use your albuterol inhaler   Ok to try albuterol 15 min before an activity Plan C = Crisis (instead of Plan B but only if Plan B stops working) - only use your albuterol nebulizer if you first try Plan B and it fails to help > ok to use the nebulizer up to every 4 hours but if start needing it regularly call for immediate appointment Please remember to go to the lab department   for your tests - we will call you with the results when they are available. PFTs next available c/w GOLD 2 with atypical f/v loop and erv 3%     07/18/2021  f/u ov/Zayvon Alicea/ [facility] re: chronic asthma vs GOLD 2 copd /obesity   maint on singulairsymb 160 supoptimal techniqqe   Chief Complaint  Patient presents with   Follow-up    Have had to use rescue inhaler since PFT.  Feels chest is tight at time.  Says she feels it is allergy related.  Dyspnea:  [employer] shopping somewhat easier than on BREO Cough: not as much   Sleeping: able to lie flat with body pillow  SABA use: still using twice a day hfa./ no neb  02: none  Rec Plan A = Automatic = Always=    Try breztri Take 2 puffs first thing in am and then another 2 puffs about 12 hours later.  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   Dyspnea:  *** Cough: *** Sleeping: ***   resp cc  SABA use: *** 02: ***  Lung cancer screening: ***   No obvious day to  day or daytime variability or assoc excess/ purulent sputum or mucus plugs or hemoptysis or cp or chest tightness, subjective wheeze or overt sinus or hb symptoms.    Also denies any obvious fluctuation of symptoms with weather or environmental changes or other aggravating or alleviating factors except as outlined above   No unusual exposure hx or h/o childhood pna/ asthma or knowledge of premature birth.  Current Allergies, Complete Past Medical History, Past Surgical History, Family History, and Social History were reviewed in [employer] record.  ROS  The following are not active complaints unless bolded Hoarseness, sore throat, dysphagia, dental problems, itching, sneezing,  nasal congestion or discharge of excess mucus or purulent secretions, ear ache,   fever, chills, sweats, unintended wt loss or wt gain, classically pleuritic or exertional cp,  orthopnea pnd or arm/hand swelling   or leg swelling, presyncope, palpitations, abdominal pain, anorexia, nausea, vomiting, diarrhea  or change in bowel habits or change in bladder habits, change in stools or change in urine, dysuria, hematuria,  rash, arthralgias, visual complaints, headache, numbness, weakness or ataxia or problems with walking or coordination,  change in mood or  memory.        No outpatient medications have been marked as taking for the 12/11/22 encounter (Appointment) with Nyoka Cowden, MD.              Past Medical History:  Diagnosis Date   Asthma    Bipolar 1 disorder (HCC)    COVID-19 03/29/2021   Family history of breast cancer    Family history of colon cancer    Family history of leukemia    Family history of lung cancer    Family history of ovarian cancer    Family history of throat cancer        Objective:    wts   12/11/2022        ***   07/18/21 269 lb 6.4 oz (122.2 kg)  06/14/21 269 lb 8 oz (122.2 kg)  06/06/21 275 lb (124.7 kg)     Vital signs reviewed  12/11/2022  - Note at rest 02 sats  ***% on ***   General appearance:    ***    Min barr        Assessment

## 2022-12-11 ENCOUNTER — Ambulatory Visit: Payer: Medicare Other | Admitting: Internal Medicine

## 2022-12-19 ENCOUNTER — Telehealth: Payer: Self-pay | Admitting: *Deleted

## 2022-12-19 LAB — LAB REPORT - SCANNED

## 2022-12-19 NOTE — Telephone Encounter (Signed)
Patient states unaware that she had to pay the OR and Anes fees from her quote. States cannot pay prior to surgery. Changed surgery to only PAN at her request

## 2022-12-22 ENCOUNTER — Encounter: Payer: Self-pay | Admitting: Family Medicine

## 2022-12-22 DIAGNOSIS — M793 Panniculitis, unspecified: Secondary | ICD-10-CM | POA: Diagnosis not present

## 2022-12-24 ENCOUNTER — Other Ambulatory Visit: Payer: Medicare Other | Admitting: Pharmacist

## 2022-12-24 DIAGNOSIS — M545 Low back pain, unspecified: Secondary | ICD-10-CM

## 2022-12-24 NOTE — Addendum Note (Signed)
Addended by: Willia Craze on: 12/24/2022 03:35 PM   Modules accepted: Orders

## 2022-12-24 NOTE — Progress Notes (Signed)
12/24/2022 Name: Megan Jimenez MRN: [id number] DOB: 1965/12/25  Chief Complaint  Patient presents with   Medication Management    Megan Jimenez is a 57 y.o. year old female who presented for a telephone visit.   They were referred to the pharmacist by their PCP for assistance in managing medication access.    Subjective:  Care Team: Primary Care Provider: Jacky Kindle, FNP ; Next Scheduled Visit: None Clinical Pharmacist: Marlowe Aschoff, PharmD  Medication Access/Adherence  Current Pharmacy:  [facility] [address] - [place], [place] - [address] [facility] [address] [place] [address] Phone: [phone] Fax: [phone]  [facility] [address] [place] - [address] [place] [address] Phone: [phone] Fax: [phone]   Patient reports affordability concerns with their medications: No  Patient reports access/transportation concerns to their pharmacy: No  Patient reports adherence concerns with their medications:  No     Medication Management:  Supplements:Vitamin B-12, calcium/vitamin D, multivitamin, Vitamin D 5000 weekly, fish oil    Mood: Lurasidone 40mg , Duloxetine 60mg , Alprazolam 0.5mg  TID as needed  - Main concern for referral was Caplyta cost- confirmed that insurance coverage is $500 per month and patient does NOT qualify for coupon card due to using [facility] Medicare Advantage  - Will continue Latuda for now as she is doing okay so far, but preferred the Caplyta prior    Pre-diabetes/weight loss:Phentermine 37.5mg  (stopping), Metformin XR 750mg  twice a day  - Plans to stop Phentermine as her appetite has suppressed- will refill prescription if weight gain returns; hopefully this will lower HR and BP further  - If needed in the future, can maximize Metformin XR to 1000mg  twice a day; BG was 115 when last checked (advised goal is <100)     Pain: Tizanidine 4mg  (mostly helps with  sleep), meloxicam 15mg   *Has oxycodone 5mg  on-hand if needed due to recent surgery on 12/09/22; still has drains placed but has NOT used this medication   Asthma: Has not been using inhalers due to resolution of symptoms with weight loss and prioritizing her health; Claritin only; Montelukast stopped due to black-box warnings    Objective:  Lab Results  Component Value Date   HGBA1C 5.5 11/01/2021    Lab Results  Component Value Date   CREATININE 0.52 03/12/2022   BUN 5 (L) 03/12/2022   NA 136 03/12/2022   K 4.2 03/12/2022   CL 100 03/12/2022   CO2 27 03/12/2022    Lab Results  Component Value Date   CHOL 208 (H) 04/26/2020   HDL 67 04/26/2020   LDLCALC 117 (H) 04/26/2020   TRIG 140 04/26/2020   CHOLHDL 3.1 04/28/2018    Medications Reviewed Today     Reviewed by Pollie Friar, RPH (Pharmacist) on 12/24/22 at 1429  Med List Status: <None>   Medication Order Taking? Sig Documenting Provider Last Dose Status Informant  ALPRAZolam (XANAX) 0.5 MG tablet [id number] Yes Take 0.5 mg by mouth 3 (three) times daily as needed for anxiety. [provider] Taking Active   Ascorbic Acid (VITAMIN C) 1000 MG tablet [id number] Yes Take 1,000 mg by mouth daily. [provider] Taking Active Self  Calcium Carb-Cholecalciferol (CALCIUM/VITAMIN D PO) [id number] Yes Take 1 tablet by mouth daily. [provider] Taking Active Self  DULoxetine (CYMBALTA) 30 MG capsule [id number] Yes Take 2 capsules in am, take 1 capsule in pm. Follow up in 1-1.5 months Suzie Portela,  Daryl Eastern, FNP Taking Active   ferrous sulfate 325 (65 FE) MG tablet [id number] Yes Take 325 mg by mouth daily with breakfast. [provider] Taking Active Self  lurasidone (LATUDA) 40 MG TABS tablet [id number] Yes Take 40 mg by mouth daily. [provider] Taking Active   meloxicam (MOBIC) 15 MG tablet [id number] Yes TAKE 1 TABLET (15 MG TOTAL) BY MOUTH DAILY. Kathryne Hitch, MD Taking  Active   metFORMIN (GLUCOPHAGE-XR) 750 MG 24 hr tablet [id number] Yes TAKE 1 TABLET (750 MG TOTAL) BY MOUTH 2 (TWO) TIMES DAILY AFTER A MEAL. Jacky Kindle, FNP Taking Active   Multiple Vitamin (MULTIVITAMIN WITH MINERALS) TABS tablet [id number] Yes Take 1 tablet by mouth daily. [provider] Taking Active Self  Omega-3 Fatty Acids (FISH OIL PO) [id number] Yes Take 1 capsule by mouth daily. [provider] Taking Active Self  ondansetron (ZOFRAN) 4 MG tablet [id number] No Take 1 tablet (4 mg total) by mouth every 8 (eight) hours as needed for up to 20 doses for nausea or vomiting.  Patient not taking: Reported on 12/24/2022   Laurena Spies, PA-C Not Taking Active   oxyCODONE (ROXICODONE) 5 MG immediate release tablet [id number] No Take 1 tablet (5 mg total) by mouth every 6 (six) hours as needed for up to 20 doses for severe pain.  Patient not taking: Reported on 12/24/2022   Laurena Spies, PA-C Not Taking Active   phentermine 37.5 MG capsule [id number] No Take 1 capsule (37.5 mg total) by mouth every morning.  Patient not taking: Reported on 12/24/2022   Jacky Kindle, FNP Not Taking Active   tiZANidine (ZANAFLEX) 4 MG tablet [id number] Yes TAKE 1 TABLET BY MOUTH EVERY 6 HOURS AS NEEDED FOR MUSCLE SPASMS. Kathryne Hitch, MD Taking Active   vitamin B-12 (CYANOCOBALAMIN) 500 MCG tablet [id number] Yes Take 500 mcg by mouth daily. [provider] Taking Active Self  Vitamin D, Ergocalciferol, (DRISDOL) 50000 units CAPS capsule [id number] Yes Take 50,000 Units by mouth every Friday.  [provider] Taking Active Self           Med Note Daryel November   Tue Sep 29, 2017  2:10 AM)                Assessment/Plan:   Medication Management: - Currently strategy sufficient to maintain appropriate adherence to prescribed medication regimen - Appropriate changes made with medications and unable to assist with Caplyta cost    **Follow Up  Plan:**  - Follow-up call scheduled for 1 month on 01/23/23 to assess weight loss without phentermine use and how she is doing on the Latuda vs Caplyta prior - Send message to Dr. Christell Constant requesting new Pain Clinic referral as Dr. Welton Flakes will not take her as a patient  Marlowe Aschoff, PharmD [facility] Phone Number: [phone]

## 2022-12-31 ENCOUNTER — Encounter: Payer: Medicare Other | Admitting: Plastic Surgery

## 2023-01-01 ENCOUNTER — Ambulatory Visit (INDEPENDENT_AMBULATORY_CARE_PROVIDER_SITE_OTHER): Payer: Self-pay | Admitting: Plastic Surgery

## 2023-01-01 DIAGNOSIS — Z9889 Other specified postprocedural states: Secondary | ICD-10-CM

## 2023-01-01 NOTE — Progress Notes (Signed)
Megan Jimenez returns today approximately 9 days postop from a panniculectomy.  She reports that she had a fever of 103 as well as chills and fatigue.  She was worried that this might be related to the panniculectomy and began taking antibiotics.  She noted that there was erythema around the right drain site.  Today she seems to be doing well.  There is minimal erythema around the drain sites most consistent with irritation.  There is no erythema along the panniculectomy incision and the edges of the incision are clean dry and intact.  The right drain has been putting out 40 mL or less per day.  I removed it without difficulty.  The left drain will remain in place and the patient was instructed to call the office on Monday if the output remains 30 or 40 mL/day.  We will take it out at that time.  Follow-up sooner for any concerns or questions.

## 2023-01-05 ENCOUNTER — Encounter: Payer: Self-pay | Admitting: Surgical

## 2023-01-05 ENCOUNTER — Ambulatory Visit (INDEPENDENT_AMBULATORY_CARE_PROVIDER_SITE_OTHER): Payer: Self-pay | Admitting: Surgical

## 2023-01-05 DIAGNOSIS — L987 Excessive and redundant skin and subcutaneous tissue: Secondary | ICD-10-CM

## 2023-01-05 DIAGNOSIS — Z9889 Other specified postprocedural states: Secondary | ICD-10-CM

## 2023-01-05 NOTE — Progress Notes (Signed)
57 year old female here for follow-up after panniculectomy Dr. Ladona Ridgel on 12/22/2022.  She is 2 weeks postop.  Patient presents today for possible drain removal.  She reports she is overall doing really well, reports pain is well-controlled.  She reports JP drain output has been approximately 30 cc per 24 hours.  She reports she is having some irritation from the left JP drain insertion site.  She has continued to wear compressive garments.  Chaperone present on exam On exam abdominal incision is intact, left JP drain is in place with serosanguineous drainage in bulb.  There is some surrounding irritation, no cellulitic changes.  No tenderness noted.  No subcutaneous fluid collection noted with palpation.  A/P:  Recommend following up 1 to 2 weeks for reevaluation. Recommend calling with questions or concerns.  Continue to avoid strenuous activities or heavy lifting.  Continue with compressive garments 24/7.  There is no signs of infection or concern on exam.

## 2023-01-06 ENCOUNTER — Telehealth: Payer: Medicare Other | Admitting: Physician Assistant

## 2023-01-06 DIAGNOSIS — M79606 Pain in leg, unspecified: Secondary | ICD-10-CM

## 2023-01-06 NOTE — Progress Notes (Signed)
Because of your symptoms, I feel your condition warrants further evaluation and I recommend that you be seen in a face to face visit.   NOTE: There will be NO CHARGE for this eVisit   If you are having a true medical emergency please call 911.      For an urgent face to face visit, [facility] has eight urgent care centers for your convenience:   NEW!! [facility] Health Urgent Care Center at [facility] Get Driving Directions [phone] [address], [address] [place], [address]    [facility] Health Urgent Care Center at [facility] Get Driving Directions [phone] [address] [place], [place] [address]   [facility] Health Urgent Care Center [facility]) Get Driving Directions [phone] [address] [place], [place] [address]  [facility] Health Urgent Care Center [facility]) Get Driving Directions [phone] [address] [place],  [place]  [address]  [facility] Health Urgent Care Center [facility] - at [employer]  [phone] [address] [place],  [place] [address]   [facility] Health Urgent Care at [facility] Get Driving Directions [phone] [address], [address] [place], [place] [address]   [facility] Health Urgent Care at [facility] Get Driving Directions  [phone] [address] [place], [place] [address]   [facility] Health Urgent Care at [facility] Directions [phone] [address], [address] [place], [place] [address]  Your MyChart E-visit questionnaire answers were reviewed by a board certified advanced clinical practitioner to complete your personal care plan based on your specific symptoms.  Thank you for using e-Visits.

## 2023-01-13 ENCOUNTER — Emergency Department: Payer: Medicare Other

## 2023-01-13 ENCOUNTER — Emergency Department
Admission: EM | Admit: 2023-01-13 | Discharge: 2023-01-13 | Disposition: A | Discharge status: Other Acute Inpatient Hospital | Payer: Medicare Other | Attending: Emergency Medicine | Admitting: Emergency Medicine

## 2023-01-13 ENCOUNTER — Encounter (HOSPITAL_COMMUNITY): Payer: Self-pay

## 2023-01-13 ENCOUNTER — Inpatient Hospital Stay (HOSPITAL_COMMUNITY)
Admission: EM | Admit: 2023-01-13 | Discharge: 2023-01-17 | DRG: 856 | Disposition: A | Discharge status: Home / Self Care | Payer: Medicare Other | Source: Other Acute Inpatient Hospital | Attending: Plastic Surgery | Admitting: Plastic Surgery

## 2023-01-13 ENCOUNTER — Other Ambulatory Visit: Payer: Self-pay

## 2023-01-13 DIAGNOSIS — Z8616 Personal history of COVID-19: Secondary | ICD-10-CM

## 2023-01-13 DIAGNOSIS — Z96651 Presence of right artificial knee joint: Secondary | ICD-10-CM | POA: Diagnosis not present

## 2023-01-13 DIAGNOSIS — Z806 Family history of leukemia: Secondary | ICD-10-CM | POA: Diagnosis not present

## 2023-01-13 DIAGNOSIS — T8140XA Infection following a procedure, unspecified, initial encounter: Secondary | ICD-10-CM | POA: Diagnosis not present

## 2023-01-13 DIAGNOSIS — Z981 Arthrodesis status: Secondary | ICD-10-CM

## 2023-01-13 DIAGNOSIS — Z818 Family history of other mental and behavioral disorders: Secondary | ICD-10-CM | POA: Diagnosis not present

## 2023-01-13 DIAGNOSIS — Z803 Family history of malignant neoplasm of breast: Secondary | ICD-10-CM | POA: Diagnosis not present

## 2023-01-13 DIAGNOSIS — Z7984 Long term (current) use of oral hypoglycemic drugs: Secondary | ICD-10-CM

## 2023-01-13 DIAGNOSIS — M96842 Postprocedural seroma of a musculoskeletal structure following a musculoskeletal system procedure: Secondary | ICD-10-CM | POA: Diagnosis not present

## 2023-01-13 DIAGNOSIS — Z800 Family history of malignant neoplasm of digestive organs: Secondary | ICD-10-CM | POA: Diagnosis not present

## 2023-01-13 DIAGNOSIS — R0609 Other forms of dyspnea: Secondary | ICD-10-CM | POA: Diagnosis not present

## 2023-01-13 DIAGNOSIS — R7303 Prediabetes: Secondary | ICD-10-CM | POA: Diagnosis present

## 2023-01-13 DIAGNOSIS — Z83438 Family history of other disorder of lipoprotein metabolism and other lipidemia: Secondary | ICD-10-CM

## 2023-01-13 DIAGNOSIS — L03311 Cellulitis of abdominal wall: Secondary | ICD-10-CM | POA: Insufficient documentation

## 2023-01-13 DIAGNOSIS — L7634 Postprocedural seroma of skin and subcutaneous tissue following other procedure: Secondary | ICD-10-CM | POA: Diagnosis present

## 2023-01-13 DIAGNOSIS — R6510 Systemic inflammatory response syndrome (SIRS) of non-infectious origin without acute organ dysfunction: Secondary | ICD-10-CM

## 2023-01-13 DIAGNOSIS — Z9049 Acquired absence of other specified parts of digestive tract: Secondary | ICD-10-CM | POA: Diagnosis not present

## 2023-01-13 DIAGNOSIS — Z8041 Family history of malignant neoplasm of ovary: Secondary | ICD-10-CM

## 2023-01-13 DIAGNOSIS — B9561 Methicillin susceptible Staphylococcus aureus infection as the cause of diseases classified elsewhere: Secondary | ICD-10-CM | POA: Diagnosis not present

## 2023-01-13 DIAGNOSIS — K5900 Constipation, unspecified: Secondary | ICD-10-CM | POA: Diagnosis not present

## 2023-01-13 DIAGNOSIS — Z87891 Personal history of nicotine dependence: Secondary | ICD-10-CM | POA: Diagnosis not present

## 2023-01-13 DIAGNOSIS — D72829 Elevated white blood cell count, unspecified: Secondary | ICD-10-CM | POA: Insufficient documentation

## 2023-01-13 DIAGNOSIS — A419 Sepsis, unspecified organism: Secondary | ICD-10-CM | POA: Diagnosis present

## 2023-01-13 DIAGNOSIS — L7682 Other postprocedural complications of skin and subcutaneous tissue: Secondary | ICD-10-CM | POA: Diagnosis not present

## 2023-01-13 DIAGNOSIS — Z79899 Other long term (current) drug therapy: Secondary | ICD-10-CM

## 2023-01-13 DIAGNOSIS — R109 Unspecified abdominal pain: Secondary | ICD-10-CM | POA: Diagnosis not present

## 2023-01-13 DIAGNOSIS — F319 Bipolar disorder, unspecified: Secondary | ICD-10-CM | POA: Diagnosis present

## 2023-01-13 DIAGNOSIS — Y838 Other surgical procedures as the cause of abnormal reaction of the patient, or of later complication, without mention of misadventure at the time of the procedure: Secondary | ICD-10-CM | POA: Diagnosis present

## 2023-01-13 DIAGNOSIS — Z808 Family history of malignant neoplasm of other organs or systems: Secondary | ICD-10-CM

## 2023-01-13 DIAGNOSIS — F419 Anxiety disorder, unspecified: Secondary | ICD-10-CM | POA: Diagnosis present

## 2023-01-13 DIAGNOSIS — Z801 Family history of malignant neoplasm of trachea, bronchus and lung: Secondary | ICD-10-CM

## 2023-01-13 DIAGNOSIS — M1990 Unspecified osteoarthritis, unspecified site: Secondary | ICD-10-CM | POA: Diagnosis present

## 2023-01-13 DIAGNOSIS — L539 Erythematous condition, unspecified: Secondary | ICD-10-CM | POA: Diagnosis present

## 2023-01-13 DIAGNOSIS — L02211 Cutaneous abscess of abdominal wall: Secondary | ICD-10-CM | POA: Diagnosis not present

## 2023-01-13 DIAGNOSIS — J45901 Unspecified asthma with (acute) exacerbation: Secondary | ICD-10-CM | POA: Diagnosis not present

## 2023-01-13 DIAGNOSIS — Z825 Family history of asthma and other chronic lower respiratory diseases: Secondary | ICD-10-CM | POA: Diagnosis not present

## 2023-01-13 DIAGNOSIS — T8143XA Infection following a procedure, organ and space surgical site, initial encounter: Principal | ICD-10-CM | POA: Diagnosis present

## 2023-01-13 DIAGNOSIS — I10 Essential (primary) hypertension: Secondary | ICD-10-CM | POA: Diagnosis not present

## 2023-01-13 DIAGNOSIS — Z791 Long term (current) use of non-steroidal anti-inflammatories (NSAID): Secondary | ICD-10-CM

## 2023-01-13 DIAGNOSIS — R110 Nausea: Secondary | ICD-10-CM | POA: Diagnosis not present

## 2023-01-13 LAB — CBC
HCT: 34.3 % — ABNORMAL LOW (ref 36.0–46.0)
Hemoglobin: 11.4 g/dL — ABNORMAL LOW (ref 12.0–15.0)
MCH: 31.8 pg (ref 26.0–34.0)
MCHC: 33.2 g/dL (ref 30.0–36.0)
MCV: 95.8 fL (ref 80.0–100.0)
Platelets: 392 10*3/uL (ref 150–400)
RBC: 3.58 MIL/uL — ABNORMAL LOW (ref 3.87–5.11)
RDW: 12.1 % (ref 11.5–15.5)
WBC: 12.8 10*3/uL — ABNORMAL HIGH (ref 4.0–10.5)
nRBC: 0 % (ref 0.0–0.2)

## 2023-01-13 LAB — URINALYSIS, ROUTINE W REFLEX MICROSCOPIC
Bilirubin Urine: NEGATIVE
Glucose, UA: NEGATIVE mg/dL
Hgb urine dipstick: NEGATIVE
Ketones, ur: 5 mg/dL — AB
Leukocytes,Ua: NEGATIVE
Nitrite: NEGATIVE
Protein, ur: 30 mg/dL — AB
Specific Gravity, Urine: 1.035 — ABNORMAL HIGH (ref 1.005–1.030)
pH: 6 (ref 5.0–8.0)

## 2023-01-13 LAB — COMPREHENSIVE METABOLIC PANEL
ALT: 10 U/L (ref 0–44)
AST: 11 U/L — ABNORMAL LOW (ref 15–41)
Albumin: 3.3 g/dL — ABNORMAL LOW (ref 3.5–5.0)
Alkaline Phosphatase: 62 U/L (ref 38–126)
Anion gap: 10 (ref 5–15)
BUN: 10 mg/dL (ref 6–20)
CO2: 27 mmol/L (ref 22–32)
Calcium: 9.2 mg/dL (ref 8.9–10.3)
Chloride: 99 mmol/L (ref 98–111)
Creatinine, Ser: 0.55 mg/dL (ref 0.44–1.00)
GFR, Estimated: >60 mL/min (ref >60)
Glucose, Bld: 98 mg/dL (ref 70–99)
Potassium: 4.1 mmol/L (ref 3.5–5.1)
Sodium: 136 mmol/L (ref 135–145)
Total Bilirubin: 0.6 mg/dL (ref 0.3–1.2)
Total Protein: 6.8 g/dL (ref 6.5–8.1)

## 2023-01-13 LAB — LACTIC ACID, PLASMA: Lactic Acid, Venous: 0.7 mmol/L (ref 0.5–1.9)

## 2023-01-13 LAB — LIPASE, BLOOD: Lipase: 20 U/L (ref 11–51)

## 2023-01-13 MED ORDER — OXYCODONE HCL 5 MG PO TABS
5.0000 mg | ORAL_TABLET | Freq: Once | ORAL | Status: AC
  Administered 2023-01-13: 5 mg via ORAL
  Filled 2023-01-13: qty 1

## 2023-01-13 MED ORDER — VANCOMYCIN HCL IN DEXTROSE 1-5 GM/200ML-% IV SOLN: 1000.0000 mg | Freq: Once | INTRAVENOUS | Status: DC

## 2023-01-13 MED ORDER — HYDROMORPHONE HCL 1 MG/ML IJ SOLN
0.5000 mg | Freq: Once | INTRAMUSCULAR | Status: AC
  Administered 2023-01-13: 0.5 mg via INTRAVENOUS
  Filled 2023-01-13: qty 0.5

## 2023-01-13 MED ORDER — IOHEXOL 300 MG/ML  SOLN
100.0000 mL | Freq: Once | INTRAMUSCULAR | Status: AC | PRN
  Administered 2023-01-13: 100 mL via INTRAVENOUS

## 2023-01-13 MED ORDER — VANCOMYCIN HCL 1750 MG/350ML IV SOLN
1750.0000 mg | Freq: Once | INTRAVENOUS | Status: AC
  Administered 2023-01-13: 1750 mg via INTRAVENOUS
  Filled 2023-01-13: qty 350

## 2023-01-13 MED ORDER — SODIUM CHLORIDE 0.9 % IV BOLUS
1000.0000 mL | Freq: Once | INTRAVENOUS | Status: AC
  Administered 2023-01-13: 1000 mL via INTRAVENOUS

## 2023-01-13 MED ORDER — ONDANSETRON HCL 4 MG/2ML IJ SOLN
4.0000 mg | Freq: Once | INTRAMUSCULAR | Status: AC
  Administered 2023-01-13: 4 mg via INTRAVENOUS
  Filled 2023-01-13: qty 2

## 2023-01-13 MED ORDER — SODIUM CHLORIDE 0.9 % IV SOLN
2.0000 g | Freq: Once | INTRAVENOUS | Status: AC
  Administered 2023-01-13: 2 g via INTRAVENOUS
  Filled 2023-01-13: qty 12.5

## 2023-01-13 MED ORDER — METRONIDAZOLE 500 MG/100ML IV SOLN
500.0000 mg | Freq: Once | INTRAVENOUS | Status: AC
  Administered 2023-01-13: 500 mg via INTRAVENOUS
  Filled 2023-01-13: qty 100

## 2023-01-13 MED ORDER — FENTANYL CITRATE PF 50 MCG/ML IJ SOSY
50.0000 ug | PREFILLED_SYRINGE | Freq: Once | INTRAMUSCULAR | Status: AC
  Administered 2023-01-13: 50 ug via INTRAVENOUS
  Filled 2023-01-13: qty 1

## 2023-01-13 NOTE — ED Notes (Signed)
CT here for pt   

## 2023-01-13 NOTE — ED Notes (Signed)
Helped pt OOB to void on toilet.

## 2023-01-13 NOTE — Progress Notes (Signed)
Plan of Care Note for accepted transfer   Patient: Megan Jimenez MRN: [id number]   DOA: 01/13/2023  Facility requesting transfer: [facility] Requesting Provider: Dr. Fuller Plan Reason for transfer: Post op abscess Facility course: 57 yo F, had panniculecotmy 10/7 looks like has been following with [facility] plastic surg since then.  Now has intermittent fevers to 102, erythema, large fluid collection subcutaneous that's concerning for abscess on CT scan of abd today.  EDP d/w Dr. Ulice Bold: reportedly said to admit to hospitalist so plastic surgery could see in consult.   Plan of care: The patient is accepted for admission to Med-surg  unit, at [facility]..    Author: Hillary Bow., DO 01/13/2023  Check [email or web address] for on-call coverage.  Nursing staff, Please call [facility] Admits & Consults System-Wide number on Amion as soon as patient's arrival, so appropriate admitting provider can evaluate the pt.

## 2023-01-13 NOTE — Sepsis Progress Note (Signed)
Elink monitoring for the code sepsis protocol.  

## 2023-01-13 NOTE — ED Notes (Signed)
EDP at [facility] 

## 2023-01-13 NOTE — Consult Note (Signed)
CODE SEPSIS - PHARMACY COMMUNICATION  **Broad Spectrum Antibiotics should be administered within 1 hour of Sepsis diagnosis**  Time Code Sepsis Called/Page Received: 1542  Antibiotics Ordered: cefepime, vancomycin and Flagyl  Time of 1st antibiotic administration: 1607  Additional action taken by pharmacy: N/A  Barrie Folk ,PharmD Clinical Pharmacist  01/13/2023  3:46 PM

## 2023-01-13 NOTE — ED Provider Notes (Signed)
[facility] Provider Note    Event Date/Time   First MD Initiated Contact with Patient 01/13/23 1459     (approximate)   History   Post-op Problem and Abdominal Pain   HPI  Megan Jimenez is a 57 y.o. female who is status post panniculectomy with Dr. Ladona Ridgel who comes in with concerns for redness.  Patient reports the last few days having some intermittent fevers up to 102.  She also reports yesterday developing some redness of the skin.  She reports the pain is severe, constant.   Physical Exam   Triage Vital Signs: ED Triage Vitals  Encounter Vitals Group     BP 01/13/23 1148 (!) 141/92     Systolic BP Percentile --      Diastolic BP Percentile --      Pulse Rate 01/13/23 1148 (!) 103     Resp 01/13/23 1148 17     Temp 01/13/23 1148 98.6 F (37 C)     Temp Source 01/13/23 1543 Oral     SpO2 01/13/23 1148 99 %     Weight 01/13/23 1151 189 lb (85.7 kg)     Height 01/13/23 1151 5\' 10"  (1.778 m)     Head Circumference --      Peak Flow --      Pain Score 01/13/23 1148 8     Pain Loc --      Pain Education --      Exclude from Growth Chart --     Most recent vital signs: Vitals:   01/13/23 1545 01/13/23 1600  BP: (!) 147/99 135/69  Pulse: 93 95  Resp: 20   Temp:    SpO2: 100% 98%     General: Awake, no distress.  CV:  Good peripheral perfusion.  Resp:  Normal effort.  Abd:  No distention.  Other:   significant redness of her incisions on her lower abdomen going down to her mons pubis but not going into the labia.  She has a little bit of drainage noted out of one of the excisions on the left side.   ED Results / Procedures / Treatments   Labs (all labs ordered are listed, but only abnormal results are displayed) Labs Reviewed  COMPREHENSIVE METABOLIC PANEL - Abnormal; Notable for the following components:      Result Value   Albumin 3.3 (*)    AST 11 (*)    All other components within normal limits  CBC - Abnormal; Notable  for the following components:   WBC 12.8 (*)    RBC 3.58 (*)    Hemoglobin 11.4 (*)    HCT 34.3 (*)    All other components within normal limits  URINALYSIS, ROUTINE W REFLEX MICROSCOPIC - Abnormal; Notable for the following components:   Color, Urine AMBER (*)    APPearance HAZY (*)    Specific Gravity, Urine 1.035 (*)    Ketones, ur 5 (*)    Protein, ur 30 (*)    Bacteria, UA MANY (*)    All other components within normal limits  CULTURE, BLOOD (ROUTINE X 2)  CULTURE, BLOOD (ROUTINE X 2)  LIPASE, BLOOD  LACTIC ACID, PLASMA  LACTIC ACID, PLASMA     RADIOLOGY I have reviewed the CT personally and interpreted + abscess   PROCEDURES:  Critical Care performed: No  Procedures   MEDICATIONS ORDERED IN ED: Medications  ceFEPIme (MAXIPIME) 2 g in sodium chloride 0.9 % 100 mL IVPB (has no  administration in time range)  metroNIDAZOLE (FLAGYL) IVPB 500 mg (500 mg Intravenous New Bag/Given 01/13/23 1603)  vancomycin (VANCOREADY) IVPB 1750 mg/350 mL (1,750 mg Intravenous New Bag/Given 01/13/23 1601)  fentaNYL (SUBLIMAZE) injection 50 mcg (50 mcg Intravenous Given 01/13/23 1552)  ondansetron (ZOFRAN) injection 4 mg (4 mg Intravenous Given 01/13/23 1552)  sodium chloride 0.9 % bolus 1,000 mL (1,000 mLs Intravenous New Bag/Given 01/13/23 1600)     IMPRESSION / MDM / ASSESSMENT AND PLAN / ED COURSE  I reviewed the triage vital signs and the nursing notes.   Patient's presentation is most consistent with acute presentation with potential threat to life or bodily function.   Patient comes in with concern for postop cellulitis but I am also concerned about the possibility of deeper infection will get CT imaging to evaluate.  Sepsis alert was called given patient's white count and heart rate patient was given some fluids and broad-spectrum antibiotics were started while pending CT imaging.  Patient was given a dose of IV fentanyl IV Zofran to help with pain  Lactate is reassuring.   Lipase normal CMP reassuring.  CBC shows elevated white count 12.8  CT scan is consistent with an abscess based on my read.  Discussed with Dr. Aleen Campi and patient would require transfer to [facility] given plastic surgeon was at [facility].  Discussed with Dr Doreatha Massed general surgery.  She is going to get in contact with the plastic surgeon given this is their postop patient.  9:07 PM d/w Dr Laretta Alstrom will consult on once patient gets to the hospital but very would recommend admission to the hospitalist.  9:28 PM Accepted by hospitalist for admission.    The patient is on the cardiac monitor to evaluate for evidence of arrhythmia and/or significant heart rate changes.      FINAL CLINICAL IMPRESSION(S) / ED DIAGNOSES   Final diagnoses:  Sepsis, due to unspecified organism, unspecified whether acute organ dysfunction present (HCC)  Abdominal wall cellulitis  Postoperative surgical complication involving subcutaneous tissue associated with dermatologic procedure, unspecified complication     Rx / DC Orders   ED Discharge Orders     None        Note:  This document was prepared using Dragon voice recognition software and may include unintentional dictation errors.   Concha Se, MD 01/13/23 2128

## 2023-01-13 NOTE — ED Triage Notes (Signed)
Pt arrives via POV. Pt had a panniculectomy on the 7th of this month. She reports after having her drains removed she has been having severe pain. Reports fatigue, chills, fever, and body aches over the past couple of days. Pt is AxOx4. Afebrile during triage.

## 2023-01-13 NOTE — Consult Note (Signed)
PHARMACY -  BRIEF ANTIBIOTIC NOTE   Pharmacy has received consult(s) for vancomycin and cefepime dosing from an ED provider.  The patient's profile has been reviewed for ht/wt/allergies/indication/available labs.    One time order(s) placed for vancomycin 1750 mg IV x 1 and cefepime 2 grams IV x 1  Further antibiotics/pharmacy consults should be ordered by admitting physician if indicated.                       Thank you, Barrie Folk, PharmD 01/13/2023  3:45 PM

## 2023-01-13 NOTE — ED Notes (Signed)
..  EMTALA: REQUIRED DOCUMENTATION COMPLETED AND REVIEWED BY WRITER PRIOR TO PT TRANSFER MD REASSESSMENT EMTALA RN SECTION TRANSFER E-SIGN VS WITHIN REQUIRED TIME

## 2023-01-14 ENCOUNTER — Encounter (HOSPITAL_COMMUNITY)
Admission: EM | Disposition: A | Discharge status: Home / Self Care | Payer: Self-pay | Source: Other Acute Inpatient Hospital | Attending: Internal Medicine

## 2023-01-14 ENCOUNTER — Encounter (HOSPITAL_COMMUNITY): Payer: Self-pay | Admitting: Internal Medicine

## 2023-01-14 ENCOUNTER — Inpatient Hospital Stay (HOSPITAL_COMMUNITY): Payer: Medicare Other

## 2023-01-14 ENCOUNTER — Other Ambulatory Visit: Payer: Self-pay

## 2023-01-14 ENCOUNTER — Encounter: Payer: Medicare Other | Admitting: Surgical

## 2023-01-14 DIAGNOSIS — I10 Essential (primary) hypertension: Secondary | ICD-10-CM | POA: Diagnosis not present

## 2023-01-14 DIAGNOSIS — T8140XA Infection following a procedure, unspecified, initial encounter: Secondary | ICD-10-CM | POA: Diagnosis not present

## 2023-01-14 DIAGNOSIS — L02211 Cutaneous abscess of abdominal wall: Secondary | ICD-10-CM | POA: Diagnosis not present

## 2023-01-14 DIAGNOSIS — M96842 Postprocedural seroma of a musculoskeletal structure following a musculoskeletal system procedure: Secondary | ICD-10-CM

## 2023-01-14 DIAGNOSIS — R6510 Systemic inflammatory response syndrome (SIRS) of non-infectious origin without acute organ dysfunction: Secondary | ICD-10-CM

## 2023-01-14 HISTORY — PX: INCISION AND DRAINAGE OF WOUND: SHX1803

## 2023-01-14 LAB — LACTIC ACID, PLASMA: Lactic Acid, Venous: 0.7 mmol/L (ref 0.5–1.9)

## 2023-01-14 LAB — CBC
HCT: 31 % — ABNORMAL LOW (ref 36.0–46.0)
Hemoglobin: 10.1 g/dL — ABNORMAL LOW (ref 12.0–15.0)
MCH: 31.2 pg (ref 26.0–34.0)
MCHC: 32.6 g/dL (ref 30.0–36.0)
MCV: 95.7 fL (ref 80.0–100.0)
Platelets: 361 10*3/uL (ref 150–400)
RBC: 3.24 MIL/uL — ABNORMAL LOW (ref 3.87–5.11)
RDW: 12.3 % (ref 11.5–15.5)
WBC: 10.4 10*3/uL (ref 4.0–10.5)
nRBC: 0 % (ref 0.0–0.2)

## 2023-01-14 LAB — HEMOGLOBIN A1C
Hgb A1c MFr Bld: 5 % (ref 4.8–5.6)
Mean Plasma Glucose: 96.8 mg/dL

## 2023-01-14 LAB — HIV ANTIBODY (ROUTINE TESTING W REFLEX): HIV Screen 4th Generation wRfx: NONREACTIVE

## 2023-01-14 SURGERY — IRRIGATION AND DEBRIDEMENT WOUND
Anesthesia: General | Site: Abdomen

## 2023-01-14 MED ORDER — METRONIDAZOLE 500 MG/100ML IV SOLN
500.0000 mg | Freq: Once | INTRAVENOUS | Status: AC
  Administered 2023-01-15: 500 mg via INTRAVENOUS
  Filled 2023-01-14: qty 100

## 2023-01-14 MED ORDER — ACETAMINOPHEN 650 MG RE SUPP: 650.0000 mg | Freq: Four times a day (QID) | RECTAL | Status: DC | PRN

## 2023-01-14 MED ORDER — DEXAMETHASONE SODIUM PHOSPHATE 10 MG/ML IJ SOLN
INTRAMUSCULAR | Status: AC
Start: 2023-01-14 — End: ?
  Filled 2023-01-14: qty 1

## 2023-01-14 MED ORDER — SODIUM CHLORIDE 0.9 % IV SOLN
2.0000 g | Freq: Three times a day (TID) | INTRAVENOUS | Status: DC
  Administered 2023-01-14 – 2023-01-15 (×3): 2 g via INTRAVENOUS
  Filled 2023-01-14 (×3): qty 12.5

## 2023-01-14 MED ORDER — METRONIDAZOLE 500 MG PO TABS
500.0000 mg | ORAL_TABLET | Freq: Two times a day (BID) | ORAL | Status: DC
  Administered 2023-01-14 – 2023-01-15 (×2): 500 mg via ORAL
  Filled 2023-01-14 (×2): qty 1

## 2023-01-14 MED ORDER — KETOROLAC TROMETHAMINE 30 MG/ML IJ SOLN: INTRAMUSCULAR | Status: DC | PRN

## 2023-01-14 MED ORDER — ALPRAZOLAM 0.5 MG PO TABS
1.0000 mg | ORAL_TABLET | Freq: Three times a day (TID) | ORAL | Status: DC | PRN
  Administered 2023-01-15 – 2023-01-17 (×2): 1 mg via ORAL
  Filled 2023-01-14 (×2): qty 2

## 2023-01-14 MED ORDER — ACETAMINOPHEN 10 MG/ML IV SOLN
INTRAVENOUS | Status: DC | PRN
Start: 2023-01-14 — End: 2023-01-14
  Administered 2023-01-14: 1000 mg via INTRAVENOUS

## 2023-01-14 MED ORDER — AMISULPRIDE (ANTIEMETIC) 5 MG/2ML IV SOLN: 10.0000 mg | Freq: Once | INTRAVENOUS | Status: DC | PRN

## 2023-01-14 MED ORDER — ACETAMINOPHEN 325 MG PO TABS
650.0000 mg | ORAL_TABLET | Freq: Four times a day (QID) | ORAL | Status: DC | PRN
  Administered 2023-01-14: 650 mg via ORAL
  Filled 2023-01-14: qty 2

## 2023-01-14 MED ORDER — MIDAZOLAM HCL 2 MG/2ML IJ SOLN
INTRAMUSCULAR | Status: AC
  Filled 2023-01-14: qty 2

## 2023-01-14 MED ORDER — DULOXETINE HCL 60 MG PO CPEP
60.0000 mg | ORAL_CAPSULE | Freq: Every day | ORAL | Status: DC
  Administered 2023-01-14 – 2023-01-17 (×4): 60 mg via ORAL
  Filled 2023-01-14 (×5): qty 1

## 2023-01-14 MED ORDER — KETOROLAC TROMETHAMINE 30 MG/ML IJ SOLN
INTRAMUSCULAR | Status: DC | PRN
  Administered 2023-01-14: 30 mg via INTRAVENOUS

## 2023-01-14 MED ORDER — MIDAZOLAM HCL 2 MG/2ML IJ SOLN
INTRAMUSCULAR | Status: DC | PRN
  Administered 2023-01-14: 2 mg via INTRAVENOUS

## 2023-01-14 MED ORDER — VANCOMYCIN HCL 750 MG/150ML IV SOLN
750.0000 mg | Freq: Three times a day (TID) | INTRAVENOUS | Status: DC
  Administered 2023-01-14 – 2023-01-17 (×10): 750 mg via INTRAVENOUS
  Filled 2023-01-14 (×11): qty 150

## 2023-01-14 MED ORDER — HYDROMORPHONE HCL 1 MG/ML IJ SOLN
INTRAMUSCULAR | Status: AC
  Filled 2023-01-14: qty 0.5

## 2023-01-14 MED ORDER — HYDROMORPHONE HCL 1 MG/ML IJ SOLN
INTRAMUSCULAR | Status: DC | PRN
  Administered 2023-01-14: .5 mg via INTRAVENOUS

## 2023-01-14 MED ORDER — ROCURONIUM BROMIDE 10 MG/ML (PF) SYRINGE
PREFILLED_SYRINGE | INTRAVENOUS | Status: AC
  Filled 2023-01-14: qty 10

## 2023-01-14 MED ORDER — LIDOCAINE 2% (20 MG/ML) 5 ML SYRINGE
INTRAMUSCULAR | Status: DC | PRN
  Administered 2023-01-14: 60 mg via INTRAVENOUS

## 2023-01-14 MED ORDER — FENTANYL CITRATE (PF) 250 MCG/5ML IJ SOLN
INTRAMUSCULAR | Status: DC | PRN
  Administered 2023-01-14 (×2): 50 ug via INTRAVENOUS
  Administered 2023-01-14: 100 ug via INTRAVENOUS
  Administered 2023-01-14: 50 ug via INTRAVENOUS

## 2023-01-14 MED ORDER — MORPHINE SULFATE (PF) 2 MG/ML IV SOLN
1.0000 mg | Freq: Once | INTRAVENOUS | Status: AC | PRN
  Administered 2023-01-14: 1 mg via INTRAVENOUS
  Filled 2023-01-14: qty 1

## 2023-01-14 MED ORDER — LEVALBUTEROL HCL 0.63 MG/3ML IN NEBU: 0.6300 mg | INHALATION_SOLUTION | Freq: Three times a day (TID) | RESPIRATORY_TRACT | Status: DC | PRN

## 2023-01-14 MED ORDER — ACETAMINOPHEN 10 MG/ML IV SOLN
INTRAVENOUS | Status: AC
Start: 2023-01-14 — End: ?
  Filled 2023-01-14: qty 100

## 2023-01-14 MED ORDER — LIDOCAINE 2% (20 MG/ML) 5 ML SYRINGE
INTRAMUSCULAR | Status: AC
  Filled 2023-01-14: qty 5

## 2023-01-14 MED ORDER — CHLORHEXIDINE GLUCONATE 0.12 % MT SOLN
OROMUCOSAL | Status: AC
  Filled 2023-01-14: qty 15

## 2023-01-14 MED ORDER — FENTANYL CITRATE (PF) 100 MCG/2ML IJ SOLN: 25.0000 ug | INTRAMUSCULAR | Status: DC | PRN

## 2023-01-14 MED ORDER — ACETAMINOPHEN 325 MG PO TABS: 650.0000 mg | ORAL_TABLET | Freq: Four times a day (QID) | ORAL | Status: DC | PRN

## 2023-01-14 MED ORDER — SODIUM CHLORIDE 0.9 % IV SOLN
INTRAVENOUS | Status: AC
Start: 2023-01-14 — End: 2023-01-14

## 2023-01-14 MED ORDER — FENTANYL CITRATE (PF) 250 MCG/5ML IJ SOLN
INTRAMUSCULAR | Status: AC
  Filled 2023-01-14: qty 5

## 2023-01-14 MED ORDER — 0.9 % SODIUM CHLORIDE (POUR BTL) OPTIME
TOPICAL | Status: DC | PRN
  Administered 2023-01-14: 3000 mL

## 2023-01-14 MED ORDER — IPRATROPIUM BROMIDE 0.02 % IN SOLN
0.5000 mg | Freq: Three times a day (TID) | RESPIRATORY_TRACT | Status: DC
  Filled 2023-01-14: qty 2.5

## 2023-01-14 MED ORDER — METRONIDAZOLE 500 MG/100ML IV SOLN: 500.0000 mg | Freq: Two times a day (BID) | INTRAVENOUS | Status: DC

## 2023-01-14 MED ORDER — LIDOCAINE-EPINEPHRINE 1 %-1:100000 IJ SOLN
INTRAMUSCULAR | Status: AC
Start: 2023-01-14 — End: ?
  Filled 2023-01-14: qty 1

## 2023-01-14 MED ORDER — SUGAMMADEX SODIUM 200 MG/2ML IV SOLN
INTRAVENOUS | Status: DC | PRN
  Administered 2023-01-14: 200 mg via INTRAVENOUS

## 2023-01-14 MED ORDER — OXYCODONE HCL 5 MG PO TABS
5.0000 mg | ORAL_TABLET | Freq: Four times a day (QID) | ORAL | Status: AC | PRN
  Administered 2023-01-14: 5 mg via ORAL
  Filled 2023-01-14: qty 1

## 2023-01-14 MED ORDER — PROPOFOL 10 MG/ML IV BOLUS
INTRAVENOUS | Status: AC
  Filled 2023-01-14: qty 20

## 2023-01-14 MED ORDER — ROCURONIUM BROMIDE 10 MG/ML (PF) SYRINGE
PREFILLED_SYRINGE | INTRAVENOUS | Status: DC | PRN
  Administered 2023-01-14: 50 mg via INTRAVENOUS
  Administered 2023-01-14: 10 mg via INTRAVENOUS

## 2023-01-14 MED ORDER — ALBUTEROL SULFATE HFA 108 (90 BASE) MCG/ACT IN AERS
INHALATION_SPRAY | RESPIRATORY_TRACT | Status: DC | PRN
  Administered 2023-01-14 (×2): 6 via RESPIRATORY_TRACT

## 2023-01-14 MED ORDER — DEXAMETHASONE SODIUM PHOSPHATE 10 MG/ML IJ SOLN
INTRAMUSCULAR | Status: DC | PRN
  Administered 2023-01-14: 10 mg via INTRAVENOUS

## 2023-01-14 MED ORDER — MORPHINE SULFATE (PF) 2 MG/ML IV SOLN
1.0000 mg | INTRAVENOUS | Status: AC | PRN
  Administered 2023-01-14: 1 mg via INTRAVENOUS
  Filled 2023-01-14: qty 1

## 2023-01-14 MED ORDER — BACITRACIN ZINC 500 UNIT/GM EX OINT
TOPICAL_OINTMENT | CUTANEOUS | Status: AC
  Filled 2023-01-14: qty 28.35

## 2023-01-14 MED ORDER — KETOROLAC TROMETHAMINE 30 MG/ML IJ SOLN
INTRAMUSCULAR | Status: AC
  Filled 2023-01-14: qty 1

## 2023-01-14 MED ORDER — HEMOSTATIC AGENTS (NO CHARGE) OPTIME
TOPICAL | Status: DC | PRN
  Administered 2023-01-14: 1 via TOPICAL

## 2023-01-14 MED ORDER — CHLORHEXIDINE GLUCONATE 0.12 % MT SOLN: 15.0000 mL | Freq: Once | OROMUCOSAL | Status: AC

## 2023-01-14 MED ORDER — ALBUTEROL SULFATE HFA 108 (90 BASE) MCG/ACT IN AERS
INHALATION_SPRAY | RESPIRATORY_TRACT | Status: AC
  Filled 2023-01-14: qty 6.7

## 2023-01-14 MED ORDER — HYDROMORPHONE HCL 1 MG/ML IJ SOLN
0.5000 mg | INTRAMUSCULAR | Status: DC | PRN
  Administered 2023-01-15: 0.5 mg via INTRAVENOUS
  Filled 2023-01-14: qty 0.5

## 2023-01-14 MED ORDER — ACETAMINOPHEN 325 MG PO TABS
650.0000 mg | ORAL_TABLET | Freq: Four times a day (QID) | ORAL | Status: DC | PRN
  Administered 2023-01-15 – 2023-01-16 (×5): 650 mg via ORAL
  Filled 2023-01-14 (×5): qty 2

## 2023-01-14 MED ORDER — ONDANSETRON HCL 4 MG/2ML IJ SOLN
INTRAMUSCULAR | Status: AC
  Filled 2023-01-14: qty 2

## 2023-01-14 MED ORDER — OXYCODONE HCL 5 MG PO TABS
5.0000 mg | ORAL_TABLET | Freq: Four times a day (QID) | ORAL | Status: DC | PRN
  Administered 2023-01-15 – 2023-01-16 (×4): 5 mg via ORAL
  Filled 2023-01-14 (×4): qty 1

## 2023-01-14 MED ORDER — NALOXONE HCL 0.4 MG/ML IJ SOLN: 0.4000 mg | INTRAMUSCULAR | Status: DC | PRN

## 2023-01-14 MED ORDER — LEVALBUTEROL HCL 0.63 MG/3ML IN NEBU
0.6300 mg | INHALATION_SOLUTION | Freq: Three times a day (TID) | RESPIRATORY_TRACT | Status: DC
  Filled 2023-01-14: qty 3

## 2023-01-14 MED ORDER — PROPOFOL 10 MG/ML IV BOLUS
INTRAVENOUS | Status: DC | PRN
  Administered 2023-01-14: 150 mg via INTRAVENOUS
  Administered 2023-01-14: 50 mg via INTRAVENOUS

## 2023-01-14 MED ORDER — ORAL CARE MOUTH RINSE: 15.0000 mL | Freq: Once | OROMUCOSAL | Status: AC

## 2023-01-14 MED ORDER — ONDANSETRON HCL 4 MG/2ML IJ SOLN
INTRAMUSCULAR | Status: DC | PRN
  Administered 2023-01-14: 4 mg via INTRAVENOUS

## 2023-01-14 SURGICAL SUPPLY — 66 items
APL SKNCLS STERI-STRIP NONHPOA (GAUZE/BANDAGES/DRESSINGS) ×1
APL SWBSTK 6 STRL LF DISP (MISCELLANEOUS)
APPLICATOR COTTON TIP 6 STRL (MISCELLANEOUS) IMPLANT
APPLICATOR COTTON TIP 6IN STRL (MISCELLANEOUS) IMPLANT
BAG COUNTER SPONGE SURGICOUNT (BAG) ×1 IMPLANT
BAG DECANTER FOR FLEXI CONT (MISCELLANEOUS) IMPLANT
BAG SPNG CNTER NS LX DISP (BAG) ×1
BENZOIN TINCTURE PRP APPL 2/3 (GAUZE/BANDAGES/DRESSINGS) ×1 IMPLANT
BINDER ABDOMINAL 12 ML 46-62 (SOFTGOODS) IMPLANT
BIOPATCH RED 1 DISK 7.0 (GAUZE/BANDAGES/DRESSINGS) IMPLANT
CANISTER SUCT 3000ML PPV (MISCELLANEOUS) ×1 IMPLANT
CNTNR URN SCR LID CUP LEK RST (MISCELLANEOUS) IMPLANT
CONT SPEC 4OZ STRL OR WHT (MISCELLANEOUS)
COVER SURGICAL LIGHT HANDLE (MISCELLANEOUS) ×1 IMPLANT
DRAIN CHANNEL 19F RND (DRAIN) IMPLANT
DRAIN JP 10F RND SILICONE (MISCELLANEOUS) IMPLANT
DRAPE HALF SHEET 40X57 (DRAPES) IMPLANT
DRAPE INCISE IOBAN 66X45 STRL (DRAPES) IMPLANT
DRAPE LAPAROSCOPIC ABDOMINAL (DRAPES) IMPLANT
DRAPE LAPAROTOMY 100X72 PEDS (DRAPES) ×1 IMPLANT
DRSG ADAPTIC 3X8 NADH LF (GAUZE/BANDAGES/DRESSINGS) IMPLANT
DRSG CALCIUM ALGINATE 4X4 (GAUZE/BANDAGES/DRESSINGS) IMPLANT
DRSG TEGADERM 4X4.75 (GAUZE/BANDAGES/DRESSINGS) IMPLANT
DRSG VAC GRANUFOAM LG (GAUZE/BANDAGES/DRESSINGS) IMPLANT
DRSG VAC GRANUFOAM MED (GAUZE/BANDAGES/DRESSINGS) IMPLANT
DRSG VAC GRANUFOAM SM (GAUZE/BANDAGES/DRESSINGS) IMPLANT
ELECT BLADE 4.0 EZ CLEAN MEGAD (MISCELLANEOUS) ×1
ELECT CAUTERY BLADE 6.4 (BLADE) IMPLANT
ELECT REM PT RETURN 9FT ADLT (ELECTROSURGICAL) ×1
ELECTRODE BLDE 4.0 EZ CLN MEGD (MISCELLANEOUS) IMPLANT
ELECTRODE REM PT RTRN 9FT ADLT (ELECTROSURGICAL) ×1 IMPLANT
EVACUATOR SILICONE 100CC (DRAIN) IMPLANT
GAUZE PAD ABD 8X10 STRL (GAUZE/BANDAGES/DRESSINGS) IMPLANT
GAUZE SPONGE 2X2 8PLY STRL LF (GAUZE/BANDAGES/DRESSINGS) IMPLANT
GAUZE SPONGE 4X4 12PLY STRL (GAUZE/BANDAGES/DRESSINGS) IMPLANT
GLOVE BIO SURGEON STRL SZ8 (GLOVE) ×1 IMPLANT
GLOVE BIOGEL PI IND STRL 8 (GLOVE) ×1 IMPLANT
GOWN STRL REUS W/ TWL LRG LVL3 (GOWN DISPOSABLE) ×3 IMPLANT
GOWN STRL REUS W/TWL LRG LVL3 (GOWN DISPOSABLE) ×3
GOWN STRL REUS W/TWL XL LVL3 (GOWN DISPOSABLE) ×1 IMPLANT
HEMOSTAT ARISTA ABSORB 3G PWDR (HEMOSTASIS) IMPLANT
KIT BASIN OR (CUSTOM PROCEDURE TRAY) ×1 IMPLANT
KIT TURNOVER KIT B (KITS) ×1 IMPLANT
NDL HYPO 25GX1X1/2 BEV (NEEDLE) ×1 IMPLANT
NEEDLE HYPO 25GX1X1/2 BEV (NEEDLE) IMPLANT
NS IRRIG 1000ML POUR BTL (IV SOLUTION) ×1 IMPLANT
PACK GENERAL/GYN (CUSTOM PROCEDURE TRAY) ×1 IMPLANT
PAD ARMBOARD 7.5X6 YLW CONV (MISCELLANEOUS) ×2 IMPLANT
STAPLER VISISTAT (STAPLE) IMPLANT
STAPLER VISISTAT 35W (STAPLE) ×1 IMPLANT
SURGILUBE 2OZ TUBE FLIPTOP (MISCELLANEOUS) IMPLANT
SUT MNCRL AB 3-0 PS2 27 (SUTURE) IMPLANT
SUT MNCRL AB 4-0 PS2 18 (SUTURE) IMPLANT
SUT MON AB 2-0 CT1 36 (SUTURE) IMPLANT
SUT MON AB 5-0 PS2 18 (SUTURE) IMPLANT
SUT PDS AB 2-0 CT1 27 (SUTURE) IMPLANT
SUT SILK 2 0 SH (SUTURE) IMPLANT
SUT SILK 3 0 PS 1 (SUTURE) IMPLANT
SUT VIC AB 5-0 PS2 18 (SUTURE) IMPLANT
SUT VICRYL 3 0 (SUTURE) IMPLANT
SWAB COLLECTION DEVICE MRSA (MISCELLANEOUS) IMPLANT
SWAB CULTURE ESWAB REG 1ML (MISCELLANEOUS) IMPLANT
SYR BULB IRRIG 60ML STRL (SYRINGE) IMPLANT
SYR CONTROL 10ML LL (SYRINGE) ×1 IMPLANT
TOWEL GREEN STERILE (TOWEL DISPOSABLE) ×1 IMPLANT
UNDERPAD 30X36 HEAVY ABSORB (UNDERPADS AND DIAPERS) ×1 IMPLANT

## 2023-01-14 NOTE — H&P (View-Only) (Signed)
Subjective: Patient is a 57 year old female who underwent panniculectomy with Dr. Ladona Ridgel on 12/22/2022.  She is a little over 3 weeks postop.  She presented to the [facility] emergency room yesterday for increased pain to her surgical site and concern for infection.  Patient was transferred to [facility] and admitted meeting SIRS criteria.  She had a CT of the abdomen showing a large subcutaneous fluid collection which was concerning for abscess.  Patient's white count was elevated at 12.8, her lactate was normal.    Patient today tells me that starting on Saturday, she noticed increased swelling, redness and pain in her lower abdomen.  She states that the pain worsened and she eventually went to the emergency room yesterday.  She states she also had some fevers.  She reports the pain has been unbearable.  She also reports that her incision opened up a little bit and had been draining copious amounts of clear drainage.  Objective: Vital signs in last 24 hours: Temp:  [98 F (36.7 C)-99.2 F (37.3 C)] 98 F (36.7 C) (10/30 0801) Pulse Rate:  [85-109] 101 (10/30 0801) Resp:  [16-20] 20 (10/30 0801) BP: (108-148)/(65-99) 129/81 (10/30 0801) SpO2:  [96 %-100 %] 98 % (10/30 0801) Weight:  [85.7 kg] 85.7 kg (10/29 2315) Last BM Date : 01/13/23  Intake/Output from previous day: No intake/output data recorded. Intake/Output this shift: No intake/output data recorded.  General appearance: alert, cooperative, and no distress Resp: Unlabored breathing, no respiratory distress GI: Lower abdomen is significantly erythematous throughout.  There is induration noted, especially centrally.  There are also cellulitic changes noted to the skin.  There is some mild tenderness to palpation.  There is a small breakdown to the left lateral incision where there is some serous drainage noted on exam.  No purulent drainage was expressed.  Lab Results:     Latest Ref Rng & Units 01/13/2023   11:50 AM 03/12/2022     7:31 AM 03/03/2022    2:00 PM  CBC  WBC 4.0 - 10.5 K/uL 12.8  13.4  6.8   Hemoglobin 12.0 - 15.0 g/dL 16.1  09.6  04.5   Hematocrit 36.0 - 46.0 % 34.3  36.6  39.3   Platelets 150 - 400 K/uL 392  240  218     BMET Recent Labs    01/13/23 1150  NA 136  K 4.1  CL 99  CO2 27  GLUCOSE 98  BUN 10  CREATININE 0.55  CALCIUM 9.2   PT/INR No results for input(s): "LABPROT", "INR" in the last 72 hours. ABG No results for input(s): "PHART", "HCO3" in the last 72 hours.  Invalid input(s): "PCO2", "PO2"  Studies/Results: DG CHEST PORT 1 VIEW  Result Date: 01/14/2023 CLINICAL DATA:  Dyspnea on exertion. EXAM: PORTABLE CHEST 1 VIEW COMPARISON:  December 30, 2021. FINDINGS: The heart size and mediastinal contours are within normal limits. Both lungs are clear. The visualized skeletal structures are unremarkable. IMPRESSION: No active disease. Electronically Signed   By: Lupita Raider M.D.   On: 01/14/2023 08:12   CT ABDOMEN PELVIS W CONTRAST  Result Date: 01/13/2023 CLINICAL DATA:  Acute abdominal pain. Panniculectomy on the seventh of this month. EXAM: CT ABDOMEN AND PELVIS WITH CONTRAST TECHNIQUE: Multidetector CT imaging of the abdomen and pelvis was performed using the standard protocol following bolus administration of intravenous contrast. RADIATION DOSE REDUCTION: This exam was performed according to the departmental dose-optimization program which includes automated exposure control, adjustment of the mA and/or  kV according to patient size and/or use of iterative reconstruction technique. CONTRAST:  OMNIPAQUE IOHEXOL 300 MG/ML  SOLN COMPARISON:  None Available. FINDINGS: Lower chest: No acute abnormality. Hepatobiliary: No focal liver abnormality is seen. Status post cholecystectomy. No biliary dilatation. Pancreas: Unremarkable. No pancreatic ductal dilatation or surrounding inflammatory changes. Spleen: Normal in size without focal abnormality. Adrenals/Urinary Tract: Adrenal  glands are unremarkable. Kidneys are normal, without renal calculi, focal lesion, or hydronephrosis. Bladder is unremarkable. Stomach/Bowel: Stomach is within normal limits. Appendix appears normal. No evidence of bowel wall thickening, distention, or inflammatory changes. Vascular/Lymphatic: No significant vascular findings are present. No enlarged abdominal or pelvic lymph nodes. Reproductive: Uterus and bilateral adnexa are unremarkable. Other: There is an enhancing fluid collection in the subcutaneous tissues of the lower anterior abdominal wall with some loculations in the superior left region. Overall dimensions are 20.4 x 3.8 x 5.7 cm in largest transverse, AP and craniocaudad dimensions respectively. There is no soft tissue gas. There is surrounding subcutaneous body wall edema. There some surgical clips in the lower anterior abdominal wall. There is no abdominal wall hernia. There is no ascites. Musculoskeletal: Degenerative changes affect the spine. IMPRESSION: 1. Enhancing fluid collection in the subcutaneous tissues of the lower anterior abdominal wall measuring 20.4 x 3.8 x 5.7 cm. Findings are worrisome for abscess. 2. No acute localizing process in the abdomen or pelvis. Electronically Signed   By: Darliss Cheney M.D.   On: 01/13/2023 20:04    Anti-infectives: Anti-infectives (From admission, onward)    Start     Dose/Rate Route Frequency Ordered Stop   01/14/23 1000  metroNIDAZOLE (FLAGYL) tablet 500 mg        500 mg Oral Every 12 hours 01/14/23 0723     01/14/23 0800  metroNIDAZOLE (FLAGYL) IVPB 500 mg  Status:  Discontinued        500 mg 100 mL/hr over 60 Minutes Intravenous Every 12 hours 01/14/23 0707 01/14/23 2831       Assessment/Plan:  Plan: -Given large fluid collection on CT and physical exam, Dr. Ladona Ridgel will take the patient to the operating room this afternoon for washout and drain placement.  I discussed this with the patient.  Discussed with her to not eat or drink  anything today in preparation to go to the operating room.  Patient expressed understanding. -Patient to be NPO today -Plan discussed with Dr. Ladona Ridgel.  LOS: 1 day    Laurena Spies, PA-C 01/14/2023

## 2023-01-14 NOTE — Interval H&P Note (Signed)
History and Physical Interval Note: Discussed the procedure and indication for surgery with Ms Bernd. She understands that she needs to have the fluid drained and drains replaced. Surgical site marked. Questions answered. Will proceed at her request.  01/14/2023 4:20 PM  Sandi Raveling  has presented today for surgery, with the diagnosis of abdominal wall abscess.  The various methods of treatment have been discussed with the patient and family. After consideration of risks, benefits and other options for treatment, the patient has consented to  Procedure(s): Abdominal wall wound washout, drain placement (N/A) as a surgical intervention.  The patient's history has been reviewed, patient examined, no change in status, stable for surgery.  I have reviewed the patient's chart and labs.  Questions were answered to the patient's satisfaction.     Santiago Glad

## 2023-01-14 NOTE — H&P (Signed)
History and Physical    Megan Jimenez NFA:[id number] DOB: Jan 16, 1966 DOA: 01/13/2023  PCP: Jacky Kindle, FNP  Patient coming from: [facility] ED  Chief Complaint: Abdominal pain  HPI: Megan Jimenez is a 57 y.o. female with medical history significant of anxiety, arthritis, asthma, bipolar disorder, prediabetes, history of panniculectomy on 12/22/2022 presented to the ED with concern for postop infection.  Slightly tachycardic on arrival to the ED but afebrile.  Not hypotensive.  Labs showing WBC count 12.8, hemoglobin 11.4 (stable), lactate normal, blood cultures collected.  CT showing large subcutaneous fluid collection concerning for abscess. EDP discussed with Dr. Ulice Bold, plastic surgery will see the patient in consultation.  Patient was given Dilaudid, oxycodone, fentanyl, vancomycin, cefepime, metronidazole, 1 L IV fluids, and Zofran in the ED.  Patient is reporting 3-day history of erythema and pain at her panniculectomy incision site.  She has noted some purulent drainage.  Reporting intermittent fevers up to 102 F and generalized weakness.  Also reporting dyspnea on exertion and wheezing.  She is no longer using any inhalers for her asthma.  Denies chest pain.  Review of Systems:  Review of Systems  All other systems reviewed and are negative.   Past Medical History:  Diagnosis Date   Anxiety    Arthritis    Asthma    Bipolar 1 disorder (HCC)    COVID-19 03/29/2021   Family history of breast cancer    Family history of colon cancer    Family history of leukemia    Family history of lung cancer    Family history of ovarian cancer    Family history of throat cancer    Pre-diabetes     Past Surgical History:  Procedure Laterality Date   ANKLE FRACTURE SURGERY Right 1992   ANTERIOR CERVICAL DECOMP/DISCECTOMY FUSION N/A 04/23/2021   Procedure: ANTERIOR CERVICAL DISCECTOMIES AND FUSION C4-5, C5-6 AND C6-7 WITH PLATES, SCREWS, ALLOGRAFT ACDF GRAFT, LOCAL BONE GRAFT,  VIVIGEN;  Surgeon: Kerrin Champagne, MD;  Location: [facility] OR;  Service: Orthopedics;  Laterality: N/A;   CARPAL TUNNEL RELEASE Left 04/23/2021   Procedure: LEFT OPEN CARPAL TUNNEL RELEASE;  Surgeon: Kerrin Champagne, MD;  Location: [facility] OR;  Service: Orthopedics;  Laterality: Left;   CHOLECYSTECTOMY     COLONOSCOPY WITH PROPOFOL N/A 07/09/2020   Procedure: COLONOSCOPY WITH PROPOFOL;  Surgeon: Wyline Mood, MD;  Location: [facility] ENDOSCOPY;  Service: Gastroenterology;  Laterality: N/A;   TOTAL KNEE ARTHROPLASTY Right 03/11/2022   Procedure: RIGHT TOTAL KNEE ARTHROPLASTY;  Surgeon: Kathryne Hitch, MD;  Location: [facility] OR;  Service: Orthopedics;  Laterality: Right;  Needs RNFA     reports that she quit smoking about 18 years ago. Her smoking use included cigarettes. She started smoking about 43 years ago. She has a 12.5 pack-year smoking history. She has never used smokeless tobacco. She reports that she does not currently use alcohol. She reports that she does not use drugs.  No Known Allergies  Family History  Problem Relation Age of Onset   COPD Mother    Hyperlipidemia Mother    Lung cancer Mother 85   Hyperlipidemia Father    Leukemia Father 23   COPD Father    Depression Sister        ANXIETY   Healthy Sister    Breast cancer Maternal Aunt        dx 30s, recurrence 40s   Throat cancer Maternal Uncle    Lung cancer Paternal Uncle    Colon  cancer Maternal Grandmother    Ovarian cancer Cousin 63       paternal   Ovarian cancer Cousin 59    Prior to Admission medications   Medication Sig Start Date End Date Taking? Authorizing Provider  ALPRAZolam Prudy Feeler) 0.5 MG tablet Take 0.5 mg by mouth 3 (three) times daily as needed for anxiety.    [provider]  Ascorbic Acid (VITAMIN C) 1000 MG tablet Take 1,000 mg by mouth daily.    [provider]  Calcium Carb-Cholecalciferol (CALCIUM/VITAMIN D PO) Take 1 tablet by mouth daily.    [provider]  DULoxetine  (CYMBALTA) 30 MG capsule Take 2 capsules in am, take 1 capsule in pm. Follow up in 1-1.5 months 09/30/22   Merita Norton T, FNP  ferrous sulfate 325 (65 FE) MG tablet Take 325 mg by mouth daily with breakfast.    [provider]  lurasidone (LATUDA) 40 MG TABS tablet Take 40 mg by mouth daily. 10/31/22   [provider]  meloxicam (MOBIC) 15 MG tablet TAKE 1 TABLET (15 MG TOTAL) BY MOUTH DAILY. 11/18/22 11/18/23  Kathryne Hitch, MD  metFORMIN (GLUCOPHAGE-XR) 750 MG 24 hr tablet TAKE 1 TABLET (750 MG TOTAL) BY MOUTH 2 (TWO) TIMES DAILY AFTER A MEAL. 11/19/22   Jacky Kindle, FNP  Multiple Vitamin (MULTIVITAMIN WITH MINERALS) TABS tablet Take 1 tablet by mouth daily.    [provider]  Omega-3 Fatty Acids (FISH OIL PO) Take 1 capsule by mouth daily.    [provider]  ondansetron (ZOFRAN) 4 MG tablet Take 1 tablet (4 mg total) by mouth every 8 (eight) hours as needed for up to 20 doses for nausea or vomiting. 12/05/22   Laurena Spies, PA-C  oxyCODONE (ROXICODONE) 5 MG immediate release tablet Take 1 tablet (5 mg total) by mouth every 6 (six) hours as needed for up to 20 doses for severe pain. 12/09/22   Laurena Spies, PA-C  phentermine 37.5 MG capsule Take 1 capsule (37.5 mg total) by mouth every morning. Patient not taking: Reported on 01/05/2023 09/30/22   Merita Norton T, FNP  tiZANidine (ZANAFLEX) 4 MG tablet TAKE 1 TABLET BY MOUTH EVERY 6 HOURS AS NEEDED FOR MUSCLE SPASMS. 11/18/22   Kathryne Hitch, MD  vitamin B-12 (CYANOCOBALAMIN) 500 MCG tablet Take 500 mcg by mouth daily.    [provider]  Vitamin D, Ergocalciferol, (DRISDOL) 50000 units CAPS capsule Take 50,000 Units by mouth every Friday.     [provider]    Physical Exam: Vitals:   01/13/23 2315 01/14/23 0514  BP: 133/65 139/77  Pulse: (!) 101 (!) 109  Resp: 18   Temp: 98.6 F (37 C) 98.6 F (37 C)  TempSrc: Oral Oral  SpO2:  99%  Weight: 85.7 kg    Height: 5\' 10"  (1.778 m)     Physical Exam Vitals reviewed.  Constitutional:      General: She is not in acute distress. HENT:     Head: Normocephalic and atraumatic.  Eyes:     Extraocular Movements: Extraocular movements intact.  Cardiovascular:     Rate and Rhythm: Normal rate and regular rhythm.     Pulses: Normal pulses.  Pulmonary:     Effort: Pulmonary effort is normal. No respiratory distress.     Breath sounds: Wheezing present.     Comments: Mild scattered wheezing Abdominal:     General: Bowel sounds are normal. There is no distension.  Palpations: Abdomen is soft.     Tenderness: There is no abdominal tenderness.     Comments: Erythema in the lower abdomen surrounding the panniculectomy incision site  Musculoskeletal:     Cervical back: Normal range of motion.     Right lower leg: No edema.     Left lower leg: No edema.  Skin:    General: Skin is warm and dry.  Neurological:     General: No focal deficit present.     Mental Status: She is alert and oriented to person, place, and time.     Labs on Admission: I have personally reviewed following labs and imaging studies  CBC: Recent Labs  Lab 01/13/23 1150  WBC 12.8*  HGB 11.4*  HCT 34.3*  MCV 95.8  PLT 392   Basic Metabolic Panel: Recent Labs  Lab 01/13/23 1150  NA 136  K 4.1  CL 99  CO2 27  GLUCOSE 98  BUN 10  CREATININE 0.55  CALCIUM 9.2   GFR: Estimated Creatinine Clearance: 91.2 mL/min (by C-G formula based on SCr of 0.55 mg/dL). Liver Function Tests: Recent Labs  Lab 01/13/23 1150  AST 11*  ALT 10  ALKPHOS 62  BILITOT 0.6  PROT 6.8  ALBUMIN 3.3*   Recent Labs  Lab 01/13/23 1150  LIPASE 20   No results for input(s): "AMMONIA" in the last 168 hours. Coagulation Profile: No results for input(s): "INR", "PROTIME" in the last 168 hours. Cardiac Enzymes: No results for input(s): "CKTOTAL", "CKMB", "CKMBINDEX", "TROPONINI" in the last 168 hours. BNP (last 3 results) No  results for input(s): "PROBNP" in the last 8760 hours. HbA1C: No results for input(s): "HGBA1C" in the last 72 hours. CBG: No results for input(s): "GLUCAP" in the last 168 hours. Lipid Profile: No results for input(s): "CHOL", "HDL", "LDLCALC", "TRIG", "CHOLHDL", "LDLDIRECT" in the last 72 hours. Thyroid Function Tests: No results for input(s): "TSH", "T4TOTAL", "FREET4", "T3FREE", "THYROIDAB" in the last 72 hours. Anemia Panel: No results for input(s): "VITAMINB12", "FOLATE", "FERRITIN", "TIBC", "IRON", "RETICCTPCT" in the last 72 hours. Urine analysis:    Component Value Date/Time   COLORURINE AMBER (A) 01/13/2023 1150   APPEARANCEUR HAZY (A) 01/13/2023 1150   APPEARANCEUR Hazy (A) 04/30/2022 1424   LABSPEC 1.035 (H) 01/13/2023 1150   PHURINE 6.0 01/13/2023 1150   GLUCOSEU NEGATIVE 01/13/2023 1150   HGBUR NEGATIVE 01/13/2023 1150   BILIRUBINUR NEGATIVE 01/13/2023 1150   BILIRUBINUR Negative 04/30/2022 1424   KETONESUR 5 (A) 01/13/2023 1150   PROTEINUR 30 (A) 01/13/2023 1150   UROBILINOGEN 0.2 08/22/2021 1324   NITRITE NEGATIVE 01/13/2023 1150   LEUKOCYTESUR NEGATIVE 01/13/2023 1150    Radiological Exams on Admission: CT ABDOMEN PELVIS W CONTRAST  Result Date: 01/13/2023 CLINICAL DATA:  Acute abdominal pain. Panniculectomy on the seventh of this month. EXAM: CT ABDOMEN AND PELVIS WITH CONTRAST TECHNIQUE: Multidetector CT imaging of the abdomen and pelvis was performed using the standard protocol following bolus administration of intravenous contrast. RADIATION DOSE REDUCTION: This exam was performed according to the departmental dose-optimization program which includes automated exposure control, adjustment of the mA and/or kV according to patient size and/or use of iterative reconstruction technique. CONTRAST:  OMNIPAQUE IOHEXOL 300 MG/ML  SOLN COMPARISON:  None Available. FINDINGS: Lower chest: No acute abnormality. Hepatobiliary: No focal liver abnormality is seen. Status  post cholecystectomy. No biliary dilatation. Pancreas: Unremarkable. No pancreatic ductal dilatation or surrounding inflammatory changes. Spleen: Normal in size without focal abnormality. Adrenals/Urinary Tract: Adrenal glands are unremarkable. Kidneys  are normal, without renal calculi, focal lesion, or hydronephrosis. Bladder is unremarkable. Stomach/Bowel: Stomach is within normal limits. Appendix appears normal. No evidence of bowel wall thickening, distention, or inflammatory changes. Vascular/Lymphatic: No significant vascular findings are present. No enlarged abdominal or pelvic lymph nodes. Reproductive: Uterus and bilateral adnexa are unremarkable. Other: There is an enhancing fluid collection in the subcutaneous tissues of the lower anterior abdominal wall with some loculations in the superior left region. Overall dimensions are 20.4 x 3.8 x 5.7 cm in largest transverse, AP and craniocaudad dimensions respectively. There is no soft tissue gas. There is surrounding subcutaneous body wall edema. There some surgical clips in the lower anterior abdominal wall. There is no abdominal wall hernia. There is no ascites. Musculoskeletal: Degenerative changes affect the spine. IMPRESSION: 1. Enhancing fluid collection in the subcutaneous tissues of the lower anterior abdominal wall measuring 20.4 x 3.8 x 5.7 cm. Findings are worrisome for abscess. 2. No acute localizing process in the abdomen or pelvis. Electronically Signed   By: Darliss Cheney M.D.   On: 01/13/2023 20:04    Assessment and Plan  Postop infection SIRS Patient with history of panniculectomy 12/22/2022 presenting with concern for postop infection.  Afebrile in the ED but reports intermittent fevers up to 102 F at home. CT showing large subcutaneous fluid collection concerning for abscess.  Meets SIRS criteria with slight tachycardia and leukocytosis.  No lactic acidosis or hypotension.  Plastic surgery consulted by ED physician.  Keep n.p.o. and  continue IV antibiotics including vancomycin, cefepime, and metronidazole at this time.  Continue IV fluid hydration.  Trend lactate and WBC count.  Follow-up blood cultures.  Dyspnea on exertion Mild asthma exacerbation Mild wheezing on exam but no respiratory distress.  She is not hypoxic.  Chest x-ray ordered.  Scheduled Xopenex and Atrovent every 8 hours.  Will hold off steroids at this time in setting of postop infection/abscess.  Prediabetes Previous A1c values <6.5, repeat ordered.  Bipolar disorder, anxiety Pharmacy med rec pending.  DVT prophylaxis: SCDs Code Status: Full Code (discussed with the patient) Admission status: It is my clinical opinion that admission to INPATIENT is reasonable and necessary because of the expectation that this patient will require hospital care that crosses at least 2 midnights to treat this condition based on the medical complexity of the problems presented.  Given the aforementioned information, the predictability of an adverse outcome is felt to be significant.  John Giovanni MD [facility]  If 7PM-7AM, please contact night-coverage [email or web address]  01/14/2023, 5:55 AM

## 2023-01-14 NOTE — Progress Notes (Signed)
Pharmacy Antibiotic Note  Megan Jimenez is a 57 y.o. female admitted on 01/13/2023 with post op abdominal abscess.  Patient had panniculectomy 12/22/22. Pharmacy has been consulted for cefepime and vancomycin dosing. Patient is also on metronidazole  ClCr 91 ml/min. Received cefepime and vancomycin load in ED.  10/30 Vancomycin 750mg  Q 8 hr with Est AUC: 498 Scr used: 0.8 mg/dL; Vd coeff: 0.72 L/kg  Plan: Cefepime 2g q8hr  Vancomycin 750mg  q8hr Monitor cultures, clinical status, renal function, vancomycin level Narrow abx as able and f/u duration  F/u Plastic surg consult  Height: 5\' 10"  (177.8 cm) Weight: 85.7 kg (189 lb) IBW/kg (Calculated) : 68.5  Temp (24hrs), Avg:98.9 F (37.2 C), Min:98.6 F (37 C), Max:99.2 F (37.3 C)  Recent Labs  Lab 01/13/23 1150 01/13/23 1548  WBC 12.8*  --   CREATININE 0.55  --   LATICACIDVEN  --  0.7    Estimated Creatinine Clearance: 91.2 mL/min (by C-G formula based on SCr of 0.55 mg/dL).    No Known Allergies  Antimicrobials this admission: Cefe 10/29 >>  MTZ 10/29 >>  Vanc 10/29 >>   Dose adjustments this admission: N/a  Microbiology results: 10/29 BCx:    Thank you for allowing pharmacy to be a part of this patient's care.  Alphia Moh, PharmD, BCPS, BCCP Clinical Pharmacist  Please check AMION for all [facility] Pharmacy phone numbers After 10:00 PM, call Main Pharmacy [phone]

## 2023-01-14 NOTE — Transfer of Care (Signed)
Immediate Anesthesia Transfer of Care Note  Patient: Megan Jimenez  Procedure(s) Performed: Abdominal wall wound washout with drain placement (Abdomen)  Patient Location: PACU  Anesthesia Type:General  Level of Consciousness: awake, alert , and oriented  Airway & Oxygen Therapy: Patient connected to nasal cannula oxygen  Post-op Assessment: Report given to RN and Post -op Vital signs reviewed and stable  Post vital signs: Reviewed and stable  Last Vitals:  Vitals Value Taken Time  BP 119/86 01/14/23 1756  Temp    Pulse 104 01/14/23 1759  Resp 15 01/14/23 1759  SpO2 92% 01/14/23 1759  Vitals shown include unfiled device data.  Last Pain:  Vitals:   01/14/23 1526  TempSrc: Oral  PainSc: 10-Worst pain ever      Patients Stated Pain Goal: 2 (01/14/23 0745)  Complications: There were no known notable events for this encounter.

## 2023-01-14 NOTE — Anesthesia Procedure Notes (Signed)
Procedure Name: Intubation Date/Time: 01/14/2023 4:40 PM  Performed by: Sharyn Dross, CRNAPre-anesthesia Checklist: Patient identified, Emergency Drugs available, Suction available and Patient being monitored Patient Re-evaluated:Patient Re-evaluated prior to induction Oxygen Delivery Method: Circle system utilized Preoxygenation: Pre-oxygenation with 100% oxygen Induction Type: IV induction Ventilation: Mask ventilation without difficulty and Oral airway inserted - appropriate to patient size Laryngoscope Size: Mac and 4 Grade View: Grade I Tube type: Oral Tube size: 7.0 mm Number of attempts: 1 Airway Equipment and Method: Stylet and Oral airway Placement Confirmation: ETT inserted through vocal cords under direct vision, positive ETCO2 and breath sounds checked- equal and bilateral Secured at: 22 cm Tube secured with: Tape Dental Injury: Teeth and Oropharynx as per pre-operative assessment

## 2023-01-14 NOTE — Consult Note (Signed)
Subjective: Patient is a 57 year old female who underwent panniculectomy with Dr. Ladona Ridgel on 12/22/2022.  She is a little over 3 weeks postop.  She presented to the [facility] emergency room yesterday for increased pain to her surgical site and concern for infection.  Patient was transferred to [facility] and admitted meeting SIRS criteria.  She had a CT of the abdomen showing a large subcutaneous fluid collection which was concerning for abscess.  Patient's white count was elevated at 12.8, her lactate was normal.    Patient today tells me that starting on Saturday, she noticed increased swelling, redness and pain in her lower abdomen.  She states that the pain worsened and she eventually went to the emergency room yesterday.  She states she also had some fevers.  She reports the pain has been unbearable.  She also reports that her incision opened up a little bit and had been draining copious amounts of clear drainage.  Objective: Vital signs in last 24 hours: Temp:  [98 F (36.7 C)-99.2 F (37.3 C)] 98 F (36.7 C) (10/30 0801) Pulse Rate:  [85-109] 101 (10/30 0801) Resp:  [16-20] 20 (10/30 0801) BP: (108-148)/(65-99) 129/81 (10/30 0801) SpO2:  [96 %-100 %] 98 % (10/30 0801) Weight:  [85.7 kg] 85.7 kg (10/29 2315) Last BM Date : 01/13/23  Intake/Output from previous day: No intake/output data recorded. Intake/Output this shift: No intake/output data recorded.  General appearance: alert, cooperative, and no distress Resp: Unlabored breathing, no respiratory distress GI: Lower abdomen is significantly erythematous throughout.  There is induration noted, especially centrally.  There are also cellulitic changes noted to the skin.  There is some mild tenderness to palpation.  There is a small breakdown to the left lateral incision where there is some serous drainage noted on exam.  No purulent drainage was expressed.  Lab Results:     Latest Ref Rng & Units 01/13/2023   11:50 AM 03/12/2022     7:31 AM 03/03/2022    2:00 PM  CBC  WBC 4.0 - 10.5 K/uL 12.8  13.4  6.8   Hemoglobin 12.0 - 15.0 g/dL 16.1  09.6  04.5   Hematocrit 36.0 - 46.0 % 34.3  36.6  39.3   Platelets 150 - 400 K/uL 392  240  218     BMET Recent Labs    01/13/23 1150  NA 136  K 4.1  CL 99  CO2 27  GLUCOSE 98  BUN 10  CREATININE 0.55  CALCIUM 9.2   PT/INR No results for input(s): "LABPROT", "INR" in the last 72 hours. ABG No results for input(s): "PHART", "HCO3" in the last 72 hours.  Invalid input(s): "PCO2", "PO2"  Studies/Results: DG CHEST PORT 1 VIEW  Result Date: 01/14/2023 CLINICAL DATA:  Dyspnea on exertion. EXAM: PORTABLE CHEST 1 VIEW COMPARISON:  December 30, 2021. FINDINGS: The heart size and mediastinal contours are within normal limits. Both lungs are clear. The visualized skeletal structures are unremarkable. IMPRESSION: No active disease. Electronically Signed   By: Lupita Raider M.D.   On: 01/14/2023 08:12   CT ABDOMEN PELVIS W CONTRAST  Result Date: 01/13/2023 CLINICAL DATA:  Acute abdominal pain. Panniculectomy on the seventh of this month. EXAM: CT ABDOMEN AND PELVIS WITH CONTRAST TECHNIQUE: Multidetector CT imaging of the abdomen and pelvis was performed using the standard protocol following bolus administration of intravenous contrast. RADIATION DOSE REDUCTION: This exam was performed according to the departmental dose-optimization program which includes automated exposure control, adjustment of the mA and/or  kV according to patient size and/or use of iterative reconstruction technique. CONTRAST:  OMNIPAQUE IOHEXOL 300 MG/ML  SOLN COMPARISON:  None Available. FINDINGS: Lower chest: No acute abnormality. Hepatobiliary: No focal liver abnormality is seen. Status post cholecystectomy. No biliary dilatation. Pancreas: Unremarkable. No pancreatic ductal dilatation or surrounding inflammatory changes. Spleen: Normal in size without focal abnormality. Adrenals/Urinary Tract: Adrenal  glands are unremarkable. Kidneys are normal, without renal calculi, focal lesion, or hydronephrosis. Bladder is unremarkable. Stomach/Bowel: Stomach is within normal limits. Appendix appears normal. No evidence of bowel wall thickening, distention, or inflammatory changes. Vascular/Lymphatic: No significant vascular findings are present. No enlarged abdominal or pelvic lymph nodes. Reproductive: Uterus and bilateral adnexa are unremarkable. Other: There is an enhancing fluid collection in the subcutaneous tissues of the lower anterior abdominal wall with some loculations in the superior left region. Overall dimensions are 20.4 x 3.8 x 5.7 cm in largest transverse, AP and craniocaudad dimensions respectively. There is no soft tissue gas. There is surrounding subcutaneous body wall edema. There some surgical clips in the lower anterior abdominal wall. There is no abdominal wall hernia. There is no ascites. Musculoskeletal: Degenerative changes affect the spine. IMPRESSION: 1. Enhancing fluid collection in the subcutaneous tissues of the lower anterior abdominal wall measuring 20.4 x 3.8 x 5.7 cm. Findings are worrisome for abscess. 2. No acute localizing process in the abdomen or pelvis. Electronically Signed   By: Darliss Cheney M.D.   On: 01/13/2023 20:04    Anti-infectives: Anti-infectives (From admission, onward)    Start     Dose/Rate Route Frequency Ordered Stop   01/14/23 1000  metroNIDAZOLE (FLAGYL) tablet 500 mg        500 mg Oral Every 12 hours 01/14/23 0723     01/14/23 0800  metroNIDAZOLE (FLAGYL) IVPB 500 mg  Status:  Discontinued        500 mg 100 mL/hr over 60 Minutes Intravenous Every 12 hours 01/14/23 0707 01/14/23 2831       Assessment/Plan:  Plan: -Given large fluid collection on CT and physical exam, Dr. Ladona Ridgel will take the patient to the operating room this afternoon for washout and drain placement.  I discussed this with the patient.  Discussed with her to not eat or drink  anything today in preparation to go to the operating room.  Patient expressed understanding. -Patient to be NPO today -Plan discussed with Dr. Ladona Ridgel.  LOS: 1 day    Laurena Spies, PA-C 01/14/2023

## 2023-01-14 NOTE — Op Note (Signed)
DATE OF OPERATION: 01/14/2023  LOCATION: [facility] Main operating Room  PREOPERATIVE DIAGNOSIS: Infected seroma, status post panniculectomy  POSTOPERATIVE DIAGNOSIS: Same  PROCEDURE: Abdominal wound exploration, seroma cavity washout, drain placement  SURGEON: Loren Racer, MD  ASSISTANT: Caroline More  EBL: 25 cc  CONDITION: Stable  COMPLICATIONS: None  INDICATION: The patient, Megan Jimenez, is a 57 y.o. female born on [birth year], is here for treatment of abdominal wall cellulitis secondary to probable infected seroma.  Patient was seen at an outside hospital and transferred to [facility] this morning with a diagnosis of cellulitis and possible SIRS.  She had previously undergone a panniculectomy and had been doing well with both drains removed.  After evaluation of the CT scan and her abdominal wall I felt that it was indicated to take her to the operating room for washout and placement of drains.   PROCEDURE DETAILS:  The patient was seen prior to surgery and marked.   IV antibiotics were given. The patient was taken to the operating room and given a general anesthetic. A standard time out was performed and all information was confirmed by those in the room. SCDs were placed.   The abdomen was prepped and draped in usual sterile manner.  The left lateral portion of the incision where the primary induration was was opened and approximately 30 to 50 mL of clear fluid was removed from the panniculectomy cavity.  Cultures were obtained and sent for aerobic and anaerobic culture.  The cavity was extensively irrigated with saline.  Hemostasis was achieved with electrocautery.  2-19 [place] round drains were placed in the surgical bed and brought out through separate stab incisions.  The surgical bed was then coated with Arista hemostatic agent and the deep tissues were approximated with interrupted 2-0 PDS sutures.  The dermal edges were approximated with interrupted and runnin 3-0 Monocryl sutures and  the skin was closed with skin clips dressings and an abdominal binder were placed.  The patient was awakened from anesthesia without incident transferred to the recovery room in good condition.  All instrument needle and sponge counts reported as correct and there were no complications.  The patient will stay overnight for IV antibiotics and will reevaluate tomorrow regarding possible discharge home on oral antibiotics. The patient was allowed to wake up and taken to recovery room in stable condition at the end of the case. The family was notified at the end of the case.   The advanced practice practitioner (APP) assisted throughout the case.  The APP was essential in retraction and counter traction when needed to make the case progress smoothly.  This retraction and assistance made it possible to see the tissue plans for the procedure.  The assistance was needed for blood control, tissue re-approximation and assisted with closure of the incision site.

## 2023-01-14 NOTE — Progress Notes (Addendum)
PROGRESS NOTE  DALANIE CHINO  WUJ:[id number] DOB: 1965/06/22 DOA: 01/13/2023 PCP: Jacky Kindle, FNP   Brief Narrative: Patient is a 57 year old female with history of anxiety, arthritis, asthma, bipolar disorder, recent panniculectomy on 12/22/2022 presented with abdominal pain, erythema at panniculectomy incision site, purulent drainage, fever, generalized weakness.[name]  On presentation, she was mildly tachycardic, afebrile.  Blood pressure stable.  Labs showed leukocytosis of 12.8.  CT imaging showed large subcutaneous fluid collection concerning for abscess.  Plastic surgery consulted, plan for I&D.  Currently on broad-spectrum antibiotics.  Cultures pending.  Assessment & Plan:  Principal Problem:   Post op infection Active Problems:   Anxiety   Acute asthma exacerbation   Prediabetes   Bipolar disorder (HCC)   SIRS (systemic inflammatory response syndrome) (HCC)   Sepsis secondary to surgical wound infection: History of recent panniculectomy on 10/7 /24.  Presented with fever, abdominal pain, drainage, erythema at the incision site.  CT showed large subcutaneous fluid collection concerning for abscess.  On presentation, she was tachycardic, had leukocytosis.  Started on broad spectrum antibiotics including vancomycin, cefepime, metronidazole.  Continue gentle IV fluids.  Follow-up cultures.  Leukocytosis has resolved.  Asthma exacerbation: History of asthma.  Mild wheezing on presentation.  Chest x-ray does not show any pneumonia.  Continue bronchodilators as needed.  Bipolar disorder/anxiety: Takes Cymbalta, Xanax        DVT prophylaxis:SCDs Start: 01/14/23 0704     Code Status: Full Code  Family Communication: None at bedside  Patient status:Inpatient  Patient is from :home  Anticipated discharge NF:AOZH  Estimated DC date:after plastic surgery clearance   Consultants: Plastic surgery  Procedures:None yet  Antimicrobials:  Anti-infectives (From admission,  onward)    Start     Dose/Rate Route Frequency Ordered Stop   01/14/23 1000  metroNIDAZOLE (FLAGYL) tablet 500 mg        500 mg Oral Every 12 hours 01/14/23 0723     01/14/23 0800  metroNIDAZOLE (FLAGYL) IVPB 500 mg  Status:  Discontinued        500 mg 100 mL/hr over 60 Minutes Intravenous Every 12 hours 01/14/23 0707 01/14/23 0723       Subjective: Patient seen and examined at bedside today .  Lying on bed.  Feels better this afternoon.  Acutely waiting for surgery.  Denies significant discomfort on the panniculectomy site today  Objective: Vitals:   01/13/23 2315 01/14/23 0514 01/14/23 0801 01/14/23 1124  BP: 133/65 139/77 129/81 123/76  Pulse: (!) 101 (!) 109 (!) 101 98  Resp: 18  20 18   Temp: 98.6 F (37 C) 98.6 F (37 C) 98 F (36.7 C) 99 F (37.2 C)  TempSrc: Oral Oral Oral Oral  SpO2:  99% 98% 95%  Weight: 85.7 kg     Height: 5\' 10"  (1.778 m)       Intake/Output Summary (Last 24 hours) at 01/14/2023 1250 Last data filed at 01/14/2023 0900 Gross per 24 hour  Intake 0 ml  Output --  Net 0 ml   Filed Weights   01/13/23 2315  Weight: 85.7 kg    Examination:  General exam: Overall comfortable, not in distress HEENT: PERRL Respiratory system:  no wheezes or crackles  Cardiovascular system: S1 & S2 heard, RRR.  Gastrointestinal system: Abdomen is nondistended, soft .  Erythematous, tender area around the panniculectomy incision wound and pubic region Central nervous system: Alert and oriented Extremities: No edema, no clubbing ,no cyanosis Skin: No rashes, no ulcers,no icterus  Data Reviewed: I have personally reviewed following labs and imaging studies  CBC: Recent Labs  Lab 01/13/23 1150 01/14/23 0836  WBC 12.8* 10.4  HGB 11.4* 10.1*  HCT 34.3* 31.0*  MCV 95.8 95.7  PLT 392 361   Basic Metabolic Panel: Recent Labs  Lab 01/13/23 1150  NA 136  K 4.1  CL 99  CO2 27  GLUCOSE 98  BUN 10  CREATININE 0.55  CALCIUM 9.2     No results  found for this or any previous visit (from the past 240 hour(s)).   Radiology Studies: DG CHEST PORT 1 VIEW  Result Date: 01/14/2023 CLINICAL DATA:  Dyspnea on exertion. EXAM: PORTABLE CHEST 1 VIEW COMPARISON:  December 30, 2021. FINDINGS: The heart size and mediastinal contours are within normal limits. Both lungs are clear. The visualized skeletal structures are unremarkable. IMPRESSION: No active disease. Electronically Signed   By: Lupita Raider M.D.   On: 01/14/2023 08:12   CT ABDOMEN PELVIS W CONTRAST  Result Date: 01/13/2023 CLINICAL DATA:  Acute abdominal pain. Panniculectomy on the seventh of this month. EXAM: CT ABDOMEN AND PELVIS WITH CONTRAST TECHNIQUE: Multidetector CT imaging of the abdomen and pelvis was performed using the standard protocol following bolus administration of intravenous contrast. RADIATION DOSE REDUCTION: This exam was performed according to the departmental dose-optimization program which includes automated exposure control, adjustment of the mA and/or kV according to patient size and/or use of iterative reconstruction technique. CONTRAST:  OMNIPAQUE IOHEXOL 300 MG/ML  SOLN COMPARISON:  None Available. FINDINGS: Lower chest: No acute abnormality. Hepatobiliary: No focal liver abnormality is seen. Status post cholecystectomy. No biliary dilatation. Pancreas: Unremarkable. No pancreatic ductal dilatation or surrounding inflammatory changes. Spleen: Normal in size without focal abnormality. Adrenals/Urinary Tract: Adrenal glands are unremarkable. Kidneys are normal, without renal calculi, focal lesion, or hydronephrosis. Bladder is unremarkable. Stomach/Bowel: Stomach is within normal limits. Appendix appears normal. No evidence of bowel wall thickening, distention, or inflammatory changes. Vascular/Lymphatic: No significant vascular findings are present. No enlarged abdominal or pelvic lymph nodes. Reproductive: Uterus and bilateral adnexa are unremarkable. Other:  There is an enhancing fluid collection in the subcutaneous tissues of the lower anterior abdominal wall with some loculations in the superior left region. Overall dimensions are 20.4 x 3.8 x 5.7 cm in largest transverse, AP and craniocaudad dimensions respectively. There is no soft tissue gas. There is surrounding subcutaneous body wall edema. There some surgical clips in the lower anterior abdominal wall. There is no abdominal wall hernia. There is no ascites. Musculoskeletal: Degenerative changes affect the spine. IMPRESSION: 1. Enhancing fluid collection in the subcutaneous tissues of the lower anterior abdominal wall measuring 20.4 x 3.8 x 5.7 cm. Findings are worrisome for abscess. 2. No acute localizing process in the abdomen or pelvis. Electronically Signed   By: Darliss Cheney M.D.   On: 01/13/2023 20:04    Scheduled Meds:  ipratropium  0.5 mg Nebulization Q8H   levalbuterol  0.63 mg Nebulization Q8H   metroNIDAZOLE  500 mg Oral Q12H   Continuous Infusions:  sodium chloride 125 mL/hr at 01/14/23 0750     LOS: 1 day   Burnadette Pop, MD [facility] P10/30/2024, 12:50 PM

## 2023-01-14 NOTE — Plan of Care (Signed)
  Problem: Pain Management: Goal: General experience of comfort will improve Outcome: Progressing   Problem: Safety: Goal: Ability to remain free from injury will improve Outcome: Progressing

## 2023-01-14 NOTE — Anesthesia Preprocedure Evaluation (Addendum)
Anesthesia Evaluation  Patient identified by MRN, date of birth, ID band Patient awake    Reviewed: Allergy & Precautions, NPO status , Patient's Chart, lab work & pertinent test results  Airway Mallampati: II  TM Distance: >3 FB Neck ROM: Full    Dental  (+) Dental Advisory Given   Pulmonary asthma , former smoker   breath sounds clear to auscultation       Cardiovascular hypertension,  Rhythm:Regular Rate:Normal     Neuro/Psych  Headaches PSYCHIATRIC DISORDERS Anxiety Depression Bipolar Disorder    Neuromuscular disease    GI/Hepatic negative GI ROS, Neg liver ROS,,,  Endo/Other  negative endocrine ROS    Renal/GU negative Renal ROS     Musculoskeletal  (+) Arthritis ,    Abdominal   Peds  Hematology negative hematology ROS (+)   Anesthesia Other Findings abdominal wall abscess  Reproductive/Obstetrics                             Anesthesia Physical Anesthesia Plan  ASA: 3  Anesthesia Plan: General   Post-op Pain Management: Ofirmev IV (intra-op)* and Toradol IV (intra-op)*   Induction: Intravenous  PONV Risk Score and Plan: 3 and Dexamethasone, Ondansetron and Treatment may vary due to age or medical condition  Airway Management Planned: Oral ETT  Additional Equipment:   Intra-op Plan:   Post-operative Plan: Extubation in OR  Informed Consent: I have reviewed the patients History and Physical, chart, labs and discussed the procedure including the risks, benefits and alternatives for the proposed anesthesia with the patient or authorized representative who has indicated his/her understanding and acceptance.     Dental advisory given  Plan Discussed with: CRNA  Anesthesia Plan Comments:        Anesthesia Quick Evaluation

## 2023-01-14 NOTE — Progress Notes (Signed)
[facility] Pre-op Review 1.Surgical orders: Consent orders: no order Consent signed: Patient alert and oriented: x4 Antibiotic: Flagyl LD 1018-10/30 Pre-meds: no orders  2.  Pre-procedure checklist completed: asked RN To complete  3.  NPO: order placed at 0705 10/30  4.  CHG Bath completed: asked RN to complete      Belongings removed and placed in clean gown:  5.  Labs Performed: CBC WNL CMP/BMP  PT/INR HA1C 5 Type and Screen Surgical PCR:  Pregnancy: postmenopausal EKG:  Chest x-ray:   6.  Recent H&P or progress note if inpatient: H&P 10/29  7.  Language Barrier:  none  8.  Vital Signs: WNL Oxygen: RA Tele: no Can the patient travel without Tele  9.  Medications: Cardiac Drips: na Pain Medications: see mar Beta Blocker:  Anticoagulants:  GLP1:  10. IV access: 20g LFA. 18G RAC  11. Diabetic:    na  12. Additional Information that short stay should be aware of?

## 2023-01-15 ENCOUNTER — Encounter (HOSPITAL_COMMUNITY): Payer: Self-pay | Admitting: Plastic Surgery

## 2023-01-15 DIAGNOSIS — T8140XA Infection following a procedure, unspecified, initial encounter: Secondary | ICD-10-CM | POA: Diagnosis not present

## 2023-01-15 LAB — CBC
HCT: 31.4 % — ABNORMAL LOW (ref 36.0–46.0)
Hemoglobin: 10.2 g/dL — ABNORMAL LOW (ref 12.0–15.0)
MCH: 31.7 pg (ref 26.0–34.0)
MCHC: 32.5 g/dL (ref 30.0–36.0)
MCV: 97.5 fL (ref 80.0–100.0)
Platelets: 398 10*3/uL (ref 150–400)
RBC: 3.22 MIL/uL — ABNORMAL LOW (ref 3.87–5.11)
RDW: 12.1 % (ref 11.5–15.5)
WBC: 11.3 10*3/uL — ABNORMAL HIGH (ref 4.0–10.5)
nRBC: 0 % (ref 0.0–0.2)

## 2023-01-15 LAB — BASIC METABOLIC PANEL
Anion gap: 9 (ref 5–15)
BUN: 9 mg/dL (ref 6–20)
CO2: 24 mmol/L (ref 22–32)
Calcium: 8.5 mg/dL — ABNORMAL LOW (ref 8.9–10.3)
Chloride: 104 mmol/L (ref 98–111)
Creatinine, Ser: 0.47 mg/dL (ref 0.44–1.00)
GFR, Estimated: >60 mL/min (ref >60)
Glucose, Bld: 155 mg/dL — ABNORMAL HIGH (ref 70–99)
Potassium: 3.8 mmol/L (ref 3.5–5.1)
Sodium: 137 mmol/L (ref 135–145)

## 2023-01-15 MED ORDER — IBUPROFEN 400 MG PO TABS
400.0000 mg | ORAL_TABLET | Freq: Four times a day (QID) | ORAL | Status: DC | PRN
  Administered 2023-01-15 – 2023-01-16 (×2): 400 mg via ORAL
  Filled 2023-01-15 (×2): qty 1

## 2023-01-15 MED ORDER — HYDROMORPHONE HCL 1 MG/ML IJ SOLN
1.0000 mg | INTRAMUSCULAR | Status: DC | PRN
  Administered 2023-01-15 – 2023-01-16 (×7): 1 mg via INTRAVENOUS
  Filled 2023-01-15 (×7): qty 1

## 2023-01-15 NOTE — Plan of Care (Signed)
  Problem: Pain Management: Goal: General experience of comfort will improve Outcome: Progressing   Problem: Safety: Goal: Ability to remain free from injury will improve Outcome: Progressing

## 2023-01-15 NOTE — Anesthesia Postprocedure Evaluation (Signed)
Anesthesia Post Note  Patient: Megan Jimenez  Procedure(s) Performed: Abdominal wall wound washout with drain placement (Abdomen)     Patient location during evaluation: PACU Anesthesia Type: General Level of consciousness: awake and alert Pain management: pain level controlled Vital Signs Assessment: post-procedure vital signs reviewed and stable Respiratory status: spontaneous breathing, nonlabored ventilation, respiratory function stable and patient connected to nasal cannula oxygen Cardiovascular status: blood pressure returned to baseline and stable Postop Assessment: no apparent nausea or vomiting Anesthetic complications: no   There were no known notable events for this encounter.  Last Vitals:  Vitals:   01/15/23 1930 01/15/23 2034  BP: 135/64 115/66  Pulse: (!) 124 (!) 108  Resp: 16 16  Temp: (!) 38.3 C 37 C  SpO2: 94% 97%    Last Pain:  Vitals:   01/15/23 2034  TempSrc: Oral  PainSc:                  Kennieth Rad

## 2023-01-15 NOTE — Progress Notes (Signed)
Notified that patient is " feeling worse, like she did when this began".  She remains afebrile per report.  She states that she has been having chills then feeling hot. She states her pain around the incision has worsened.  On examination her mons pubis is still quite erythematous and tender to palpation. The erythema around the incision is stable. He drain output is thin and clear.  Cultures show only rare staph. WBC is still elevated  but down from admission.  She remains on Vancomycin.   S/P panniculectomy, post op wound infection. Will keep overnight for additional IVABX and monitoring. If she does not improve will ask ID to consult.

## 2023-01-15 NOTE — Progress Notes (Signed)
Megan Jimenez is now approximately 12 hours postop from abdominal wound washout and placement of drains.  This morning she states that she is feeling much better and her pain is significantly improved.  She is hungry and ready to eat.  She has been up ambulating and voiding without difficulty.  She is afebrile this morning  On examination she still has moderate erythema over the mons pubis but the erythema over the remainder of the abdominal wall is essentially gone and all of the erythema and induration is significantly improved.  Her drain output is a serosanguineous fluid.  I have given the patient a regular diet this morning.  I have also encouraged her to be out of bed and ambulating is much as possible.  If possible I would like for her to stay on IV antibiotics throughout the day.  I will see her again this afternoon after clinic and if she is doing well I believe that she can be discharged home.  She will continue the drains until the drains are clearly below 30 mL per 24 hours.  I will also keep her on oral antibiotics as she did have gram-positive cocci in pairs on her Gram stain.   I appreciate Dr.Adhikari and the hospitalist team assistance in her care

## 2023-01-15 NOTE — Progress Notes (Signed)
PROGRESS NOTE  TITANNA Jimenez  YQI:[id number] DOB: Sep 28, 1965 DOA: 01/13/2023 PCP: Megan Kindle, FNP   Brief Narrative: Patient is a 57 year old female with history of anxiety, arthritis, asthma, bipolar disorder, recent panniculectomy on 12/22/2022 presented with abdominal pain, erythema at panniculectomy incision site, purulent drainage, fever, generalized weakness.[name]  On presentation, she was mildly tachycardic, afebrile.  Blood pressure stable.  Labs showed leukocytosis of 12.8.  CT imaging showed large subcutaneous fluid collection concerning for abscess.  Plastic surgery consulted, plan for I&D.  Currently on broad-spectrum antibiotics.  Status post abdominal wound exploration, seroma cavity washout and drainage placement on 10/30 by plastic surgery.  Assessment & Plan:  Principal Problem:   Post op infection Active Problems:   Anxiety   Acute asthma exacerbation   Prediabetes   Bipolar disorder (HCC)   SIRS (systemic inflammatory response syndrome) (HCC)   Sepsis secondary to surgical wound infection: History of recent panniculectomy on 10/7 /24.  Presented with fever, abdominal pain, drainage, erythema at the incision site.  CT showed large subcutaneous fluid collection concerning for abscess.  On presentation, she was tachycardic, had leukocytosis.  Started on broad spectrum antibiotics including vancomycin, cefepime, metronidazole.   Leukocytosis has resolved.  Status post abdominal wound exploration, seroma cavity washout and drainage placement on 10/30 by plastic surgery.  Wound culture showing rare Staphylococcus aureus, will follow-up the final culture report  Asthma exacerbation: History of asthma.  Mild wheezing on presentation.  Chest x-ray does not show any pneumonia.  Continue bronchodilators as needed.  On room air  Bipolar disorder/anxiety: Takes Cymbalta, Xanax        DVT prophylaxis:SCDs Start: 01/14/23 0704     Code Status: Full Code  Family  Communication: None at bedside  Patient status:Inpatient  Patient is from :home  Anticipated discharge LO:VFIE  Estimated DC date:after plastic surgery clearance, likely tomorrow   Consultants: Plastic surgery  Procedures: Abdominal wound exploration  Antimicrobials:  Anti-infectives (From admission, onward)    Start     Dose/Rate Route Frequency Ordered Stop   01/15/23 0015  metroNIDAZOLE (FLAGYL) IVPB 500 mg        500 mg 100 mL/hr over 60 Minutes Intravenous  Once 01/14/23 2326 01/15/23 0152   01/14/23 1500  vancomycin (VANCOREADY) IVPB 750 mg/150 mL        750 mg 150 mL/hr over 60 Minutes Intravenous Every 8 hours 01/14/23 1414     01/14/23 1500  ceFEPIme (MAXIPIME) 2 g in sodium chloride 0.9 % 100 mL IVPB  Status:  Discontinued        2 g 200 mL/hr over 30 Minutes Intravenous Every 8 hours 01/14/23 1414 01/15/23 0954   01/14/23 1000  metroNIDAZOLE (FLAGYL) tablet 500 mg        500 mg Oral Every 12 hours 01/14/23 0723     01/14/23 0800  metroNIDAZOLE (FLAGYL) IVPB 500 mg  Status:  Discontinued        500 mg 100 mL/hr over 60 Minutes Intravenous Every 12 hours 01/14/23 0707 01/14/23 0723       Subjective: Patient seen and examined the bedside today.  Feels much better today.  Comfortable.  About to order the breakfast.  No fever.  Pain has significantly improved. Objective: Vitals:   01/14/23 2019 01/15/23 0022 01/15/23 0536 01/15/23 0812  BP: 136/78 121/73 119/68 122/70  Pulse: 97 86 95 98  Resp: 18 16 17 17   Temp: 98.2 F (36.8 C) 98.1 F (36.7 C) 98 F (36.7 C) 98  F (36.7 C)  TempSrc: Oral Oral  Oral  SpO2: 94% 94% 95% 98%  Weight:      Height:        Intake/Output Summary (Last 24 hours) at 01/15/2023 1321 Last data filed at 01/15/2023 1000 Gross per 24 hour  Intake 1340 ml  Output 150 ml  Net 1190 ml   Filed Weights   01/13/23 2315  Weight: 85.7 kg    Examination:  General exam: Overall comfortable, not in distress HEENT:  PERRL Respiratory system:  no wheezes or crackles  Cardiovascular system: S1 & S2 heard, RRR.  Gastrointestinal system: Abdomen is nondistended, soft .  Abdominal binder, abdomen wound covered dressing, drain Central nervous system: Alert and oriented Extremities: No edema, no clubbing ,no cyanosis Skin: No rashes, no ulcers,no icterus     Data Reviewed: I have personally reviewed following labs and imaging studies  CBC: Recent Labs  Lab 01/13/23 1150 01/14/23 0836 01/15/23 0922  WBC 12.8* 10.4 11.3*  HGB 11.4* 10.1* 10.2*  HCT 34.3* 31.0* 31.4*  MCV 95.8 95.7 97.5  PLT 392 361 398   Basic Metabolic Panel: Recent Labs  Lab 01/13/23 1150 01/15/23 0922  NA 136 137  K 4.1 3.8  CL 99 104  CO2 27 24  GLUCOSE 98 155*  BUN 10 9  CREATININE 0.55 0.47  CALCIUM 9.2 8.5*     Recent Results (from the past 240 hour(s))  Blood culture (routine x 2)     Status: None (Preliminary result)   Collection Time: 01/13/23  3:48 PM   Specimen: BLOOD  Result Value Ref Range Status   Specimen Description BLOOD RIGHT ANTECUBITAL  Final   Special Requests   Final    BOTTLES DRAWN AEROBIC AND ANAEROBIC Blood Culture results may not be optimal due to an excessive volume of blood received in culture bottles   Culture   Final    NO GROWTH 2 DAYS Performed at [facility], [address] [place], [place] [address]    Report Status PENDING  Incomplete  Blood culture (routine x 2)     Status: None (Preliminary result)   Collection Time: 01/13/23  4:10 PM   Specimen: BLOOD  Result Value Ref Range Status   Specimen Description BLOOD BLOOD LEFT FOREARM  Final   Special Requests   Final    BOTTLES DRAWN AEROBIC AND ANAEROBIC Blood Culture adequate volume   Culture   Final    NO GROWTH 2 DAYS Performed at [facility], [address] [place], [place] [address]    Report Status PENDING  Incomplete  Aerobic Culture w Gram Stain (superficial specimen)     Status: None  (Preliminary result)   Collection Time: 01/14/23  5:06 PM   Specimen: Wound  Result Value Ref Range Status   Specimen Description WOUND LEFT LOWER ABDOMEN  Final   Special Requests PT ON VANC  Final   Gram Stain   Final    FEW WBC PRESENT, PREDOMINANTLY PMN RARE GRAM POSITIVE COCCI IN PAIRS    Culture   Final    RARE STAPHYLOCOCCUS AUREUS CULTURE REINCUBATED FOR BETTER GROWTH Performed at [facility], [address], [place], [place] [address]    Report Status PENDING  Incomplete     Radiology Studies: DG CHEST PORT 1 VIEW  Result Date: 01/14/2023 CLINICAL DATA:  Dyspnea on exertion. EXAM: PORTABLE CHEST 1 VIEW COMPARISON:  December 30, 2021. FINDINGS: The heart size and mediastinal contours are within  normal limits. Both lungs are clear. The visualized skeletal structures are unremarkable. IMPRESSION: No active disease. Electronically Signed   By: Lupita Raider M.D.   On: 01/14/2023 08:12   CT ABDOMEN PELVIS W CONTRAST  Result Date: 01/13/2023 CLINICAL DATA:  Acute abdominal pain. Panniculectomy on the seventh of this month. EXAM: CT ABDOMEN AND PELVIS WITH CONTRAST TECHNIQUE: Multidetector CT imaging of the abdomen and pelvis was performed using the standard protocol following bolus administration of intravenous contrast. RADIATION DOSE REDUCTION: This exam was performed according to the departmental dose-optimization program which includes automated exposure control, adjustment of the mA and/or kV according to patient size and/or use of iterative reconstruction technique. CONTRAST:  OMNIPAQUE IOHEXOL 300 MG/ML  SOLN COMPARISON:  None Available. FINDINGS: Lower chest: No acute abnormality. Hepatobiliary: No focal liver abnormality is seen. Status post cholecystectomy. No biliary dilatation. Pancreas: Unremarkable. No pancreatic ductal dilatation or surrounding inflammatory changes. Spleen: Normal in size without focal abnormality. Adrenals/Urinary Tract: Adrenal glands are  unremarkable. Kidneys are normal, without renal calculi, focal lesion, or hydronephrosis. Bladder is unremarkable. Stomach/Bowel: Stomach is within normal limits. Appendix appears normal. No evidence of bowel wall thickening, distention, or inflammatory changes. Vascular/Lymphatic: No significant vascular findings are present. No enlarged abdominal or pelvic lymph nodes. Reproductive: Uterus and bilateral adnexa are unremarkable. Other: There is an enhancing fluid collection in the subcutaneous tissues of the lower anterior abdominal wall with some loculations in the superior left region. Overall dimensions are 20.4 x 3.8 x 5.7 cm in largest transverse, AP and craniocaudad dimensions respectively. There is no soft tissue gas. There is surrounding subcutaneous body wall edema. There some surgical clips in the lower anterior abdominal wall. There is no abdominal wall hernia. There is no ascites. Musculoskeletal: Degenerative changes affect the spine. IMPRESSION: 1. Enhancing fluid collection in the subcutaneous tissues of the lower anterior abdominal wall measuring 20.4 x 3.8 x 5.7 cm. Findings are worrisome for abscess. 2. No acute localizing process in the abdomen or pelvis. Electronically Signed   By: Darliss Cheney M.D.   On: 01/13/2023 20:04    Scheduled Meds:  DULoxetine  60 mg Oral Daily   metroNIDAZOLE  500 mg Oral Q12H   Continuous Infusions:  vancomycin 750 mg (01/15/23 0604)     LOS: 2 days   Burnadette Pop, MD [facility] P10/31/2024, 1:21 PM

## 2023-01-15 NOTE — Plan of Care (Signed)

## 2023-01-16 DIAGNOSIS — T8140XA Infection following a procedure, unspecified, initial encounter: Secondary | ICD-10-CM | POA: Diagnosis not present

## 2023-01-16 LAB — CBC
HCT: 30.8 % — ABNORMAL LOW (ref 36.0–46.0)
Hemoglobin: 9.9 g/dL — ABNORMAL LOW (ref 12.0–15.0)
MCH: 31.3 pg (ref 26.0–34.0)
MCHC: 32.1 g/dL (ref 30.0–36.0)
MCV: 97.5 fL (ref 80.0–100.0)
Platelets: 370 10*3/uL (ref 150–400)
RBC: 3.16 MIL/uL — ABNORMAL LOW (ref 3.87–5.11)
RDW: 12.2 % (ref 11.5–15.5)
WBC: 8.3 10*3/uL (ref 4.0–10.5)
nRBC: 0 % (ref 0.0–0.2)

## 2023-01-16 MED ORDER — KETOROLAC TROMETHAMINE 15 MG/ML IJ SOLN
15.0000 mg | Freq: Four times a day (QID) | INTRAMUSCULAR | Status: DC | PRN
  Administered 2023-01-16 – 2023-01-17 (×2): 15 mg via INTRAVENOUS
  Filled 2023-01-16 (×2): qty 1

## 2023-01-16 MED ORDER — HYDROMORPHONE HCL 1 MG/ML IJ SOLN
1.0000 mg | Freq: Once | INTRAMUSCULAR | Status: AC
  Administered 2023-01-16: 1 mg via INTRAVENOUS
  Filled 2023-01-16: qty 1

## 2023-01-16 MED ORDER — OXYCODONE HCL 5 MG PO TABS
10.0000 mg | ORAL_TABLET | Freq: Four times a day (QID) | ORAL | Status: DC | PRN
  Administered 2023-01-17 (×2): 10 mg via ORAL
  Filled 2023-01-16 (×2): qty 2

## 2023-01-16 MED ORDER — HYDROMORPHONE HCL 1 MG/ML IJ SOLN
2.0000 mg | INTRAMUSCULAR | Status: DC | PRN
  Administered 2023-01-16 – 2023-01-17 (×4): 2 mg via INTRAVENOUS
  Filled 2023-01-16 (×4): qty 2

## 2023-01-16 NOTE — Care Management Important Message (Signed)
Important Message  Patient Details  Name: SEANNE CHIRICO MRN: [id number] Date of Birth: 1965/12/13   Important Message Given:  Yes - Medicare IM     Sherilyn Banker 01/16/2023, 1:58 PM

## 2023-01-16 NOTE — Progress Notes (Signed)
2 Days Post-Op  Subjective: Patient is a 57 year old female who underwent panniculectomy initially on 01/14/2023.  Patient developed redness, swelling and pain of the surgical site and went to the [facility] emergency room on 01/13/2023.  She was then transferred to [facility] for admission.  She underwent abdominal wound exploration, seroma cavity washout and drain placement with Dr. Ladona Ridgel on 01/14/2023.  Cultures were taken at that time.  Patient has since then been receiving IV antibiotics.  She is 2 days postop.  Per chart review, patient appears to not have spiked any fevers today.  Her WBC has come down from 11.3 -> 8.3.   Today, patient reports that she is still experiencing some pain and did experience a fever last night.  She states that she took some Tylenol and the fever subsided.  She also states that she had a headache at the time, and they went away with the Tylenol as well.  She reports that she has been eating and drinking without issue.  She denies any vomiting.  She does states she had 1 episode of nausea.  She reports she has been up and ambulating.  Gram stain from the culture taken at the time of surgery currently shows gram-positive cocci in pairs.  Culture shows rare Staphylococcus aureus, susceptibilities to follow.  Objective: Vital signs in last 24 hours: Temp:  [97.6 F (36.4 C)-100.9 F (38.3 C)] 97.6 F (36.4 C) (11/01 0724) Pulse Rate:  [86-124] 92 (11/01 0724) Resp:  [16-17] 16 (11/01 0724) BP: (111-135)/(64-77) 123/69 (11/01 0724) SpO2:  [93 %-100 %] 100 % (11/01 0724) Last BM Date : 01/13/23  Intake/Output from previous day: 10/31 0701 - 11/01 0700 In: 240 [P.O.:240] Out: 110 [Drains:110] Intake/Output this shift: No intake/output data recorded.  General appearance: alert, cooperative, and no distress Resp: Unlabored breathing, no respiratory distress GI: Upper abdomen is soft and nontender.  No lower abdomen still has significant erythema and what  appears to be worsening induration of the mons.  Incision appears to be intact with staples.  There is no active drainage from the incision.  JP drains are in place bilaterally and are functioning.  There is serosanguineous drainage in the bulbs.  The drainage does not appear to be cloudy.   Lab Results:     Latest Ref Rng & Units 01/16/2023    8:00 AM 01/15/2023    9:22 AM 01/14/2023    8:36 AM  CBC  WBC 4.0 - 10.5 K/uL 8.3  11.3  10.4   Hemoglobin 12.0 - 15.0 g/dL 9.9  62.1  30.8   Hematocrit 36.0 - 46.0 % 30.8  31.4  31.0   Platelets 150 - 400 K/uL 370  398  361     BMET Recent Labs    01/15/23 0922  NA 137  K 3.8  CL 104  CO2 24  GLUCOSE 155*  BUN 9  CREATININE 0.47  CALCIUM 8.5*   PT/INR No results for input(s): "LABPROT", "INR" in the last 72 hours. ABG No results for input(s): "PHART", "HCO3" in the last 72 hours.  Invalid input(s): "PCO2", "PO2"  Studies/Results: No results found.  Anti-infectives: Anti-infectives (From admission, onward)    Start     Dose/Rate Route Frequency Ordered Stop   01/15/23 0015  metroNIDAZOLE (FLAGYL) IVPB 500 mg        500 mg 100 mL/hr over 60 Minutes Intravenous  Once 01/14/23 2326 01/15/23 0152   01/14/23 1500  vancomycin (VANCOREADY) IVPB 750 mg/150 mL  750 mg 150 mL/hr over 60 Minutes Intravenous Every 8 hours 01/14/23 1414     01/14/23 1500  ceFEPIme (MAXIPIME) 2 g in sodium chloride 0.9 % 100 mL IVPB  Status:  Discontinued        2 g 200 mL/hr over 30 Minutes Intravenous Every 8 hours 01/14/23 1414 01/15/23 0954   01/14/23 1000  metroNIDAZOLE (FLAGYL) tablet 500 mg  Status:  Discontinued        500 mg Oral Every 12 hours 01/14/23 0723 01/15/23 1325   01/14/23 0800  metroNIDAZOLE (FLAGYL) IVPB 500 mg  Status:  Discontinued        500 mg 100 mL/hr over 60 Minutes Intravenous Every 12 hours 01/14/23 0707 01/14/23 0723       Assessment/Plan: s/p Procedure(s): Abdominal wall wound washout with drain  placement Plan: -Given patient's continued pain and induration and erythema, recommend patient continue with IV antibiotics.  Discussed this with patient, patient expressed understanding. -We will continue to follow cultures -Medical management pain control per primary -Plastics will continue to follow along  Discussed objective findings and plan with Dr. Ladona Ridgel.  LOS: 3 days    Laurena Spies, PA-C 01/16/2023

## 2023-01-16 NOTE — Care Management Important Message (Signed)
Important Message  Patient Details  Name: Megan Jimenez MRN: [id number] Date of Birth: 09-02-1965   Important Message Given:        Sherilyn Banker 01/16/2023, 1:58 PM

## 2023-01-16 NOTE — Progress Notes (Signed)
PROGRESS NOTE  Megan Jimenez  EXB:[id number] DOB: [birth year] DOA: 01/13/2023 PCP: Jacky Kindle, FNP   Brief Narrative: Patient is a 57 year old female with history of anxiety, arthritis, asthma, bipolar disorder, recent panniculectomy on 12/22/2022 presented with abdominal pain, erythema at panniculectomy incision site, purulent drainage, fever, generalized weakness.[name]  On presentation, she was mildly tachycardic, afebrile.  Blood pressure stable.  Labs showed leukocytosis of 12.8.  CT imaging showed large subcutaneous fluid collection concerning for abscess.  Plastic surgery consulted, plan for I&D.  Currently on broad-spectrum antibiotics.  Status post abdominal wound exploration, seroma cavity washout and drainage placement on 10/30 by plastic surgery.  Wound cultures showing Staph aureus  Assessment & Plan:  Principal Problem:   Post op infection Active Problems:   Anxiety   Acute asthma exacerbation   Prediabetes   Bipolar disorder (HCC)   SIRS (systemic inflammatory response syndrome) (HCC)   Sepsis secondary to surgical wound infection: History of recent panniculectomy on 10/7 /24.  Presented with fever, abdominal pain, drainage, erythema at the incision site.  CT showed large subcutaneous fluid collection concerning for abscess.  On presentation, she was tachycardic, had leukocytosis.  Started on broad spectrum antibiotics including vancomycin, cefepime, metronidazole.   Leukocytosis has resolved. Status post abdominal wound exploration, seroma cavity washout and drainage placement on 10/30 by plastic surgery.  Wound culture showing rare Staphylococcus aureus, will follow-up the final culture report She is afebrile this morning, leukocytosis resolved.  Asthma exacerbation: History of asthma.  Mild wheezing on presentation.  Chest x-ray does not show any pneumonia.  Continue bronchodilators as needed.  On room air now  Bipolar disorder/anxiety: Takes Cymbalta, Xanax         DVT prophylaxis:SCDs Start: 01/14/23 0704     Code Status: Full Code  Family Communication: None at bedside  Patient status:Inpatient  Patient is from :home  Anticipated discharge NU:UVOZ  Estimated DC date:after plastic surgery clearance, likely tomorrow   Consultants: Plastic surgery  Procedures: Abdominal wound exploration  Antimicrobials:  Anti-infectives (From admission, onward)    Start     Dose/Rate Route Frequency Ordered Stop   01/15/23 0015  metroNIDAZOLE (FLAGYL) IVPB 500 mg        500 mg 100 mL/hr over 60 Minutes Intravenous  Once 01/14/23 2326 01/15/23 0152   01/14/23 1500  vancomycin (VANCOREADY) IVPB 750 mg/150 mL        750 mg 150 mL/hr over 60 Minutes Intravenous Every 8 hours 01/14/23 1414     01/14/23 1500  ceFEPIme (MAXIPIME) 2 g in sodium chloride 0.9 % 100 mL IVPB  Status:  Discontinued        2 g 200 mL/hr over 30 Minutes Intravenous Every 8 hours 01/14/23 1414 01/15/23 0954   01/14/23 1000  metroNIDAZOLE (FLAGYL) tablet 500 mg  Status:  Discontinued        500 mg Oral Every 12 hours 01/14/23 0723 01/15/23 1325   01/14/23 0800  metroNIDAZOLE (FLAGYL) IVPB 500 mg  Status:  Discontinued        500 mg 100 mL/hr over 60 Minutes Intravenous Every 12 hours 01/14/23 0707 01/14/23 0723       Subjective: Patient seen and examined the bedside today.  Afebrile this morning.  She had a mild grade fever yesterday evening.  She felt bad yesterday but she looks comfortable this morning.  Still has some tenderness around the incision site on the lower abdomen with erythematous appearance.  Objective: Vitals:   01/15/23 1930 01/15/23 2034  01/16/23 0333 01/16/23 0724  BP: 135/64 115/66 111/66 123/69  Pulse: (!) 124 (!) 108 86 92  Resp: 16 16 16 16   Temp: (!) 100.9 F (38.3 C) 98.6 F (37 C) 97.7 F (36.5 C) 97.6 F (36.4 C)  TempSrc: Oral Oral  Oral  SpO2: 94% 97% 99% 100%  Weight:      Height:        Intake/Output Summary (Last 24 hours) at  01/16/2023 1040 Last data filed at 01/16/2023 0341 Gross per 24 hour  Intake --  Output 110 ml  Net -110 ml   Filed Weights   01/13/23 2315  Weight: 85.7 kg    Examination:   General exam: Overall comfortable, not in distress HEENT: PERRL Respiratory system:  no wheezes or crackles  Cardiovascular system: S1 & S2 heard, RRR.  Gastrointestinal system: Abdomen is nondistended, soft .  Bowel sounds present.  Abdomen wound covered with dressing, drain, erythematous/edematous  skin, tenderness around the incision site. Central nervous system: Alert and oriented Extremities: No edema, no clubbing ,no cyanosis Skin: No rashes, no ulcers,no icterus     Data Reviewed: I have personally reviewed following labs and imaging studies  CBC: Recent Labs  Lab 01/13/23 1150 01/14/23 0836 01/15/23 0922 01/16/23 0800  WBC 12.8* 10.4 11.3* 8.3  HGB 11.4* 10.1* 10.2* 9.9*  HCT 34.3* 31.0* 31.4* 30.8*  MCV 95.8 95.7 97.5 97.5  PLT 392 361 398 370   Basic Metabolic Panel: Recent Labs  Lab 01/13/23 1150 01/15/23 0922  NA 136 137  K 4.1 3.8  CL 99 104  CO2 27 24  GLUCOSE 98 155*  BUN 10 9  CREATININE 0.55 0.47  CALCIUM 9.2 8.5*     Recent Results (from the past 240 hour(s))  Blood culture (routine x 2)     Status: None (Preliminary result)   Collection Time: 01/13/23  3:48 PM   Specimen: BLOOD  Result Value Ref Range Status   Specimen Description BLOOD RIGHT ANTECUBITAL  Final   Special Requests   Final    BOTTLES DRAWN AEROBIC AND ANAEROBIC Blood Culture results may not be optimal due to an excessive volume of blood received in culture bottles   Culture   Final    NO GROWTH 3 DAYS Performed at [facility], [address] [place], [place] [address]    Report Status PENDING  Incomplete  Blood culture (routine x 2)     Status: None (Preliminary result)   Collection Time: 01/13/23  4:10 PM   Specimen: BLOOD  Result Value Ref Range Status   Specimen Description  BLOOD BLOOD LEFT FOREARM  Final   Special Requests   Final    BOTTLES DRAWN AEROBIC AND ANAEROBIC Blood Culture adequate volume   Culture   Final    NO GROWTH 3 DAYS Performed at [facility], [address] [place], [place] [address]    Report Status PENDING  Incomplete  Aerobic Culture w Gram Stain (superficial specimen)     Status: None (Preliminary result)   Collection Time: 01/14/23  5:06 PM   Specimen: Wound  Result Value Ref Range Status   Specimen Description WOUND LEFT LOWER ABDOMEN  Final   Special Requests PT ON VANC  Final   Gram Stain   Final    FEW WBC PRESENT, PREDOMINANTLY PMN RARE GRAM POSITIVE COCCI IN PAIRS    Culture   Final    RARE STAPHYLOCOCCUS AUREUS SUSCEPTIBILITIES TO FOLLOW Performed at [facility], [address], [place], [place] [address]    Report Status PENDING  Incomplete     Radiology Studies: No results found.  Scheduled Meds:  DULoxetine  60 mg Oral Daily   Continuous Infusions:  vancomycin 750 mg (01/16/23 0640)     LOS: 3 days   Burnadette Pop, MD [facility] P11/03/2022, 10:40 AM

## 2023-01-16 NOTE — Progress Notes (Signed)
Order obtained and given for one time dose dilaudid. Order also obtained to increase dilaudid dose due to severe pain.

## 2023-01-16 NOTE — Plan of Care (Signed)

## 2023-01-16 NOTE — Progress Notes (Signed)
Megan Jimenez is 25 days post op from a panniculectomy and POD #2 from return to the OR for drainage of an infected seroma. She is feeling better but still uncomfortable. She is ambulating and tolerating a diet.  Tmax was 100.9. Her last WBC was 8.3.  On examination she still has extensive erythema over the mons and around the incision. Her drain out put is serosanguinous. The surgical incision is intact and dry.  Cultures grew rare Staph, sensitivities are still pending. She remains on Vancomycin.  S/P panniculectomy complicated by an infected seroma: I am concerned that her erythema has not improved more. Her drains are functioning so I do not believe she has any additional fluid collection. Would like to keep her one more day for IV antibiotics. Will evaluate her tomorrow morning for possible discharge at that time.  May shower.

## 2023-01-16 NOTE — Progress Notes (Signed)
   01/16/23 1024  Mobility  Activity Ambulated with assistance in hallway  Level of Assistance Standby assist, set-up cues, supervision of patient - no hands on  Assistive Device Other (Comment) (IV Pole)  Distance Ambulated (ft) 700 ft  Activity Response Tolerated fair  Mobility Referral Yes  $Mobility charge 1 Mobility  Mobility Specialist Start Time (ACUTE ONLY) 1009  Mobility Specialist Stop Time (ACUTE ONLY) 1024  Mobility Specialist Time Calculation (min) (ACUTE ONLY) 15 min   Mobility Specialist: Progress Note  Pt agreeable to mobility session - received Standing in room. Required SV using IV Pole. C/o abd pain rated 5/10 . Returned to chair with all needs met - call bell within reach.   Barnie Mort, BS Mobility Specialist Please contact via SecureChat or Rehab office at [phone].

## 2023-01-17 ENCOUNTER — Other Ambulatory Visit (HOSPITAL_COMMUNITY): Payer: Self-pay

## 2023-01-17 DIAGNOSIS — T8140XA Infection following a procedure, unspecified, initial encounter: Secondary | ICD-10-CM | POA: Diagnosis not present

## 2023-01-17 LAB — AEROBIC CULTURE W GRAM STAIN (SUPERFICIAL SPECIMEN)

## 2023-01-17 MED ORDER — BISACODYL 10 MG RE SUPP
10.0000 mg | Freq: Once | RECTAL | Status: AC
  Administered 2023-01-17: 10 mg via RECTAL
  Filled 2023-01-17: qty 1

## 2023-01-17 MED ORDER — POLYETHYLENE GLYCOL 3350 17 G PO PACK: 17.0000 g | PACK | Freq: Every day | ORAL | 0 refills | Status: DC

## 2023-01-17 MED ORDER — OXYCODONE HCL 5 MG PO TABS
5.0000 mg | ORAL_TABLET | Freq: Three times a day (TID) | ORAL | 0 refills | Status: AC | PRN
  Filled 2023-01-17: qty 15, 5d supply, fill #0

## 2023-01-17 MED ORDER — OXYCODONE HCL 5 MG PO TABS: 5.0000 mg | ORAL_TABLET | Freq: Three times a day (TID) | ORAL | 0 refills | Status: DC | PRN

## 2023-01-17 MED ORDER — AMOXICILLIN-POT CLAVULANATE 875-125 MG PO TABS: 1.0000 | ORAL_TABLET | Freq: Two times a day (BID) | ORAL | 0 refills | Status: DC

## 2023-01-17 MED ORDER — SENNOSIDES-DOCUSATE SODIUM 8.6-50 MG PO TABS
1.0000 | ORAL_TABLET | Freq: Two times a day (BID) | ORAL | Status: DC
  Administered 2023-01-17: 1 via ORAL
  Filled 2023-01-17: qty 1

## 2023-01-17 MED ORDER — POLYETHYLENE GLYCOL 3350 17 G PO PACK
17.0000 g | PACK | Freq: Every day | ORAL | Status: DC
  Administered 2023-01-17: 17 g via ORAL
  Filled 2023-01-17: qty 1

## 2023-01-17 MED ORDER — SENNOSIDES-DOCUSATE SODIUM 8.6-50 MG PO TABS: 1.0000 | ORAL_TABLET | Freq: Two times a day (BID) | ORAL | 0 refills | Status: AC

## 2023-01-17 NOTE — Discharge Summary (Signed)
Megan Jimenez is a 57 year old female who underwent a panniculectomy 26 days ago.  She was seen at [facility] with because of complaints of new onset pain and erythema of her abdominal wall.  She was noted to be tachycardic and febrile with an elevated white count and was transferred to [facility] for management.  A CT scan was performed which showed a large fluid collection with a questionable rim enhancement.  I was notified of the findings and the patient's admission and made arrangements to take her to the operating room.  On October 30 the patient was taken to the operating room where I opened the previous incision and washed out her panniculectomy surgical site.  There was a moderate amount of clear serous fluid that was removed at the time of surgery.  Cultures were obtained.  The surgical wound was irrigated and 2 new 19 French round drains placed.  The patient tolerated the procedure well and was transferred back to her room.  After surgery the patient had 1 fever to the level of 100.9 but is remained essentially afebrile since that point she has complained intermittently of increasing pain and yesterday still had significant erythema of the abdominal wall.  She was kept on IV vancomycin due to a culture that showed initially gram-positive cocci and later rare Staphylococcus.  Pain management continued to be a problem until last night.  This morning she is doing much better.  She has remained afebrile.  Her last white blood cell count yesterday morning was 8.3.  On physical exam the erythema is markedly decreased.  She has much less pain to palpation over the lower abdominal wall and mons pubis.  She was up ambulating when I came in to examine her.  She is tolerating a regular diet.  He is voiding without difficulty.  Drain output remains serosanguineous.   I believe the patient can be discharged home today.  She will need to keep a compressive garment over the surgical site for  an additional 6 weeks.  The dressings around the drain sites should be changed prior to her discharge and then she should change the dressings daily.  Gauze may be used inside the compressive garment for comfort.  She should continue to monitor and record drain output.  She will be discharged home on Augmentin and recommendations to use Tylenol or Motrin for pain control.  She will have a small amount of oxycodone prescribed in case she has severe pain.  She is instructed to call my office on Monday at [phone] to schedule an appointment for next Thursday or Friday.  She is also instructed to call the office immediately if she has any questions or concerns.

## 2023-01-17 NOTE — Plan of Care (Signed)
  Problem: Education: Goal: Knowledge of General Education information will improve Description: Including pain rating scale, medication(s)/side effects and non-pharmacologic comfort measures Outcome: Progressing   Problem: Nutrition: Goal: Adequate nutrition will be maintained Outcome: Progressing   Problem: Coping: Goal: Level of anxiety will decrease Outcome: Progressing   Problem: Elimination: Goal: Will not experience complications related to bowel motility Outcome: Progressing   Problem: Pain Management: Goal: General experience of comfort will improve Outcome: Progressing

## 2023-01-17 NOTE — Plan of Care (Signed)

## 2023-01-17 NOTE — Progress Notes (Signed)
PROGRESS NOTE  Megan Jimenez  NGE:[id number] DOB: [birth year] DOA: 01/13/2023 PCP: Jacky Kindle, FNP   Brief Narrative: Patient is a 57 year old female with history of anxiety, arthritis, asthma, bipolar disorder, recent panniculectomy on 12/22/2022 presented with abdominal pain, erythema at panniculectomy incision site, purulent drainage, fever, generalized weakness.Megan Jimenez  On presentation, she was mildly tachycardic, afebrile.  Blood pressure stable.  Labs showed leukocytosis of 12.8.  CT imaging showed large subcutaneous fluid collection concerning for abscess.  Plastic surgery consulted, plan for I&D.  Currently on broad-spectrum antibiotics.  Status post abdominal wound exploration, seroma cavity washout and drainage placement on 10/30 by plastic surgery.  Wound cultures showed pansensitive staph aureus.  Patient is afebrile, no leukocytosis today.  She feels better.  Plastic surgery discharging her today to  home  Assessment & Plan:  Principal Problem:   Post op infection Active Problems:   Anxiety   Acute asthma exacerbation   Prediabetes   Bipolar disorder (HCC)   SIRS (systemic inflammatory response syndrome) (HCC)   Sepsis secondary to surgical wound infection: History of recent panniculectomy on 10/7 /24.  Presented with fever, abdominal pain, drainage, erythema at the incision site.  CT showed large subcutaneous fluid collection concerning for abscess.  On presentation, she was tachycardic, had leukocytosis.  Started on broad spectrum antibiotics including vancomycin, cefepime, metronidazole.   Leukocytosis has resolved. Status post abdominal wound exploration, seroma cavity washout and drainage placement on 10/30 by plastic surgery. She is afebrile this morning, leukocytosis resolved.  She feels better and tolerating diet.  Wound culture showed pansensitive Staph aureus.  Currently being discharged on Augmentin.  Asthma exacerbation: History of asthma.  Mild wheezing on  presentation.  Chest x-ray does not show any pneumonia.  Continue bronchodilators as needed.  On room air now  Bipolar disorder/anxiety: Takes Cymbalta, Xanax  Constipation: Continue bowel regimen at home        DVT prophylaxis:SCDs Start: 01/14/23 0704     Code Status: Full Code  Family Communication: None at bedside  Patient status:Inpatient  Patient is from :home  Anticipated discharge MW:NUUV  Estimated DC date:today   Consultants: Plastic surgery  Procedures: Abdominal wound exploration  Antimicrobials:  Anti-infectives (From admission, onward)    Start     Dose/Rate Route Frequency Ordered Stop   01/17/23 0000  amoxicillin-clavulanate (AUGMENTIN) 875-125 MG tablet        1 tablet Oral 2 times daily 01/17/23 1022     01/15/23 0015  metroNIDAZOLE (FLAGYL) IVPB 500 mg        500 mg 100 mL/hr over 60 Minutes Intravenous  Once 01/14/23 2326 01/15/23 0152   01/14/23 1500  vancomycin (VANCOREADY) IVPB 750 mg/150 mL        750 mg 150 mL/hr over 60 Minutes Intravenous Every 8 hours 01/14/23 1414     01/14/23 1500  ceFEPIme (MAXIPIME) 2 g in sodium chloride 0.9 % 100 mL IVPB  Status:  Discontinued        2 g 200 mL/hr over 30 Minutes Intravenous Every 8 hours 01/14/23 1414 01/15/23 0954   01/14/23 1000  metroNIDAZOLE (FLAGYL) tablet 500 mg  Status:  Discontinued        500 mg Oral Every 12 hours 01/14/23 0723 01/15/23 1325   01/14/23 0800  metroNIDAZOLE (FLAGYL) IVPB 500 mg  Status:  Discontinued        500 mg 100 mL/hr over 60 Minutes Intravenous Every 12 hours 01/14/23 0707 01/14/23 0723  Subjective: Patient seen and examined the bedside today.  She feels better.  She was eating her breakfast.  No bowel movements  for last few days.  No complaint of pain around surgical wound today.  Objective: Vitals:   01/16/23 1522 01/16/23 2028 01/17/23 0620 01/17/23 0730  BP: 110/69 118/67 119/67 125/72  Pulse: 92 85 79 77  Resp: 16 18 18 16   Temp: 98.3 F (36.8  C) 97.8 F (36.6 C) (!) 97.4 F (36.3 C) 97.8 F (36.6 C)  TempSrc: Oral   Oral  SpO2: 99% 99% 94% 99%  Weight:      Height:        Intake/Output Summary (Last 24 hours) at 01/17/2023 1050 Last data filed at 01/17/2023 8469 Gross per 24 hour  Intake 1680 ml  Output 50 ml  Net 1630 ml   Filed Weights   01/13/23 2315  Weight: 85.7 kg    Examination:  General exam: Overall comfortable, not in distress HEENT: PERRL Respiratory system:  no wheezes or crackles  Cardiovascular system: S1 & S2 heard, RRR.  Gastrointestinal system: Abdomen is nondistended, soft and nontender. Abdomen wound covered with dressing, drain, erythematous/edematous  skin around the surgical incision wound Central nervous system: Alert and oriented Extremities: No edema, no clubbing ,no cyanosis Skin: No rashes, no ulcers,no icterus     Data Reviewed: I have personally reviewed following labs and imaging studies  CBC: Recent Labs  Lab 01/13/23 1150 01/14/23 0836 01/15/23 0922 01/16/23 0800  WBC 12.8* 10.4 11.3* 8.3  HGB 11.4* 10.1* 10.2* 9.9*  HCT 34.3* 31.0* 31.4* 30.8*  MCV 95.8 95.7 97.5 97.5  PLT 392 361 398 370   Basic Metabolic Panel: Recent Labs  Lab 01/13/23 1150 01/15/23 0922  NA 136 137  K 4.1 3.8  CL 99 104  CO2 27 24  GLUCOSE 98 155*  BUN 10 9  CREATININE 0.55 0.47  CALCIUM 9.2 8.5*     Recent Results (from the past 240 hour(s))  Blood culture (routine x 2)     Status: None (Preliminary result)   Collection Time: 01/13/23  3:48 PM   Specimen: BLOOD  Result Value Ref Range Status   Specimen Description BLOOD RIGHT ANTECUBITAL  Final   Special Requests   Final    BOTTLES DRAWN AEROBIC AND ANAEROBIC Blood Culture results may not be optimal due to an excessive volume of blood received in culture bottles   Culture   Final    NO GROWTH 4 DAYS Performed at [facility], [address] [place], [place] [address]    Report Status PENDING  Incomplete  Blood  culture (routine x 2)     Status: None (Preliminary result)   Collection Time: 01/13/23  4:10 PM   Specimen: BLOOD  Result Value Ref Range Status   Specimen Description BLOOD BLOOD LEFT FOREARM  Final   Special Requests   Final    BOTTLES DRAWN AEROBIC AND ANAEROBIC Blood Culture adequate volume   Culture   Final    NO GROWTH 4 DAYS Performed at [facility], [address], [place], [place] [address]    Report Status PENDING  Incomplete  Aerobic Culture w Gram Stain (superficial specimen)     Status: None (Preliminary result)   Collection Time: 01/14/23  5:06 PM   Specimen: Wound  Result Value Ref Range Status   Specimen Description WOUND LEFT LOWER ABDOMEN  Final   Special Requests PT ON VANC  Final   Gram  Stain   Final    FEW WBC PRESENT, PREDOMINANTLY PMN RARE GRAM POSITIVE COCCI IN PAIRS Performed at [facility], [address], [place], [place] [address]    Culture RARE STAPHYLOCOCCUS AUREUS  Final   Report Status PENDING  Incomplete   Organism ID, Bacteria STAPHYLOCOCCUS AUREUS  Final      Susceptibility   Staphylococcus aureus - MIC*    CIPROFLOXACIN <=0.5 SENSITIVE Sensitive     ERYTHROMYCIN <=0.25 SENSITIVE Sensitive     GENTAMICIN <=0.5 SENSITIVE Sensitive     OXACILLIN 0.5 SENSITIVE Sensitive     TETRACYCLINE <=1 SENSITIVE Sensitive     VANCOMYCIN <=0.5 SENSITIVE Sensitive     TRIMETH/SULFA <=10 SENSITIVE Sensitive     CLINDAMYCIN <=0.25 SENSITIVE Sensitive     RIFAMPIN <=0.5 SENSITIVE Sensitive     Inducible Clindamycin NEGATIVE Sensitive     LINEZOLID 2 SENSITIVE Sensitive     * RARE STAPHYLOCOCCUS AUREUS     Radiology Studies: No results found.  Scheduled Meds:  DULoxetine  60 mg Oral Daily   polyethylene glycol  17 g Oral Daily   senna-docusate  1 tablet Oral BID   Continuous Infusions:  vancomycin 750 mg (01/17/23 0612)     LOS: 4 days   Burnadette Pop, MD [facility] P11/04/2022, 10:50 AM

## 2023-01-17 NOTE — Plan of Care (Signed)
  Problem: Education: Goal: Knowledge of General Education information will improve Description: Including pain rating scale, medication(s)/side effects and non-pharmacologic comfort measures 01/17/2023 0720 by Arlyss Gandy, LPN Outcome: Progressing 01/17/2023 0716 by Arlyss Gandy, LPN Outcome: Progressing   Problem: Health Behavior/Discharge Planning: Goal: Ability to manage health-related needs will improve 01/17/2023 0720 by Arlyss Gandy, LPN Outcome: Progressing 01/17/2023 0716 by Arlyss Gandy, LPN Outcome: Progressing   Problem: Clinical Measurements: Goal: Ability to maintain clinical measurements within normal limits will improve 01/17/2023 0720 by Arlyss Gandy, LPN Outcome: Progressing 01/17/2023 0716 by Arlyss Gandy, LPN Outcome: Progressing Goal: Will remain free from infection Outcome: Progressing Goal: Diagnostic test results will improve Outcome: Progressing Goal: Respiratory complications will improve Outcome: Progressing Goal: Cardiovascular complication will be avoided Outcome: Progressing   Problem: Activity: Goal: Risk for activity intolerance will decrease Outcome: Progressing   Problem: Nutrition: Goal: Adequate nutrition will be maintained Outcome: Progressing   Problem: Coping: Goal: Level of anxiety will decrease Outcome: Progressing   Problem: Elimination: Goal: Will not experience complications related to bowel motility Outcome: Progressing Goal: Will not experience complications related to urinary retention Outcome: Progressing   Problem: Pain Management: Goal: General experience of comfort will improve Outcome: Progressing   Problem: Safety: Goal: Ability to remain free from injury will improve Outcome: Progressing   Problem: Skin Integrity: Goal: Risk for impaired skin integrity will decrease Outcome: Progressing

## 2023-01-18 LAB — CULTURE, BLOOD (ROUTINE X 2)
Culture: NO GROWTH
Culture: NO GROWTH
Special Requests: ADEQUATE

## 2023-01-19 ENCOUNTER — Telehealth: Payer: Self-pay

## 2023-01-19 NOTE — Telephone Encounter (Signed)
-----   Message from [place] J sent at 01/14/2023 11:11 AM EDT ----- Regarding: RE: Drainage Just tried to call and schedule, patient did not answer. Left vm for her to call back. ----- Message ----- From: Angelita Ingles, CMA Sent: 01/14/2023  10:46 AM EDT To: [facility] Subject: FW: Drainage                                    ----- Message ----- From: Blanch Media Sent: 01/13/2023   4:46 PM EDT To: Angelita Ingles, CMA; Sherol Dade, CMA; # Subject: RE: Drainage                                   Patient can come in tomorrow 10/30 and see a provider.   Thanks! Alan Ripper ----- Message ----- From: Angelita Ingles, CMA Sent: 01/13/2023   4:33 PM EDT To: Laurena Spies, PA-C Subject: Drainage                                       Pt called stating she has a very hard area where the drain was from her panni 2-3 weeks ago. Some drainage. Wanted to know what she should do, I see she went to the ED today, not sure what time as there are no notes I should see. Not sure if you want to call her or let nature take its course.

## 2023-01-19 NOTE — Telephone Encounter (Signed)
Left another message for patient. She does have an appointment on the 6th.

## 2023-01-21 ENCOUNTER — Encounter: Payer: Medicare Other | Admitting: Surgical

## 2023-01-22 NOTE — Progress Notes (Signed)
Patient is a pleasant 57 year old female s/p panniculectomy performed 12/22/2022 by Dr. Ladona Ridgel complicated by infection requiring hospitalization and abdominal wound washout and drain placement 01/14/2023 and hospital discharge 01/17/2023 who presents to clinic for postoperative follow-up.  Reviewed discharge summary and she was discharged home with Augmentin 01/17/2023.  Today, patient is doing well.  She states that she only has to take Tylenol occasionally for any residual discomfort and that her pain is effectively gone.  She states that the redness and swelling has subsided.  Drain output has been consistent and minimal, approximately 25 to 30 cc daily from each side.  I had already spoken with Dr. Ladona Ridgel in anticipation of this appointment and plan is to leave the drains and over the weekend with consideration for removal next week.  The patient was entirely understanding and agreeable with this plan and understands that we are to move forward cautiously.  However, she states that she is feeling exceedingly well and denies any fevers, leg swelling, swelling of the mons pubis area, chest pain, or any other concerns/complaints.  On exam, abdomen is soft and nondistended.  Nontender to palpation.  Stapled closure.  Drains are intact and functional, normal-appearing output in bulbs and tubes bilaterally.  Charged.  No significant erythema or induration on exam.  Significantly improved compared to previous images in the chart.  She is doing well and suspected she could have one of her drains removed on Monday, as discussed with Dr. Ladona Ridgel.  Will have her return then.  She will call the office should she have any questions or concerns over the weekend.  Otherwise, recommend continued activity modifications and compressive garments.  She has approximately 3 days left of her Augmentin.  Picture(s) obtained of the patient and placed in the chart were with the patient's or guardian's permission.

## 2023-01-23 ENCOUNTER — Encounter: Payer: Self-pay | Admitting: Physician Assistant

## 2023-01-23 ENCOUNTER — Ambulatory Visit (INDEPENDENT_AMBULATORY_CARE_PROVIDER_SITE_OTHER): Payer: Medicare Other | Admitting: Physician Assistant

## 2023-01-23 ENCOUNTER — Other Ambulatory Visit: Payer: Medicare Other | Admitting: Pharmacist

## 2023-01-23 ENCOUNTER — Telehealth: Payer: Self-pay | Admitting: Pharmacist

## 2023-01-23 VITALS — BP 136/89 | HR 92 | Ht 70.0 in | Wt 195.0 lb

## 2023-01-23 DIAGNOSIS — Z9889 Other specified postprocedural states: Secondary | ICD-10-CM | POA: Diagnosis not present

## 2023-01-23 NOTE — Progress Notes (Signed)
   01/23/2023  Patient ID: Megan Jimenez, female   DOB: Mar 03, 1966, 57 y.o.   MRN: [id number]  Called patient for our follow-up call scheduled at [facility] today. Left voicemail requesting call back at her earliest convenience.    Marlowe Aschoff, PharmD [facility] Phone Number: [phone]

## 2023-01-25 ENCOUNTER — Other Ambulatory Visit: Payer: Self-pay | Admitting: Family Medicine

## 2023-01-25 DIAGNOSIS — Z7689 Persons encountering health services in other specified circumstances: Secondary | ICD-10-CM

## 2023-01-26 ENCOUNTER — Encounter: Payer: Self-pay | Admitting: Student

## 2023-01-26 ENCOUNTER — Ambulatory Visit (INDEPENDENT_AMBULATORY_CARE_PROVIDER_SITE_OTHER): Payer: Medicare Other | Admitting: Student

## 2023-01-26 VITALS — BP 153/94 | HR 80 | Ht 70.0 in | Wt 191.0 lb

## 2023-01-26 DIAGNOSIS — Z9889 Other specified postprocedural states: Secondary | ICD-10-CM | POA: Diagnosis not present

## 2023-01-26 NOTE — Progress Notes (Signed)
Patient is a pleasant 57 year old female s/p panniculectomy performed 12/22/2022 by Dr. Ladona Ridgel complicated by infection requiring hospitalization and abdominal wound washout and drain placement 01/14/2023 and hospital discharge 01/17/2023 who presents to clinic for postoperative follow-up.  Patient is 12 days postop.     Patient was last seen in the clinic on 01/23/2023.  At this visit, patient stated that she had some residual discomfort but that her pain was effectively gone.  She reported that her redness and swelling had subsided as well.  Her drain output had been consistent and minimal, approximately 25 to 30 cc daily from each side.  On exam, her abdomen was soft and nondistended.  It is nontender to palpation.  There is a stapled closure.  Drains were intact and functional, normal-appearing output in the bulbs and tubes bilaterally.  Plan is for patient to return in a few days for possible drain removal.  Today, patient reports she is doing really well.  She states that she has almost no pain.  She denies any fevers or chills.  Reports redness and erythema has significantly improved.  Reports her drain output has been about less than 5 cc in the left drain and less than 25 cc in the right drain.  Denies any other issues or concerns.  Chaperone present on exam.  On exam, patient is sitting upright in no acute distress.  Abdomen is soft and nontender.  There is still some residual mild erythema noted near the incision/over the mons.  There are no cellulitic changes to skin.  Is nontender to palpation.  There are no fluid collections palpated on exam.  Incision is intact with staples.  JP drains are in place bilaterally and are functioning.  There is minimal serous drainage in each of the bulbs.  Left drain was removed without any difficulty.  Patient tolerated well.  Instructed patient to continue compression at all times.  Discussed with patient that if her drain output from the right side remains  less than 30 cc per 24 hours by Wednesday or Thursday, she can let us know and have the drain removed.  Otherwise we will have her come back next week for reevaluation, possible drain removal, and removal of staples.  Patient expressed understanding.  Instructed patient to call in the meantime she has any questions or concerns about anything.

## 2023-01-27 ENCOUNTER — Telehealth: Payer: Self-pay | Admitting: Pharmacist

## 2023-01-27 NOTE — Progress Notes (Signed)
   01/27/2023  Patient ID: Megan Jimenez, female   DOB: Aug 27, 1965, 57 y.o.   MRN: [id number]  Called patient for a second follow-up attempt for our 01/23/23 follow-up call. Left voicemail requesting a call back at her earliest convenience.   Marlowe Aschoff, PharmD [facility] Phone Number: [phone]

## 2023-01-29 ENCOUNTER — Encounter: Payer: Self-pay | Admitting: Student

## 2023-01-29 ENCOUNTER — Ambulatory Visit (INDEPENDENT_AMBULATORY_CARE_PROVIDER_SITE_OTHER): Payer: Medicare Other | Admitting: Student

## 2023-01-29 VITALS — BP 167/98 | HR 89 | Ht 70.0 in | Wt 190.0 lb

## 2023-01-29 DIAGNOSIS — Z9889 Other specified postprocedural states: Secondary | ICD-10-CM | POA: Diagnosis not present

## 2023-01-29 NOTE — Progress Notes (Signed)
Patient is a pleasant 57 year old female s/p panniculectomy performed 12/22/2022 by Dr. Ladona Ridgel complicated by infection requiring hospitalization and abdominal wound washout and drain placement 01/14/2023 and hospital discharge 01/17/2023 who presents to clinic for postoperative follow-up.  Patient is 2 weeks postop.          Patient was most recently seen in the clinic on 01/26/2023.  At this visit, patient was doing well.  She reported that her drain output had been very minimal.  Abdomen was soft and nontender.  There was some residual mild erythema noted over the incision/mons.  Left drain was removed without any difficulty.  Plan is for patient to continue compression at all times.  Today, patient reports she is doing well.  She denies any new concerns.  She reports that her drain output has been 20 to 25 cc for the past few days.  Chaperone present on exam.  On exam, patient sitting upright in no acute distress.  Abdomen is soft and nontender.  There is no overlying erythema.  No obvious fluid collections palpated on exam.  Incision appears to be intact with staples to the left side, right side incision appears to be well-healed.  Erythema has improved.  No induration noted on exam.  JP drain is in place on the right side with minimal serous drainage in the bulb.  Right drain was removed without any difficulty.  Discussed with patient that she should continue compression at all times.  We will have her come back to the clinic next week for staple removal.  Instructed to call in the meantime she has new questions or concerns about anything.

## 2023-01-30 NOTE — Progress Notes (Signed)
Patient is a pleasant 57 year old female s/p panniculectomy performed 12/22/2022 by Dr. Ladona Ridgel complicated by infection requiring hospitalization and abdominal wound washout and drain placement 01/14/2023 and hospital discharge 01/17/2023 who presents to clinic for postoperative follow-up.   She was last seen here in clinic 01/29/2023.  At that time, drain was removed without complication or difficulty.  Plan was for staple removal at next encounter.  Today, patient is doing well.  She feels prepared for removal of the staples.  She does endorse some overgrowth of skin over the staples given chronicity since placement.  She denies any fevers, chest pain, difficulty breathing, leg swelling, or other concerns.  She is ambulatory, tolerating p.o. intake without difficulty, voiding.  On exam, abdomen is soft, nondistended.  Incision well-healed.  All of the visible staples are removed without complication or difficulty.  No erythema or other overlying skin changes.  No evidence concerning for infection.  Recommending Vaseline to her incision throughout including where her drain tube insertion sites are given that they appear dry today.  After 1 week, she can transition to silicone scar gels twice daily x 3 months.  Will have her follow-up for likely final postoperative encounter in 1 to 2 weeks.  Will obtain final postoperative photos at that time.

## 2023-02-02 ENCOUNTER — Ambulatory Visit (INDEPENDENT_AMBULATORY_CARE_PROVIDER_SITE_OTHER): Payer: Medicare Other | Admitting: Physician Assistant

## 2023-02-02 DIAGNOSIS — Z9889 Other specified postprocedural states: Secondary | ICD-10-CM

## 2023-02-09 ENCOUNTER — Other Ambulatory Visit: Payer: Self-pay | Admitting: Neurology

## 2023-02-09 ENCOUNTER — Other Ambulatory Visit: Payer: Self-pay | Admitting: Orthopaedic Surgery

## 2023-02-16 ENCOUNTER — Ambulatory Visit (INDEPENDENT_AMBULATORY_CARE_PROVIDER_SITE_OTHER): Payer: Medicare Other | Admitting: Physician Assistant

## 2023-02-16 VITALS — BP 159/92 | HR 94

## 2023-02-16 DIAGNOSIS — Z9889 Other specified postprocedural states: Secondary | ICD-10-CM

## 2023-02-16 NOTE — Progress Notes (Signed)
Patient is a pleasant 57 year old female s/p panniculectomy performed 12/22/2022 by Dr. Ladona Ridgel complicated by infection requiring hospitalization and abdominal wound washout and drain placement 01/14/2023 and hospital discharge 01/17/2023 who presents to clinic for postoperative follow-up.   She was last seen here in clinic on 02/02/2023.  At that time, all of her visible staples were removed without complication or difficulty.  Incision appeared well-healed.  No evidence concerning for infection.  Recommended Vaseline to incision throughout and then transition to silicone scar gels twice daily x 3 months.  Today, patient is doing well.  No specific complaints.  She has been applying Vaseline to her incision and performing mechanical massage, as advised.  She is overall pleased with the outcome of her surgery.  On exam, abdomen is soft, nondistended.  Incision CDI throughout.  Mild dryness throughout.  No incisional wounds noted.  Firmness underneath the area of the incision that was recently reopened for washout and drain placement.  No surrounding erythema or induration otherwise concerning for infection.  The area of firmness is likely due to early scarring.  Recommending frequent mechanical massage as well as application of silicone scar gels twice daily x 3 months.  Suspect it will soften like the remainder of her incision.  If it does not or begins to worsen/scarred down, would encourage her to follow-up with our clinic.  Otherwise, she is doing excellent from a postoperative standpoint and has a good outcome.  She is pleased when reviewing the before-and-after photos.  Follow-up as needed and she can begin increasing activity as tolerated.  Picture(s) obtained of the patient and placed in the chart were with the patient's or guardian's permission.

## 2023-02-23 ENCOUNTER — Telehealth (INDEPENDENT_AMBULATORY_CARE_PROVIDER_SITE_OTHER): Payer: Medicare Other | Admitting: Family Medicine

## 2023-02-23 DIAGNOSIS — Z91199 Patient's noncompliance with other medical treatment and regimen due to unspecified reason: Secondary | ICD-10-CM

## 2023-02-23 NOTE — Progress Notes (Signed)
Patient was not seen for VV due to no phone contact prior to pt appt given two unanswered phone attempts by CMA and no online performance during the VV time slot and 10 mins following.  No charge was associated with missed appt.  Jacky Kindle, FNP  [facility] [address] [place], [place] [address] [phone] (phone) [phone] (fax) [facility]

## 2023-03-16 ENCOUNTER — Other Ambulatory Visit: Payer: Self-pay | Admitting: Neurology

## 2023-03-16 ENCOUNTER — Other Ambulatory Visit: Payer: Self-pay | Admitting: Orthopaedic Surgery

## 2023-03-25 ENCOUNTER — Other Ambulatory Visit: Payer: Self-pay | Admitting: Neurology

## 2023-03-25 DIAGNOSIS — F39 Unspecified mood [affective] disorder: Secondary | ICD-10-CM

## 2023-05-04 ENCOUNTER — Telehealth: Payer: Self-pay | Admitting: Family Medicine

## 2023-05-04 ENCOUNTER — Other Ambulatory Visit: Payer: Self-pay

## 2023-05-04 DIAGNOSIS — R739 Hyperglycemia, unspecified: Secondary | ICD-10-CM

## 2023-05-04 NOTE — Telephone Encounter (Signed)
 [facility] faxed refill request for the following medications:   metFORMIN (GLUCOPHAGE-XR) 750 MG 24 hr tablet     Please advise.

## 2023-05-05 MED ORDER — METFORMIN HCL ER 750 MG PO TB24: 750.0000 mg | ORAL_TABLET | Freq: Two times a day (BID) | ORAL | 0 refills | Status: DC

## 2023-05-20 ENCOUNTER — Other Ambulatory Visit: Payer: Self-pay | Admitting: Orthopaedic Surgery

## 2023-06-03 ENCOUNTER — Telehealth: Admitting: Family Medicine

## 2023-06-03 DIAGNOSIS — J019 Acute sinusitis, unspecified: Secondary | ICD-10-CM

## 2023-06-03 DIAGNOSIS — B9689 Other specified bacterial agents as the cause of diseases classified elsewhere: Secondary | ICD-10-CM

## 2023-06-03 MED ORDER — AMOXICILLIN-POT CLAVULANATE 875-125 MG PO TABS: 1.0000 | ORAL_TABLET | Freq: Two times a day (BID) | ORAL | 0 refills | Status: AC

## 2023-06-03 NOTE — Progress Notes (Signed)

## 2023-06-26 ENCOUNTER — Ambulatory Visit: Admitting: Family Medicine

## 2023-06-29 ENCOUNTER — Ambulatory Visit: Admitting: Family Medicine

## 2023-08-06 ENCOUNTER — Other Ambulatory Visit: Payer: Self-pay | Admitting: Family Medicine

## 2023-08-06 DIAGNOSIS — R739 Hyperglycemia, unspecified: Secondary | ICD-10-CM

## 2023-08-28 ENCOUNTER — Telehealth: Admitting: Physician Assistant

## 2023-08-28 DIAGNOSIS — J208 Acute bronchitis due to other specified organisms: Secondary | ICD-10-CM

## 2023-08-28 DIAGNOSIS — J4489 Other specified chronic obstructive pulmonary disease: Secondary | ICD-10-CM | POA: Diagnosis not present

## 2023-08-28 DIAGNOSIS — B9689 Other specified bacterial agents as the cause of diseases classified elsewhere: Secondary | ICD-10-CM

## 2023-08-28 MED ORDER — BENZONATATE 100 MG PO CAPS: 100.0000 mg | ORAL_CAPSULE | Freq: Three times a day (TID) | ORAL | 0 refills | Status: DC | PRN

## 2023-08-28 MED ORDER — AZITHROMYCIN 250 MG PO TABS: ORAL_TABLET | ORAL | 0 refills | Status: AC

## 2023-08-28 NOTE — Progress Notes (Signed)

## 2023-08-30 ENCOUNTER — Other Ambulatory Visit: Payer: Self-pay | Admitting: Orthopaedic Surgery

## 2023-09-28 ENCOUNTER — Telehealth: Payer: Self-pay | Admitting: Family Medicine

## 2023-09-28 ENCOUNTER — Other Ambulatory Visit: Payer: Self-pay

## 2023-09-28 DIAGNOSIS — F39 Unspecified mood [affective] disorder: Secondary | ICD-10-CM

## 2023-09-28 NOTE — Telephone Encounter (Signed)
Converted to refill request 

## 2023-09-28 NOTE — Telephone Encounter (Signed)
[facility] faxed refill request for the following medications:  DULoxetine (CYMBALTA) 60 MG capsule   Please advise.

## 2023-09-30 NOTE — Telephone Encounter (Signed)
 Error

## 2023-10-01 LAB — COLOGUARD: COLOGUARD: NEGATIVE

## 2023-12-15 ENCOUNTER — Telehealth: Admitting: Physician Assistant

## 2023-12-15 DIAGNOSIS — J4521 Mild intermittent asthma with (acute) exacerbation: Secondary | ICD-10-CM | POA: Diagnosis not present

## 2023-12-15 MED ORDER — ALBUTEROL SULFATE HFA 108 (90 BASE) MCG/ACT IN AERS: 2.0000 | INHALATION_SPRAY | Freq: Four times a day (QID) | RESPIRATORY_TRACT | 0 refills | Status: AC | PRN

## 2023-12-15 NOTE — Progress Notes (Signed)
 E Visit for Asthma  Based on what you have shared with me, it looks like you may have a flare up of your asthma.  Asthma is a chronic (ongoing) lung disease which results in airway obstruction, inflammation and hyper-responsiveness.   Asthma symptoms vary from person to person, with common symptoms including nighttime awakening and decreased ability to participate in normal activities as a result of shortness of breath. It is often triggered by changes in weather, changes in the season, changes in air temperature, or inside (home, school, daycare or work) allergens such as animal dander, mold, mildew, woodstoves or cockroaches.   It can also be triggered by hormonal changes, extreme emotion, physical exertion or an upper respiratory tract illness.     It is important to identify the trigger, and then eliminate or avoid the trigger if possible.   If you have been prescribed medications to be taken on a regular basis, it is important to follow the asthma action plan and to follow guidelines to adjust medication in response to increasing symptoms of decreased peak expiratory flow rate  Treatment: I have prescribed: Albuterol  (Proventil  HFA; Ventolin  HFA) 108 (90 Base) MCG/ACT Inhaler 2 puffs into the lungs every six hours as needed for wheezing or shortness of breath.  HOME CARE Only take medications as instructed by your medical team. Consider wearing a mask or scarf to improve breathing air temperature have been shown to decrease irritation and decrease exacerbations Get rest. Taking a steamy shower or using a humidifier may help nasal congestion sand ease sore throat pain. You can place a towel over your head and breathe in the steam from hot water coming from a faucet. Using a saline nasal spray works much the same way.  Cough drops, hare candies and sore throat lozenges may ease your  cough.  Avoid close contacts especially the very you and the elderly Cover your mouth if you cough or sneeze Always remember to wash your hands.    GET HELP RIGHT AWAY IF: You develop worsening symptoms; breathlessness at rest, drowsy, confused or agitated, unable to speak in full sentences You have coughing fits You develop a severe headache or visual changes You develop shortness of breath, difficulty breathing or start having chest pain Your symptoms persist after you have completed your treatment plan If your symptoms do not improve within 10 days  MAKE SURE YOU Understand these instructions. Will watch your condition. Will get help right away if you are not doing well or get worse.   Your e-visit answers were reviewed by a board certified advanced clinical practitioner to complete your personal care plan, Depending upon the condition, your plan could have included both over the counter or prescription medications.  Please review your pharmacy choice. Your safety is important to us . If you have drug allergies check your prescription carefully. You can use MyChart to ask questions about today's visit, request a non-urgent call back, or ask for a work or school excuse for 24 hours related to this e-Visit. If it has been greater than 24 hours you will need to follow up with your provider, or enter a new e-Visit to address those concerns.  You will get an e-mail in the next two days asking about your experience. I hope that your e-visit has been valuable and will speed your recovery. Thank you for using e-visits.  I have spent 5 minutes in review of e-visit questionnaire, review and updating patient chart, medical decision making and response to patient.  Elsie Velma Lunger, PA-C

## 2023-12-15 NOTE — Progress Notes (Signed)
 I have spent 5 minutes in review of e-visit questionnaire, review and updating patient chart, medical decision making and response to patient.   Elsie Velma Lunger, PA-C

## 2023-12-29 ENCOUNTER — Encounter: Admitting: Internal Medicine

## 2024-01-18 ENCOUNTER — Encounter: Payer: Self-pay | Admitting: Radiology

## 2024-03-01 ENCOUNTER — Encounter: Admitting: Internal Medicine
